# Patient Record
Sex: Female | Born: 1995
Health system: Southern US, Community
[De-identification: ages and names within clinical notes are randomized; demographics above are authoritative.]

## PROBLEM LIST (undated history)

## (undated) ENCOUNTER — Inpatient Hospital Stay (HOSPITAL_COMMUNITY): Payer: Self-pay

## (undated) DIAGNOSIS — O139 Gestational [pregnancy-induced] hypertension without significant proteinuria, unspecified trimester: Secondary | ICD-10-CM

## (undated) DIAGNOSIS — Z8711 Personal history of peptic ulcer disease: Secondary | ICD-10-CM

## (undated) DIAGNOSIS — T4145XA Adverse effect of unspecified anesthetic, initial encounter: Secondary | ICD-10-CM

## (undated) DIAGNOSIS — F410 Panic disorder [episodic paroxysmal anxiety] without agoraphobia: Secondary | ICD-10-CM

## (undated) DIAGNOSIS — Z8619 Personal history of other infectious and parasitic diseases: Secondary | ICD-10-CM

## (undated) DIAGNOSIS — Z8489 Family history of other specified conditions: Secondary | ICD-10-CM

## (undated) DIAGNOSIS — G589 Mononeuropathy, unspecified: Secondary | ICD-10-CM

## (undated) DIAGNOSIS — F329 Major depressive disorder, single episode, unspecified: Secondary | ICD-10-CM

## (undated) DIAGNOSIS — N809 Endometriosis, unspecified: Secondary | ICD-10-CM

## (undated) DIAGNOSIS — K589 Irritable bowel syndrome without diarrhea: Secondary | ICD-10-CM

## (undated) DIAGNOSIS — T8859XA Other complications of anesthesia, initial encounter: Secondary | ICD-10-CM

## (undated) DIAGNOSIS — M6750 Plica syndrome, unspecified knee: Secondary | ICD-10-CM

## (undated) DIAGNOSIS — Z8742 Personal history of other diseases of the female genital tract: Secondary | ICD-10-CM

## (undated) DIAGNOSIS — Z8719 Personal history of other diseases of the digestive system: Secondary | ICD-10-CM

## (undated) DIAGNOSIS — K56609 Unspecified intestinal obstruction, unspecified as to partial versus complete obstruction: Secondary | ICD-10-CM

## (undated) DIAGNOSIS — F419 Anxiety disorder, unspecified: Secondary | ICD-10-CM

## (undated) DIAGNOSIS — F32A Depression, unspecified: Secondary | ICD-10-CM

## (undated) HISTORY — DX: Irritable bowel syndrome, unspecified: K58.9

## (undated) HISTORY — DX: Personal history of other infectious and parasitic diseases: Z86.19

## (undated) HISTORY — DX: Major depressive disorder, single episode, unspecified: F32.9

## (undated) HISTORY — DX: Anxiety disorder, unspecified: F41.9

## (undated) HISTORY — DX: Depression, unspecified: F32.A

## (undated) HISTORY — DX: Endometriosis, unspecified: N80.9

## (undated) HISTORY — DX: Personal history of peptic ulcer disease: Z87.11

## (undated) HISTORY — DX: Personal history of other diseases of the digestive system: Z87.19

---
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
## 2013-02-20 ENCOUNTER — Encounter: Payer: Self-pay | Admitting: Internal Medicine

## 2013-02-20 ENCOUNTER — Ambulatory Visit (INDEPENDENT_AMBULATORY_CARE_PROVIDER_SITE_OTHER): Payer: 59 | Admitting: Internal Medicine

## 2013-02-20 VITALS — BP 122/76 | HR 85 | Temp 98.2°F | Ht 65.0 in | Wt 146.0 lb

## 2013-02-20 DIAGNOSIS — H6690 Otitis media, unspecified, unspecified ear: Secondary | ICD-10-CM

## 2013-02-20 DIAGNOSIS — H6693 Otitis media, unspecified, bilateral: Secondary | ICD-10-CM

## 2013-02-20 MED ORDER — AMOXICILLIN 500 MG PO CAPS: 500.0000 mg | ORAL_CAPSULE | Freq: Three times a day (TID) | ORAL | Status: DC

## 2013-02-20 NOTE — Progress Notes (Signed)
HPI  Pt presents to the clinic today with c/o ear pain, fever, sore throat and body aches x 3 days. She has taken OTC tylenol without much relief. The symptoms seem to be getting worse each day. 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 She has no history of allergies or asthma.  Review of Systems      Past Medical History  Diagnosis Date  . Hx of ovarian cyst     x 2-3  . Gestational hypertension 01/2012    also had postpartum preelampsia  . Plica of knee 08/2012    medial plica left knee  . Migraine     Family History  Problem Relation Age of Onset  . Hypertension Mother   . Heart disease Mother     MI  . Anesthesia problems Mother     post-op N/V  . Asthma Sister   . Asthma Brother   . Diabetes Maternal Aunt     History   Social History  . Marital Status: Single    Spouse Name: N/A    Number of Children: N/A  . Years of Education: N/A   Occupational History  . Not on file.   Social History Main Topics  . Smoking status: Never Smoker   . Smokeless tobacco: Never Used  . Alcohol Use: No  . Drug Use: No  . Sexually Active: Yes    Birth Control/ Protection: None     Comment: last night   Other Topics Concern  . Not on file   Social History Narrative  . No narrative on file    Allergies  Allergen Reactions  . Cefuroxime Axetil Rash     Constitutional: Positive headache, fatigue and fever. Denies abrupt weight changes.  HEENT:  Positive ear pain and sore throat. Denies eye redness, eye pain, pressure behind the eyes, facial pain, nasal congestion, ringing in the ears, wax buildup, runny nose or bloody nose. Respiratory: Positive cough. Denies difficulty breathing or shortness of breath.  Cardiovascular: Denies chest pain, chest tightness, palpitations or swelling in the hands or feet.   No other specific complaints in a complete review of systems (except as listed in HPI above).  Objective:   BP 122/76   Pulse 85  Temp(Src) 98.2 F (36.8 C) (Oral)  Ht 5\' 5"  (1.651 m)  Wt 146 lb (66.225 kg)  BMI 24.3 kg/m2  SpO2 98% Wt Readings from Last 3 Encounters:  02/20/13 146 lb (66.225 kg) (80%*, Z = 0.86)  12/25/12 146 lb 1.9 oz (66.28 kg) (81%*, Z = 0.88)  08/17/12 147 lb (66.679 kg) (83%*, Z = 0.94)   * Growth percentiles are based on CDC 2-20 Years data.     General: Appears her stated age, well developed, well nourished in NAD. HEENT: Head: normal shape and size; Eyes: sclera white, no icterus, conjunctiva pink, PERRLA and EOMs intact; Ears: Tm's red and bulging, distorted light reflex + effusion, cotton impaction in bilateral ears; Nose: mucosa pink and moist, septum midline; Throat/Mouth: + PND. Teeth present, mucosa erythematous and moist, no exudate noted, no lesions or ulcerations noted.  Neck: Mild tonsillar lymphadenopathy. Neck supple, trachea midline. No massses, lumps or thyromegaly present.  Cardiovascular: Normal rate and rhythm. S1,S2 noted.  No murmur, rubs or gallops noted. No JVD or BLE edema. No carotid bruits noted. Pulmonary/Chest: Normal effort and positive vesicular breath sounds. No respiratory distress. No wheezes, rales or ronchi noted.  Assessment & Plan:   Bilateral Otitis media with effustion, new onset:  Get some rest and drink plenty of water Do salt water gargles for the sore throat eRx for Amoxicillin BID x 10 day Continue tylenol or ibuprofen for fever/body aches  Call me if you need a referral to ENT to have the cotton removed from your ears  RTC as needed or if symptoms persist.

## 2013-02-20 NOTE — Patient Instructions (Signed)

## 2013-04-05 ENCOUNTER — Encounter: Payer: 59 | Admitting: Internal Medicine

## 2013-04-22 ENCOUNTER — Encounter: Payer: Self-pay | Admitting: Internal Medicine

## 2013-04-22 ENCOUNTER — Ambulatory Visit (INDEPENDENT_AMBULATORY_CARE_PROVIDER_SITE_OTHER): Payer: 59 | Admitting: Internal Medicine

## 2013-04-22 ENCOUNTER — Telehealth: Payer: Self-pay | Admitting: Internal Medicine

## 2013-04-22 VITALS — BP 126/62 | HR 87 | Temp 98.4°F

## 2013-04-22 DIAGNOSIS — R350 Frequency of micturition: Secondary | ICD-10-CM

## 2013-04-22 DIAGNOSIS — R3915 Urgency of urination: Secondary | ICD-10-CM

## 2013-04-22 DIAGNOSIS — R300 Dysuria: Secondary | ICD-10-CM

## 2013-04-22 LAB — POCT URINALYSIS DIPSTICK
Glucose, UA: NEGATIVE
Leukocytes, UA: NEGATIVE
Nitrite, UA: NEGATIVE
Urobilinogen, UA: 0.2

## 2013-04-22 MED ORDER — NITROFURANTOIN MONOHYD MACRO 100 MG PO CAPS: 100.0000 mg | ORAL_CAPSULE | Freq: Two times a day (BID) | ORAL | Status: DC

## 2013-04-22 NOTE — Addendum Note (Signed)
Addended by: Brenton Grills C on: 04/22/2013 11:42 AM   Modules accepted: Orders

## 2013-04-22 NOTE — Telephone Encounter (Signed)
Ok with me 

## 2013-04-22 NOTE — Telephone Encounter (Signed)
The patient is hoping to switch her PCP from Dr.John to [facility].  Is this okay?   Pt callback - [phone]

## 2013-04-22 NOTE — Progress Notes (Signed)
HPI  Pt presents to the clinic today with c/o urinary urgency, frequency and dysuria that started 3 days ago. She also has some associated nausea and flank pain. She has taken Azo OTC which has helped with the burning. She denies fever, chills or body aches. She has increased her fluids. She denies vaginal discharge, pain, odor or abnormal uterine bleeding.   Review of Systems  Past Medical History  Diagnosis Date  . Hx of ovarian cyst     x 2-3  . Gestational hypertension 01/2012    also had postpartum preelampsia  . Plica of knee 08/2012    medial plica left knee  . Migraine     Family History  Problem Relation Age of Onset  . Hypertension Mother   . Heart disease Mother     MI  . Anesthesia problems Mother     post-op N/V  . Asthma Sister   . Asthma Brother   . Diabetes Maternal Aunt     History   Social History  . Marital Status: Single    Spouse Name: N/A    Number of Children: N/A  . Years of Education: N/A   Occupational History  . Not on file.   Social History Main Topics  . Smoking status: Never Smoker   . Smokeless tobacco: Never Used  . Alcohol Use: No  . Drug Use: No  . Sexually Active: Yes    Birth Control/ Protection: None     Comment: last night   Other Topics Concern  . Not on file   Social History Narrative  . No narrative on file    Allergies  Allergen Reactions  . Cefuroxime Axetil Rash    Constitutional: Denies fever, malaise, fatigue, headache or abrupt weight changes.   GU: Pt reports urgency, frequency and pain with urination. Denies burning sensation, blood in urine, odor or discharge. Skin: Denies redness, rashes, lesions or ulcercations.   No other specific complaints in a complete review of systems (except as listed in HPI above).    Objective:   Physical Exam  BP 126/62  Pulse 87  Temp(Src) 98.4 F (36.9 C) (Oral)  SpO2 97%  LMP 04/06/2013 Wt Readings from Last 3 Encounters:  02/20/13 146 lb (66.225 kg) (80%*, Z  = 0.86)  12/25/12 146 lb 1.9 oz (66.28 kg) (81%*, Z = 0.88)  08/17/12 147 lb (66.679 kg) (83%*, Z = 0.94)   * Growth percentiles are based on CDC 2-20 Years data.    General: Appears her stated age, well developed, well nourished in NAD. Cardiovascular: Normal rate and rhythm. S1,S2 noted.  No murmur, rubs or gallops noted. No JVD or BLE edema. No carotid bruits noted. Pulmonary/Chest: Normal effort and positive vesicular breath sounds. No respiratory distress. No wheezes, rales or ronchi noted.  Abdomen: Soft and nontender. Normal bowel sounds, no bruits noted. No distention or masses noted. Liver, spleen and kidneys non palpable. Tender to palpation over the bladder area. No CVA tenderness.      Assessment & Plan:   Urinary urgency, frequency and dysuria, secondary to cystitis  Urinalysis and urine culture eRx sent if for Macrobid 100 mg BID x 5 days eRx sent in for Pyridium 200 mg TID prn Drink plenty of fluids  RTC as needed or if symptoms persist.

## 2013-04-22 NOTE — Patient Instructions (Signed)
Urinary Tract Infection Urinary tract infections (UTIs) can develop anywhere along your urinary tract. Your urinary tract is your body's drainage system for removing wastes and extra water. Your urinary tract includes two kidneys, two ureters, a bladder, and a urethra. Your kidneys are a pair of bean-shaped organs. Each kidney is about the size of your fist. They are located below your ribs, one on each side of your spine. CAUSES Infections are caused by microbes, which are microscopic organisms, including fungi, viruses, and bacteria. These organisms are so small that they can only be seen through a microscope. Bacteria are the microbes that most commonly cause UTIs. SYMPTOMS  Symptoms of UTIs may vary by age and gender of the patient and by the location of the infection. Symptoms in young women typically include a frequent and intense urge to urinate and a painful, burning feeling in the bladder or urethra during urination. Older women and men are more likely to be tired, shaky, and weak and have muscle aches and abdominal pain. A fever may mean the infection is in your kidneys. Other symptoms of a kidney infection include pain in your back or sides below the ribs, nausea, and vomiting. DIAGNOSIS To diagnose a UTI, your caregiver will ask you about your symptoms. Your caregiver also will ask to provide a urine sample. The urine sample will be tested for bacteria and white blood cells. White blood cells are made by your body to help fight infection. TREATMENT  Typically, UTIs can be treated with medication. Because most UTIs are caused by a bacterial infection, they usually can be treated with the use of antibiotics. The choice of antibiotic and length of treatment depend on your symptoms and the type of bacteria causing your infection. HOME CARE INSTRUCTIONS  If you were prescribed antibiotics, take them exactly as your caregiver instructs you. Finish the medication even if you feel better after you  have only taken some of the medication.  Drink enough water and fluids to keep your urine clear or pale yellow.  Avoid caffeine, tea, and carbonated beverages. They tend to irritate your bladder.  Empty your bladder often. Avoid holding urine for long periods of time.  Empty your bladder before and after sexual intercourse.  After a bowel movement, women should cleanse from front to back. Use each tissue only once. SEEK MEDICAL CARE IF:   You have back pain.  You develop a fever.  Your symptoms do not begin to resolve within 3 days. SEEK IMMEDIATE MEDICAL CARE IF:   You have severe back pain or lower abdominal pain.  You develop chills.  You have nausea or vomiting.  You have continued burning or discomfort with urination. MAKE SURE YOU:   Understand these instructions.  Will watch your condition.  Will get help right away if you are not doing well or get worse. Document Released: 08/31/2005 Document Revised: 05/22/2012 Document Reviewed: 12/30/2011 [employer] Patient Information 2013 [employer].  

## 2013-04-25 ENCOUNTER — Encounter (HOSPITAL_COMMUNITY): Payer: Self-pay | Admitting: *Deleted

## 2013-04-25 ENCOUNTER — Emergency Department (HOSPITAL_COMMUNITY)
Admission: EM | Admit: 2013-04-25 | Discharge: 2013-04-25 | Disposition: A | Discharge status: Home / Self Care | Payer: 59 | Source: Home / Self Care | Attending: Emergency Medicine | Admitting: Emergency Medicine

## 2013-04-25 DIAGNOSIS — H61899 Other specified disorders of external ear, unspecified ear: Secondary | ICD-10-CM

## 2013-04-25 DIAGNOSIS — H61892 Other specified disorders of left external ear: Secondary | ICD-10-CM

## 2013-04-25 MED ORDER — HYDROCODONE-ACETAMINOPHEN 5-325 MG PO TABS
ORAL_TABLET | ORAL | Status: AC
  Filled 2013-04-25: qty 1

## 2013-04-25 MED ORDER — AMOXICILLIN-POT CLAVULANATE 875-125 MG PO TABS: 1.0000 | ORAL_TABLET | Freq: Two times a day (BID) | ORAL | Status: DC

## 2013-04-25 MED ORDER — HYDROCODONE-ACETAMINOPHEN 5-325 MG PO TABS
1.0000 | ORAL_TABLET | Freq: Once | ORAL | Status: AC
  Administered 2013-04-25: 1 via ORAL

## 2013-04-25 MED ORDER — HYDROCODONE-ACETAMINOPHEN 5-325 MG PO TABS: ORAL_TABLET | ORAL | Status: DC

## 2013-04-25 MED ORDER — IBUPROFEN 800 MG PO TABS
800.0000 mg | ORAL_TABLET | Freq: Once | ORAL | Status: AC
  Administered 2013-04-25: 800 mg via ORAL

## 2013-04-25 MED ORDER — ANTIPYRINE-BENZOCAINE 5.4-1.4 % OT SOLN: 3.0000 [drp] | OTIC | Status: DC | PRN

## 2013-04-25 MED ORDER — IBUPROFEN 800 MG PO TABS
ORAL_TABLET | ORAL | Status: AC
  Filled 2013-04-25: qty 1

## 2013-04-25 NOTE — ED Provider Notes (Signed)
Chief Complaint:   Chief Complaint  Patient presents with  . Ear Laceration    History of Present Illness:   [name] Sanchez is an 17 year old female who's infant son stuck a plastic hair headband into her left ear around 4:30 this afternoon. The ear canal blood little bit, but the bleeding has now stopped. Her hearing is normal and she has pain in the ear canal which is worse with any touching or movement of the external ear. She denies fever, chills, nasal congestion, rhinorrhea, or sore throat.  Review of Systems:  Other than noted above, the patient denies any of the following symptoms: Systemic:  No fevers, chills, sweats, weight loss or gain, fatigue, or tiredness. Eye:  No redness, pain, discharge, itching, blurred vision, or diplopia. ENT:  No headache, nasal congestion, sneezing, itching, epistaxis, ear pain, congestion, decreased hearing, ringing in ears, vertigo, or tinnitus.  No oral lesions, sore throat, pain on swallowing, or hoarseness. Neck:  No mass, tenderness or adenopathy. Lungs:  No coughing, wheezing, or shortness of breath. Skin:  No rash or itching.  PMFSH:  Past medical history, family history, social history, meds, and allergies were reviewed. She is allergic to Ceftin. She is on birth control pills and nitrofurantoin for urinary tract infection.  Physical Exam:   Vital signs:  BP 142/102  Pulse 91  Temp(Src) 98.2 F (36.8 C) (Oral)  Resp 20  SpO2 100%  LMP 04/06/2013 General:  Alert and oriented.  In no distress.  Skin warm and dry. Eye:  PERRL, full EOMs, lids and conjunctiva normal.   ENT:  The external ear was normal. She has some crusted blood in the external ear canal inferiorly. The ear was very tender to touch I did not attempt to irrigate the ear since this would cause her severe pain. The tympanic membrane was intact. The right ear canal and TM are normal.  Nasal mucosa not congested and without drainage.  Mucous membranes moist, no oral lesions, normal  dentition, pharynx clear.  No cranial or facial pain to palplation. Neck:  Supple, full ROM.  No adenopathy, tenderness or mass.  Thyroid normal. Lungs:  Breath sounds clear and equal bilaterally.  No wheezes, rales or rhonchi. Heart:  Rhythm regular, without extrasystoles.  No gallops or murmers. Skin:  Clear, warm and dry.   Course in Urgent Care Center:   3 drops of Auralgan was instilled into the ear canal and a cotton ball was put in place.  Assessment:  The encounter diagnosis was Irritation of external ear canal, left.  No evidence of injury to the tympanic membrane. This appears to be a small scratch to the external ear. Should heal up well, but infection is always a possibility.  Plan:   1.  The following meds were prescribed:   New Prescriptions   AMOXICILLIN-CLAVULANATE (AUGMENTIN) 875-125 MG PER TABLET    Take 1 tablet by mouth 2 (two) times daily.   ANTIPYRINE-BENZOCAINE (AURALGAN) OTIC SOLUTION    Place 3 drops into the left ear every 2 (two) hours as needed for pain.   HYDROCODONE-ACETAMINOPHEN (NORCO/VICODIN) 5-325 MG PER TABLET    1 to 2 tabs every 4 to 6 hours as needed for pain.   2.  The patient was instructed in symptomatic care and handouts were given. She was instructed to instill 3 drops of half strength peroxide and warm water into the ear, followed by warm water flush to the ear canal. She should not swim for about the next week  and return in 3 or 4 days if no improvement in the pain. 3.  The patient was told to return if becoming worse in any way, if no better in 3 or 4 days, and given some red flag symptoms such as worsening pain, fever, or difficulty hearing that would indicate earlier return. 4.  Follow up here if no better in 3 or 4 days.    Reuben Likes, MD 04/25/13 [phone]

## 2013-04-25 NOTE — ED Notes (Signed)
Pt   Reports  Her    Child    Accidentally    Pushed the  Sharp      Portion        Of a  Headband  Into    Her l  Ear  Causing a  Lac with  Blood  Present in the  Canal   -  Pt  denys  Any loss  Of  Hearing in the  Affected  Ear

## 2013-05-22 ENCOUNTER — Encounter: Payer: Self-pay | Admitting: *Deleted

## 2013-05-22 ENCOUNTER — Encounter: Payer: Self-pay | Admitting: Internal Medicine

## 2013-05-22 ENCOUNTER — Ambulatory Visit (INDEPENDENT_AMBULATORY_CARE_PROVIDER_SITE_OTHER): Payer: 59 | Admitting: Internal Medicine

## 2013-05-22 VITALS — BP 122/72 | HR 78 | Temp 98.0°F

## 2013-05-22 DIAGNOSIS — J309 Allergic rhinitis, unspecified: Secondary | ICD-10-CM

## 2013-05-22 DIAGNOSIS — L301 Dyshidrosis [pompholyx]: Secondary | ICD-10-CM

## 2013-05-22 MED ORDER — TRIAMCINOLONE ACETONIDE 0.1 % EX CREA: TOPICAL_CREAM | Freq: Two times a day (BID) | CUTANEOUS | Status: DC

## 2013-05-22 MED ORDER — METHYLPREDNISOLONE ACETATE 80 MG/ML IJ SUSP
80.0000 mg | Freq: Once | INTRAMUSCULAR | Status: AC
  Administered 2013-05-22: 80 mg via INTRAMUSCULAR

## 2013-05-22 NOTE — Progress Notes (Signed)
HPI  Pt presents to the clinic today with c/o coughing, sore throat, fatigue, headache, nasal congestion, sinus pain and pressure. This is a recurrent issue for her. She has had sinus issues in the past. This started 2 days ago. She has taken Zyrtec and Advil sinus. She is having green nasal drainage and green sputum production. She does have a history of allergies but not asthma. She has not had sick contacts.  Review of Systems    Past Medical History  Diagnosis Date  . Hx of ovarian cyst     x 2-3  . Gestational hypertension 01/2012    also had postpartum preelampsia  . Plica of knee 08/2012    medial plica left knee  . Migraine     Family History  Problem Relation Age of Onset  . Hypertension Mother   . Heart disease Mother     MI  . Anesthesia problems Mother     post-op N/V  . Asthma Sister   . Asthma Brother   . Diabetes Maternal Aunt     History   Social History  . Marital Status: Single    Spouse Name: N/A    Number of Children: N/A  . Years of Education: N/A   Occupational History  . Not on file.   Social History Main Topics  . Smoking status: Never Smoker   . Smokeless tobacco: Never Used  . Alcohol Use: No  . Drug Use: No  . Sexually Active: Yes    Birth Control/ Protection: None     Comment: last night   Other Topics Concern  . Not on file   Social History Narrative  . No narrative on file    Allergies  Allergen Reactions  . Cefuroxime Axetil Rash     Constitutional: Positive headache, fatigue. Denies fever or abrupt weight changes.  Skin: pt reports dry skin and cracking of hands. HEENT:  Positive eye pain, pressure behind the eyes, facial pain, nasal congestion and sore throat. Denies eye redness, ear pain, ringing in the ears, wax buildup, runny nose or bloody nose. Respiratory: Positive cough and thick green sputum production. Denies difficulty breathing or shortness of breath.  Cardiovascular: Denies chest pain, chest tightness,  palpitations or swelling in the hands or feet.   No other specific complaints in a complete review of systems (except as listed in HPI above).  Objective:    BP 122/72  Pulse 78  Temp(Src) 98 F (36.7 C) (Oral)  SpO2 97%  LMP 04/06/2013 Wt Readings from Last 3 Encounters:  02/20/13 146 lb (66.225 kg) (80%*, Z = 0.86)  12/25/12 146 lb 1.9 oz (66.28 kg) (81%*, Z = 0.88)  08/17/12 147 lb (66.679 kg) (83%*, Z = 0.94)   * Growth percentiles are based on CDC 2-20 Years data.    General: Appears her stated age, well developed, well nourished in NAD. Skin: eczema noted in between digits on bilateral hands. HEENT: Head: normal shape and size; Eyes: sclera white, no icterus, conjunctiva pink, PERRLA and EOMs intact; Ears: Tm's gray and intact, normal light reflex; Nose: mucosa pink and moist, septum midline; Throat/Mouth: + PND. Teeth present, mucosa pink and moist, no exudate noted, no lesions or ulcerations noted.  Neck: Mild cervical lymphadenopathy. Neck supple, trachea midline. No massses, lumps or thyromegaly present.  Cardiovascular: Normal rate and rhythm. S1,S2 noted.  No murmur, rubs or gallops noted. No JVD or BLE edema. No carotid bruits noted. Pulmonary/Chest: Normal effort and positive vesicular breath sounds.  No respiratory distress. No wheezes, rales or ronchi noted.      Assessment & Plan:   Likely allergic rhinitis  Continue zyrtec Will give 80 mg Depo IM Reassurance given that this will likely resolve  Dishydrotic eczema of hands, new onset:  eRx for triamcinolone cream F/u with dermatology  RTC as needed or if symptoms persist.

## 2013-05-22 NOTE — Patient Instructions (Signed)
Allergic Rhinitis Allergic rhinitis is when the mucous membranes in the nose respond to allergens. Allergens are particles in the air that cause your body to have an allergic reaction. This causes you to release allergic antibodies. Through a chain of events, these eventually cause you to release histamine into the blood stream (hence the use of antihistamines). Although meant to be protective to the body, it is this release that causes your discomfort, such as frequent sneezing, congestion and an itchy runny nose.  CAUSES  The pollen allergens may come from grasses, trees, and weeds. This is seasonal allergic rhinitis, or "hay fever." Other allergens cause year-round allergic rhinitis (perennial allergic rhinitis) such as house dust mite allergen, pet dander and mold spores.  SYMPTOMS   Nasal stuffiness (congestion).  Runny, itchy nose with sneezing and tearing of the eyes.  There is often an itching of the mouth, eyes and ears. It cannot be cured, but it can be controlled with medications. DIAGNOSIS  If you are unable to determine the offending allergen, skin or blood testing may find it. TREATMENT   Avoid the allergen.  Medications and allergy shots (immunotherapy) can help.  Hay fever may often be treated with antihistamines in pill or nasal spray forms. Antihistamines block the effects of histamine. There are over-the-counter medicines that may help with nasal congestion and swelling around the eyes. Check with your caregiver before taking or giving this medicine. If the treatment above does not work, there are many new medications your caregiver can prescribe. Stronger medications may be used if initial measures are ineffective. Desensitizing injections can be used if medications and avoidance fails. Desensitization is when a patient is given ongoing shots until the body becomes less sensitive to the allergen. Make sure you follow up with your caregiver if problems continue. SEEK MEDICAL  CARE IF:   You develop fever (more than 100.5 F (38.1 C).  You develop a cough that does not stop easily (persistent).  You have shortness of breath.  You start wheezing.  Symptoms interfere with normal daily activities. Document Released: 08/16/2001 Document Revised: 02/13/2012 Document Reviewed: 02/25/2009 [employer] Patient Information 2014 [employer].  

## 2013-05-22 NOTE — Addendum Note (Signed)
Addended by: Carin Primrose on: 05/22/2013 04:29 PM   Modules accepted: Orders

## 2013-06-27 ENCOUNTER — Telehealth: Payer: Self-pay | Admitting: *Deleted

## 2013-06-27 ENCOUNTER — Ambulatory Visit (INDEPENDENT_AMBULATORY_CARE_PROVIDER_SITE_OTHER): Payer: 59 | Admitting: Internal Medicine

## 2013-06-27 ENCOUNTER — Encounter: Payer: Self-pay | Admitting: Internal Medicine

## 2013-06-27 VITALS — BP 136/86 | HR 98 | Temp 98.4°F | Wt 138.0 lb

## 2013-06-27 DIAGNOSIS — L301 Dyshidrosis [pompholyx]: Secondary | ICD-10-CM

## 2013-06-27 MED ORDER — FLUOCINOLONE ACETONIDE 0.01 % EX CREA: TOPICAL_CREAM | Freq: Two times a day (BID) | CUTANEOUS | Status: DC

## 2013-06-27 NOTE — Patient Instructions (Signed)

## 2013-06-27 NOTE — Telephone Encounter (Signed)
Monica, pharmacist called requesting clarification on medication.  Rx written for Fluocinolone; Rx intended Fluocinonide (Vanos).

## 2013-06-27 NOTE — Progress Notes (Signed)
Subjective:    Patient ID: [name] Sanchez, female    DOB: 1996-05-01, 17 y.o.   MRN: [id number]  HPI  Pt presents to the clinic today with c/o right hand rash that itches and burns. This started about 2 months ago. She was seen by a dermatologist for this. They told her she was allergic to water. They did not offer her any treatment for this. She does work in a nursing home. She does wear vinyl non powdered non latex gloves. She does wash her hands frequently. Sometimes, they get so dry, they open up. She is concerned that she is going to get an infection. She has previously tried triamcinolone cream on it but that makes it burn even worse.  Review of Systems      Past Medical History  Diagnosis Date  . Hx of ovarian cyst     x 2-3  . Gestational hypertension 01/2012    also had postpartum preelampsia  . Plica of knee 08/2012    medial plica left knee  . Migraine     Current Outpatient Prescriptions  Medication Sig Dispense Refill  . norgestrel-ethinyl estradiol (LO/OVRAL,CRYSELLE) 0.3-30 MG-MCG tablet Take 1 tablet by mouth daily.      . pantoprazole (PROTONIX) 40 MG tablet Take 1 tablet (40 mg total) by mouth daily.  30 tablet  3  . triamcinolone cream (KENALOG) 0.1 % Apply topically 2 (two) times daily.  30 g  0   No current facility-administered medications for this visit.    Allergies  Allergen Reactions  . Cefuroxime Axetil Rash    Family History  Problem Relation Age of Onset  . Hypertension Mother   . Heart disease Mother     MI  . Anesthesia problems Mother     post-op N/V  . Asthma Sister   . Asthma Brother   . Diabetes Maternal Aunt     History   Social History  . Marital Status: Single    Spouse Name: N/A    Number of Children: N/A  . Years of Education: N/A   Occupational History  . Not on file.   Social History Main Topics  . Smoking status: Never Smoker   . Smokeless tobacco: Never Used  . Alcohol Use: No  . Drug Use: No  . Sexually Active:  Yes    Birth Control/ Protection: None     Comment: last night   Other Topics Concern  . Not on file   Social History Narrative  . No narrative on file     Constitutional: Denies fever, malaise, fatigue, headache or abrupt weight changes.  Musculoskeletal:  Denies decrease in range of motion, difficulty with gait, muscle pain or joint pain and swelling.  Skin: Pt reports dry cracked skin on her right hand. Denies lesions or ulcercations.    No other specific complaints in a complete review of systems (except as listed in HPI above).  Objective:   Physical Exam   BP 136/86  Pulse 98  Temp(Src) 98.4 F (36.9 C) (Oral)  Wt 138 lb (62.596 kg)  BMI 22.96 kg/m2  SpO2 98% Wt Readings from Last 3 Encounters:  06/27/13 138 lb (62.596 kg) (71%*, Z = 0.55)  02/20/13 146 lb (66.225 kg) (80%*, Z = 0.86)  12/25/12 146 lb 1.9 oz (66.28 kg) (81%*, Z = 0.88)   * Growth percentiles are based on CDC 2-20 Years data.    General: Appears her stated age, well developed, well nourished in NAD. Skin: Warm,  dry and intact. Dyshidrotic eczema noted on right hand. Cardiovascular: Normal rate and rhythm. S1,S2 noted.  No murmur, rubs or gallops noted. No JVD or BLE edema. No carotid bruits noted. Pulmonary/Chest: Normal effort and positive vesicular breath sounds. No respiratory distress. No wheezes, rales or ronchi noted.     BMET    Component Value Date/Time   NA 136 12/25/2012 1420   K 3.9 12/25/2012 1420   CL 105 12/25/2012 1420   CO2 26 12/25/2012 1420   GLUCOSE 88 12/25/2012 1420   BUN 8 12/25/2012 1420   CREATININE 0.7 12/25/2012 1420   CALCIUM 9.3 12/25/2012 1420   GFRNONAA NOT CALCULATED 01/25/2012 0540   GFRAA NOT CALCULATED 01/25/2012 0540    Lipid Panel     Component Value Date/Time   CHOL 174 04/04/2012 1410   TRIG 62.0 04/04/2012 1410   HDL 58.30 04/04/2012 1410   CHOLHDL 3 04/04/2012 1410   VLDL 12.4 04/04/2012 1410   LDLCALC 103* 04/04/2012 1410    CBC    Component Value  Date/Time   WBC 6.2 12/25/2012 1420   RBC 4.34 12/25/2012 1420   HGB 13.6 12/25/2012 1420   HCT 39.1 12/25/2012 1420   PLT 214.0 12/25/2012 1420   MCV 90.2 12/25/2012 1420   MCH 32.4 01/24/2012 0510   MCHC 34.8 12/25/2012 1420   RDW 12.2 12/25/2012 1420   LYMPHSABS 2.0 04/04/2012 1410   MONOABS 0.4 04/04/2012 1410   EOSABS 0.1 04/04/2012 1410   BASOSABS 0.0 04/04/2012 1410    Hgb A1C No results found for this basename: HGBA1C        Assessment & Plan:   Dyshidrotic eczema right hand:  Avoid washing with hot water Use your own soap for sensitive skin while at work- no fragrances eRx for fluocinolone cream BID x 4 weeks Keep your hand in a glove  RTC as needed or if symptoms persist or worsen or if you notice any s/s of infection

## 2013-09-26 ENCOUNTER — Ambulatory Visit (INDEPENDENT_AMBULATORY_CARE_PROVIDER_SITE_OTHER): Payer: 59 | Admitting: Internal Medicine

## 2013-09-26 ENCOUNTER — Encounter: Payer: Self-pay | Admitting: Internal Medicine

## 2013-09-26 VITALS — BP 110/78 | HR 86 | Temp 97.8°F | Wt 143.4 lb

## 2013-09-26 DIAGNOSIS — H6692 Otitis media, unspecified, left ear: Secondary | ICD-10-CM

## 2013-09-26 DIAGNOSIS — H6690 Otitis media, unspecified, unspecified ear: Secondary | ICD-10-CM

## 2013-09-26 MED ORDER — AMOXICILLIN 500 MG PO CAPS: 500.0000 mg | ORAL_CAPSULE | Freq: Three times a day (TID) | ORAL | Status: DC

## 2013-09-26 NOTE — Progress Notes (Signed)
Subjective:    Patient ID: [name] Sanchez, female    DOB: [birth year], 17 y.o.   MRN: [id number]  HPI  Pt presents to the clinic today with c/o left ear pain. This started about 3 days ago. She describes the pain as sharp and stabbing. She denies fever, chills or body aches. She has taken BC powder and Claritin.  Review of Systems      Past Medical History  Diagnosis Date  . Hx of ovarian cyst     x 2-3  . Gestational hypertension 01/2012    also had postpartum preelampsia  . Plica of knee 08/2012    medial plica left knee  . Migraine     No current outpatient prescriptions on file.   No current facility-administered medications for this visit.    Allergies  Allergen Reactions  . Cefuroxime Axetil Rash    Family History  Problem Relation Age of Onset  . Hypertension Mother   . Heart disease Mother     MI  . Anesthesia problems Mother     post-op N/V  . Asthma Sister   . Asthma Brother   . Diabetes Maternal Aunt     History   Social History  . Marital Status: Single    Spouse Name: N/A    Number of Children: N/A  . Years of Education: N/A   Occupational History  . Not on file.   Social History Main Topics  . Smoking status: Never Smoker   . Smokeless tobacco: Never Used  . Alcohol Use: No  . Drug Use: No  . Sexual Activity: Yes    Birth Control/ Protection: None     Comment: last night   Other Topics Concern  . Not on file   Social History Narrative  . No narrative on file     Constitutional: Denies fever, malaise, fatigue, headache or abrupt weight changes.  HEENT: Pt reports left ear pain. Denies eye pain, eye redness, ringing in the ears, wax buildup, runny nose, nasal congestion, bloody nose, or sore throat.   No other specific complaints in a complete review of systems (except as listed in HPI above).  Objective:   Physical Exam   BP 110/78  Pulse 86  Temp(Src) 97.8 F (36.6 C) (Oral)  Wt 143 lb 6.4 oz (65.046 kg)  BMI 23.86  kg/m2  SpO2 99% Wt Readings from Last 3 Encounters:  09/26/13 143 lb 6.4 oz (65.046 kg) (76%*, Z = 0.71)  06/27/13 138 lb (62.596 kg) (71%*, Z = 0.55)  02/20/13 146 lb (66.225 kg) (80%*, Z = 0.86)   * Growth percentiles are based on CDC 2-20 Years data.    General: Appears her stated age, well developed, well nourished in NAD. HEENT: Head: normal shape and size; Eyes: sclera white, no icterus, conjunctiva pink, PERRLA and EOMs intact; Left Ears: Tm's red but intact, distorted light reflex, + effusion; Nose: mucosa pink and moist, septum midline; Throat/Mouth: Teeth present, mucosa pink and moist, no exudate, lesions or ulcerations noted Cardiovascular: Normal rate and rhythm. S1,S2 noted.  No murmur, rubs or gallops noted. No JVD or BLE edema. No carotid bruits noted. Pulmonary/Chest: Normal effort and positive vesicular breath sounds. No respiratory distress. No wheezes, rales or ronchi noted.    BMET    Component Value Date/Time   NA 136 12/25/2012 1420   K 3.9 12/25/2012 1420   CL 105 12/25/2012 1420   CO2 26 12/25/2012 1420   GLUCOSE 88 12/25/2012 1420  BUN 8 12/25/2012 1420   CREATININE 0.7 12/25/2012 1420   CALCIUM 9.3 12/25/2012 1420   GFRNONAA NOT CALCULATED 01/25/2012 0540   GFRAA NOT CALCULATED 01/25/2012 0540    Lipid Panel     Component Value Date/Time   CHOL 174 04/04/2012 1410   TRIG 62.0 04/04/2012 1410   HDL 58.30 04/04/2012 1410   CHOLHDL 3 04/04/2012 1410   VLDL 12.4 04/04/2012 1410   LDLCALC 103* 04/04/2012 1410    CBC    Component Value Date/Time   WBC 6.2 12/25/2012 1420   RBC 4.34 12/25/2012 1420   HGB 13.6 12/25/2012 1420   HCT 39.1 12/25/2012 1420   PLT 214.0 12/25/2012 1420   MCV 90.2 12/25/2012 1420   MCH 32.4 01/24/2012 0510   MCHC 34.8 12/25/2012 1420   RDW 12.2 12/25/2012 1420   LYMPHSABS 2.0 04/04/2012 1410   MONOABS 0.4 04/04/2012 1410   EOSABS 0.1 04/04/2012 1410   BASOSABS 0.0 04/04/2012 1410    Hgb A1C No results found for this basename: HGBA1C         Assessment & Plan:  Otalgia left ear, secondary to otitis media:  eRx for Amoxicillin TID x 10 days Ibuprofen for pain/fever  RTC as needed or if symptoms persist or worsen

## 2013-09-26 NOTE — Patient Instructions (Signed)

## 2013-10-28 ENCOUNTER — Emergency Department (HOSPITAL_COMMUNITY)
Admission: EM | Admit: 2013-10-28 | Discharge: 2013-10-28 | Disposition: A | Discharge status: Home / Self Care | Payer: 59 | Attending: Emergency Medicine | Admitting: Emergency Medicine

## 2013-10-28 ENCOUNTER — Emergency Department (HOSPITAL_COMMUNITY): Payer: 59

## 2013-10-28 ENCOUNTER — Encounter (HOSPITAL_COMMUNITY): Payer: Self-pay | Admitting: Emergency Medicine

## 2013-10-28 DIAGNOSIS — F419 Anxiety disorder, unspecified: Secondary | ICD-10-CM

## 2013-10-28 DIAGNOSIS — Z8739 Personal history of other diseases of the musculoskeletal system and connective tissue: Secondary | ICD-10-CM | POA: Insufficient documentation

## 2013-10-28 DIAGNOSIS — Z888 Allergy status to other drugs, medicaments and biological substances status: Secondary | ICD-10-CM | POA: Insufficient documentation

## 2013-10-28 DIAGNOSIS — Z8742 Personal history of other diseases of the female genital tract: Secondary | ICD-10-CM | POA: Insufficient documentation

## 2013-10-28 DIAGNOSIS — Z79899 Other long term (current) drug therapy: Secondary | ICD-10-CM | POA: Insufficient documentation

## 2013-10-28 DIAGNOSIS — R55 Syncope and collapse: Secondary | ICD-10-CM | POA: Insufficient documentation

## 2013-10-28 DIAGNOSIS — R000 Tachycardia, unspecified: Secondary | ICD-10-CM | POA: Insufficient documentation

## 2013-10-28 DIAGNOSIS — G43909 Migraine, unspecified, not intractable, without status migrainosus: Secondary | ICD-10-CM | POA: Insufficient documentation

## 2013-10-28 DIAGNOSIS — R9431 Abnormal electrocardiogram [ECG] [EKG]: Secondary | ICD-10-CM | POA: Insufficient documentation

## 2013-10-28 DIAGNOSIS — R42 Dizziness and giddiness: Secondary | ICD-10-CM | POA: Insufficient documentation

## 2013-10-28 DIAGNOSIS — R002 Palpitations: Secondary | ICD-10-CM | POA: Insufficient documentation

## 2013-10-28 DIAGNOSIS — H9319 Tinnitus, unspecified ear: Secondary | ICD-10-CM | POA: Insufficient documentation

## 2013-10-28 DIAGNOSIS — Z3202 Encounter for pregnancy test, result negative: Secondary | ICD-10-CM | POA: Insufficient documentation

## 2013-10-28 DIAGNOSIS — F411 Generalized anxiety disorder: Secondary | ICD-10-CM | POA: Insufficient documentation

## 2013-10-28 DIAGNOSIS — Z8669 Personal history of other diseases of the nervous system and sense organs: Secondary | ICD-10-CM | POA: Insufficient documentation

## 2013-10-28 LAB — BASIC METABOLIC PANEL
BUN: 14 mg/dL (ref 6–23)
Creatinine, Ser: 0.63 mg/dL (ref 0.50–1.10)
GFR calc non Af Amer: >90 mL/min (ref >90)
Glucose, Bld: 101 mg/dL — ABNORMAL HIGH (ref 70–99)
Potassium: 3.5 mEq/L (ref 3.5–5.1)

## 2013-10-28 LAB — URINALYSIS, ROUTINE W REFLEX MICROSCOPIC
Bilirubin Urine: NEGATIVE
Glucose, UA: NEGATIVE mg/dL
Leukocytes, UA: NEGATIVE
Nitrite: NEGATIVE
Specific Gravity, Urine: 1.012 (ref 1.005–1.030)
pH: 7 (ref 5.0–8.0)

## 2013-10-28 LAB — D-DIMER, QUANTITATIVE: D-Dimer, Quant: <0.27 ug/mL-FEU (ref 0.00–0.48)

## 2013-10-28 LAB — CBC
HCT: 38.6 % (ref 36.0–46.0)
Hemoglobin: 13.8 g/dL (ref 12.0–15.0)
MCHC: 35.8 g/dL (ref 30.0–36.0)
MCV: 90.4 fL (ref 78.0–100.0)

## 2013-10-28 MED ORDER — LORAZEPAM 1 MG PO TABS
1.0000 mg | ORAL_TABLET | Freq: Once | ORAL | Status: AC
  Administered 2013-10-28: 1 mg via ORAL
  Filled 2013-10-28: qty 1

## 2013-10-28 NOTE — ED Provider Notes (Signed)
CSN: [id number]     Arrival date & time 10/28/13  1942 History   First MD Initiated Contact with Patient 10/28/13 2053     Chief Complaint  Patient presents with  . Chest Pain  . Migraine  . Tinnitus   (Consider location/radiation/quality/duration/timing/severity/associated sxs/prior Treatment) HPI  This is an 17 year old female who presents with presyncope, chest pain. Patient reports that she was at work earlier today. She's standing up and experienced acute onset of ringing in her ears followed by everything closing in and becoming dark. She did not pass out. She did sit down and was evaluated by her coworkers. She states after that she developed palpitations and anterior chest pain that is nonradiating. She denies any shortness of breath.  Patient denies any recent stressors or anxiety. Patient also reports a history of daily migraines. She currently denies any headache but is frustrated that she has not been able to get her migraines are controlled. She's not on any prophylactic medication and takes Excedrin when needed.  The patient denies any history of blood clots, injury, recent travel, recent hospitalization. She did just come off birth control one month ago.  Past Medical History  Diagnosis Date  . Hx of ovarian cyst     x 2-3  . Gestational hypertension 01/2012    also had postpartum preelampsia  . Plica of knee 08/2012    medial plica left knee  . Migraine    Past Surgical History  Procedure Laterality Date  . Tympanostomy tube placement    . Knee arthroscopy  08/17/2012    Procedure: ARTHROSCOPY KNEE;  Surgeon: Harvie Junior, MD;  Location: [facility];  Service: Orthopedics;  Laterality: Left;  left knee scope with tight lateral retinacular release and plica removal   Family History  Problem Relation Age of Onset  . Hypertension Mother   . Heart disease Mother     MI  . Anesthesia problems Mother     post-op N/V  . Asthma Sister   . Asthma Brother    . Diabetes Maternal Aunt    History  Substance Use Topics  . Smoking status: Never Smoker   . Smokeless tobacco: Never Used  . Alcohol Use: No   OB History   Grav Para Term Preterm Abortions TAB SAB Ect Mult Living   2 1 1  1 1    1      Review of Systems  Constitutional: Negative for fever.  Respiratory: Negative for cough, chest tightness and shortness of breath.   Cardiovascular: Positive for chest pain.  Gastrointestinal: Negative for nausea, vomiting and abdominal pain.  Genitourinary: Negative for dysuria.  Musculoskeletal: Negative for back pain.  Neurological: Positive for dizziness and headaches.       Presyncope  Psychiatric/Behavioral: The patient is not nervous/anxious.   All other systems reviewed and are negative.    Allergies  Cefuroxime axetil  Home Medications   Current Outpatient Rx  Name  Route  Sig  Dispense  Refill  . aspirin-acetaminophen-caffeine (EXCEDRIN MIGRAINE) 250-250-65 MG per tablet   Oral   Take 2 tablets by mouth every 8 (eight) hours as needed for headache or migraine.         . Aspirin-Salicylamide-Caffeine (BC HEADACHE POWDER PO)   Oral   Take 1 packet by mouth daily as needed (for headache).          BP 122/80  Pulse 106  Temp(Src) 98.1 F (36.7 C) (Oral)  Resp 16  Wt 148  lb 8 oz (67.359 kg)  SpO2 100%  LMP 10/14/2013  Breastfeeding? No Physical Exam  Nursing note and vitals reviewed. Constitutional: She is oriented to person, place, and time. She appears well-developed and well-nourished.  Anxious appearing  HENT:  Head: Normocephalic and atraumatic.  Eyes: Pupils are equal, round, and reactive to light.  Neck: Neck supple.  Cardiovascular: Regular rhythm and normal heart sounds.   Tachycardia  Pulmonary/Chest: Effort normal and breath sounds normal. No respiratory distress. She has no wheezes. She exhibits tenderness.  Abdominal: Soft. Bowel sounds are normal. There is no tenderness.  Musculoskeletal: She  exhibits no edema.  Neurological: She is alert and oriented to person, place, and time.  Skin: Skin is warm and dry.  Psychiatric:  Anxious and tearful    ED Course  Procedures (including critical care time) Labs Review Labs Reviewed  BASIC METABOLIC PANEL - Abnormal; Notable for the following:    Glucose, Bld 101 (*)    All other components within normal limits  CBC  D-DIMER, QUANTITATIVE  URINALYSIS, ROUTINE W REFLEX MICROSCOPIC  POCT I-STAT TROPONIN I  POCT PREGNANCY, URINE   Imaging Review Dg Chest 2 View  10/28/2013   CLINICAL DATA:  Chest pain.  History of blood pressure issues.  EXAM: CHEST  2 VIEW  COMPARISON:  03/24/2011  FINDINGS: Normal inspiration. Normal heart size and pulmonary vascularity. No focal airspace disease in the lungs. No blunting of costophrenic angles. No pneumothorax.  IMPRESSION: No active cardiopulmonary disease.   Electronically Signed   By: Burman Nieves M.D.   On: 10/28/2013 23:05    EKG Interpretation    Date/Time:  Monday October 28 2013 19:49:39 EST Ventricular Rate:  103 PR Interval:  124 QRS Duration: 84 QT Interval:  322 QTC Calculation: 421 R Axis:   94 Text Interpretation:  Sinus tachycardia Right atrial enlargement Rightward axis Abnormal ECG Confirmed by HORTON  MD, COURTNEY ([id number]) on 10/28/2013 11:44:30 PM            MDM   1. Near syncope   2. Anxiety    This is an 17 year old female who presents with presyncope and chest pain. She is nontoxic-appearing and her vital signs are notable for tachycardia. She is low risk for blood clot. D-dimer was sent. Orthostatics are negative. Patient appears anxious although she denies stressors or anxiety. Lab work is largely unremarkable including urinalysis, CBC, BMP, and urine pregnancy. D-dimer is negative. EKG doesn't show any evidence of QT prolongation or arrhythmia. I discussed the results with her patient and her mother. At this time I feel the patient can be safely  discharged home with primary care followup. Regarding prophylaxis for her headaches, I have recommended she pursue referral to neurology. Patient has seen a neurologist in the past but "I was not happy with him."  She will see her primary care physician for further recommendations.  After history, exam, and medical workup I feel the patient has been appropriately medically screened and is safe for discharge home. Pertinent diagnoses were discussed with the patient. Patient was given return precautions.     Shon Baton, MD 10/28/13 2350

## 2013-10-28 NOTE — ED Notes (Signed)
Horton, MD at bedside.  

## 2013-10-28 NOTE — ED Notes (Signed)
Patient stats unable to void at this time

## 2013-10-28 NOTE — ED Notes (Signed)
Per Horton MD, this RN is to hold the scheduled Ativan until the pt's pregnancy test has resulted.

## 2013-10-28 NOTE — ED Notes (Signed)
Patient presents stating that she was at work and felt like she was going to pass out.  Her chest started hurting, left arm became heavy, ear ringing and migraine for the last week

## 2013-10-28 NOTE — ED Notes (Signed)
Pt reports sudden onset of chest pain and heaviness between 4 and 5 PM this evening. Pt also reports a migraine headache x 1 week. Pt's mother mentioned that the pt occasionally has a rapid heart rate that the pt's doctor has never addressed.

## 2013-11-01 ENCOUNTER — Encounter: Payer: Self-pay | Admitting: Internal Medicine

## 2013-11-01 ENCOUNTER — Ambulatory Visit (INDEPENDENT_AMBULATORY_CARE_PROVIDER_SITE_OTHER): Payer: 59 | Admitting: Internal Medicine

## 2013-11-01 VITALS — BP 108/68 | HR 72 | Temp 97.0°F | Ht 64.0 in | Wt 148.2 lb

## 2013-11-01 DIAGNOSIS — F411 Generalized anxiety disorder: Secondary | ICD-10-CM

## 2013-11-01 DIAGNOSIS — G43909 Migraine, unspecified, not intractable, without status migrainosus: Secondary | ICD-10-CM

## 2013-11-01 MED ORDER — FLUOXETINE HCL 10 MG PO TABS: 10.0000 mg | ORAL_TABLET | Freq: Every day | ORAL | Status: DC

## 2013-11-01 NOTE — Assessment & Plan Note (Signed)
Situational Support offered today Will start prozac 10 mg daily with supplemental xanax (she already has a RX from Dr. Jonny Ruiz) Let me know how you are doing in 2-4 weeks

## 2013-11-01 NOTE — Progress Notes (Signed)
Subjective:    Patient ID: [name] Sanchez, female    DOB: July 28, 1996, 17 y.o.   MRN: [id number]  HPI  Pt presents to the clinic today for ED followup. She was evaluated on 10/28/13 for a near syncopal episode that occurred while she was at work. She did have some chest tightness that was non radiating. She denied dizziness, shortness of breath or loss of consciousness. She was very tearful and anxious while undergoing evaluation. ECG was normal. Lab values were all normal including d-dimer. They thought this episode was related to anxiety/stress. She was discharged and asked to follow up with her PCP. Since that time, she has not had recurrent symptoms. She does report that she is very stressed out secondary to her social situation. She has a small child and is living with her in-laws. She is not very happy about this. She has gotten to the point where she doesn't even want to go home. She does take xanax prn but she thinks it is time to start a daily medication. Additionally, she has also been having migraines daily. She takes Excedrin as needed. She has never been on prophylactic medication for her migraines. She did mention this at the ER and they advised her to followup with her PCP for a neurology referral.She has been having migraines since the age of 89. She has been seen by a neurologist in the past but didn't really have a good relationship with him. She would like referral to another neurologist at this time.  Review of Systems      Past Medical History  Diagnosis Date  . Hx of ovarian cyst     x 2-3  . Gestational hypertension 01/2012    also had postpartum preelampsia  . Plica of knee 08/2012    medial plica left knee  . Migraine     Current Outpatient Prescriptions  Medication Sig Dispense Refill  . aspirin-acetaminophen-caffeine (EXCEDRIN MIGRAINE) 250-250-65 MG per tablet Take 2 tablets by mouth every 8 (eight) hours as needed for headache or migraine.      .  Aspirin-Salicylamide-Caffeine (BC HEADACHE POWDER PO) Take 1 packet by mouth daily as needed (for headache).       No current facility-administered medications for this visit.    Allergies  Allergen Reactions  . Cefuroxime Axetil Rash    Family History  Problem Relation Age of Onset  . Hypertension Mother   . Heart disease Mother     MI  . Anesthesia problems Mother     post-op N/V  . Asthma Sister   . Asthma Brother   . Diabetes Maternal Aunt     History   Social History  . Marital Status: Single    Spouse Name: N/A    Number of Children: N/A  . Years of Education: N/A   Occupational History  . Not on file.   Social History Main Topics  . Smoking status: Never Smoker   . Smokeless tobacco: Never Used  . Alcohol Use: No  . Drug Use: No  . Sexual Activity: Yes    Birth Control/ Protection: None     Comment: last night   Other Topics Concern  . Not on file   Social History Narrative  . No narrative on file     Constitutional: Denies fever, malaise, fatigue, or abrupt weight changes.  Respiratory: Denies difficulty breathing, shortness of breath, cough or sputum production.   Cardiovascular: Denies chest pain, chest tightness, palpitations or swelling in the hands  or feet.  Neurological: Denies dizziness, difficulty with memory, difficulty with speech or problems with balance and coordination.  Psych: Pt reports anxiety. Denies depression, SI/HI.  No other specific complaints in a complete review of systems (except as listed in HPI above).  Objective:   Physical Exam  BP 108/68  Pulse 72  Temp(Src) 97 F (36.1 C) (Oral)  Ht 5\' 4"  (1.626 m)  Wt 148 lb 4 oz (67.246 kg)  BMI 25.43 kg/m2  SpO2 97%  LMP 10/14/2013 Wt Readings from Last 3 Encounters:  11/01/13 148 lb 4 oz (67.246 kg) (81%*, Z = 0.86)  10/28/13 148 lb 8 oz (67.359 kg) (81%*, Z = 0.87)  09/26/13 143 lb 6.4 oz (65.046 kg) (76%*, Z = 0.71)   * Growth percentiles are based on CDC 2-20  Years data.    General: Appears her stated age, well developed, well nourished in NAD. Cardiovascular: Normal rate and rhythm. S1,S2 noted.  No murmur, rubs or gallops noted. No JVD or BLE edema. No carotid bruits noted. Pulmonary/Chest: Normal effort and positive vesicular breath sounds. No respiratory distress. No wheezes, rales or ronchi noted.  Neurological: Alert and oriented. Cranial nerves II-XII intact. Coordination normal. +DTRs bilaterally. Psychiatric: Mood anxious/tearful and affect normal. Behavior is normal. Judgment and thought content normal.     BMET    Component Value Date/Time   NA 138 10/28/2013 1955   K 3.5 10/28/2013 1955   CL 101 10/28/2013 1955   CO2 23 10/28/2013 1955   GLUCOSE 101* 10/28/2013 1955   BUN 14 10/28/2013 1955   CREATININE 0.63 10/28/2013 1955   CALCIUM 9.8 10/28/2013 1955   GFRNONAA >90 10/28/2013 1955   GFRAA >90 10/28/2013 1955    Lipid Panel     Component Value Date/Time   CHOL 174 04/04/2012 1410   TRIG 62.0 04/04/2012 1410   HDL 58.30 04/04/2012 1410   CHOLHDL 3 04/04/2012 1410   VLDL 12.4 04/04/2012 1410   LDLCALC 103* 04/04/2012 1410    CBC    Component Value Date/Time   WBC 10.2 10/28/2013 1955   RBC 4.27 10/28/2013 1955   HGB 13.8 10/28/2013 1955   HCT 38.6 10/28/2013 1955   PLT 234 10/28/2013 1955   MCV 90.4 10/28/2013 1955   MCH 32.3 10/28/2013 1955   MCHC 35.8 10/28/2013 1955   RDW 12.2 10/28/2013 1955   LYMPHSABS 2.0 04/04/2012 1410   MONOABS 0.4 04/04/2012 1410   EOSABS 0.1 04/04/2012 1410   BASOSABS 0.0 04/04/2012 1410    Hgb A1C No results found for this basename: HGBA1C         Assessment & Plan:

## 2013-11-01 NOTE — Progress Notes (Signed)
Pre-visit discussion using our clinic review tool. No additional management support is needed unless otherwise documented below in the visit note.  

## 2013-11-01 NOTE — Patient Instructions (Signed)
Recurrent Migraine Headache  A migraine headache is an intense, throbbing pain on one or both sides of your head. Recurrent migraines keep coming back. A migraine can last for 30 minutes to several hours.  CAUSES   The exact cause of a migraine headache is not always known. However, a migraine may be caused when nerves in the brain become irritated and release chemicals that cause inflammation. This causes pain.   SYMPTOMS    Pain on one or both sides of your head.   Pulsating or throbbing pain.   Severe pain that prevents daily activities.   Pain that is aggravated by any physical activity.   Nausea, vomiting, or both.   Dizziness.   Pain with exposure to bright lights, loud noises, or activity.   General sensitivity to bright lights, loud noises, or smells.  Before you get a migraine, you may get warning signs that a migraine is coming (aura). An aura may include:   Seeing flashing lights.   Seeing bright spots, halos, or zig-zag lines.   Having tunnel vision or blurred vision.   Having feelings of numbness or tingling.   Having trouble talking.   Having muscle weakness.  MIGRAINE TRIGGERS  Examples of triggers of migraine headaches include:    Alcohol.   Smoking.   Stress.   Menstruation.   Aged cheeses.   Foods or drinks that contain nitrates, glutamate, aspartame, or tyramine.   Lack of sleep.   Chocolate.   Caffeine.   Hunger.   Physical exertion.   Fatigue.   Medicines used to treat chest pain (nitroglycerine), birth control pills, estrogen, and some blood pressure medicines.  DIAGNOSIS   A recurrent migraine headache is often diagnosed based on:   Symptoms.   Physical examination.   A CT scan or MRI of your head.  TREATMENT   Medicines may be given for pain and nausea. Medicines can also be given to help prevent recurrent migraines.  HOME CARE INSTRUCTIONS   Only take over-the-counter or prescription medicines for pain or discomfort as directed by your caregiver. The use of  long-term narcotics is not recommended.   Lie down in a dark, quiet room when you have a migraine.   Keep a journal to find out what may trigger your migraine headaches. For example, write down:   What you eat and drink.   How much sleep you get.   Any change to your diet or medicines.   Limit alcohol consumption.   Quit smoking if you smoke.   Get 7 to 9 hours of sleep, or as recommended by your caregiver.   Limit stress.   Keep lights dim if bright lights bother you and make your migraines worse.  SEEK MEDICAL CARE IF:    You do not get relief from the medicines given to you.   You have a recurrence of pain.  SEEK IMMEDIATE MEDICAL CARE IF:   Your migraine becomes severe.   You have a fever.   You have a stiff neck.   You have loss of vision.   You have muscular weakness or loss of muscle control.   You start losing your balance or have trouble walking.   You feel faint or pass out.   You have severe symptoms that are different from your first symptoms.  MAKE SURE YOU:    Understand these instructions.   Will watch your condition.   Will get help right away if you are not doing well or get worse.    Document Released: 08/16/2001 Document Revised: 02/13/2012 Document Reviewed: 11/11/2011  [employer] Patient Information 2014 [employer]

## 2013-11-01 NOTE — Assessment & Plan Note (Signed)
Seems to be worse ? Related to stress/anxiety Lets treat the anxiety first to see if this helps We will refer you to the headache clinic Ok to take excedrin as needed

## 2013-12-03 ENCOUNTER — Emergency Department (HOSPITAL_COMMUNITY)
Admission: EM | Admit: 2013-12-03 | Discharge: 2013-12-03 | Disposition: A | Discharge status: Home / Self Care | Payer: 59 | Source: Home / Self Care | Attending: Emergency Medicine | Admitting: Emergency Medicine

## 2013-12-03 ENCOUNTER — Emergency Department (INDEPENDENT_AMBULATORY_CARE_PROVIDER_SITE_OTHER): Payer: 59

## 2013-12-03 ENCOUNTER — Encounter (HOSPITAL_COMMUNITY): Payer: Self-pay | Admitting: Emergency Medicine

## 2013-12-03 DIAGNOSIS — J069 Acute upper respiratory infection, unspecified: Secondary | ICD-10-CM

## 2013-12-03 LAB — CBC WITH DIFFERENTIAL/PLATELET
Basophils Absolute: 0.1 10*3/uL (ref 0.0–0.1)
Basophils Relative: 1 % (ref 0–1)
HCT: 40.9 % (ref 36.0–46.0)
Lymphocytes Relative: 39 % (ref 12–46)
Lymphs Abs: 2.1 10*3/uL (ref 0.7–4.0)
Monocytes Absolute: 0.3 10*3/uL (ref 0.1–1.0)
Neutro Abs: 2.8 10*3/uL (ref 1.7–7.7)
Platelets: 227 10*3/uL (ref 150–400)
RDW: 12.6 % (ref 11.5–15.5)
WBC: 5.5 10*3/uL (ref 4.0–10.5)

## 2013-12-03 LAB — POCT URINALYSIS DIP (DEVICE)
Bilirubin Urine: NEGATIVE
Ketones, ur: NEGATIVE mg/dL
Protein, ur: 30 mg/dL — AB
Urobilinogen, UA: 0.2 mg/dL (ref 0.0–1.0)

## 2013-12-03 LAB — POCT I-STAT, CHEM 8
BUN: 13 mg/dL (ref 6–23)
Calcium, Ion: 1.24 mmol/L — ABNORMAL HIGH (ref 1.12–1.23)
Creatinine, Ser: 0.7 mg/dL (ref 0.50–1.10)
Glucose, Bld: 94 mg/dL (ref 70–99)
Hemoglobin: 14.6 g/dL (ref 12.0–15.0)
Sodium: 142 mEq/L (ref 137–147)
TCO2: 26 mmol/L (ref 0–100)

## 2013-12-03 LAB — URINALYSIS, ROUTINE W REFLEX MICROSCOPIC
Bilirubin Urine: NEGATIVE
Glucose, UA: NEGATIVE mg/dL
Hgb urine dipstick: NEGATIVE
Ketones, ur: NEGATIVE mg/dL
Protein, ur: NEGATIVE mg/dL
Urobilinogen, UA: 1 mg/dL (ref 0.0–1.0)

## 2013-12-03 MED ORDER — ONDANSETRON 8 MG PO TBDP: 8.0000 mg | ORAL_TABLET | Freq: Three times a day (TID) | ORAL | Status: DC | PRN

## 2013-12-03 MED ORDER — AEROCHAMBER PLUS FLO-VU LARGE MISC: 1.0000 | Freq: Once | Status: DC

## 2013-12-03 MED ORDER — IPRATROPIUM BROMIDE 0.02 % IN SOLN
0.5000 mg | Freq: Once | RESPIRATORY_TRACT | Status: AC
  Administered 2013-12-03: 0.5 mg via RESPIRATORY_TRACT

## 2013-12-03 MED ORDER — IPRATROPIUM BROMIDE 0.02 % IN SOLN
RESPIRATORY_TRACT | Status: AC
  Filled 2013-12-03: qty 2.5

## 2013-12-03 MED ORDER — ALBUTEROL SULFATE (5 MG/ML) 0.5% IN NEBU
5.0000 mg | INHALATION_SOLUTION | Freq: Once | RESPIRATORY_TRACT | Status: AC
  Administered 2013-12-03: 5 mg via RESPIRATORY_TRACT

## 2013-12-03 MED ORDER — ALBUTEROL SULFATE HFA 108 (90 BASE) MCG/ACT IN AERS: 1.0000 | INHALATION_SPRAY | Freq: Four times a day (QID) | RESPIRATORY_TRACT | Status: DC | PRN

## 2013-12-03 MED ORDER — AZITHROMYCIN 250 MG PO TABS: ORAL_TABLET | ORAL | Status: DC

## 2013-12-03 MED ORDER — METHYLPREDNISOLONE 4 MG PO KIT: PACK | ORAL | Status: DC

## 2013-12-03 MED ORDER — GUAIFENESIN-CODEINE 100-10 MG/5ML PO SOLN: 10.0000 mL | ORAL | Status: DC | PRN

## 2013-12-03 NOTE — ED Notes (Signed)
C/o  Cough and fever that started on 12/25.  Now having body aches, fatigue, and nausea.  Symptoms gradually getting worse.  No relief with mucinex.

## 2013-12-03 NOTE — ED Provider Notes (Signed)
CSN: [id number]     Arrival date & time 12/03/13  4098 History   First MD Initiated Contact with Patient 12/03/13 1038     Chief Complaint  Patient presents with  . Influenza   (Consider location/radiation/quality/duration/timing/severity/associated sxs/prior Treatment) HPI Comments: 17 year old female presents complaining of severe body aches, back ache, and extreme fatigue. This started with cough and subjective fever on 12/25, 5 days ago. She started to get better until 2 days ago, when she started feeling much worse. She has been getting consistently worse. She still having the cough, but she is most bothered by the body aches, back ache, and fatigue. She also feels very congested and has a heaviness in her chest with some shortness of breath as well. She has close contact with multiple people were eventually diagnosed with influenza around the time when her illness began.   Patient is a 17 y.o. female presenting with flu symptoms.  Influenza Presenting symptoms: cough, fatigue, fever, rhinorrhea, shortness of breath and sore throat   Presenting symptoms: no myalgias, no nausea and no vomiting   Associated symptoms: chills and nasal congestion   Associated symptoms: no ear pain     Past Medical History  Diagnosis Date  . Hx of ovarian cyst     x 2-3  . Gestational hypertension 01/2012    also had postpartum preelampsia  . Plica of knee 08/2012    medial plica left knee  . Migraine    Past Surgical History  Procedure Laterality Date  . Tympanostomy tube placement    . Knee arthroscopy  08/17/2012    Procedure: ARTHROSCOPY KNEE;  Surgeon: Harvie Junior, MD;  Location:  [facility];  Service: Orthopedics;  Laterality: Left;  left knee scope with tight lateral retinacular release and plica removal   Family History  Problem Relation Age of Onset  . Hypertension Mother   . Heart disease Mother     MI  . Anesthesia problems Mother     post-op N/V  . Asthma Sister    . Asthma Brother   . Diabetes Maternal Aunt    History  Substance Use Topics  . Smoking status: Never Smoker   . Smokeless tobacco: Never Used  . Alcohol Use: No   OB History   Grav Para Term Preterm Abortions TAB SAB Ect Mult Living   2 1 1  1 1    1      Review of Systems  Constitutional: Positive for fever, chills, diaphoresis and fatigue.  HENT: Positive for congestion, rhinorrhea and sore throat. Negative for ear pain, postnasal drip and sinus pressure.   Eyes: Negative for visual disturbance.  Respiratory: Positive for cough, chest tightness and shortness of breath.   Cardiovascular: Positive for chest pain (mid upper chest, with coughing only). Negative for palpitations and leg swelling.  Gastrointestinal: Negative for nausea, vomiting and abdominal pain.  Endocrine: Negative for polydipsia and polyuria.  Genitourinary: Negative for dysuria, urgency and frequency.  Musculoskeletal: Negative for arthralgias and myalgias.  Skin: Negative for rash.  Neurological: Positive for weakness. Negative for dizziness and light-headedness.    Allergies  Cefuroxime axetil  Home Medications   Current Outpatient Rx  Name  Route  Sig  Dispense  Refill  . FLUoxetine (PROZAC) 10 MG tablet   Oral   Take 1 tablet (10 mg total) by mouth daily.   30 tablet   3   . albuterol (PROVENTIL HFA;VENTOLIN HFA) 108 (90 BASE) MCG/ACT inhaler   Inhalation  Inhale 1-2 puffs into the lungs every 6 (six) hours as needed for wheezing or shortness of breath.   1 Inhaler   0   . aspirin-acetaminophen-caffeine (EXCEDRIN MIGRAINE) 250-250-65 MG per tablet   Oral   Take 2 tablets by mouth every 8 (eight) hours as needed for headache or migraine.         . Aspirin-Salicylamide-Caffeine (BC HEADACHE POWDER PO)   Oral   Take 1 packet by mouth daily as needed (for headache).         Marland Kitchen azithromycin (ZITHROMAX Z-PAK) 250 MG tablet      Use as directed   6 each   0   . guaiFENesin-codeine  100-10 MG/5ML syrup   Oral   Take 10 mLs by mouth every 4 (four) hours as needed for cough.   120 mL   0   . methylPREDNISolone (MEDROL DOSEPAK) 4 MG tablet      follow package directions   21 tablet   0     Dispense as written.   . ondansetron (ZOFRAN ODT) 8 MG disintegrating tablet   Oral   Take 1 tablet (8 mg total) by mouth every 8 (eight) hours as needed for nausea or vomiting.   12 tablet   0   . Spacer/Aero-Holding Chambers (AEROCHAMBER PLUS FLO-VU LARGE) MISC   Other   1 each by Other route once.   1 each   0    BP 111/70  Pulse 71  Temp(Src) 98.4 F (36.9 C)  Resp 20  SpO2 100%  LMP 11/15/2013  Breastfeeding? No Physical Exam  Nursing note and vitals reviewed. Constitutional: She is oriented to person, place, and time. Vital signs are normal. She appears well-developed and well-nourished. No distress.  HENT:  Head: Normocephalic and atraumatic.  Right Ear: External ear normal.  Left Ear: External ear normal.  Nose: Nose normal.  Mouth/Throat: Oropharynx is clear and moist. No oropharyngeal exudate.  Eyes: Conjunctivae are normal. Right eye exhibits no discharge. Left eye exhibits no discharge.  Neck: Normal range of motion. Neck supple.  Cardiovascular: Normal rate, regular rhythm and normal heart sounds.  Exam reveals no gallop and no friction rub.   No murmur heard. Pulmonary/Chest: Effort normal. No respiratory distress. She has no decreased breath sounds. She has no wheezes. She has no rhonchi. She has rales in the right middle field.  Lymphadenopathy:    She has no cervical adenopathy.  Neurological: She is alert and oriented to person, place, and time. She has normal strength. Coordination normal.  Skin: Skin is warm and dry. No rash noted. She is not diaphoretic.  Psychiatric: She has a normal mood and affect. Judgment normal.    ED Course  Procedures (including critical care time) Labs Review Labs Reviewed  URINALYSIS, ROUTINE W REFLEX  MICROSCOPIC - Abnormal; Notable for the following:    APPearance CLOUDY (*)    All other components within normal limits  POCT URINALYSIS DIP (DEVICE) - Abnormal; Notable for the following:    Protein, ur 30 (*)    Leukocytes, UA TRACE (*)    All other components within normal limits  POCT I-STAT, CHEM 8 - Abnormal; Notable for the following:    Calcium, Ion 1.24 (*)    All other components within normal limits  URINE CULTURE  CBC WITH DIFFERENTIAL   Imaging Review Dg Chest 2 View  12/03/2013   CLINICAL DATA:  17 year old female with cough, congestion and chest pain.  EXAM: CHEST  2  VIEW  COMPARISON:  10/28/2013 and 03/24/2011.  FINDINGS: The cardiomediastinal silhouette is unremarkable.  There is no evidence of focal airspace disease, pulmonary edema, suspicious pulmonary nodule/mass, pleural effusion, or pneumothorax. No acute bony abnormalities are identified.  IMPRESSION: No active cardiopulmonary disease.   Electronically Signed   By: Laveda Abbe M.D.   On: 12/03/2013 11:42      MDM   1. URI (upper respiratory infection)    Chest x-ray and labs normal. We'll treat with azithromycin due to the worsening, possible early bacterial infection. Also treating symptomatically. Followup when necessary   Meds ordered this encounter  Medications  . ipratropium (ATROVENT) nebulizer solution 0.5 mg    Sig:    And  . albuterol (PROVENTIL) (5 MG/ML) 0.5% nebulizer solution 5 mg    Sig:   . azithromycin (ZITHROMAX Z-PAK) 250 MG tablet    Sig: Use as directed    Dispense:  6 each    Refill:  0    Order Specific Question:  Supervising Provider    Answer:  Lorenz Coaster, DAVID C [id number]  . albuterol (PROVENTIL HFA;VENTOLIN HFA) 108 (90 BASE) MCG/ACT inhaler    Sig: Inhale 1-2 puffs into the lungs every 6 (six) hours as needed for wheezing or shortness of breath.    Dispense:  1 Inhaler    Refill:  0    Order Specific Question:  Supervising Provider    Answer:  Lorenz Coaster, DAVID C [id number]  .  methylPREDNISolone (MEDROL DOSEPAK) 4 MG tablet    Sig: follow package directions    Dispense:  21 tablet    Refill:  0    Order Specific Question:  Supervising Provider    Answer:  Lorenz Coaster, DAVID C [id number]  . Spacer/Aero-Holding Chambers (AEROCHAMBER PLUS FLO-VU LARGE) MISC    Sig: 1 each by Other route once.    Dispense:  1 each    Refill:  0    Order Specific Question:  Supervising Provider    Answer:  Lorenz Coaster, DAVID C [id number]  . guaiFENesin-codeine 100-10 MG/5ML syrup    Sig: Take 10 mLs by mouth every 4 (four) hours as needed for cough.    Dispense:  120 mL    Refill:  0    Order Specific Question:  Supervising Provider    Answer:  Lorenz Coaster, DAVID C [id number]  . ondansetron (ZOFRAN ODT) 8 MG disintegrating tablet    Sig: Take 1 tablet (8 mg total) by mouth every 8 (eight) hours as needed for nausea or vomiting.    Dispense:  12 tablet    Refill:  0    Order Specific Question:  Supervising Provider    Answer:  Lorenz Coaster, DAVID C [[id number]]       Graylon Good, PA-C 12/03/13 1247

## 2013-12-03 NOTE — ED Provider Notes (Signed)
Medical screening examination/treatment/procedure(s) were performed by non-physician practitioner and as supervising physician I was immediately available for consultation/collaboration.  Leslee Home, M.D.  Reuben Likes, MD 12/03/13 [phone]

## 2013-12-04 LAB — URINE CULTURE
Colony Count: NO GROWTH
Culture: NO GROWTH

## 2013-12-05 NOTE — L&D Delivery Note (Signed)
SVD of VMI at 0515 on 09/18/14.  EBL 250cc.  Placenta to L&D.  APGARs 8,9. Head delivered LOA with loose nuchal x 1 reduced.  Body delivered atraumatically.  Mouth and nose bulb suctioned.  Cord was clamped, cut and baby to abdomen.  Cord blood was obtained.  Placenta delivered S/I/3VC.  Fundus was firmed with pitocin and massage.  Perineum intact.  Mom and baby stable.   Mitchel HonourMegan Ladamien Rammel, DO

## 2014-01-08 ENCOUNTER — Encounter: Payer: Self-pay | Admitting: Internal Medicine

## 2014-02-20 LAB — OB RESULTS CONSOLE RPR: RPR: NONREACTIVE

## 2014-02-20 LAB — OB RESULTS CONSOLE GC/CHLAMYDIA
CHLAMYDIA, DNA PROBE: NEGATIVE
GC PROBE AMP, GENITAL: NEGATIVE

## 2014-02-20 LAB — OB RESULTS CONSOLE HEPATITIS B SURFACE ANTIGEN: Hepatitis B Surface Ag: NEGATIVE

## 2014-02-20 LAB — OB RESULTS CONSOLE ABO/RH: RH Type: POSITIVE

## 2014-02-20 LAB — OB RESULTS CONSOLE RUBELLA ANTIBODY, IGM: Rubella: IMMUNE

## 2014-02-20 LAB — OB RESULTS CONSOLE ANTIBODY SCREEN: Antibody Screen: NEGATIVE

## 2014-02-20 LAB — OB RESULTS CONSOLE HIV ANTIBODY (ROUTINE TESTING): HIV: NONREACTIVE

## 2014-04-17 ENCOUNTER — Encounter (HOSPITAL_COMMUNITY): Payer: Self-pay | Admitting: *Deleted

## 2014-04-17 ENCOUNTER — Inpatient Hospital Stay (HOSPITAL_COMMUNITY)
Admission: AD | Admit: 2014-04-17 | Discharge: 2014-04-18 | Disposition: A | Discharge status: Home / Self Care | Payer: 59 | Source: Ambulatory Visit | Attending: Obstetrics and Gynecology | Admitting: Obstetrics and Gynecology

## 2014-04-17 DIAGNOSIS — G43109 Migraine with aura, not intractable, without status migrainosus: Secondary | ICD-10-CM | POA: Insufficient documentation

## 2014-04-17 DIAGNOSIS — R51 Headache: Secondary | ICD-10-CM

## 2014-04-17 DIAGNOSIS — O99891 Other specified diseases and conditions complicating pregnancy: Secondary | ICD-10-CM | POA: Insufficient documentation

## 2014-04-17 DIAGNOSIS — O26892 Other specified pregnancy related conditions, second trimester: Secondary | ICD-10-CM

## 2014-04-17 DIAGNOSIS — R519 Headache, unspecified: Secondary | ICD-10-CM

## 2014-04-17 DIAGNOSIS — O210 Mild hyperemesis gravidarum: Secondary | ICD-10-CM | POA: Insufficient documentation

## 2014-04-17 DIAGNOSIS — O9989 Other specified diseases and conditions complicating pregnancy, childbirth and the puerperium: Principal | ICD-10-CM

## 2014-04-17 LAB — URINALYSIS, ROUTINE W REFLEX MICROSCOPIC
BILIRUBIN URINE: NEGATIVE
Glucose, UA: NEGATIVE mg/dL
Hgb urine dipstick: NEGATIVE
KETONES UR: 15 mg/dL — AB
Leukocytes, UA: NEGATIVE
NITRITE: NEGATIVE
Protein, ur: NEGATIVE mg/dL
UROBILINOGEN UA: 0.2 mg/dL (ref 0.0–1.0)
pH: 6 (ref 5.0–8.0)

## 2014-04-17 MED ORDER — ONDANSETRON 8 MG PO TBDP
8.0000 mg | ORAL_TABLET | Freq: Once | ORAL | Status: AC
  Administered 2014-04-17: 8 mg via ORAL
  Filled 2014-04-17: qty 1

## 2014-04-17 MED ORDER — IBUPROFEN 600 MG PO TABS: 600.0000 mg | ORAL_TABLET | Freq: Once | ORAL | Status: DC

## 2014-04-17 MED ORDER — BUTALBITAL-APAP-CAFFEINE 50-325-40 MG PO TABS
2.0000 | ORAL_TABLET | Freq: Once | ORAL | Status: AC
  Administered 2014-04-17: 2 via ORAL
  Filled 2014-04-17: qty 2

## 2014-04-17 NOTE — MAU Note (Addendum)
PT SAYS SHE HAS MORNING  SICKNESS-  HAS BEEN   NAUSEA X2 WEEKS   AND  VOMITING X2 DAYS  FROM H/A-  HAS HX  - MIGRAINES -  TOOK DICLEGIS- TYLENOL.    CALLED OFFICE AT   [facility] TODAY-  NO ONE CALLED BACK.     FEELS SOME LOWER  ABD  CRAMPS.     ATE LAST AT 1130-    VOMITED -  6- GINGER ALE - VOMITED.

## 2014-04-17 NOTE — MAU Provider Note (Signed)
Chief Complaint: No chief complaint on file.   First Provider Initiated Contact with Patient 04/17/2014 at 2340.  SUBJECTIVE HPI: [name] Sanchez is a 18 y.o. G2P1011 at 17 weeks who presents with migraine and N/V. No improvement headache with Tylenol. Does not have any antiemetics. States this headache is typical of her migraine headaches that she had before pregnancy. States she called the office today to ask for medication, but did not get a call back.  Past Medical History  Diagnosis Date  . Hx of ovarian cyst     x 2-3  . Gestational hypertension 01/2012    also had postpartum preelampsia  . Plica of knee 08/2012    medial plica left knee  . Migraine    OB History  Gravida Para Term Preterm AB SAB TAB Ectopic Multiple Living  3 1 1  1  1   1     # Outcome Date GA Lbr Len/2nd Weight Sex Delivery Anes PTL Lv  3 CUR           2 TRM 01/16/12 1411w3d 10:43 / 01:30 3.515 kg (7 lb 12 oz) M SVD EPI  Y  1 TAB              Past Surgical History  Procedure Laterality Date  . Tympanostomy tube placement    . Knee arthroscopy  08/17/2012    Procedure: ARTHROSCOPY KNEE;  Surgeon: Harvie JuniorJohn L Graves, MD;  Location: [facility];  Service: Orthopedics;  Laterality: Left;  left knee scope with tight lateral retinacular release and plica removal   History   Social History  . Marital Status: Married    Spouse Name: N/A    Number of Children: N/A  . Years of Education: N/A   Occupational History  . Not on file.   Social History Main Topics  . Smoking status: Never Smoker   . Smokeless tobacco: Never Used  . Alcohol Use: No  . Drug Use: No  . Sexual Activity: Yes    Birth Control/ Protection: None     Comment: last night   Other Topics Concern  . Not on file   Social History Narrative  . No narrative on file   No current facility-administered medications on file prior to encounter.   No current outpatient prescriptions on file prior to encounter.   Allergies  Allergen  Reactions  . Cefuroxime Axetil Rash    ROS: Pertinent positive items in HPI. Also positive for photophobia and difficulty focusing (typical for her migraines). Negative for fever, chills, weakness, difficulties with speech or gait, neck stiffness, seasonal allergies, upper respiratory infection, altered mental status, diarrhea, constipation, urinary complaints, flank pain, abdominal pain or vaginal bleeding.  OBJECTIVE Blood pressure 115/59, pulse 85, temperature 98.3 F (36.8 C), temperature source Oral, resp. rate 18, height 5\' 4"  (1.626 m), weight 68.947 kg (152 lb), last menstrual period 11/15/2013. GENERAL: Well-developed, well-nourished female in mild distress. Improves with light turned off. HEENT: Normocephalic. mucous membranes moist. HEART: normal rate RESP: normal effort ABDOMEN: Soft, non-tender. Gravid, size equals date. Negative CVA tenderness. EXTREMITIES: Nontender, no edema NEURO: Alert and oriented SPECULUM EXAM: Deferred FHR 140 by doppler  LAB RESULTS Results for orders placed during the hospital encounter of 04/17/14 (from the past 24 hour(s))  URINALYSIS, ROUTINE W REFLEX MICROSCOPIC     Status: Abnormal   Collection Time    04/17/14  9:57 PM      Result Value Ref Range   Color, Urine YELLOW  YELLOW   APPearance CLEAR  CLEAR   Specific Gravity, Urine <1.005 (*) 1.005 - 1.030   pH 6.0  5.0 - 8.0   Glucose, UA NEGATIVE  NEGATIVE mg/dL   Hgb urine dipstick NEGATIVE  NEGATIVE   Bilirubin Urine NEGATIVE  NEGATIVE   Ketones, ur 15 (*) NEGATIVE mg/dL   Protein, ur NEGATIVE  NEGATIVE mg/dL   Urobilinogen, UA 0.2  0.0 - 1.0 mg/dL   Nitrite NEGATIVE  NEGATIVE   Leukocytes, UA NEGATIVE  NEGATIVE    IMAGING No results found.  MAU COURSE Fioricet and Zofran given.  Symptoms resolved. Tolerating crackers.  ASSESSMENT 1. Pregnancy headache in second trimester   2. Migraine with aura     PLAN Discharge home in stable condition. Advance diet slowly. May  need referral to headache wellness Center or [facility]. [facility] or call office as needed.     Follow-up Information   [facility] (As scheduled or, As needed, If symptoms worsen)    Contact information:   [address] [place] [id number] [phone]      Follow [facility] with [facility]. (As needed in emergencies)    Contact information:   [address] [id number] [place] [id number] [phone]       Medication List         acetaminophen 500 MG tablet  Commonly known as:  TYLENOL  Take 1,000 mg by mouth every 6 (six) hours as needed for headache.     butalbital-acetaminophen-caffeine 50-325-40 MG per tablet  Commonly known as:  FIORICET  Take 1-2 tablets by mouth every 6 (six) hours as needed for headache.     DICLEGIS 10-10 MG Tbec  Generic drug:  Doxylamine-Pyridoxine  Take 2 tablets by mouth 2 (two) times daily as needed (For nausea.).     ondansetron 8 MG disintegrating tablet  Commonly known as:  ZOFRAN ODT  Take 1 tablet (8 mg total) by mouth every 8 (eight) hours as needed for nausea or vomiting.     prenatal multivitamin Tabs tablet  Take 1 tablet by mouth daily at 12 noon.         [place] Marlise Fahr, CNM 04/18/2014  12:04 AM

## 2014-04-18 DIAGNOSIS — R51 Headache: Secondary | ICD-10-CM

## 2014-04-18 DIAGNOSIS — O9989 Other specified diseases and conditions complicating pregnancy, childbirth and the puerperium: Secondary | ICD-10-CM

## 2014-04-18 MED ORDER — BUTALBITAL-APAP-CAFFEINE 50-325-40 MG PO TABS: 1.0000 | ORAL_TABLET | Freq: Four times a day (QID) | ORAL | Status: DC | PRN

## 2014-04-18 MED ORDER — ONDANSETRON 8 MG PO TBDP
8.0000 mg | ORAL_TABLET | Freq: Three times a day (TID) | ORAL | Status: DC | PRN
Start: 2014-04-18 — End: 2014-07-25

## 2014-04-18 NOTE — Discharge Instructions (Signed)
Migraine Headache A migraine headache is an intense, throbbing pain on one or both sides of your head. A migraine can last for 30 minutes to several hours. CAUSES  The exact cause of a migraine headache is not always known. However, a migraine may be caused when nerves in the brain become irritated and release chemicals that cause inflammation. This causes pain. Certain things may also trigger migraines, such as:  Alcohol.  Smoking.  Stress.  Menstruation.  Aged cheeses.  Foods or drinks that contain nitrates, glutamate, aspartame, or tyramine.  Lack of sleep.  Chocolate.  Caffeine.  Hunger.  Physical exertion.  Fatigue.  Medicines used to treat chest pain (nitroglycerine), birth control pills, estrogen, and some blood pressure medicines. SIGNS AND SYMPTOMS  Pain on one or both sides of your head.  Pulsating or throbbing pain.  Severe pain that prevents daily activities.  Pain that is aggravated by any physical activity.  Nausea, vomiting, or both.  Dizziness.  Pain with exposure to bright lights, loud noises, or activity.  General sensitivity to bright lights, loud noises, or smells. Before you get a migraine, you may get warning signs that a migraine is coming (aura). An aura may include:  Seeing flashing lights.  Seeing bright spots, halos, or zig-zag lines.  Having tunnel vision or blurred vision.  Having feelings of numbness or tingling.  Having trouble talking.  Having muscle weakness. DIAGNOSIS  A migraine headache is often diagnosed based on:  Symptoms.  Physical exam.  A CT scan or MRI of your head. These imaging tests cannot diagnose migraines, but they can help rule out other causes of headaches. TREATMENT Medicines may be given for pain and nausea. Medicines can also be given to help prevent recurrent migraines.  HOME CARE INSTRUCTIONS  Only take over-the-counter or prescription medicines for pain or discomfort as directed by your  health care provider. The use of long-term narcotics is not recommended.  Lie down in a dark, quiet room when you have a migraine.  Keep a journal to find out what may trigger your migraine headaches. For example, write down:  What you eat and drink.  How much sleep you get.  Any change to your diet or medicines.  Limit alcohol consumption.  Quit smoking if you smoke.  Get 7 9 hours of sleep, or as recommended by your health care provider.  Limit stress.  Keep lights dim if bright lights bother you and make your migraines worse. SEEK IMMEDIATE MEDICAL CARE IF:   Your migraine becomes severe.  You have a fever.  You have a stiff neck.  You have vision loss.  You have muscular weakness or loss of muscle control.  You start losing your balance or have trouble walking.  You feel faint or pass out.  You have severe symptoms that are different from your first symptoms. MAKE SURE YOU:   Understand these instructions.  Will watch your condition.  Will get help right away if you are not doing well or get worse. Document Released: 11/21/2005 Document Revised: 09/11/2013 Document Reviewed: 07/29/2013 [employer] Patient Information 2014 [employer].  

## 2014-07-25 ENCOUNTER — Inpatient Hospital Stay (HOSPITAL_COMMUNITY)
Admission: AD | Admit: 2014-07-25 | Discharge: 2014-07-25 | Disposition: A | Discharge status: Home / Self Care | Payer: 59 | Source: Ambulatory Visit | Attending: Obstetrics and Gynecology | Admitting: Obstetrics and Gynecology

## 2014-07-25 ENCOUNTER — Encounter (HOSPITAL_COMMUNITY): Payer: Self-pay | Admitting: *Deleted

## 2014-07-25 DIAGNOSIS — M549 Dorsalgia, unspecified: Secondary | ICD-10-CM | POA: Diagnosis not present

## 2014-07-25 DIAGNOSIS — O368390 Maternal care for abnormalities of the fetal heart rate or rhythm, unspecified trimester, not applicable or unspecified: Secondary | ICD-10-CM | POA: Diagnosis not present

## 2014-07-25 DIAGNOSIS — R109 Unspecified abdominal pain: Secondary | ICD-10-CM | POA: Insufficient documentation

## 2014-07-25 HISTORY — DX: Panic disorder (episodic paroxysmal anxiety): F41.0

## 2014-07-25 LAB — CBC
HCT: 34.4 % — ABNORMAL LOW (ref 36.0–46.0)
Hemoglobin: 11.5 g/dL — ABNORMAL LOW (ref 12.0–15.0)
MCH: 32 pg (ref 26.0–34.0)
MCHC: 33.4 g/dL (ref 30.0–36.0)
MCV: 95.8 fL (ref 78.0–100.0)
PLATELETS: 155 10*3/uL (ref 150–400)
RBC: 3.59 MIL/uL — AB (ref 3.87–5.11)
RDW: 13.3 % (ref 11.5–15.5)
WBC: 11.4 10*3/uL — ABNORMAL HIGH (ref 4.0–10.5)

## 2014-07-25 MED ORDER — ACETAMINOPHEN 325 MG PO TABS
650.0000 mg | ORAL_TABLET | Freq: Once | ORAL | Status: AC
  Administered 2014-07-25: 650 mg via ORAL
  Filled 2014-07-25: qty 2

## 2014-07-25 NOTE — MAU Note (Signed)
Sent from MD office. Had decel in FHR. Patient was being seen today for abdominal cramping and back pain. Orders by MD initiated.

## 2014-07-25 NOTE — MAU Note (Signed)
Urine in lab 

## 2014-07-25 NOTE — Discharge Instructions (Signed)
Call the office and make an appointment to be seen Monday for monitoring. Drink 8-10 glasses of water per day. Make sure the baby is moving well everyday. Call the office or provider on call with further concerns or return to MAU as needed.

## 2014-09-11 ENCOUNTER — Inpatient Hospital Stay (HOSPITAL_COMMUNITY)
Admission: AD | Admit: 2014-09-11 | Discharge: 2014-09-11 | Disposition: A | Discharge status: Home / Self Care | Payer: 59 | Source: Ambulatory Visit | Attending: Obstetrics and Gynecology | Admitting: Obstetrics and Gynecology

## 2014-09-11 ENCOUNTER — Encounter (HOSPITAL_COMMUNITY): Payer: Self-pay

## 2014-09-11 DIAGNOSIS — R109 Unspecified abdominal pain: Secondary | ICD-10-CM | POA: Diagnosis not present

## 2014-09-11 DIAGNOSIS — O9989 Other specified diseases and conditions complicating pregnancy, childbirth and the puerperium: Secondary | ICD-10-CM | POA: Diagnosis present

## 2014-09-11 DIAGNOSIS — Z3A38 38 weeks gestation of pregnancy: Secondary | ICD-10-CM | POA: Diagnosis not present

## 2014-09-11 LAB — CBC
HCT: 33.4 % — ABNORMAL LOW (ref 36.0–46.0)
Hemoglobin: 11.7 g/dL — ABNORMAL LOW (ref 12.0–15.0)
MCH: 33 pg (ref 26.0–34.0)
MCHC: 35 g/dL (ref 30.0–36.0)
MCV: 94.1 fL (ref 78.0–100.0)
Platelets: 148 10*3/uL — ABNORMAL LOW (ref 150–400)
RBC: 3.55 MIL/uL — ABNORMAL LOW (ref 3.87–5.11)
RDW: 13.3 % (ref 11.5–15.5)
WBC: 11.1 10*3/uL — ABNORMAL HIGH (ref 4.0–10.5)

## 2014-09-11 LAB — COMPREHENSIVE METABOLIC PANEL
ALBUMIN: 2.7 g/dL — AB (ref 3.5–5.2)
ALK PHOS: 159 U/L — AB (ref 39–117)
ALT: 19 U/L (ref 0–35)
AST: 23 U/L (ref 0–37)
Anion gap: 13 (ref 5–15)
BUN: 5 mg/dL — ABNORMAL LOW (ref 6–23)
CHLORIDE: 103 meq/L (ref 96–112)
CO2: 21 mEq/L (ref 19–32)
Calcium: 8.9 mg/dL (ref 8.4–10.5)
Creatinine, Ser: 0.45 mg/dL — ABNORMAL LOW (ref 0.50–1.10)
GFR calc Af Amer: >90 mL/min (ref >90)
GFR calc non Af Amer: >90 mL/min (ref >90)
Glucose, Bld: 93 mg/dL (ref 70–99)
POTASSIUM: 3.4 meq/L — AB (ref 3.7–5.3)
Sodium: 137 mEq/L (ref 137–147)
Total Bilirubin: <0.2 mg/dL — ABNORMAL LOW (ref 0.3–1.2)
Total Protein: 6 g/dL (ref 6.0–8.3)

## 2014-09-11 LAB — LACTATE DEHYDROGENASE: LDH: 231 U/L (ref 94–250)

## 2014-09-11 LAB — URIC ACID: Uric Acid, Serum: 4.3 mg/dL (ref 2.4–7.0)

## 2014-09-11 NOTE — Discharge Instructions (Signed)
Braxton Hicks Contractions Contractions of the uterus can occur throughout pregnancy. Contractions are not always a sign that you are in labor.  WHAT ARE BRAXTON HICKS CONTRACTIONS?  Contractions that occur before labor are called Braxton Hicks contractions, or false labor. Toward the end of pregnancy (32-34 weeks), these contractions can develop more often and may become more forceful. This is not true labor because these contractions do not result in opening (dilatation) and thinning of the cervix. They are sometimes difficult to tell apart from true labor because these contractions can be forceful and people have different pain tolerances. You should not feel embarrassed if you go to the hospital with false labor. Sometimes, the only way to tell if you are in true labor is for your health care provider to look for changes in the cervix. If there are no prenatal problems or other health problems associated with the pregnancy, it is completely safe to be sent home with false labor and await the onset of true labor. HOW CAN YOU TELL THE DIFFERENCE BETWEEN TRUE AND FALSE LABOR? False Labor  The contractions of false labor are usually shorter and not as hard as those of true labor.   The contractions are usually irregular.   The contractions are often felt in the front of the lower abdomen and in the groin.   The contractions may go away when you walk around or change positions while lying down.   The contractions get weaker and are shorter lasting as time goes on.   The contractions do not usually become progressively stronger, regular, and closer together as with true labor.  True Labor  Contractions in true labor last 30-70 seconds, become very regular, usually become more intense, and increase in frequency.   The contractions do not go away with walking.   The discomfort is usually felt in the top of the uterus and spreads to the lower abdomen and low back.   True labor can be  determined by your health care provider with an exam. This will show that the cervix is dilating and getting thinner.  WHAT TO REMEMBER  Keep up with your usual exercises and follow other instructions given by your health care provider.   Take medicines as directed by your health care provider.   Keep your regular prenatal appointments.   Eat and drink lightly if you think you are going into labor.   If Braxton Hicks contractions are making you uncomfortable:   Change your position from lying down or resting to walking, or from walking to resting.   Sit and rest in a tub of warm water.   Drink 2-3 glasses of water. Dehydration may cause these contractions.   Do slow and deep breathing several times an hour.  WHEN SHOULD I SEEK IMMEDIATE MEDICAL CARE? Seek immediate medical care if:  Your contractions become stronger, more regular, and closer together.   You have fluid leaking or gushing from your vagina.   You have a fever.   You pass blood-tinged mucus.   You have vaginal bleeding.   You have continuous abdominal pain.   You have low back pain that you never had before.   You feel your baby's head pushing down and causing pelvic pressure.   Your baby is not moving as much as it used to.  Document Released: 11/21/2005 Document Revised: 11/26/2013 Document Reviewed: 09/02/2013 Exit[employer] Patient Information 2015 [employer]. This information is not intended to replace advice given to you by your health care   provider. Make sure you discuss any questions you have with your health care provider.  Fetal Movement Counts Patient Name: __________________________________________________ Patient Due Date: ____________________ Performing a fetal movement count is highly recommended in high-risk pregnancies, but it is good for every pregnant woman to do. Your health care provider may ask you to start counting fetal movements at 28 weeks of the pregnancy. Fetal  movements often increase:  After eating a full meal.  After physical activity.  After eating or drinking something sweet or cold.  At rest. Pay attention to when you feel the baby is most active. This will help you notice a pattern of your baby's sleep and wake cycles and what factors contribute to an increase in fetal movement. It is important to perform a fetal movement count at the same time each day when your baby is normally most active.  HOW TO COUNT FETAL MOVEMENTS 1. Find a quiet and comfortable area to sit or lie down on your left side. Lying on your left side provides the best blood and oxygen circulation to your baby. 2. Write down the day and time on a sheet of paper or in a journal. 3. Start counting kicks, flutters, swishes, rolls, or jabs in a 2-hour period. You should feel at least 10 movements within 2 hours. 4. If you do not feel 10 movements in 2 hours, wait 2-3 hours and count again. Look for a change in the pattern or not enough counts in 2 hours. SEEK MEDICAL CARE IF:  You feel less than 10 counts in 2 hours, tried twice.  There is no movement in over an hour.  The pattern is changing or taking longer each day to reach 10 counts in 2 hours.  You feel the baby is not moving as he or she usually does. Date: ____________ Movements: ____________ Start time: ____________ Finish time: ____________  Date: ____________ Movements: ____________ Start time: ____________ Finish time: ____________ Date: ____________ Movements: ____________ Start time: ____________ Finish time: ____________ Date: ____________ Movements: ____________ Start time: ____________ Finish time: ____________ Date: ____________ Movements: ____________ Start time: ____________ Finish time: ____________ Date: ____________ Movements: ____________ Start time: ____________ Finish time: ____________ Date: ____________ Movements: ____________ Start time: ____________ Finish time: ____________ Date: ____________  Movements: ____________ Start time: ____________ Finish time: ____________  Date: ____________ Movements: ____________ Start time: ____________ Finish time: ____________ Date: ____________ Movements: ____________ Start time: ____________ Finish time: ____________ Date: ____________ Movements: ____________ Start time: ____________ Finish time: ____________ Date: ____________ Movements: ____________ Start time: ____________ Finish time: ____________ Date: ____________ Movements: ____________ Start time: ____________ Finish time: ____________ Date: ____________ Movements: ____________ Start time: ____________ Finish time: ____________ Date: ____________ Movements: ____________ Start time: ____________ Finish time: ____________  Date: ____________ Movements: ____________ Start time: ____________ Finish time: ____________ Date: ____________ Movements: ____________ Start time: ____________ Finish time: ____________ Date: ____________ Movements: ____________ Start time: ____________ Finish time: ____________ Date: ____________ Movements: ____________ Start time: ____________ Finish time: ____________ Date: ____________ Movements: ____________ Start time: ____________ Finish time: ____________ Date: ____________ Movements: ____________ Start time: ____________ Finish time: ____________ Date: ____________ Movements: ____________ Start time: ____________ Finish time: ____________  Date: ____________ Movements: ____________ Start time: ____________ Finish time: ____________ Date: ____________ Movements: ____________ Start time: ____________ Finish time: ____________ Date: ____________ Movements: ____________ Start time: ____________ Finish time: ____________ Date: ____________ Movements: ____________ Start time: ____________ Finish time: ____________ Date: ____________ Movements: ____________ Start time: ____________ Finish time: ____________ Date: ____________ Movements: ____________ Start time:  ____________ Finish time: ____________ Date: ____________ Movements:   ____________ Start time: ____________ Finish time: ____________  Date: ____________ Movements: ____________ Start time: ____________ Finish time: ____________ Date: ____________ Movements: ____________ Start time: ____________ Finish time: ____________ Date: ____________ Movements: ____________ Start time: ____________ Finish time: ____________ Date: ____________ Movements: ____________ Start time: ____________ Finish time: ____________ Date: ____________ Movements: ____________ Start time: ____________ Finish time: ____________ Date: ____________ Movements: ____________ Start time: ____________ Finish time: ____________ Date: ____________ Movements: ____________ Start time: ____________ Finish time: ____________  Date: ____________ Movements: ____________ Start time: ____________ Finish time: ____________ Date: ____________ Movements: ____________ Start time: ____________ Finish time: ____________ Date: ____________ Movements: ____________ Start time: ____________ Finish time: ____________ Date: ____________ Movements: ____________ Start time: ____________ Finish time: ____________ Date: ____________ Movements: ____________ Start time: ____________ Finish time: ____________ Date: ____________ Movements: ____________ Start time: ____________ Finish time: ____________ Date: ____________ Movements: ____________ Start time: ____________ Finish time: ____________  Date: ____________ Movements: ____________ Start time: ____________ Finish time: ____________ Date: ____________ Movements: ____________ Start time: ____________ Finish time: ____________ Date: ____________ Movements: ____________ Start time: ____________ Finish time: ____________ Date: ____________ Movements: ____________ Start time: ____________ Finish time: ____________ Date: ____________ Movements: ____________ Start time: ____________ Finish time: ____________ Date:  ____________ Movements: ____________ Start time: ____________ Finish time: ____________ Date: ____________ Movements: ____________ Start time: ____________ Finish time: ____________  Date: ____________ Movements: ____________ Start time: ____________ Finish time: ____________ Date: ____________ Movements: ____________ Start time: ____________ Finish time: ____________ Date: ____________ Movements: ____________ Start time: ____________ Finish time: ____________ Date: ____________ Movements: ____________ Start time: ____________ Finish time: ____________ Date: ____________ Movements: ____________ Start time: ____________ Finish time: ____________ Date: ____________ Movements: ____________ Start time: ____________ Finish time: ____________ Document Released: 12/21/2006 Document Revised: 04/07/2014 Document Reviewed: 09/17/2012 Exit[employer] Patient Information 2015 [employer]. This information is not intended to replace advice given to you by your health care provider. Make sure you discuss any questions you have with your health care provider.  

## 2014-09-11 NOTE — MAU Note (Signed)
Pt reports really sharp pains and cramping all afternoon, worsening. Denies bleeding or ROM

## 2014-09-17 ENCOUNTER — Inpatient Hospital Stay (HOSPITAL_COMMUNITY)
Admission: AD | Admit: 2014-09-17 | Discharge: 2014-09-19 | DRG: 775 | Disposition: A | Discharge status: Home / Self Care | Payer: 59 | Source: Ambulatory Visit | Attending: Obstetrics & Gynecology | Admitting: Obstetrics & Gynecology

## 2014-09-17 ENCOUNTER — Encounter (HOSPITAL_COMMUNITY): Payer: Self-pay | Admitting: *Deleted

## 2014-09-17 ENCOUNTER — Telehealth (HOSPITAL_COMMUNITY): Payer: Self-pay | Admitting: *Deleted

## 2014-09-17 ENCOUNTER — Encounter (HOSPITAL_COMMUNITY): Payer: Self-pay

## 2014-09-17 DIAGNOSIS — F419 Anxiety disorder, unspecified: Secondary | ICD-10-CM | POA: Diagnosis present

## 2014-09-17 DIAGNOSIS — O99344 Other mental disorders complicating childbirth: Secondary | ICD-10-CM | POA: Diagnosis present

## 2014-09-17 DIAGNOSIS — Z3490 Encounter for supervision of normal pregnancy, unspecified, unspecified trimester: Secondary | ICD-10-CM

## 2014-09-17 DIAGNOSIS — L301 Dyshidrosis [pompholyx]: Secondary | ICD-10-CM

## 2014-09-17 DIAGNOSIS — Z833 Family history of diabetes mellitus: Secondary | ICD-10-CM

## 2014-09-17 DIAGNOSIS — Z8249 Family history of ischemic heart disease and other diseases of the circulatory system: Secondary | ICD-10-CM

## 2014-09-17 DIAGNOSIS — Z3A39 39 weeks gestation of pregnancy: Secondary | ICD-10-CM | POA: Diagnosis present

## 2014-09-17 LAB — OB RESULTS CONSOLE GBS: GBS: NEGATIVE

## 2014-09-17 NOTE — MAU Note (Signed)
Contractions, some spotting

## 2014-09-17 NOTE — Telephone Encounter (Signed)
Preadmission screen  

## 2014-09-18 ENCOUNTER — Inpatient Hospital Stay (HOSPITAL_COMMUNITY): Admission: RE | Admit: 2014-09-18 | Payer: 59 | Source: Ambulatory Visit

## 2014-09-18 ENCOUNTER — Inpatient Hospital Stay (HOSPITAL_COMMUNITY): Payer: 59 | Admitting: Anesthesiology

## 2014-09-18 ENCOUNTER — Encounter (HOSPITAL_COMMUNITY): Payer: Self-pay

## 2014-09-18 ENCOUNTER — Encounter (HOSPITAL_COMMUNITY): Payer: 59 | Admitting: Anesthesiology

## 2014-09-18 DIAGNOSIS — O471 False labor at or after 37 completed weeks of gestation: Secondary | ICD-10-CM | POA: Diagnosis present

## 2014-09-18 DIAGNOSIS — F419 Anxiety disorder, unspecified: Secondary | ICD-10-CM | POA: Diagnosis present

## 2014-09-18 DIAGNOSIS — Z833 Family history of diabetes mellitus: Secondary | ICD-10-CM | POA: Diagnosis not present

## 2014-09-18 DIAGNOSIS — Z3490 Encounter for supervision of normal pregnancy, unspecified, unspecified trimester: Secondary | ICD-10-CM

## 2014-09-18 DIAGNOSIS — Z3A39 39 weeks gestation of pregnancy: Secondary | ICD-10-CM | POA: Diagnosis present

## 2014-09-18 DIAGNOSIS — Z8249 Family history of ischemic heart disease and other diseases of the circulatory system: Secondary | ICD-10-CM | POA: Diagnosis not present

## 2014-09-18 DIAGNOSIS — O99344 Other mental disorders complicating childbirth: Secondary | ICD-10-CM | POA: Diagnosis present

## 2014-09-18 LAB — COMPREHENSIVE METABOLIC PANEL
ALT: 23 U/L (ref 0–35)
AST: 37 U/L (ref 0–37)
Albumin: 2.9 g/dL — ABNORMAL LOW (ref 3.5–5.2)
Alkaline Phosphatase: 175 U/L — ABNORMAL HIGH (ref 39–117)
Anion gap: 15 (ref 5–15)
BUN: 7 mg/dL (ref 6–23)
CO2: 21 meq/L (ref 19–32)
CREATININE: 0.54 mg/dL (ref 0.50–1.10)
Calcium: 9.1 mg/dL (ref 8.4–10.5)
Chloride: 103 mEq/L (ref 96–112)
GLUCOSE: 88 mg/dL (ref 70–99)
Potassium: 3.7 mEq/L (ref 3.7–5.3)
Sodium: 139 mEq/L (ref 137–147)
TOTAL PROTEIN: 6.2 g/dL (ref 6.0–8.3)
Total Bilirubin: 0.2 mg/dL — ABNORMAL LOW (ref 0.3–1.2)

## 2014-09-18 LAB — TYPE AND SCREEN
ABO/RH(D): O POS
Antibody Screen: NEGATIVE

## 2014-09-18 LAB — CBC
HEMATOCRIT: 33.5 % — AB (ref 36.0–46.0)
HEMATOCRIT: 33.3 % — AB (ref 36.0–46.0)
Hemoglobin: 11.6 g/dL — ABNORMAL LOW (ref 12.0–15.0)
Hemoglobin: 11.3 g/dL — ABNORMAL LOW (ref 12.0–15.0)
MCH: 32.5 pg (ref 26.0–34.0)
MCH: 32 pg (ref 26.0–34.0)
MCHC: 34.6 g/dL (ref 30.0–36.0)
MCHC: 33.9 g/dL (ref 30.0–36.0)
MCV: 93.8 fL (ref 78.0–100.0)
MCV: 94.3 fL (ref 78.0–100.0)
Platelets: 136 10*3/uL — ABNORMAL LOW (ref 150–400)
Platelets: 130 10*3/uL — ABNORMAL LOW (ref 150–400)
RBC: 3.57 MIL/uL — ABNORMAL LOW (ref 3.87–5.11)
RBC: 3.53 MIL/uL — AB (ref 3.87–5.11)
RDW: 13.5 % (ref 11.5–15.5)
RDW: 13.5 % (ref 11.5–15.5)
WBC: 12.4 10*3/uL — AB (ref 4.0–10.5)
WBC: 14.1 10*3/uL — ABNORMAL HIGH (ref 4.0–10.5)

## 2014-09-18 LAB — RPR

## 2014-09-18 MED ORDER — SIMETHICONE 80 MG PO CHEW: 80.0000 mg | CHEWABLE_TABLET | ORAL | Status: DC | PRN

## 2014-09-18 MED ORDER — PHENYLEPHRINE 40 MCG/ML (10ML) SYRINGE FOR IV PUSH (FOR BLOOD PRESSURE SUPPORT)
80.0000 ug | PREFILLED_SYRINGE | INTRAVENOUS | Status: DC | PRN
  Filled 2014-09-18: qty 2

## 2014-09-18 MED ORDER — FENTANYL 2.5 MCG/ML BUPIVACAINE 1/10 % EPIDURAL INFUSION (WH - ANES)
14.0000 mL/h | INTRAMUSCULAR | Status: DC | PRN
  Administered 2014-09-18: 14 mL/h via EPIDURAL
  Filled 2014-09-18: qty 125

## 2014-09-18 MED ORDER — ACETAMINOPHEN 325 MG PO TABS: 650.0000 mg | ORAL_TABLET | ORAL | Status: DC | PRN

## 2014-09-18 MED ORDER — LACTATED RINGERS IV SOLN
INTRAVENOUS | Status: DC
  Administered 2014-09-18: 125 mL/h via INTRAVENOUS

## 2014-09-18 MED ORDER — OXYCODONE-ACETAMINOPHEN 5-325 MG PO TABS: 2.0000 | ORAL_TABLET | ORAL | Status: DC | PRN

## 2014-09-18 MED ORDER — LIDOCAINE HCL (PF) 1 % IJ SOLN
30.0000 mL | INTRAMUSCULAR | Status: DC | PRN
  Filled 2014-09-18: qty 30

## 2014-09-18 MED ORDER — LANOLIN HYDROUS EX OINT
TOPICAL_OINTMENT | CUTANEOUS | Status: DC | PRN
Start: 2014-09-18 — End: 2014-09-19

## 2014-09-18 MED ORDER — DIPHENHYDRAMINE HCL 25 MG PO CAPS: 25.0000 mg | ORAL_CAPSULE | Freq: Four times a day (QID) | ORAL | Status: DC | PRN

## 2014-09-18 MED ORDER — FLEET ENEMA 7-19 GM/118ML RE ENEM: 1.0000 | ENEMA | RECTAL | Status: DC | PRN

## 2014-09-18 MED ORDER — DIPHENHYDRAMINE HCL 50 MG/ML IJ SOLN: 12.5000 mg | INTRAMUSCULAR | Status: DC | PRN

## 2014-09-18 MED ORDER — FENTANYL 2.5 MCG/ML BUPIVACAINE 1/10 % EPIDURAL INFUSION (WH - ANES)
INTRAMUSCULAR | Status: DC | PRN
  Administered 2014-09-18: 14 mL/h via EPIDURAL

## 2014-09-18 MED ORDER — CITRIC ACID-SODIUM CITRATE 334-500 MG/5ML PO SOLN: 30.0000 mL | ORAL | Status: DC | PRN

## 2014-09-18 MED ORDER — ONDANSETRON HCL 4 MG/2ML IJ SOLN: 4.0000 mg | INTRAMUSCULAR | Status: DC | PRN

## 2014-09-18 MED ORDER — OXYTOCIN BOLUS FROM INFUSION
500.0000 mL | INTRAVENOUS | Status: DC
  Administered 2014-09-18: 500 mL via INTRAVENOUS

## 2014-09-18 MED ORDER — ZOLPIDEM TARTRATE 5 MG PO TABS: 5.0000 mg | ORAL_TABLET | Freq: Every evening | ORAL | Status: DC | PRN

## 2014-09-18 MED ORDER — LIDOCAINE HCL (PF) 1 % IJ SOLN
INTRAMUSCULAR | Status: DC | PRN
  Administered 2014-09-18 (×2): 4 mL

## 2014-09-18 MED ORDER — LACTATED RINGERS IV SOLN
500.0000 mL | INTRAVENOUS | Status: DC | PRN
  Administered 2014-09-18: 500 mL via INTRAVENOUS

## 2014-09-18 MED ORDER — OXYCODONE-ACETAMINOPHEN 5-325 MG PO TABS: 1.0000 | ORAL_TABLET | ORAL | Status: DC | PRN

## 2014-09-18 MED ORDER — OXYCODONE-ACETAMINOPHEN 5-325 MG PO TABS
1.0000 | ORAL_TABLET | ORAL | Status: DC | PRN
  Administered 2014-09-18 – 2014-09-19 (×2): 1 via ORAL
  Filled 2014-09-18 (×2): qty 1

## 2014-09-18 MED ORDER — WITCH HAZEL-GLYCERIN EX PADS: 1.0000 "application " | MEDICATED_PAD | CUTANEOUS | Status: DC | PRN

## 2014-09-18 MED ORDER — ONDANSETRON HCL 4 MG PO TABS
4.0000 mg | ORAL_TABLET | ORAL | Status: DC | PRN
  Administered 2014-09-19: 4 mg via ORAL
  Filled 2014-09-18 (×2): qty 1

## 2014-09-18 MED ORDER — ONDANSETRON HCL 4 MG/2ML IJ SOLN
4.0000 mg | Freq: Four times a day (QID) | INTRAMUSCULAR | Status: DC | PRN
  Administered 2014-09-18: 4 mg via INTRAVENOUS
  Filled 2014-09-18: qty 2

## 2014-09-18 MED ORDER — DIBUCAINE 1 % RE OINT: 1.0000 "application " | TOPICAL_OINTMENT | RECTAL | Status: DC | PRN

## 2014-09-18 MED ORDER — SENNOSIDES-DOCUSATE SODIUM 8.6-50 MG PO TABS
2.0000 | ORAL_TABLET | ORAL | Status: DC
  Administered 2014-09-19: 2 via ORAL
  Filled 2014-09-18: qty 2

## 2014-09-18 MED ORDER — IBUPROFEN 600 MG PO TABS
600.0000 mg | ORAL_TABLET | Freq: Four times a day (QID) | ORAL | Status: DC
  Administered 2014-09-18 – 2014-09-19 (×6): 600 mg via ORAL
  Filled 2014-09-18 (×6): qty 1

## 2014-09-18 MED ORDER — LACTATED RINGERS IV SOLN
500.0000 mL | Freq: Once | INTRAVENOUS | Status: AC
  Administered 2014-09-18: 500 mL via INTRAVENOUS

## 2014-09-18 MED ORDER — SERTRALINE HCL 50 MG PO TABS
50.0000 mg | ORAL_TABLET | Freq: Every day | ORAL | Status: DC
  Administered 2014-09-18: 50 mg via ORAL
  Filled 2014-09-18 (×3): qty 1

## 2014-09-18 MED ORDER — BENZOCAINE-MENTHOL 20-0.5 % EX AERO
1.0000 "application " | INHALATION_SPRAY | CUTANEOUS | Status: DC | PRN
  Administered 2014-09-18: 1 via TOPICAL
  Filled 2014-09-18: qty 56

## 2014-09-18 MED ORDER — EPHEDRINE 5 MG/ML INJ
10.0000 mg | INTRAVENOUS | Status: DC | PRN
  Filled 2014-09-18: qty 2

## 2014-09-18 MED ORDER — PHENYLEPHRINE 40 MCG/ML (10ML) SYRINGE FOR IV PUSH (FOR BLOOD PRESSURE SUPPORT)
80.0000 ug | PREFILLED_SYRINGE | INTRAVENOUS | Status: DC | PRN
  Filled 2014-09-18: qty 10
  Filled 2014-09-18: qty 2

## 2014-09-18 MED ORDER — OXYTOCIN 40 UNITS IN LACTATED RINGERS INFUSION - SIMPLE MED
62.5000 mL/h | INTRAVENOUS | Status: DC
  Administered 2014-09-18: 62.5 mL/h via INTRAVENOUS
  Filled 2014-09-18: qty 1000

## 2014-09-18 MED ORDER — PRENATAL MULTIVITAMIN CH
1.0000 | ORAL_TABLET | Freq: Every day | ORAL | Status: DC
  Administered 2014-09-18 – 2014-09-19 (×2): 1 via ORAL
  Filled 2014-09-18 (×2): qty 1

## 2014-09-18 MED ORDER — TETANUS-DIPHTH-ACELL PERTUSSIS 5-2.5-18.5 LF-MCG/0.5 IM SUSP: 0.5000 mL | Freq: Once | INTRAMUSCULAR | Status: DC

## 2014-09-18 NOTE — MAU Note (Signed)
Reviewed tracing with Aleta charge RN

## 2014-09-18 NOTE — H&P (Signed)
[name] Sanchez is a 18 y.o. female presenting for labor.  Patient changed from 4 to 5cm in MAU.  No LOF or VB.  Active FM.  Antepartum course complicated by migraines and anxiety.  Last pregnancy was complicated by pp Pre-eclampsia.  GBS negative.  Currently comfortable with epidural.  Maternal Medical History:  Reason for admission: Contractions.   Contractions: Onset was 6-12 hours ago.   Frequency: regular.   Perceived severity is moderate.    Fetal activity: Perceived fetal activity is normal.   Last perceived fetal movement was within the past hour.    Prenatal complications: no prenatal complications Prenatal Complications - Diabetes: none.    OB History   Grav Para Term Preterm Abortions TAB SAB Ect Mult Living   3 1 1  1 1    1      Past Medical History  Diagnosis Date  . Hx of ovarian cyst     x 2-3  . Gestational hypertension 01/2012    also had postpartum preelampsia  . Plica of knee 08/2012    medial plica left knee  . Migraine   . Anxiety   . Panic attack   . Hx of varicella    Past Surgical History  Procedure Laterality Date  . Tympanostomy tube placement    . Knee arthroscopy  08/17/2012    Procedure: ARTHROSCOPY KNEE;  Surgeon: Harvie JuniorJohn L Graves, MD;  Location: [facility];  Service: Orthopedics;  Laterality: Left;  left knee scope with tight lateral retinacular release and plica removal   Family History: family history includes Anesthesia problems in her mother; Asthma in her brother and sister; Bipolar disorder in her father and sister; Diabetes in her maternal aunt; Heart disease in her father and mother; Hypertension in her mother. Social History:  reports that she has never smoked. She has never used smokeless tobacco. She reports that she does not drink alcohol or use illicit drugs.   Prenatal Transfer Tool  Maternal Diabetes: No Genetic Screening: Normal Maternal Ultrasounds/Referrals: Normal Fetal Ultrasounds or other Referrals:   None Maternal Substance Abuse:  No Significant Maternal Medications:  None Significant Maternal Lab Results:  None Other Comments:  None  ROS  Dilation: 5.5 Effacement (%): 70 Station: -3 Exam by:: D Simpson RN Blood pressure 114/52, pulse 70, temperature 98.1 F (36.7 C), temperature source Oral, resp. rate 16, height 5\' 4"  (1.626 m), weight 203 lb (92.08 kg), last menstrual period 11/15/2013, SpO2 100.00%. Maternal Exam:  Uterine Assessment: Contraction strength is moderate.  Contraction frequency is regular.   Abdomen: Patient reports no abdominal tenderness. Fundal height is c/w dates.   Estimated fetal weight is 7#6.   Fetal presentation: vertex  Introitus: Normal vulva. Normal vagina.  Pelvis: adequate for delivery.   Cervix: Cervix evaluated by sterile speculum exam.     Physical Exam  Constitutional: She is oriented to person, place, and time. She appears well-developed and well-nourished.  GI: Soft. There is no rebound and no guarding.  Neurological: She is alert and oriented to person, place, and time.  Skin: Skin is warm and dry.  Psychiatric: She has a normal mood and affect. Her behavior is normal.    Prenatal labs: ABO, Rh: --/--/O POS (10/15 0110) Antibody: NEG (10/15 0110) Rubella: Immune (03/19 0000) RPR: Nonreactive (03/19 0000)  HBsAg: Negative (03/19 0000)  HIV: Non-reactive (03/19 0000)  GBS: Negative (10/14 0000)   Assessment/Plan: 18yo G3P1011 at 20104w0d with labor -GBS negative -Anticipate NSVD    [name] Sanchez,  [name] Sanchez 09/18/2014, 3:15 AM

## 2014-09-18 NOTE — Anesthesia Procedure Notes (Signed)
Epidural Patient location during procedure: OB  Staffing Anesthesiologist: Sharonica Kraszewski R Performed by: anesthesiologist   Preanesthetic Checklist Completed: patient identified, pre-op evaluation, timeout performed, IV checked, risks and benefits discussed and monitors and equipment checked  Epidural Patient position: sitting Prep: site prepped and draped and DuraPrep Patient monitoring: heart rate Approach: midline Location: L3-L4 Injection technique: LOR air and LOR saline  Needle:  Needle type: Tuohy  Needle gauge: 17 G Needle length: 9 cm Needle insertion depth: 5 cm Catheter type: closed end flexible Catheter size: 19 Gauge Catheter at skin depth: 11 cm Test dose: negative  Assessment Sensory level: T8 Events: blood not aspirated, injection not painful, no injection resistance, negative IV test and no paresthesia  Additional Notes Reason for block:procedure for pain   

## 2014-09-18 NOTE — Addendum Note (Signed)
Addendum created 09/18/14 0755 by Suella Groveoderick C Beryl Balz, CRNA   Modules edited: Charges VN, Notes Section   Notes Section:  File: [id number]

## 2014-09-18 NOTE — Anesthesia Preprocedure Evaluation (Signed)
Anesthesia Evaluation  Patient identified by MRN, date of birth, ID band Patient awake    Reviewed: Allergy & Precautions, H&P , NPO status , Patient's Chart, lab work & pertinent test results, reviewed documented beta blocker date and time   Airway Mallampati: II TM Distance: >3 FB Neck ROM: full    Dental   Pulmonary neg pulmonary ROS,  breath sounds clear to auscultation        Cardiovascular hypertension, Rhythm:regular     Neuro/Psych  Headaches, negative psych ROS   GI/Hepatic negative GI ROS, Neg liver ROS,   Endo/Other  negative endocrine ROS  Renal/GU negative Renal ROS  negative genitourinary   Musculoskeletal   Abdominal   Peds  Hematology negative hematology ROS (+)   Anesthesia Other Findings See surgeon's H&P   Reproductive/Obstetrics negative OB ROS                           Anesthesia Physical  Anesthesia Plan  ASA: II  Anesthesia Plan: Epidural   Post-op Pain Management:    Induction:   Airway Management Planned:   Additional Equipment:   Intra-op Plan:   Post-operative Plan:   Informed Consent: I have reviewed the patients History and Physical, chart, labs and discussed the procedure including the risks, benefits and alternatives for the proposed anesthesia with the patient or authorized representative who has indicated his/her understanding and acceptance.     Plan Discussed with:   Anesthesia Plan Comments:         Anesthesia Quick Evaluation

## 2014-09-18 NOTE — Progress Notes (Signed)
Post Partum Day 0 Subjective: no complaints  Objective: Blood pressure 125/71, pulse 68, temperature 98.2 F (36.8 C), temperature source Oral, resp. rate 18, height 5\' 4"  (1.626 m), weight 203 lb (92.08 kg), last menstrual period 11/15/2013, SpO2 87.00%, unknown if currently breastfeeding.  Physical Exam:  General: alert Lochia: appropriate Uterine Fundus: firm Incision: healing well DVT Evaluation: No evidence of DVT seen on physical exam.   Recent Labs  09/18/14 0110 09/18/14 0535  HGB 11.6* 11.3*  HCT 33.5* 33.3*    Assessment/Plan: Plan for discharge tomorrow   LOS: 1 day   Nabilah Davoli M 09/18/2014, 9:07 AM

## 2014-09-18 NOTE — Lactation Note (Signed)
This note was copied from the chart of [name] Darletta Mollshley Pacetti. Lactation Consultation Note  Patient Name: [name] Sanchez ZOXWR'UToday's Date: 09/18/2014 Reason for consult: Initial assessment;Difficult latch with initial LATCH score=6 due to both mom and baby needing assistance. Mom is 18 yo second-time mom who did not breastfeed but a few days with first child, now 352 yo.  This new baby was having some latching difficulty and mom was able to hand express and use hand pump to obtain 10 ml's for baby.  She has personal Medela double electric pump at home but needed DEBP at bedside for use if latching difficulty persists.  Mom will call for assistance from nurse or LC, if needed. LC provided DEBP with instructions for use and cleaning, as well as milk storage guidelines.  LC encouraged frequent STS and cue feedings.  Mom encouraged to feed baby 8-12 times/24 hours and with feeding cues. LC encouraged review of Baby and Me pp 9, 14 and 20-25 for STS and BF information. LC provided [employer] [facility] Resource brochure and reviewed [facility] and list of community and web site resources.   Maternal Data Formula Feeding for Exclusion: No Has patient been taught Hand Expression?: Yes (mom states she knows how to hand express colostrum/milk and used symphony pump with first baby) Does the patient have breastfeeding experience prior to this delivery?: Yes  Feeding Feeding Type: Bottle Fed - Breast Milk  LATCH Score/Interventions            LATCH score=6          Lactation Tools Discussed/Used WIC Program: No Pump Review: Setup, frequency, and cleaning;Milk Storage Initiated by:: LC Date initiated:: 09/19/14 STS, cue feedings, hand expression  Consult Status Consult Status: Follow-up Date: 09/19/14 Follow-up type: In-patient    Warrick ParisianBryant, Delesa Kawa [facility] 09/18/2014, 6:27 PM

## 2014-09-18 NOTE — Anesthesia Postprocedure Evaluation (Signed)
  Anesthesia Post-op Note  Patient: [name] Sanchez  Procedure(s) Performed: * No procedures listed *  Patient Location: Mother/Baby  Anesthesia Type:Epidural  Level of Consciousness: awake, alert  and patient cooperative  Airway and Oxygen Therapy: Patient Spontanous Breathing  Post-op Pain: none  Post-op Assessment: Post-op Vital signs reviewed, Patient's Cardiovascular Status Stable, Respiratory Function Stable, Patent Airway, No signs of Nausea or vomiting, Adequate PO intake, Pain level controlled, No headache, No backache, No residual numbness and No residual motor weakness  Post-op Vital Signs: Reviewed and stable  Last Vitals:  Filed Vitals:   09/18/14 0743  BP: 125/71  Pulse: 68  Temp: 36.8 C  Resp: 18    Complications: No apparent anesthesia complications

## 2014-09-19 LAB — CBC
HEMATOCRIT: 30.9 % — AB (ref 36.0–46.0)
Hemoglobin: 10.6 g/dL — ABNORMAL LOW (ref 12.0–15.0)
MCH: 32.8 pg (ref 26.0–34.0)
MCHC: 34.3 g/dL (ref 30.0–36.0)
MCV: 95.7 fL (ref 78.0–100.0)
PLATELETS: 122 10*3/uL — AB (ref 150–400)
RBC: 3.23 MIL/uL — ABNORMAL LOW (ref 3.87–5.11)
RDW: 13.6 % (ref 11.5–15.5)
WBC: 11.9 10*3/uL — ABNORMAL HIGH (ref 4.0–10.5)

## 2014-09-19 NOTE — Lactation Note (Signed)
This note was copied from the chart of [name] Darletta Mollshley Morace. Lactation Consultation Note  Mother latched baby in football position.  Mother need some assistance with depth.  Demonstrated chin tug. Sucks and swallows observed.  Suggested breast massage to keep him active. Reviewed engorgement care and monitoring voids/stools. Mom encouraged to feed baby 8-12 times/24 hours and with feeding cues.    Patient Name: [name] Darletta Mollshley Gaxiola WUJWJ'XToday's Date: 09/19/2014 Reason for consult: Follow-up assessment   Maternal Data    Feeding Feeding Type: Breast Fed Length of feed: 10 min  LATCH Score/Interventions Latch: Grasps breast easily, tongue down, lips flanged, rhythmical sucking. Intervention(s): Adjust position;Assist with latch;Breast massage  Audible Swallowing: A few with stimulation  Type of Nipple: Everted at rest and after stimulation  Comfort (Breast/Nipple): Filling, red/small blisters or bruises, mild/mod discomfort  Problem noted: Mild/Moderate discomfort Interventions (Mild/moderate discomfort): Comfort gels;Hand expression  Hold (Positioning): No assistance needed to correctly position infant at breast.  LATCH Score: 8  Lactation Tools Discussed/Used     Consult Status Consult Status: PRN    Hardie PulleyBerkelhammer, Ruth Boschen 09/19/2014, 10:50 AM

## 2014-09-19 NOTE — Discharge Summary (Signed)
Obstetric Discharge Summary Reason for Admission: onset of labor Prenatal Procedures: none Intrapartum Procedures: spontaneous vaginal delivery Postpartum Procedures: none Complications-Operative and Postpartum: none Hemoglobin  Date Value Ref Range Status  09/19/2014 10.6* 12.0 - 15.0 g/dL Final     HCT  Date Value Ref Range Status  09/19/2014 30.9* 36.0 - 46.0 % Final    Physical Exam:  General: alert, cooperative and appears stated age 4Lochia: appropriate Uterine Fundus: firm Incision: healing well, no significant drainage, no dehiscence DVT Evaluation: No evidence of DVT seen on physical exam.  Discharge Diagnoses: Term Pregnancy-delivered  Discharge Information: Date: 09/19/2014 Activity: pelvic rest Diet: routine Medications: None Condition: stable Instructions: refer to practice specific booklet Discharge to: home   Newborn Data: Live born female  Birth Weight: 8 lb 9.2 oz (3890 g) APGAR: 8, 9  Home with mother.  Nahmir Zeidman L 09/19/2014, 12:29 PM

## 2014-09-19 NOTE — Progress Notes (Signed)
Clinical Social Work Department PSYCHOSOCIAL ASSESSMENT - MATERNAL/CHILD 09/19/2014  Patient:  [name] Sanchez,[name] Sanchez  Account Number:  [id number]  Admit Date:  09/17/2014  Childs Name:   Jaxson Zwiefelhofer   Clinical Social Worker:  Abuk Selleck, CLINICAL SOCIAL WORKER   Date/Time:  09/19/2014 10:30 AM  Date Referred:  09/18/2014   Referral source  RN     Referred reason  Psychosocial assessment   Other referral source:    I:  FAMILY / HOME ENVIRONMENT Child's legal guardian:  PARENT  Guardian - Name Guardian - Age Guardian - Address  [name] Sanchez 17 [address] [place], [place] [address]  [name] Sanchez  same as above   Other household support members/support persons Name Relationship DOB  Hunter SON 20 1/2 years old   Other support:   MOB stated that she is well supported by her family and friends.  She shared that all of their families live within a short driving distance of their home.    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   Financial resources:  Private Insurance If Medicaid - County:    School / Grade:  N/A Maternity Care Coordinator / Child Services Coordination / Early Interventions:   N/A  Cultural issues impacting care:   None reported.    III  STRENGTHS Strengths  Adequate Resources  Home prepared for Child (including basic supplies)  Supportive family/friends   Strength comment:    IV  RISK FACTORS AND CURRENT PROBLEMS Current Problem:  YES   Risk Factor & Current Problem Patient Issue Family Issue Risk Factor / Current Problem Comment  Mental Illness Y N MOB presents with a mental health history significnat for depression and anxiety.  She is currently prescribed Zoloft, but stated that she continues to feel anxious and overwhelmed.    V  SOCIAL WORK ASSESSMENT CSW met with the MOB in her room in order to complete the assessment.  Consult was ordered as RN reported concern that her 2 year old son may be staying with her in-laws, indicating potentially not having custody of her child.  CSW was able to clarify that their son is staying with the FOB's parents only while they are in the hospital, and they do have full custody of their son.  MOB and FOB were easily engaged and were receptive to the visit.  The MOB provided consent for the FOB and the MGM to be present.  MOB openly discussed her anxiety, and acknowledges that her anxiety can be problematic at times.  She presents as motivated to continue to treat her symptoms of anxiety as evidenced by her reports to continue medication and her desire to receive referrals for therapy.  MOB presented in a pleasant mood and displayed an appropriate range in affect.   MOB stated that she is excited about the birth of her son, but did express feeling overwhelmed as well as she adjusts to having two sons.  She reported that she feels overwhelmed at home since her two year old son is "busy", and that he has recently been off his "schedule" which increases her stress.  She shared that she is also overwhelmed at times since she is tired after working 12 hour shifts, and then experienced physical discomfort during her pregnancy.  MOB stated that she is concerned about her anxiety when she returns home as she adjusts to having   two children in the home.  She shared that she feels well supported, but upon further exploration, MOB presents with core beliefs that negatively impact her ability to utilize her supports.  She shared that she fears that she [name] be a bad mother if she asks for help since she is independent and likes "to do everything on my own".  MOB is able to identify evidence that supports that she is a good mother. MOB presents with insight related to negative outcomes if she does not ask for help and potential gains if she were to ask for help.  MOB acknowledged need to reach out for help so  that she can rest, which [name] then allow her to be a better mother.  She also reported that she would want to help someone if they were in a similar situation as herself; however, she continued to report that it is difficult for her to ask for help.   MOB confirmed that she is currently prescribed Zoloft.  She stated that was on a different medication prior to her pregnancy, but that it was switched to Zoloft when she learned that she was pregnant.  She endorsed intention to continue on medication, and was receptive to referrals for therapy since she does not like feeling anxious or overwhelmed.    VI SOCIAL WORK PLAN Social Work Plan  Patient/Family Education  No Further Intervention Required / No Barriers to Discharge  Information/Referral to Community Resources   Type of pt/family education:   Postpartum depression   If child protective services report - county:   If child protective services report - date:   Information/referral to community resources comment:   CSW to provide outpatient therapy resources.   Other social work plan:   CSW to provide ongoing emotional support PRN.     

## 2014-09-19 NOTE — Progress Notes (Signed)
Post Partum Day 1 Subjective: no complaints, up ad lib, voiding, tolerating PO and + flatus wants to go home  Objective: Blood pressure 122/68, pulse 60, temperature 97.7 F (36.5 C), temperature source Oral, resp. rate 16, height 5\' 4"  (1.626 m), weight 92.08 kg (203 lb), last menstrual period 11/15/2013, SpO2 100.00%, unknown if currently breastfeeding.  Physical Exam:  General: alert, cooperative and appears stated age Lochia: appropriate Uterine Fundus: firm Incision: healing well, no significant drainage, no dehiscence, no significant erythema DVT Evaluation: No evidence of DVT seen on physical exam.   Recent Labs  09/18/14 0535 09/19/14 0605  HGB 11.3* 10.6*  HCT 33.3* 30.9*    Assessment/Plan: Discharge home   LOS: 2 days   [name] Sanchez 09/19/2014, 12:28 PM

## 2014-10-06 ENCOUNTER — Encounter (HOSPITAL_COMMUNITY): Payer: Self-pay

## 2014-11-17 ENCOUNTER — Ambulatory Visit: Payer: Self-pay | Admitting: Physician Assistant

## 2014-11-17 LAB — CBC WITH DIFFERENTIAL/PLATELET
Basophil #: 0 10*3/uL (ref 0.0–0.1)
Basophil %: 0.4 %
Eosinophil #: 0.1 10*3/uL (ref 0.0–0.7)
Eosinophil %: 1 %
HCT: 38.5 % (ref 35.0–47.0)
HGB: 12.7 g/dL (ref 12.0–16.0)
Lymphocyte #: 1.5 10*3/uL (ref 1.0–3.6)
Lymphocyte %: 18.1 %
MCH: 31.4 pg (ref 26.0–34.0)
MCHC: 33 g/dL (ref 32.0–36.0)
MCV: 95 fL (ref 80–100)
MONOS PCT: 10.7 %
Monocyte #: 0.9 x10 3/mm (ref 0.2–0.9)
NEUTROS ABS: 5.9 10*3/uL (ref 1.4–6.5)
Neutrophil %: 69.8 %
Platelet: 206 10*3/uL (ref 150–440)
RBC: 4.05 10*6/uL (ref 3.80–5.20)
RDW: 12.7 % (ref 11.5–14.5)
WBC: 8.4 10*3/uL (ref 3.6–11.0)

## 2014-11-17 LAB — TSH: Thyroid Stimulating Horm: 1.43 u[IU]/mL

## 2014-11-17 LAB — T4, FREE: FREE THYROXINE: 0.77 ng/dL (ref 0.76–1.46)

## 2014-12-17 ENCOUNTER — Encounter: Payer: Self-pay | Admitting: Internal Medicine

## 2015-01-19 ENCOUNTER — Encounter: Payer: Self-pay | Admitting: Internal Medicine

## 2015-01-22 ENCOUNTER — Ambulatory Visit (INDEPENDENT_AMBULATORY_CARE_PROVIDER_SITE_OTHER): Payer: 59 | Admitting: Internal Medicine

## 2015-01-22 ENCOUNTER — Encounter: Payer: Self-pay | Admitting: Internal Medicine

## 2015-01-22 VITALS — BP 118/70 | HR 74 | Temp 98.1°F | Wt 180.0 lb

## 2015-01-22 DIAGNOSIS — G43809 Other migraine, not intractable, without status migrainosus: Secondary | ICD-10-CM

## 2015-01-22 DIAGNOSIS — F32A Depression, unspecified: Secondary | ICD-10-CM

## 2015-01-22 DIAGNOSIS — F329 Major depressive disorder, single episode, unspecified: Secondary | ICD-10-CM

## 2015-01-22 DIAGNOSIS — F419 Anxiety disorder, unspecified: Secondary | ICD-10-CM

## 2015-01-22 DIAGNOSIS — F418 Other specified anxiety disorders: Secondary | ICD-10-CM

## 2015-01-22 MED ORDER — FLUOXETINE HCL 20 MG PO TABS: 20.0000 mg | ORAL_TABLET | Freq: Every day | ORAL | Status: DC

## 2015-01-22 NOTE — Progress Notes (Signed)
Subjective:    Patient ID: [name] Sanchez, female    DOB: 1996/05/15, 19 y.o.   MRN: [id number]  HPI  Pt presents to the clinic today to follow up migraine. She reports her migraines are occuring once per week, last 2-3 days then resolve. They start on the left side of her head, temple region and radiate to the back of her head. She does have sensitivity to light and sound. She reports nausea but no vomiting. BC and laying in a dark room help. She reports she started Prozac 10/2013 and headaches got better, so she never went to the headache clinic as we had previously discussed. She got pregnant and stopped her Prozac. Her OB/GYN put her on Zoloft at 50 mg.  She developed severe post partum depression and her OB/GYN increased her Zoloft to 100 mg. It seemed to help a little but reports she felt like the Prozac was more effective for anxiety and depression as well as her headaches. She does report feeling very stressed. She has 2 small children 3 years and 4 months. Her husband does not help her with the children or the household chores. She works full time at [employer]. She denies SI/HI.   Review of Systems      Past Medical History  Diagnosis Date  . Hx of ovarian cyst     x 2-3  . Gestational hypertension 01/2012    also had postpartum preelampsia  . Plica of knee 08/2012    medial plica left knee  . Migraine   . Anxiety   . Panic attack   . Hx of varicella     Current Outpatient Prescriptions  Medication Sig Dispense Refill  . acetaminophen (TYLENOL) 500 MG tablet Take 1,000 mg by mouth every 6 (six) hours as needed for headache.    . sertraline (ZOLOFT) 100 MG tablet Take 100 mg by mouth daily.     No current facility-administered medications for this visit.    Allergies  Allergen Reactions  . Cefuroxime Axetil Rash    Family History  Problem Relation Age of Onset  . Hypertension Mother   . Heart disease Mother     MI  . Anesthesia problems Mother     post-op N/V  .  Asthma Sister   . Bipolar disorder Sister   . Asthma Brother   . Diabetes Maternal Aunt   . Heart disease Father   . Bipolar disorder Father     History   Social History  . Marital Status: Married    Spouse Name: N/A  . Number of Children: N/A  . Years of Education: N/A   Occupational History  . Not on file.   Social History Main Topics  . Smoking status: Never Smoker   . Smokeless tobacco: Never Used  . Alcohol Use: No  . Drug Use: No  . Sexual Activity: Yes    Birth Control/ Protection: None     Comment: last night   Other Topics Concern  . Not on file   Social History Narrative     Constitutional: Pt reports headaches. Denies fever, malaise, fatigue, or abrupt weight changes.  HEENT: Denies eye pain, eye redness, ear pain, ringing in the ears, wax buildup, runny nose, nasal congestion, bloody nose, or sore throat. Respiratory: Denies difficulty breathing, shortness of breath, cough or sputum production.   Cardiovascular: Denies chest pain, chest tightness, palpitations or swelling in the hands or feet.  Gastrointestinal: Pt reports nausea. Denies abdominal pain, bloating,  constipation, diarrhea or blood in the stool.  Neurological: Denies dizziness, difficulty with memory, difficulty with speech or problems with balance and coordination.  Psych: Pt reports anxiety, depression and stress. Denies SI/HI.  No other specific complaints in a complete review of systems (except as listed in HPI above).  Objective:   Physical Exam   BP 118/70 mmHg  Pulse 74  Temp(Src) 98.1 F (36.7 C) (Oral)  Wt 180 lb (81.647 kg)  SpO2 98%  LMP 01/13/2015 Wt Readings from Last 3 Encounters:  01/22/15 180 lb (81.647 kg)  09/18/14 203 lb (92.08 kg) (98 %*, Z = 1.99)  09/11/14 202 lb (91.627 kg) (98 %*, Z = 1.97)   * Growth percentiles are based on CDC 2-20 Years data.    General: Appears herstated age, well developed, well nourished in NAD. Skin: Warm, dry and intact. No  rashes, lesions or ulcerations noted. HEENT: Head: normal shape and size; Eyes: sclera white, no icterus, conjunctiva pink, PERRLA and EOMs intact;  Cardiovascular: Normal rate and rhythm. S1,S2 noted.  No murmur, rubs or gallops noted.  Pulmonary/Chest: Normal effort and positive vesicular breath sounds. No respiratory distress. No wheezes, rales or ronchi noted.  Abdomen: Soft and nontender. Normal bowel sounds, no bruits noted. No distention or masses noted. Liver, spleen and kidneys non palpable. Neurological: Alert and oriented.  Psychiatric: Mood tearful and affect normal. Behavior is normal. Judgment and thought content normal.    BMET    Component Value Date/Time   NA 139 09/18/2014 0110   K 3.7 09/18/2014 0110   CL 103 09/18/2014 0110   CO2 21 09/18/2014 0110   GLUCOSE 88 09/18/2014 0110   BUN 7 09/18/2014 0110   CREATININE 0.54 09/18/2014 0110   CALCIUM 9.1 09/18/2014 0110   GFRNONAA >90 09/18/2014 0110   GFRAA >90 09/18/2014 0110    Lipid Panel     Component Value Date/Time   CHOL 174 04/04/2012 1410   TRIG 62.0 04/04/2012 1410   HDL 58.30 04/04/2012 1410   CHOLHDL 3 04/04/2012 1410   VLDL 12.4 04/04/2012 1410   LDLCALC 103* 04/04/2012 1410    CBC    Component Value Date/Time   WBC 11.9* 09/19/2014 0605   RBC 3.23* 09/19/2014 0605   HGB 10.6* 09/19/2014 0605   HCT 30.9* 09/19/2014 0605   PLT 122* 09/19/2014 0605   MCV 95.7 09/19/2014 0605   MCH 32.8 09/19/2014 0605   MCHC 34.3 09/19/2014 0605   RDW 13.6 09/19/2014 0605   LYMPHSABS 2.1 12/03/2013 1135   MONOABS 0.3 12/03/2013 1135   EOSABS 0.1 12/03/2013 1135   BASOSABS 0.1 12/03/2013 1135    Hgb A1C No results found for: HGBA1C      Assessment & Plan:

## 2015-01-22 NOTE — Assessment & Plan Note (Addendum)
Seems to be stress related Will stop Zoloft and start Prozac She declines RX for Fioricet at this time Advised her to take advil prn instead of BC powders  If no improvement, will refer her back to the headache clinic

## 2015-01-22 NOTE — Assessment & Plan Note (Signed)
Support offered today Zoloft switched to Prozac Offered referral to therapy but she declines at this time

## 2015-01-22 NOTE — Patient Instructions (Signed)

## 2015-01-22 NOTE — Progress Notes (Signed)
Pre visit review using our clinic review tool, if applicable. No additional management support is needed unless otherwise documented below in the visit note. 

## 2015-02-03 ENCOUNTER — Ambulatory Visit (INDEPENDENT_AMBULATORY_CARE_PROVIDER_SITE_OTHER): Payer: 59

## 2015-02-03 ENCOUNTER — Telehealth: Payer: Self-pay | Admitting: Internal Medicine

## 2015-02-03 VITALS — Ht 65.0 in | Wt 181.0 lb

## 2015-02-03 DIAGNOSIS — G5750 Tarsal tunnel syndrome, unspecified lower limb: Secondary | ICD-10-CM

## 2015-02-03 DIAGNOSIS — M722 Plantar fascial fibromatosis: Secondary | ICD-10-CM

## 2015-02-03 DIAGNOSIS — R52 Pain, unspecified: Secondary | ICD-10-CM

## 2015-02-03 DIAGNOSIS — Z7689 Persons encountering health services in other specified circumstances: Secondary | ICD-10-CM

## 2015-02-03 DIAGNOSIS — M792 Neuralgia and neuritis, unspecified: Secondary | ICD-10-CM | POA: Diagnosis not present

## 2015-02-03 MED ORDER — MELOXICAM 15 MG PO TABS: 15.0000 mg | ORAL_TABLET | Freq: Every day | ORAL | Status: DC

## 2015-02-03 NOTE — Progress Notes (Signed)
   Subjective:    Patient ID: [name] Sanchez, female    DOB: 21-Oct-1996, 21 y.o.   MRN: [id number]  HPI Comments: Pt complains of painful shooting pain in B/L heel and inferior arch for 2 months.  Pt states she also has burning and shooting pain is worse after rest.  Pt has switched to stiffer nurses shoes, but no relief.     Review of Systems  All other systems reviewed and are negative.      Objective:   Physical Exam 19 year old white female well-developed well-nourished oriented 3 presents at this time with a complaint of painful and arch is bilateral pain on first up in the morning or getting a fall. Of rest with some burning and paresthesia also occurring also indicates her left hands having some burning stinging and possibly even some numbness and weakness. Objective findings as follows vascular status is pedal pulses are palpable DP and PT +2 over 4 Refill time 3 seconds all digits epicritic and proprioceptive sensations intact and symmetric is normal plantar response and DTRs there is a paresthesia on percussion of the tarsal canal medial ankle area bilateral left and right distal proximal radiation of pain symptoms. Pain on direct palpation medial calcaneal tubercle mid arch from forefoot all the mid arch and calcaneal tubercle insertion of the fascia. Reveal well-developed inferior well-developed thickening of plantar fascial no inferior spurs no retrocalcaneal spurs no cysts tumors or fractures are noted mild promontory changes noted of both feet. Patient does have a 2325-month-old child 21-year-old been active on her feet had been wearing some softer shoes and memory from shoes currently wearing a more firm soled clog which is been feeling better for her feet.       Assessment & Plan:  Assessment findings consistent with plantar fasciitis/heel spur syndrome with subsequent complication of likely tarsal tunnel symptomology. May also be getting carpal tunnel in her left hand as well  maybe dressed this with her other doctor in the future. This time patient is placed in fascial strapping of the foot discuss a steroid injection outpatient wish to avoid injections due to her anxiety regarding injections. Patient will be recheck in 2 weeks for follow-up fascial strapping applied maintain for 5 days as instructed also prescription for meloxicam 15 g once daily is issued recommended ice pack to the ankle and heel area daily as instructed table shoe suggested crocs for around the house. May be candidate for orthoses in the future if no improvement steroid injections need to be considered.  Alvan Dameichard Amiee Wiley DPM

## 2015-02-03 NOTE — Telephone Encounter (Signed)
Forms completed, given to [facility] to copy, then [facility]

## 2015-02-03 NOTE — Patient Instructions (Signed)
Plantar Fasciitis Plantar fasciitis is a common condition that causes foot pain. It is soreness (inflammation) of the band of tough fibrous tissue on the bottom of the foot that runs from the heel bone (calcaneus) to the ball of the foot. The cause of this soreness may be from excessive standing, poor fitting shoes, running on hard surfaces, being overweight, having an abnormal walk, or overuse (this is common in runners) of the painful foot or feet. It is also common in aerobic exercise dancers and ballet dancers. SYMPTOMS  Most people with plantar fasciitis complain of:  Severe pain in the morning on the bottom of their foot especially when taking the first steps out of bed. This pain recedes after a few minutes of walking.  Severe pain is experienced also during walking following a long period of inactivity.  Pain is worse when walking barefoot or up stairs DIAGNOSIS   Your caregiver will diagnose this condition by examining and feeling your foot.  Special tests such as X-rays of your foot, are usually not needed. PREVENTION   Consult a sports medicine professional before beginning a new exercise program.  Walking programs offer a good workout. With walking there is a lower chance of overuse injuries common to runners. There is less impact and less jarring of the joints.  Begin all new exercise programs slowly. If problems or pain develop, decrease the amount of time or distance until you are at a comfortable level.  Wear good shoes and replace them regularly.  Stretch your foot and the heel cords at the back of the ankle (Achilles tendon) both before and after exercise.  Run or exercise on even surfaces that are not hard. For example, asphalt is better than pavement.  Do not run barefoot on hard surfaces.  If using a treadmill, vary the incline.  Do not continue to workout if you have foot or joint problems. Seek professional help if they do not improve. HOME CARE INSTRUCTIONS     Avoid activities that cause you pain until you recover.  Use ice or cold packs on the problem or painful areas after working out.  Only take over-the-counter or prescription medicines for pain, discomfort, or fever as directed by your caregiver.  Soft shoe inserts or athletic shoes with air or gel sole cushions may be helpful.  If problems continue or become more severe, consult a sports medicine caregiver or your own health care provider. Cortisone is a potent anti-inflammatory medication that may be injected into the painful area. You can discuss this treatment with your caregiver. MAKE SURE YOU:   Understand these instructions.  Will watch your condition.  Will get help right away if you are not doing well or get worse. Document Released: 08/16/2001 Document Revised: 02/13/2012 Document Reviewed: 10/15/2008 [facility] Patient Information 2015 [place], [place]. This information is not intended to replace advice given to you by your health care provider. Make sure you discuss any questions you have with your health care provider.      Tarsal Tunnel Syndrome with Rehab Tarsal tunnel syndrome is a condition that involves pressure (compression) on the nerve in the ankle (posterior tibial nerve) and results in pain and loss of feeling on the bottom of the foot. The nerve is usually compressed by other structures within the ankle. SYMPTOMS   Signs of nerve damage: pain, numbness, tingling, and loss of feeling along the bottom of the foot.  Pain that worsens with activity.  Feeling a lack of stability in the ankle.  CAUSES  Tarsal tunnel syndrome is caused by structures within the ankle placing pressure on the nerve inside the ankle, which causes sensations in the bottom of the foot. Common sources of pressure include:  Ligament-like tissue (retinaculum) that covers the nerve area in the ankle (tarsal tunnel).  Bony spurs or bumps.  Inflamed tendons (tendonitis). RISK INCREASES  WITH:  Stretched ankle ligaments, which create a loose joint.  Flat feet.  Arthritis of the ankle.  Inflammation of tendons in the foot and ankle.  Previous foot or ankle injury. PREVENTION  Warm up and stretch properly before activity.  Maintain physical fitness:  Strength, flexibility, and endurance.  Cardiovascular fitness (increases heart rate).  Wear properly fitted shoes.  Wear arch supports (orthotics), if you have flat feet.  Protect the ankle with taping, braces, or compression bandages. PROGNOSIS  If treated properly, the symptoms of tarsal tunnel syndrome usually go away with non-surgical treatment. Occasionally, surgery is necessary to free the compressed nerve.  RELATED COMPLICATIONS  Permanent nerve damage, including pain, numbness, tingling, or weakness in the ankle. TREATMENT Treatment first involves resting from any activities that aggravate the symptoms, as well as the use of ice and medicine to reduce pain and inflammation. The use of strengthening and stretching exercises may help reduce pain from activity. Other treatments include wearing arch supports, if you have flat feet, and cross training (training in various physical activities) to reduce stress on the foot and ankle. If symptoms persist, despite non-surgical treatment, then surgery may be recommended. Surgery usually provides full relief from symptoms.  MEDICATION   If pain medicine is necessary, nonsteroidal anti-inflammatory medicines (aspirin and ibuprofen), or other minor pain relievers (acetaminophen), are often recommended.  Do not take pain medication for 7 days before surgery.  Prescription pain relievers may be given if your caregiver thinks they are necessary. Use only as directed and only as much as you need. HEAT AND COLD  Cold treatment (icing) relieves pain and reduces inflammation. Cold treatment should be applied for 10 to 15 minutes every 2 to 3 hours, and immediately after any  activity that aggravates your symptoms. Use ice packs or an ice massage.  Heat treatment may be used prior to performing stretching and strengthening activities prescribed by your caregiver, physical therapist, or athletic trainer. Use a heat pack or a warm water soak. SEEK MEDICAL CARE IF:  Treatment does not seem to help, or the condition worsens.  Any medicines produce negative side effects.  Any complications from surgery occur:  Pain, numbness, or coldness in the affected foot.  Discoloration beneath the toenails (blue or gray) of the affected foot.  Signs of infections (fever, pain, inflammation, redness, or persistent bleeding). EXERCISES RANGE OF MOTION (ROM) AND STRETCHING EXERCISES - Tarsal Tunnel Syndrome (Posterior Tibial Nerve Compression) These exercises may help you when beginning to restore activity to your injured foot. Complete these exercises with caution. Nerves are very sensitive tissue. They must be exercised gently. Never force a motion and do not push through discomfort. Notify your caregiver of any exercises which increase your pain or worsen your symptoms. Your symptoms may go away with or without further involvement from your physician, physical therapist or athletic trainer. While completing these exercises, remember:   Restoring tissue flexibility helps normal motion to return to the joints. This allows healthier, less painful movement and activity.  An effective stretch should be held for at least 30 seconds.  A stretch should never be painful. You should only feel a  gentle lengthening or release in the stretched tissue. STRETCH - Gastroc, Standing  Place hands on wall.  Extend right / left leg behind you and place a folded washcloth under the arch of your foot for support. Keep the front knee somewhat bent.  Slightly point your toes inward on your back foot.  Keeping your right / left heel on the floor and your knee straight, shift your weight toward  the wall, not allowing your back to arch.  You should feel a gentle stretch in the right / left calf. Hold this position for __________ seconds. Repeat __________ times. Complete this stretch __________ times per day. STRETCH - Soleus, Standing  Place hands on wall.  Extend right / left leg behind you and place a folded washcloth under the arch of your foot for support. Keep the front knee somewhat bent.  Slightly point your toes inward on your back foot.  Keep your right / left heel on the floor, bend your back knee, and slightly shift your weight over the back leg so that you feel a gentle stretch deep in your back calf.  Hold this position for __________ seconds. Repeat __________ times. Complete this stretch __________ times per day. RANGE OF MOTION - Toe Extension, Flexion  Sit with your right / left leg crossed over your opposite knee.  Grasp your toes and gently pull them back toward the top of your foot. You should feel a stretch on the bottom of your toes and foot.  Hold this stretch for __________ seconds.  Now, gently pull your toes toward the bottom of your foot. You should feel a stretch on the top of your toes and foot.  Hold this stretch for __________ seconds. Repeat __________ times. Complete this stretch __________ times per day.  RANGE OF MOTION - Ankle Eversion  Sit with your right / left ankle crossed over your opposite knee.  Grip your foot with your opposite hand, placing your thumb on the top of your foot and your fingers across the bottom of your foot.  Gently push your foot downward with a slight rotation so your littlest toes rise slightly toward the ceiling.  You should feel a gentle stretch on the inside of your ankle. Hold the stretch for __________ seconds. Repeat __________ times. Complete this exercise __________ times per day.  RANGE OF MOTION - Ankle Dorsiflexion, Active Assisted  Remove shoes and sit on a chair, preferably not on a carpeted  surface.  Place right / left foot on the floor, directly under knee. Extend your opposite leg for support.  Keeping your heel down, slide your right / left foot back toward the chair until you feel a stretch at your ankle or calf. If you do not feel a stretch, slide your bottom forward to the edge of the chair while still keeping your heel down.  Hold this stretch for __________ seconds. Repeat __________ times. Complete this stretch __________ times per day.  STRETCH - Hamstrings, Supine  Lie on your back. Loop a belt or towel over the ball of your right / left foot.  Straighten your right / left knee and slowly pull on the belt to raise your leg. Do not allow the right / left knee to bend. Keep your opposite leg flat on the floor.  Raise the leg until you feel a gentle stretch behind your right / left knee or thigh. Hold this position for __________ seconds. Repeat __________ times. Complete this stretch __________ times per day.  STRETCH - Hamstrings, Doorway  Lie on your back with your right / left leg extended and resting on the wall and the opposite leg flat on the ground through the door. Initially, position your bottom farther away from the wall than the illustration shows.  Keep your right / left knee straight. If you feel a stretch behind your knee or thigh, hold this position for __________ seconds.  If you do not feel a stretch, scoot your bottom closer to the door and hold __________ seconds. Repeat __________ times. Complete this stretch __________ times per day.  STRETCH - Hamstrings, Standing  Stand or sit, and extend your right / left leg, placing your foot on a chair or foot stool  Keep a slight arch in your low back, and keep your hips straight forward.  Lead with your chest and lean forward at the waist, until you feel a gentle stretch in the back of your right / left knee or thigh. (When done correctly, this exercise requires leaning only a small distance.)  Hold  this position for __________ seconds. Repeat __________ times. Complete this stretch __________ times per day. STRENGTHENING EXERCISES - Tarsal Tunnel Syndrome (Posterior Tibial Nerve Compression) These exercises may help you when beginning to restore activity to your injured foot. Your symptoms may go away with or without further involvement from your physician, physical therapist or athletic trainer. While completing these exercises, remember:   Muscles can gain both the endurance and the strength needed for everyday activities through controlled exercises.  Complete these exercises as instructed by your physician, physical therapist or athletic trainer. Increase the resistance and repetitions only as guided.  You may experience muscle soreness or fatigue, but the pain or discomfort you are trying to eliminate should never worsen during these exercises. If this pain does worsen, stop and make certain you are following the directions exactly. If the pain is still present after adjustments, discontinue the exercise until you can discuss the trouble with your caregiver. STRENGTH - Dorsiflexors  Secure a rubber exercise band or tubing to a fixed object (table, pole) and loop the other end around your right / left foot.  Sit on the floor facing the fixed object. The band should be slightly tense when your foot is relaxed.  Slowly draw your foot back toward you, using your ankle and toes.  Hold this position for __________ seconds. Slowly release the tension in the band and return your foot to the starting position. Repeat __________ times. Complete this exercise __________ times per day.  STRENGTH - Plantar-flexors  Sit with your right / left leg extended. Holding onto both ends of a rubber exercise band or tubing, loop it around the ball of your foot. Keep a slight tension in the band.  Slowly push your toes away from you, pointing them downward.  Hold this position for __________ seconds.  Return to the starting position slowly, controlling the tension in the band. Repeat __________ times. Complete this exercise __________ times per day.  STRENGTH - Plantar-flexors, Standing  Stand with your feet shoulder width apart. Place your hands on a wall or table to steady yourself, using as little support as needed.  Keeping your weight evenly spread over the width of your feet, rise up on your toes.*  Hold this position for __________ seconds. Repeat __________ times. Complete this exercise __________ times per day.  *If this is too easy, shift your weight toward your right / left leg until you feel challenged. Ultimately, you may  be asked to do this exercise while standing on your right / left foot only. STRENGTH - Towel Curls  Sit in a chair, on a non-carpeted surface.  Place your foot on a towel, keeping your heel on the floor.  Pull the towel toward your heel only by curling your toes. Keep your heel on the floor.  If instructed by your physician, physical therapist or athletic trainer, add ____________________ at the end of the towel. Repeat __________ times. Complete this exercise __________ times per day. STRENGTH - Ankle Eversion  Secure one end of a rubber exercise band or tubing to a fixed object (table, pole). Loop the other end around your foot, just before your toes.  Place your fists between your knees. This will focus your strengthening at your ankle.  Drawing the band across your opposite foot, away from the pole, slowly pull your little toe out and up. Make sure the band is positioned to resist the entire motion.  Hold this position for __________ seconds.  Return to the starting position slowly, controlling the tension in the band. Repeat __________ times. Complete this exercise __________ times per day.  STRENGTH - Ankle Inversion  Secure one end of a rubber exercise band or tubing to a fixed object (table, pole). Loop the other end around your foot, just  before your toes.  Place your fists between your knees. This will focus your strengthening at your ankle.  Slowly, pull your big toe up and in, making sure the band is positioned to resist the entire motion.  Hold this position for __________ seconds.  Return to the starting position slowly, controlling the tension in the band. Repeat __________ times. Complete this exercises __________ times per day.  Document Released: 11/21/2005 Document Revised: 02/13/2012 Document Reviewed: 03/05/2009 [facility] Patient Information 2015 [place], [place]. This information is not intended to replace advice given to you by your health care provider. Make sure you discuss any questions you have with your health care provider.      ICE INSTRUCTIONS  Apply ice or cold pack to the affected area at least 3 times a day for 10-15 minutes each time.  You should also use ice after prolonged activity or vigorous exercise.  Do not apply ice longer than 20 minutes at one time.  Always keep a cloth between your skin and the ice pack to prevent burns.  Being consistent and following these instructions will help control your symptoms.  We suggest you purchase a gel ice pack because they are reusable and do bit leak.  Some of them are designed to wrap around the area.  Use the method that works best for you.  Here are some other suggestions for icing.   Use a frozen bag of peas or corn-inexpensive and molds well to your body, usually stays frozen for 10 to 20 minutes.  Wet a towel with cold water and squeeze out the excess until it's damp.  Place in a bag in the freezer for 20 minutes. Then remove and use.

## 2015-02-03 NOTE — Telephone Encounter (Signed)
FMLA forms in your IN box for completion. 

## 2015-02-04 NOTE — Telephone Encounter (Signed)
Forms copied for myself and Revonda Standardllison as well as copy to be scanned and for charge--forms have been faxed-- Left message on voicemail for pt to return call--FMLA forms placed in front office for pt pick up

## 2015-02-13 ENCOUNTER — Telehealth: Payer: Self-pay | Admitting: Internal Medicine

## 2015-02-13 NOTE — Telephone Encounter (Signed)
Left message on voicemail.

## 2015-02-13 NOTE — Telephone Encounter (Signed)
Tylenol, UC if worse

## 2015-02-13 NOTE — Telephone Encounter (Signed)
Patient Name: [name] Sanchez  DOB: 09-06-1996    Initial Comment Caller states, has a migraine, the pain is on the left side, top of her neck, near lymph nodes, these are swollen. She is Taking Mobic, wants to know what else she can take    Nurse Assessment  Nurse: Sherilyn CooterHenry, RN, Thurmond ButtsWade Date/Time Lamount Cohen(Eastern Time): 02/13/2015 2:08:50 PM  Confirm and document reason for call. If symptomatic, describe symptoms. ---Caller states that she has had a migraine for about 2 days now. She rates her pain as 10 on 0-10 scale. This is not the worst headache of her life. The pain was a 10 when it started.  Has the patient traveled out of the country within the last 30 days? ---No  Does the patient require triage? ---Yes  Related visit to physician within the last 2 weeks? ---No  Does the PT have any chronic conditions? (i.e. diabetes, asthma, etc.) ---Yes  List chronic conditions. ---Migraines  Did the patient indicate they were pregnant? ---No     Guidelines    Guideline Title Affirmed Question Affirmed Notes  Headache [1] SEVERE headache AND [2] sudden-onset (i.e., reaching maximum intensity within seconds)    Final Disposition User   Go to ED Now (or PCP triage) Sherilyn CooterHenry, RN, Thurmond ButtsWade    Comments  Caller states that she can't go to ER now as she is at work. She could run by the doctor's office if needed. No appointments available at the office. She states she may decide to go to ER, but not sure at present.

## 2015-02-17 ENCOUNTER — Ambulatory Visit: Payer: 59

## 2015-04-20 ENCOUNTER — Encounter: Payer: Self-pay | Admitting: Internal Medicine

## 2015-04-20 ENCOUNTER — Ambulatory Visit (INDEPENDENT_AMBULATORY_CARE_PROVIDER_SITE_OTHER): Payer: 59 | Admitting: Internal Medicine

## 2015-04-20 VITALS — BP 122/76 | HR 88 | Temp 98.4°F | Wt 174.0 lb

## 2015-04-20 DIAGNOSIS — F329 Major depressive disorder, single episode, unspecified: Secondary | ICD-10-CM

## 2015-04-20 DIAGNOSIS — F419 Anxiety disorder, unspecified: Principal | ICD-10-CM

## 2015-04-20 DIAGNOSIS — F418 Other specified anxiety disorders: Secondary | ICD-10-CM | POA: Diagnosis not present

## 2015-04-20 DIAGNOSIS — F32A Depression, unspecified: Secondary | ICD-10-CM

## 2015-04-20 MED ORDER — FLUOXETINE HCL 40 MG PO CAPS: 40.0000 mg | ORAL_CAPSULE | Freq: Every day | ORAL | Status: DC

## 2015-04-20 MED ORDER — ALPRAZOLAM 0.25 MG PO TABS: 0.2500 mg | ORAL_TABLET | Freq: Two times a day (BID) | ORAL | Status: DC | PRN

## 2015-04-20 NOTE — Patient Instructions (Signed)

## 2015-04-20 NOTE — Assessment & Plan Note (Signed)
Worse She does not have a stable support system, no friends to talk to Support offered today Will increase her Prozac to 40 mg daily RX for Xanax 0.25 mg prn for severe anxiety, discussed not taking this on a daily basis Referral placed for therapy  She will contact me in 4 weeks via mychart to let me know how she is doing  RTC as needed or if symptoms persist or worsen

## 2015-04-20 NOTE — Progress Notes (Signed)
Pre visit review using our clinic review tool, if applicable. No additional management support is needed unless otherwise documented below in the visit note. 

## 2015-04-20 NOTE — Progress Notes (Signed)
Subjective:    Patient ID: [name] Sanchez, female    DOB: September 22, 1996, 19 y.o.   MRN: [id number]  HPI  Pt presents to the clinic today to follow up anxiety and depression. She reports that her anxiety and depression have gotten worse. She is feeling more anxious on a daily basis. She has periods where she feels like her heart is racing, she feels short of breath at times and is having trouble sleeping. She feels more stress at home and at work. She cares for her 2 small children without much help from her husband. She also feels like he is not very supportive or understanding about what is going with her anxiety and depression. She also reports that her mother was recently diagnosed with cardiomyopathy. She wants to know what she has to do to get tested. She is taking Prozac daily but really feels like it is not helping as much as it used to. Her and her husband are planning to go to marital counseling. She has seen a therapist alone on one occasion but did not feel like she got much out of it. She would like a referral today to another therapist. She denies SI/HI.   Review of Systems      Past Medical History  Diagnosis Date  . Hx of ovarian cyst     x 2-3  . Gestational hypertension 01/2012    also had postpartum preelampsia  . Plica of knee 08/2012    medial plica left knee  . Migraine   . Anxiety   . Panic attack   . Hx of varicella     Current Outpatient Prescriptions  Medication Sig Dispense Refill  . acetaminophen (TYLENOL) 500 MG tablet Take 1,000 mg by mouth every 6 (six) hours as needed for headache.    Marland Kitchen. FLUoxetine (PROZAC) 20 MG tablet Take 1 tablet (20 mg total) by mouth daily. 30 tablet 3  . meloxicam (MOBIC) 15 MG tablet Take 1 tablet (15 mg total) by mouth daily. 30 tablet 1   No current facility-administered medications for this visit.    Allergies  Allergen Reactions  . Cefuroxime Axetil Rash    Family History  Problem Relation Age of Onset  .  Hypertension Mother   . Heart disease Mother     MI  . Anesthesia problems Mother     post-op N/V  . Asthma Sister   . Bipolar disorder Sister   . Asthma Brother   . Diabetes Maternal Aunt   . Heart disease Father   . Bipolar disorder Father     History   Social History  . Marital Status: Married    Spouse Name: N/A  . Number of Children: N/A  . Years of Education: N/A   Occupational History  . Not on file.   Social History Main Topics  . Smoking status: Never Smoker   . Smokeless tobacco: Never Used  . Alcohol Use: No  . Drug Use: No  . Sexual Activity: Yes    Birth Control/ Protection: None     Comment: last night   Other Topics Concern  . Not on file   Social History Narrative     Constitutional: Denies fever, malaise, fatigue, headache or abrupt weight changes.  Respiratory: Pt reports shortness of breath. Denies difficulty breathing, cough or sputum production.   Cardiovascular: Pt reports palpitations. Denies chest pain, chest tightness, or swelling in the hands or feet.  Neurological: Denies dizziness, difficulty with memory, difficulty with  speech or problems with balance and coordination.  Psych: Pt reports anxiety and depression. Denies SI/I.  No other specific complaints in a complete review of systems (except as listed in HPI above).  Objective:   Physical Exam   BP 122/76 mmHg  Pulse 88  Temp(Src) 98.4 F (36.9 C) (Oral)  Wt 174 lb (78.926 kg)  SpO2 99%  LMP 04/16/2015 Wt Readings from Last 3 Encounters:  04/20/15 174 lb (78.926 kg)  02/03/15 181 lb (82.101 kg)  01/22/15 180 lb (81.647 kg)    General: Appears her stated age, well developed, well nourished in NAD. Skin: Warm, dry and intact. No rashes, lesions or ulcerations noted. Cardiovascular: Normal rate and rhythm. S1,S2 noted.  No murmur, rubs or gallops noted.  Pulmonary/Chest: Normal effort and positive vesicular breath sounds. No respiratory distress. No wheezes, rales or  ronchi noted.  Neurological: Alert and oriented.  Psychiatric: Mood anxious and tearful today.  BMET    Component Value Date/Time   NA 139 09/18/2014 0110   K 3.7 09/18/2014 0110   CL 103 09/18/2014 0110   CO2 21 09/18/2014 0110   GLUCOSE 88 09/18/2014 0110   BUN 7 09/18/2014 0110   CREATININE 0.54 09/18/2014 0110   CALCIUM 9.1 09/18/2014 0110   GFRNONAA >90 09/18/2014 0110   GFRAA >90 09/18/2014 0110    Lipid Panel     Component Value Date/Time   CHOL 174 04/04/2012 1410   TRIG 62.0 04/04/2012 1410   HDL 58.30 04/04/2012 1410   CHOLHDL 3 04/04/2012 1410   VLDL 12.4 04/04/2012 1410   LDLCALC 103* 04/04/2012 1410    CBC    Component Value Date/Time   WBC 8.4 11/17/2014 1441   WBC 11.9* 09/19/2014 0605   RBC 4.05 11/17/2014 1441   RBC 3.23* 09/19/2014 0605   HGB 12.7 11/17/2014 1441   HGB 10.6* 09/19/2014 0605   HCT 38.5 11/17/2014 1441   HCT 30.9* 09/19/2014 0605   PLT 206 11/17/2014 1441   PLT 122* 09/19/2014 0605   MCV 95 11/17/2014 1441   MCV 95.7 09/19/2014 0605   MCH 31.4 11/17/2014 1441   MCH 32.8 09/19/2014 0605   MCHC 33.0 11/17/2014 1441   MCHC 34.3 09/19/2014 0605   RDW 12.7 11/17/2014 1441   RDW 13.6 09/19/2014 0605   LYMPHSABS 1.5 11/17/2014 1441   LYMPHSABS 2.1 12/03/2013 1135   MONOABS 0.9 11/17/2014 1441   MONOABS 0.3 12/03/2013 1135   EOSABS 0.1 11/17/2014 1441   EOSABS 0.1 12/03/2013 1135   BASOSABS 0.0 11/17/2014 1441   BASOSABS 0.1 12/03/2013 1135    Hgb A1C No results found for: HGBA1C      Assessment & Plan:

## 2015-06-22 ENCOUNTER — Ambulatory Visit (INDEPENDENT_AMBULATORY_CARE_PROVIDER_SITE_OTHER): Payer: Self-pay | Admitting: Internal Medicine

## 2015-06-22 ENCOUNTER — Encounter: Payer: Self-pay | Admitting: Internal Medicine

## 2015-06-22 VITALS — BP 128/82 | HR 83 | Temp 98.8°F | Wt 168.0 lb

## 2015-06-22 DIAGNOSIS — T148XXA Other injury of unspecified body region, initial encounter: Secondary | ICD-10-CM

## 2015-06-22 DIAGNOSIS — R1032 Left lower quadrant pain: Secondary | ICD-10-CM

## 2015-06-22 DIAGNOSIS — R1012 Left upper quadrant pain: Secondary | ICD-10-CM

## 2015-06-22 DIAGNOSIS — T148 Other injury of unspecified body region: Secondary | ICD-10-CM

## 2015-06-22 MED ORDER — OMEPRAZOLE 40 MG PO CPDR: 40.0000 mg | DELAYED_RELEASE_CAPSULE | Freq: Every day | ORAL | Status: DC

## 2015-06-22 NOTE — Progress Notes (Signed)
Pre visit review using our clinic review tool, if applicable. No additional management support is needed unless otherwise documented below in the visit note. 

## 2015-06-22 NOTE — Progress Notes (Signed)
Subjective:    Patient ID: [name] Sanchez, female    DOB: 1995/12/22, 19 y.o.   MRN: [id number]  HPI  Pt presents to the clinic today with c/o abdominal pain. This started. The pain is in her LUQ and radiated to her LLQ. It gets worse when she eats. The pain is worse when she lays on her left side. She id go to OB/GYN for the same lase week. Pregnancy test is negative. They did a pelvic/transvaginal ultrasound which showed a small cyst on the right ovary but otherwise normal. She has been a little constipated and has noticed some mucous in her stool. She denies blood in her stool. She has had some intermittent nausea. She has tried Tramadol without any relief. She was told she may need to see a gastroenterologist and wants to know if she will need a referral.  She also reports abnormal bruising. She has bruises all over her arms and legs. She is unable to explain how the bruises got there. She has not noticed any s/s of bleeding. She denies any domestic abuse. She has never bruised this easy before.  Review of Systems      Past Medical History  Diagnosis Date  . Hx of ovarian cyst     x 2-3  . Gestational hypertension 01/2012    also had postpartum preelampsia  . Plica of knee 08/2012    medial plica left knee  . Migraine   . Anxiety   . Panic attack   . Hx of varicella     Current Outpatient Prescriptions  Medication Sig Dispense Refill  . ALPRAZolam (XANAX) 0.25 MG tablet Take 1 tablet (0.25 mg total) by mouth 2 (two) times daily as needed for anxiety. 20 tablet 0  . FLUoxetine (PROZAC) 40 MG capsule Take 1 capsule (40 mg total) by mouth daily. 30 capsule 5  . topiramate (TOPAMAX) 25 MG tablet Take 100 mg by mouth daily.      No current facility-administered medications for this visit.    Allergies  Allergen Reactions  . Cefuroxime Axetil Rash    Family History  Problem Relation Age of Onset  . Hypertension Mother   . Heart disease Mother     MI  . Anesthesia  problems Mother     post-op N/V  . Asthma Sister   . Bipolar disorder Sister   . Asthma Brother   . Diabetes Maternal Aunt   . Heart disease Father   . Bipolar disorder Father     History   Social History  . Marital Status: Married    Spouse Name: N/A  . Number of Children: N/A  . Years of Education: N/A   Occupational History  . Not on file.   Social History Main Topics  . Smoking status: Never Smoker   . Smokeless tobacco: Never Used  . Alcohol Use: No  . Drug Use: No  . Sexual Activity: Yes    Birth Control/ Protection: None     Comment: last night   Other Topics Concern  . Not on file   Social History Narrative     Constitutional: Denies fever, malaise, fatigue, headache or abrupt weight changes.  Respiratory: Denies difficulty breathing, shortness of breath, cough or sputum production.   Cardiovascular: Denies chest pain, chest tightness, palpitations or swelling in the hands or feet.  Gastrointestinal: Pt reports abdominal pain, diarrhea and constipation. Denies blood in the stool.  GU: Denies urgency, frequency, pain with urination, burning sensation, blood  in urine, odor or discharge. Skin: Pt reports easy bruising. Denies redness, rashes, lesions or ulcercations.    No other specific complaints in a complete review of systems (except as listed in HPI above).  Objective:   Physical Exam BP 128/82 mmHg  Pulse 83  Temp(Src) 98.8 F (37.1 C) (Oral)  Wt 168 lb (76.204 kg)  SpO2 99%  LMP 05/15/2015 Wt Readings from Last 3 Encounters:  06/22/15 168 lb (76.204 kg)  04/20/15 174 lb (78.926 kg)  02/03/15 181 lb (82.101 kg)    General: Appears her stated age, well developed, well nourished in NAD. Skin: Warm, dry and intact. Scattered bruising noted on arms and legs. Cardiovascular: Normal rate and rhythm. S1,S2 noted.  No murmur, rubs or gallops noted.  Pulmonary/Chest: Normal effort and positive vesicular breath sounds. No respiratory distress. No  wheezes, rales or ronchi noted.  Abdomen: Soft and tender in the epigastric, LUQ and LLQ. Normal bowel sounds, no bruits noted. No distention or masses noted. Liver, spleen and kidneys non palpable. Neurological: Alert and oriented.    BMET    Component Value Date/Time   NA 139 09/18/2014 0110   K 3.7 09/18/2014 0110   CL 103 09/18/2014 0110   CO2 21 09/18/2014 0110   GLUCOSE 88 09/18/2014 0110   BUN 7 09/18/2014 0110   CREATININE 0.54 09/18/2014 0110   CALCIUM 9.1 09/18/2014 0110   GFRNONAA >90 09/18/2014 0110   GFRAA >90 09/18/2014 0110    Lipid Panel     Component Value Date/Time   CHOL 174 04/04/2012 1410   TRIG 62.0 04/04/2012 1410   HDL 58.30 04/04/2012 1410   CHOLHDL 3 04/04/2012 1410   VLDL 12.4 04/04/2012 1410   LDLCALC 103* 04/04/2012 1410    CBC    Component Value Date/Time   WBC 8.4 11/17/2014 1441   WBC 11.9* 09/19/2014 0605   RBC 4.05 11/17/2014 1441   RBC 3.23* 09/19/2014 0605   HGB 12.7 11/17/2014 1441   HGB 10.6* 09/19/2014 0605   HCT 38.5 11/17/2014 1441   HCT 30.9* 09/19/2014 0605   PLT 206 11/17/2014 1441   PLT 122* 09/19/2014 0605   MCV 95 11/17/2014 1441   MCV 95.7 09/19/2014 0605   MCH 31.4 11/17/2014 1441   MCH 32.8 09/19/2014 0605   MCHC 33.0 11/17/2014 1441   MCHC 34.3 09/19/2014 0605   RDW 12.7 11/17/2014 1441   RDW 13.6 09/19/2014 0605   LYMPHSABS 1.5 11/17/2014 1441   LYMPHSABS 2.1 12/03/2013 1135   MONOABS 0.9 11/17/2014 1441   MONOABS 0.3 12/03/2013 1135   EOSABS 0.1 11/17/2014 1441   EOSABS 0.1 12/03/2013 1135   BASOSABS 0.0 11/17/2014 1441   BASOSABS 0.1 12/03/2013 1135    Hgb A1C No results found for: HGBA1C        Assessment & Plan:   Bruising:  Will check CBC with diff and CMET today  LLQ, LUQ abdominal pain:  ? Ulcer versus gastritis CBC, CMET, H PYlori, Lipase and Amylase today eRx for Prilosec 40 mg daily  If labs normal/no improvement with Prilosec, will refer to GI for further evaluation

## 2015-06-22 NOTE — Patient Instructions (Signed)

## 2015-06-23 LAB — COMPREHENSIVE METABOLIC PANEL
ALT: 15 U/L (ref 0–35)
AST: 16 U/L (ref 0–37)
Albumin: 4.1 g/dL (ref 3.5–5.2)
Alkaline Phosphatase: 50 U/L (ref 39–117)
BILIRUBIN TOTAL: 0.3 mg/dL (ref 0.2–1.2)
BUN: 15 mg/dL (ref 6–23)
CALCIUM: 9.1 mg/dL (ref 8.4–10.5)
CO2: 25 mEq/L (ref 19–32)
CREATININE: 0.7 mg/dL (ref 0.40–1.20)
Chloride: 108 mEq/L (ref 96–112)
GFR: 112.7 mL/min (ref >60.00)
GLUCOSE: 91 mg/dL (ref 70–99)
Potassium: 3.9 mEq/L (ref 3.5–5.1)
Sodium: 139 mEq/L (ref 135–145)
Total Protein: 6.6 g/dL (ref 6.0–8.3)

## 2015-06-23 LAB — CBC WITH DIFFERENTIAL/PLATELET
BASOS ABS: 0.1 10*3/uL (ref 0.0–0.1)
Basophils Relative: 1.5 % (ref 0.0–3.0)
EOS ABS: 0.1 10*3/uL (ref 0.0–0.7)
Eosinophils Relative: 1.4 % (ref 0.0–5.0)
HCT: 37.1 % (ref 36.0–46.0)
HEMOGLOBIN: 12.6 g/dL (ref 12.0–15.0)
Lymphocytes Relative: 29.1 % (ref 12.0–46.0)
Lymphs Abs: 2 10*3/uL (ref 0.7–4.0)
MCHC: 34 g/dL (ref 30.0–36.0)
MCV: 92.6 fl (ref 78.0–100.0)
MONOS PCT: 7.2 % (ref 3.0–12.0)
Monocytes Absolute: 0.5 10*3/uL (ref 0.1–1.0)
Neutro Abs: 4.2 10*3/uL (ref 1.4–7.7)
Neutrophils Relative %: 60.8 % (ref 43.0–77.0)
Platelets: 215 10*3/uL (ref 150.0–400.0)
RBC: 4.01 Mil/uL (ref 3.87–5.11)
RDW: 13.4 % (ref 11.5–14.6)
WBC: 6.9 10*3/uL (ref 4.5–10.5)

## 2015-06-23 LAB — LIPASE: Lipase: 16 U/L (ref 11.0–59.0)

## 2015-06-23 LAB — H. PYLORI ANTIBODY, IGG: H Pylori IgG: NEGATIVE

## 2015-06-23 LAB — AMYLASE: Amylase: 42 U/L (ref 27–131)

## 2015-12-01 ENCOUNTER — Emergency Department (HOSPITAL_COMMUNITY)
Admission: EM | Admit: 2015-12-01 | Discharge: 2015-12-01 | Disposition: A | Discharge status: Home / Self Care | Payer: Self-pay

## 2015-12-08 DIAGNOSIS — L049 Acute lymphadenitis, unspecified: Secondary | ICD-10-CM | POA: Diagnosis not present

## 2015-12-09 ENCOUNTER — Ambulatory Visit: Payer: Self-pay | Admitting: Internal Medicine

## 2015-12-18 DIAGNOSIS — G43111 Migraine with aura, intractable, with status migrainosus: Secondary | ICD-10-CM | POA: Diagnosis not present

## 2015-12-18 DIAGNOSIS — Z6830 Body mass index (BMI) 30.0-30.9, adult: Secondary | ICD-10-CM | POA: Diagnosis not present

## 2015-12-18 DIAGNOSIS — R5383 Other fatigue: Secondary | ICD-10-CM | POA: Diagnosis not present

## 2015-12-18 DIAGNOSIS — E782 Mixed hyperlipidemia: Secondary | ICD-10-CM | POA: Diagnosis not present

## 2015-12-21 ENCOUNTER — Ambulatory Visit (INDEPENDENT_AMBULATORY_CARE_PROVIDER_SITE_OTHER): Payer: 59 | Admitting: Internal Medicine

## 2015-12-21 ENCOUNTER — Encounter: Payer: Self-pay | Admitting: Internal Medicine

## 2015-12-21 VITALS — BP 124/82 | HR 106 | Temp 98.0°F

## 2015-12-21 DIAGNOSIS — Z8659 Personal history of other mental and behavioral disorders: Secondary | ICD-10-CM | POA: Diagnosis not present

## 2015-12-21 DIAGNOSIS — F419 Anxiety disorder, unspecified: Principal | ICD-10-CM

## 2015-12-21 DIAGNOSIS — F418 Other specified anxiety disorders: Secondary | ICD-10-CM

## 2015-12-21 DIAGNOSIS — F329 Major depressive disorder, single episode, unspecified: Secondary | ICD-10-CM

## 2015-12-21 MED ORDER — BACLOFEN 10 MG PO TABS: 10.0000 mg | ORAL_TABLET | Freq: Three times a day (TID) | ORAL | Status: DC

## 2015-12-21 MED ORDER — PAROXETINE HCL 20 MG PO TABS: 20.0000 mg | ORAL_TABLET | Freq: Every day | ORAL | Status: DC

## 2015-12-21 MED ORDER — QUETIAPINE FUMARATE 25 MG PO TABS: 12.5000 mg | ORAL_TABLET | Freq: Every day | ORAL | Status: DC

## 2015-12-21 MED ORDER — HYDROCODONE-ACETAMINOPHEN 5-325 MG PO TABS: 1.0000 | ORAL_TABLET | Freq: Three times a day (TID) | ORAL | Status: DC | PRN

## 2015-12-21 MED ORDER — PHENTERMINE HCL 37.5 MG PO CAPS: 37.5000 mg | ORAL_CAPSULE | ORAL | Status: DC

## 2015-12-21 NOTE — Progress Notes (Signed)
Subjective:    Patient ID: [name] Sanchez, female    DOB: 07/24/1996, 20 y.o.   MRN: [id number]  HPI  Pt presents to the clinic today to follow up anxiety and depression. This has been an ongoing issue but worse lately. She is still taking care of her kids and doing all the housework with no support from her husband. She feels not wanted and useless. It is so bad, that over the last week, she has had thoughts about killing herself. She did come up with a plan (overdose) but did not act on it. She was scared, so she called her grandmother-in-law- who is a therapist for advise. Then she let her in laws know what is going on. For the past week, she has been staying with her in-laws. The kids are staying with her husband. Her father-in-law, who is with her today, says that no one has left her alone for her safety. They are in control of all of her medications. She has seen a therapist x 1, and has an appt to see him tomorrow a 3 pm. She is down, depressed, anxious. She is having trouble sleeping at night, only getting about 3 hours of sleep. She is paranoid. She denies current SI/HI. She denies episodes of mania. She is fine when she has people around her, but symptoms listed above occur when she is alone. She was on Prozac but ran out about 3 days ago. She did not feel like it was helping anyway. Her dose was increased 7 months ago, but she did not feel like that helped. She has failed Zoloft in the past.  Review of Systems      Past Medical History  Diagnosis Date  . Hx of ovarian cyst     x 2-3  . Gestational hypertension 01/2012    also had postpartum preelampsia  . Plica of knee 08/2012    medial plica left knee  . Migraine   . Anxiety   . Panic attack   . Hx of varicella     Current Outpatient Prescriptions  Medication Sig Dispense Refill  . topiramate (TOPAMAX) 25 MG tablet Take 100 mg by mouth daily.     . baclofen (LIORESAL) 10 MG tablet Take 1 tablet (10 mg total) by mouth 3  (three) times daily. 30 each 0  . HYDROcodone-acetaminophen (NORCO/VICODIN) 5-325 MG tablet Take 1 tablet by mouth every 8 (eight) hours as needed for moderate pain. 20 tablet 0  . ibuprofen (ADVIL,MOTRIN) 800 MG tablet take 1 tablet by mouth three times a day if needed  0  . PARoxetine (PAXIL) 20 MG tablet Take 1 tablet (20 mg total) by mouth daily. 30 tablet 2  . phentermine 37.5 MG capsule Take 1 capsule (37.5 mg total) by mouth every morning. 30 capsule 0  . QUEtiapine (SEROQUEL) 25 MG tablet Take 0.5 tablets (12.5 mg total) by mouth at bedtime. 15 tablet 0   No current facility-administered medications for this visit.    Allergies  Allergen Reactions  . Cefuroxime Axetil Rash    Family History  Problem Relation Age of Onset  . Hypertension Mother   . Heart disease Mother     MI  . Anesthesia problems Mother     post-op N/V  . Asthma Sister   . Bipolar disorder Sister   . Asthma Brother   . Diabetes Maternal Aunt   . Heart disease Father   . Bipolar disorder Father     Social History  Social History  . Marital Status: Married    Spouse Name: N/A  . Number of Children: N/A  . Years of Education: N/A   Occupational History  . Not on file.   Social History Main Topics  . Smoking status: Never Smoker   . Smokeless tobacco: Never Used  . Alcohol Use: No  . Drug Use: No  . Sexual Activity: Yes    Birth Control/ Protection: None     Comment: last night   Other Topics Concern  . Not on file   Social History Narrative     Constitutional: Denies fever, malaise, fatigue, headache or abrupt weight changes.   Neurological: t reports insomnia. Denies dizziness, difficulty with memory, difficulty with speech or problems with balance and coordination.  Psych: Pt reports anxiety and depression. Denies SI/HI.  No other specific complaints in a complete review of systems (except as listed in HPI above).  Objective:   Physical Exam   BP 124/82 mmHg  Pulse 106   Temp(Src) 98 F (36.7 C) (Oral)  Wt   SpO2 99%  LMP 12/17/2015 Wt Readings from Last 3 Encounters:  06/22/15 168 lb (76.204 kg)  04/20/15 174 lb (78.926 kg)  02/03/15 181 lb (82.101 kg)    General: Appears her stated age, obese in NAD. Neurological: Awake and oriented. She does seem very tired.  Psychiatric: Mood and affect flat. She is contracting for safety at this time.    BMET    Component Value Date/Time   NA 139 06/22/2015 1541   K 3.9 06/22/2015 1541   CL 108 06/22/2015 1541   CO2 25 06/22/2015 1541   GLUCOSE 91 06/22/2015 1541   BUN 15 06/22/2015 1541   CREATININE 0.70 06/22/2015 1541   CALCIUM 9.1 06/22/2015 1541   GFRNONAA >90 09/18/2014 0110   GFRAA >90 09/18/2014 0110    Lipid Panel     Component Value Date/Time   CHOL 174 04/04/2012 1410   TRIG 62.0 04/04/2012 1410   HDL 58.30 04/04/2012 1410   CHOLHDL 3 04/04/2012 1410   VLDL 12.4 04/04/2012 1410   LDLCALC 103* 04/04/2012 1410    CBC    Component Value Date/Time   WBC 6.9 06/22/2015 1541   WBC 8.4 11/17/2014 1441   RBC 4.01 06/22/2015 1541   RBC 4.05 11/17/2014 1441   HGB 12.6 06/22/2015 1541   HGB 12.7 11/17/2014 1441   HCT 37.1 06/22/2015 1541   HCT 38.5 11/17/2014 1441   PLT 215.0 06/22/2015 1541   PLT 206 11/17/2014 1441   MCV 92.6 06/22/2015 1541   MCV 95 11/17/2014 1441   MCH 31.4 11/17/2014 1441   MCH 32.8 09/19/2014 0605   MCHC 34.0 06/22/2015 1541   MCHC 33.0 11/17/2014 1441   RDW 13.4 06/22/2015 1541   RDW 12.7 11/17/2014 1441   LYMPHSABS 2.0 06/22/2015 1541   LYMPHSABS 1.5 11/17/2014 1441   MONOABS 0.5 06/22/2015 1541   MONOABS 0.9 11/17/2014 1441   EOSABS 0.1 06/22/2015 1541   EOSABS 0.1 11/17/2014 1441   BASOSABS 0.1 06/22/2015 1541   BASOSABS 0.0 11/17/2014 1441    Hgb A1C No results found for: HGBA1C      Assessment & Plan:

## 2015-12-21 NOTE — Progress Notes (Signed)
Pre visit review using our clinic review tool, if applicable. No additional management support is needed unless otherwise documented below in the visit note. 

## 2015-12-21 NOTE — Patient Instructions (Signed)
Major Depressive Disorder Major depressive disorder is a mental illness. It also may be called clinical depression or unipolar depression. Major depressive disorder usually causes feelings of sadness, hopelessness, or helplessness. Some people with this disorder do not feel particularly sad but lose interest in doing things they used to enjoy (anhedonia). Major depressive disorder also can cause physical symptoms. It can interfere with work, school, relationships, and other normal everyday activities. The disorder varies in severity but is longer lasting and more serious than the sadness we all feel from time to time in our lives. Major depressive disorder often is triggered by stressful life events or major life changes. Examples of these triggers include divorce, loss of your job or home, a move, and the death of a family member or close friend. Sometimes this disorder occurs for no obvious reason at all. People who have family members with major depressive disorder or bipolar disorder are at higher risk for developing this disorder, with or without life stressors. Major depressive disorder can occur at any age. It may occur just once in your life (single episode major depressive disorder). It may occur multiple times (recurrent major depressive disorder). SYMPTOMS People with major depressive disorder have either anhedonia or depressed mood on nearly a daily basis for at least 2 weeks or longer. Symptoms of depressed mood include:  Feelings of sadness (blue or down in the dumps) or emptiness.  Feelings of hopelessness or helplessness.  Tearfulness or episodes of crying (may be observed by others).  Irritability (children and adolescents). In addition to depressed mood or anhedonia or both, people with this disorder have at least four of the following symptoms:  Difficulty sleeping or sleeping too much.   Significant change (increase or decrease) in appetite or weight.   Lack of energy or  motivation.  Feelings of guilt and worthlessness.   Difficulty concentrating, remembering, or making decisions.  Unusually slow movement (psychomotor retardation) or restlessness (as observed by others).   Recurrent wishes for death, recurrent thoughts of self-harm (suicide), or a suicide attempt. People with major depressive disorder commonly have persistent negative thoughts about themselves, other people, and the world. People with severe major depressive disorder may experiencedistorted beliefs or perceptions about the world (psychotic delusions). They also may see or hear things that are not real (psychotic hallucinations). DIAGNOSIS Major depressive disorder is diagnosed through an assessment by your health care provider. Your health care provider will ask aboutaspects of your daily life, such as mood,sleep, and appetite, to see if you have the diagnostic symptoms of major depressive disorder. Your health care provider may ask about your medical history and use of alcohol or drugs, including prescription medicines. Your health care provider also may do a physical exam and blood work. This is because certain medical conditions and the use of certain substances can cause major depressive disorder-like symptoms (secondary depression). Your health care provider also may refer you to a mental health specialist for further evaluation and treatment. TREATMENT It is important to recognize the symptoms of major depressive disorder and seek treatment. The following treatments can be prescribed for this disorder:   Medicine. Antidepressant medicines usually are prescribed. Antidepressant medicines are thought to correct chemical imbalances in the brain that are commonly associated with major depressive disorder. Other types of medicine may be added if the symptoms do not respond to antidepressant medicines alone or if psychotic delusions or hallucinations occur.  Talk therapy. Talk therapy can be  helpful in treating major depressive disorder by providing   support, education, and guidance. Certain types of talk therapy also can help with negative thinking (cognitive behavioral therapy) and with relationship issues that trigger this disorder (interpersonal therapy). A mental health specialist can help determine which treatment is best for you. Most people with major depressive disorder do well with a combination of medicine and talk therapy. Treatments involving electrical stimulation of the brain can be used in situations with extremely severe symptoms or when medicine and talk therapy do not work over time. These treatments include electroconvulsive therapy, transcranial magnetic stimulation, and vagal nerve stimulation.   This information is not intended to replace advice given to you by your health care provider. Make sure you discuss any questions you have with your health care provider.   Document Released: 03/18/2013 Document Revised: 12/12/2014 Document Reviewed: 03/18/2013 Elsevier Interactive Patient Education 2016 [employer]  

## 2015-12-21 NOTE — Assessment & Plan Note (Signed)
Deteriorated She is contracting for safety now She will continue to stay with her in laws, they will manage her medications (the bottles were given to father-in-law) She will go to her counseling apt tomorrow, her therapist will call me after the appt Stop Prozac, start Paxil, eRx sent to pharmacy eRx for Seroquel 12.5 mg tabs, QHS for insomnia and night mares Number given for Suicide Hotline Father-in-law understands to call police or [name] concerns  RTC in 3 weeks to reevaluate, sooner if needed

## 2015-12-24 ENCOUNTER — Telehealth: Payer: Self-pay | Admitting: Internal Medicine

## 2015-12-24 NOTE — Telephone Encounter (Signed)
Chrissie Noa returned your call about pt Best number [phone] Chrissie Noa stated it was urgent

## 2015-12-24 NOTE — Telephone Encounter (Signed)
Patient returned Melanie's call.  You can reach her at [phone], if you can't reach her on her phone.

## 2015-12-24 NOTE — Telephone Encounter (Signed)
Left message on voicemail.

## 2015-12-25 ENCOUNTER — Encounter: Payer: Self-pay | Admitting: Internal Medicine

## 2016-01-11 ENCOUNTER — Encounter: Payer: Self-pay | Admitting: Internal Medicine

## 2016-01-11 ENCOUNTER — Ambulatory Visit (INDEPENDENT_AMBULATORY_CARE_PROVIDER_SITE_OTHER): Payer: 59 | Admitting: Internal Medicine

## 2016-01-11 VITALS — BP 116/78 | HR 83 | Temp 98.5°F | Wt 179.0 lb

## 2016-01-11 DIAGNOSIS — F418 Other specified anxiety disorders: Secondary | ICD-10-CM | POA: Diagnosis not present

## 2016-01-11 DIAGNOSIS — F329 Major depressive disorder, single episode, unspecified: Secondary | ICD-10-CM

## 2016-01-11 DIAGNOSIS — F419 Anxiety disorder, unspecified: Principal | ICD-10-CM

## 2016-01-11 MED ORDER — ALPRAZOLAM 0.25 MG PO TABS: 0.2500 mg | ORAL_TABLET | Freq: Two times a day (BID) | ORAL | Status: DC | PRN

## 2016-01-11 NOTE — Patient Instructions (Signed)
Generalized Anxiety Disorder Generalized anxiety disorder (GAD) is a mental disorder. It interferes with life functions, including relationships, work, and school. GAD is different from normal anxiety, which everyone experiences at some point in their lives in response to specific life events and activities. Normal anxiety actually helps us prepare for and get through these life events and activities. Normal anxiety goes away after the event or activity is over.  GAD causes anxiety that is not necessarily related to specific events or activities. It also causes excess anxiety in proportion to specific events or activities. The anxiety associated with GAD is also difficult to control. GAD can vary from mild to severe. People with severe GAD can have intense waves of anxiety with physical symptoms (panic attacks).  SYMPTOMS The anxiety and worry associated with GAD are difficult to control. This anxiety and worry are related to many life events and activities and also occur more days than not for 6 months or longer. People with GAD also have three or more of the following symptoms (one or more in children):  Restlessness.   Fatigue.  Difficulty concentrating.   Irritability.  Muscle tension.  Difficulty sleeping or unsatisfying sleep. DIAGNOSIS GAD is diagnosed through an assessment by your health care provider. Your health care provider will ask you questions aboutyour mood,physical symptoms, and events in your life. Your health care provider may ask you about your medical history and use of alcohol or drugs, including prescription medicines. Your health care provider may also do a physical exam and blood tests. Certain medical conditions and the use of certain substances can cause symptoms similar to those associated with GAD. Your health care provider may refer you to a mental health specialist for further evaluation. TREATMENT The following therapies are usually used to treat GAD:    Medication. Antidepressant medication usually is prescribed for long-term daily control. Antianxiety medicines may be added in severe cases, especially when panic attacks occur.   Talk therapy (psychotherapy). Certain types of talk therapy can be helpful in treating GAD by providing support, education, and guidance. A form of talk therapy called cognitive behavioral therapy can teach you healthy ways to think about and react to daily life events and activities.  Stress managementtechniques. These include yoga, meditation, and exercise and can be very helpful when they are practiced regularly. A mental health specialist can help determine which treatment is best for you. Some people see improvement with one therapy. However, other people require a combination of therapies.   This information is not intended to replace advice given to you by your health care provider. Make sure you discuss any questions you have with your health care provider.   Document Released: 03/18/2013 Document Revised: 12/12/2014 Document Reviewed: 03/18/2013 Elsevier Interactive Patient Education 2016 Elsevier Inc.  

## 2016-01-11 NOTE — Progress Notes (Signed)
Pre visit review using our clinic review tool, if applicable. No additional management support is needed unless otherwise documented below in the visit note. 

## 2016-01-11 NOTE — Progress Notes (Signed)
Subjective:    Patient ID: [name] Sanchez, female    DOB: 27-Apr-1996, 20 y.o.   MRN: [id number]  HPI  Pt presents to the clinic today for 3 week follow up of anxiety and depression. This was being triggered by marital issues. She was having suicidal thoughts at one point, but that has resolved. She is currently staying with her in-laws, and they are distributing her medications. She has noticed improvement in her anxiety and depression since starting the Paxil. She is seeing a therapist weekly. She has started going to the gym, and taking more time to focus on herself. She still occasionally gets anxious in the afternoons, when normally she would be spending time with her kids. Since, she started going to the gym, she has not had the muscle cramps in her legs. She still has trouble falling asleep and often has nightmares at night. She reports her relationship with her husband are improving. He saw his PCP and got started back on some of his medications. He is also seeing a therapist weekly. She is concerned about an upcoming birthday party for her oldest son this Sunday. She reports her mother and some other family members will be there that cause her a lot of stress. She is not sure how she is planning on handling this. She denies SI/HI.   Review of Systems      Past Medical History  Diagnosis Date  . Hx of ovarian cyst     x 2-3  . Gestational hypertension 01/2012    also had postpartum preelampsia  . Plica of knee 08/2012    medial plica left knee  . Migraine   . Anxiety   . Panic attack   . Hx of varicella     Current Outpatient Prescriptions  Medication Sig Dispense Refill  . ibuprofen (ADVIL,MOTRIN) 800 MG tablet take 1 tablet by mouth three times a day as needed  0  . PARoxetine (PAXIL) 20 MG tablet Take 1 tablet (20 mg total) by mouth daily. 30 tablet 2  . QUEtiapine (SEROQUEL) 25 MG tablet Take 0.5 tablets (12.5 mg total) by mouth at bedtime. 15 tablet 0  . topiramate  (TOPAMAX) 25 MG tablet Take 100 mg by mouth daily.      No current facility-administered medications for this visit.    Allergies  Allergen Reactions  . Cefuroxime Axetil Rash    Family History  Problem Relation Age of Onset  . Hypertension Mother   . Heart disease Mother     MI  . Anesthesia problems Mother     post-op N/V  . Asthma Sister   . Bipolar disorder Sister   . Asthma Brother   . Diabetes Maternal Aunt   . Heart disease Father   . Bipolar disorder Father     Social History   Social History  . Marital Status: Married    Spouse Name: N/A  . Number of Children: N/A  . Years of Education: N/A   Occupational History  . Not on file.   Social History Main Topics  . Smoking status: Never Smoker   . Smokeless tobacco: Never Used  . Alcohol Use: No  . Drug Use: No  . Sexual Activity: Yes    Birth Control/ Protection: None     Comment: last night   Other Topics Concern  . Not on file   Social History Narrative     Constitutional: Denies fever, malaise, fatigue, headache or abrupt weight changes.  Neurological:  Denies dizziness, difficulty with memory, difficulty with speech or problems with balance and coordination.  Psych: Pt reports anxiety and depression. Denies SI/HI.  No other specific complaints in a complete review of systems (except as listed in HPI above).  Objective:   Physical Exam   BP 116/78 mmHg  Pulse 83  Temp(Src) 98.5 F (36.9 C) (Oral)  Wt 179 lb (81.194 kg)  SpO2 99%  LMP 12/17/2015 Wt Readings from Last 3 Encounters:  01/11/16 179 lb (81.194 kg)  06/22/15 168 lb (76.204 kg)  04/20/15 174 lb (78.926 kg)    General: Appears her stated age, well developed, well nourished in NAD. Cardiovascular: Normal rate and rhythm. S1,S2 noted.  No murmur, rubs or gallops noted.  Pulmonary/Chest: Normal effort and positive vesicular breath sounds. No respiratory distress. No wheezes, rales or ronchi noted.  Neurological: Alert and  oriented.  Psychiatric: She is engaging and smiling. She makes good eye contact. She does appear to get choked up every now and again during the interview but is not tearful.  BMET    Component Value Date/Time   NA 139 06/22/2015 1541   K 3.9 06/22/2015 1541   CL 108 06/22/2015 1541   CO2 25 06/22/2015 1541   GLUCOSE 91 06/22/2015 1541   BUN 15 06/22/2015 1541   CREATININE 0.70 06/22/2015 1541   CALCIUM 9.1 06/22/2015 1541   GFRNONAA >90 09/18/2014 0110   GFRAA >90 09/18/2014 0110    Lipid Panel     Component Value Date/Time   CHOL 174 04/04/2012 1410   TRIG 62.0 04/04/2012 1410   HDL 58.30 04/04/2012 1410   CHOLHDL 3 04/04/2012 1410   VLDL 12.4 04/04/2012 1410   LDLCALC 103* 04/04/2012 1410    CBC    Component Value Date/Time   WBC 6.9 06/22/2015 1541   WBC 8.4 11/17/2014 1441   RBC 4.01 06/22/2015 1541   RBC 4.05 11/17/2014 1441   HGB 12.6 06/22/2015 1541   HGB 12.7 11/17/2014 1441   HCT 37.1 06/22/2015 1541   HCT 38.5 11/17/2014 1441   PLT 215.0 06/22/2015 1541   PLT 206 11/17/2014 1441   MCV 92.6 06/22/2015 1541   MCV 95 11/17/2014 1441   MCH 31.4 11/17/2014 1441   MCH 32.8 09/19/2014 0605   MCHC 34.0 06/22/2015 1541   MCHC 33.0 11/17/2014 1441   RDW 13.4 06/22/2015 1541   RDW 12.7 11/17/2014 1441   LYMPHSABS 2.0 06/22/2015 1541   LYMPHSABS 1.5 11/17/2014 1441   MONOABS 0.5 06/22/2015 1541   MONOABS 0.9 11/17/2014 1441   EOSABS 0.1 06/22/2015 1541   EOSABS 0.1 11/17/2014 1441   BASOSABS 0.1 06/22/2015 1541   BASOSABS 0.0 11/17/2014 1441    Hgb A1C No results found for: HGBA1C      Assessment & Plan:   Anxiety and Depression:  Improving with Paxil and therapy Continue going to the gyn- this helps to increase endorphins We will continue Seroquel for a short while longer, while she is actively having nightmares, but did discuss the importance of weaning her off of this in the near future. Will give low dose Xanax (which she was on before) for  situations like the birthday party this Sunday. She will give the RX to her in-laws to distribute to her along with her other meds  RTC in 6 months to follow up

## 2016-01-22 ENCOUNTER — Other Ambulatory Visit: Payer: Self-pay | Admitting: Obstetrics and Gynecology

## 2016-01-22 DIAGNOSIS — R5383 Other fatigue: Secondary | ICD-10-CM | POA: Diagnosis not present

## 2016-01-22 DIAGNOSIS — R61 Generalized hyperhidrosis: Secondary | ICD-10-CM | POA: Diagnosis not present

## 2016-01-22 DIAGNOSIS — Z130 Encounter for screening for diseases of the blood and blood-forming organs and certain disorders involving the immune mechanism: Secondary | ICD-10-CM | POA: Diagnosis not present

## 2016-01-22 DIAGNOSIS — N644 Mastodynia: Secondary | ICD-10-CM | POA: Diagnosis not present

## 2016-01-25 ENCOUNTER — Ambulatory Visit (INDEPENDENT_AMBULATORY_CARE_PROVIDER_SITE_OTHER): Payer: 59 | Admitting: Internal Medicine

## 2016-01-25 ENCOUNTER — Encounter: Payer: Self-pay | Admitting: Internal Medicine

## 2016-01-25 VITALS — BP 118/74 | HR 82 | Temp 98.3°F | Wt 176.0 lb

## 2016-01-25 DIAGNOSIS — L732 Hidradenitis suppurativa: Secondary | ICD-10-CM | POA: Diagnosis not present

## 2016-01-25 MED ORDER — CLINDAMYCIN PHOSPHATE 1 % EX GEL: Freq: Two times a day (BID) | CUTANEOUS | Status: DC

## 2016-01-25 NOTE — Patient Instructions (Signed)

## 2016-01-25 NOTE — Progress Notes (Signed)
Pre visit review using our clinic review tool, if applicable. No additional management support is needed unless otherwise documented below in the visit note. 

## 2016-01-25 NOTE — Progress Notes (Signed)
Subjective:    Patient ID: [name] Sanchez, female    DOB: Nov 01, 1996, 20 y.o.   MRN: [id number]  HPI  Pt presents to the clinic today with c/o a knot under her left arm. She noticed this 1 week ago. The lump is tender to touch. She did go see her GYN for the same. Her GYN ordered a ultrasound of her left breast, but she was advised to follow up with her PCP for a CT of her head and neck, for reoccurring lymphadenopathy. She reports that a few months ago, she had swollen glands in her neck and had to go to UC x 2, then ENT. She was on antibiotics x 2 weeks. She did have some associated fatigue and low WBC count during that time. She is very anxious about all of this and concerned that she may have some type of cancer. She has no family history of lymphoma.  Review of Systems      Past Medical History  Diagnosis Date  . Hx of ovarian cyst     x 2-3  . Gestational hypertension 01/2012    also had postpartum preelampsia  . Plica of knee 08/2012    medial plica left knee  . Migraine   . Anxiety   . Panic attack   . Hx of varicella     Current Outpatient Prescriptions  Medication Sig Dispense Refill  . ALPRAZolam (XANAX) 0.25 MG tablet Take 1 tablet (0.25 mg total) by mouth 2 (two) times daily as needed for anxiety. 20 tablet 0  . ibuprofen (ADVIL,MOTRIN) 800 MG tablet take 1 tablet by mouth three times a day as needed  0  . PARoxetine (PAXIL) 20 MG tablet Take 1 tablet (20 mg total) by mouth daily. 30 tablet 2  . topiramate (TOPAMAX) 25 MG tablet Take 100 mg by mouth daily.     . QUEtiapine (SEROQUEL) 25 MG tablet Take 0.5 tablets (12.5 mg total) by mouth at bedtime. (Patient not taking: Reported on 01/25/2016) 15 tablet 0   No current facility-administered medications for this visit.    Allergies  Allergen Reactions  . Cefuroxime Axetil Rash    Family History  Problem Relation Age of Onset  . Hypertension Mother   . Heart disease Mother     MI  . Anesthesia problems Mother      post-op N/V  . Asthma Sister   . Bipolar disorder Sister   . Asthma Brother   . Diabetes Maternal Aunt   . Heart disease Father   . Bipolar disorder Father     Social History   Social History  . Marital Status: Married    Spouse Name: N/A  . Number of Children: N/A  . Years of Education: N/A   Occupational History  . Not on file.   Social History Main Topics  . Smoking status: Never Smoker   . Smokeless tobacco: Never Used  . Alcohol Use: No  . Drug Use: No  . Sexual Activity: Yes    Birth Control/ Protection: None     Comment: last night   Other Topics Concern  . Not on file   Social History Narrative     Constitutional: Pt reports fatigue. Denies fever, malaise, headache or abrupt weight changes.  HEENT: Denies eye pain, eye redness, ear pain, ringing in the ears, wax buildup, runny nose, nasal congestion, bloody nose, or sore throat. Respiratory: Denies difficulty breathing, shortness of breath, cough or sputum production.   Cardiovascular: Denies  chest pain, chest tightness, palpitations or swelling in the hands or feet.  Skin: Pt reports lump in left armpit. Denies redness, rashes, or ulcercations.  Neurological: Denies dizziness, difficulty with memory, difficulty with speech or problems with balance and coordination.    No other specific complaints in a complete review of systems (except as listed in HPI above).  Objective:   Physical Exam  BP 118/74 mmHg  Pulse 82  Temp(Src) 98.3 F (36.8 C) (Oral)  Wt 176 lb (79.833 kg)  SpO2 99%  LMP 01/16/2016  Wt Readings from Last 3 Encounters:  01/25/16 176 lb (79.833 kg)  01/11/16 179 lb (81.194 kg)  06/22/15 168 lb (76.204 kg)    General: Appears her stated age, in NAD. Skin: 1 cm oval nodule noted in left armpit. She has some scattered pustules noted in the left armpit as well. Neck:  Neck supple, trachea midline. No masses, lumps or thyromegaly present.  Cardiovascular: Normal rate and rhythm.  S1,S2 noted.  No murmur, rubs or gallops noted. Pulmonary/Chest: Normal effort and positive vesicular breath sounds. No respiratory distress. No wheezes, rales or ronchi noted.  Neurological: Alert and oriented.  normal.     BMET    Component Value Date/Time   NA 139 06/22/2015 1541   K 3.9 06/22/2015 1541   CL 108 06/22/2015 1541   CO2 25 06/22/2015 1541   GLUCOSE 91 06/22/2015 1541   BUN 15 06/22/2015 1541   CREATININE 0.70 06/22/2015 1541   CALCIUM 9.1 06/22/2015 1541   GFRNONAA >90 09/18/2014 0110   GFRAA >90 09/18/2014 0110    Lipid Panel     Component Value Date/Time   CHOL 174 04/04/2012 1410   TRIG 62.0 04/04/2012 1410   HDL 58.30 04/04/2012 1410   CHOLHDL 3 04/04/2012 1410   VLDL 12.4 04/04/2012 1410   LDLCALC 103* 04/04/2012 1410    CBC    Component Value Date/Time   WBC 6.9 06/22/2015 1541   WBC 8.4 11/17/2014 1441   RBC 4.01 06/22/2015 1541   RBC 4.05 11/17/2014 1441   HGB 12.6 06/22/2015 1541   HGB 12.7 11/17/2014 1441   HCT 37.1 06/22/2015 1541   HCT 38.5 11/17/2014 1441   PLT 215.0 06/22/2015 1541   PLT 206 11/17/2014 1441   MCV 92.6 06/22/2015 1541   MCV 95 11/17/2014 1441   MCH 31.4 11/17/2014 1441   MCH 32.8 09/19/2014 0605   MCHC 34.0 06/22/2015 1541   MCHC 33.0 11/17/2014 1441   RDW 13.4 06/22/2015 1541   RDW 12.7 11/17/2014 1441   LYMPHSABS 2.0 06/22/2015 1541   LYMPHSABS 1.5 11/17/2014 1441   MONOABS 0.5 06/22/2015 1541   MONOABS 0.9 11/17/2014 1441   EOSABS 0.1 06/22/2015 1541   EOSABS 0.1 11/17/2014 1441   BASOSABS 0.1 06/22/2015 1541   BASOSABS 0.0 11/17/2014 1441    Hgb A1C No results found for: HGBA1C       Assessment & Plan:   Hidradenitis Axillaris:  No need to get CT of the head/neck She will get [place] of left breast/axilla tomorrow, will have results faxed over to me CBC with diff was just repeated by GYN Discussed treatment options eRx for Clindamycin gel 1% BID x 12 weeks  RTC as needed or if symptoms persist  or worsen

## 2016-01-26 ENCOUNTER — Ambulatory Visit
Admission: RE | Admit: 2016-01-26 | Discharge: 2016-01-26 | Disposition: A | Discharge status: Home / Self Care | Payer: 59 | Source: Ambulatory Visit | Attending: Obstetrics and Gynecology | Admitting: Obstetrics and Gynecology

## 2016-01-26 DIAGNOSIS — N644 Mastodynia: Secondary | ICD-10-CM | POA: Diagnosis not present

## 2016-02-22 ENCOUNTER — Encounter: Payer: Self-pay | Admitting: Family Medicine

## 2016-02-22 ENCOUNTER — Ambulatory Visit (INDEPENDENT_AMBULATORY_CARE_PROVIDER_SITE_OTHER): Payer: 59 | Admitting: Family Medicine

## 2016-02-22 VITALS — BP 120/82 | HR 74 | Temp 97.9°F | Wt 181.8 lb

## 2016-02-22 DIAGNOSIS — J029 Acute pharyngitis, unspecified: Secondary | ICD-10-CM

## 2016-02-22 DIAGNOSIS — H9202 Otalgia, left ear: Secondary | ICD-10-CM

## 2016-02-22 LAB — POCT RAPID STREP A (OFFICE): Rapid Strep A Screen: NEGATIVE

## 2016-02-22 NOTE — Progress Notes (Signed)
Pre visit review using our clinic review tool, if applicable. No additional management support is needed unless otherwise documented below in the visit note. 

## 2016-02-22 NOTE — Progress Notes (Signed)
Patient ID: [name] Salisburyshley N Baba, female   DOB: 02-Oct-1996, 20 y.o.   MRN: [id number]  Marikay AlarEric Thuy Atilano, MD Phone: [phone]  [name] Sanchez is a 20 y.o. female who presents today for a same day visit.  Patient reports 1-1/2 weeks of left ear pain. She notes left-sided throat discomfort as well with left anterior lymph node tenderness. Notes she has had recurrent ear infections throughout the years. Had tympanostomy tubes when she was a child. Has 2 ear infections per year. She notes there has been viral illnesses and strep throat going around her house. She was blowing some green mucus out of her nose and coughing some green mucus up. No cough at this time. No fever. No nasal or sinus congestion. No postnasal drip. She notes she has been evaluated several times in the last several months for this exact same issue. It comes and goes. She saw ear nose and throat physician and they put her on antibiotics. They told her to follow-up if she had recurrent discomfort. She has not followed up with them. No trouble swallowing or talking or breathing.  PMH: nonsmoker.   ROS see history of present illness  Objective  Physical Exam Filed Vitals:   02/22/16 1323  BP: 120/82  Pulse: 74  Temp: 97.9 F (36.6 C)    BP Readings from Last 3 Encounters:  02/22/16 120/82  01/25/16 118/74  01/11/16 116/78   Wt Readings from Last 3 Encounters:  02/22/16 181 lb 12.8 oz (82.464 kg)  01/25/16 176 lb (79.833 kg)  01/11/16 179 lb (81.194 kg)    Physical Exam  Constitutional: She is well-developed, well-nourished, and in no distress.  HENT:  Head: Normocephalic and atraumatic.  Right Ear: External ear normal.  Left Ear: External ear normal.  Mouth/Throat: No oropharyngeal exudate.  Minimal posterior oropharyngeal erythema, no tonsillar swelling, uvula is midline, bilateral TMs with Myringosclerosis, no liquid by the TM, no purulent equipment behind the TM  Eyes: Conjunctivae are normal. Pupils are  equal, round, and reactive to light.  Neck: Neck supple.  Mildly tender left-sided cervical adenopathy  Cardiovascular: Normal rate, regular rhythm and normal heart sounds.   Pulmonary/Chest: Effort normal and breath sounds normal.  Neurological: She is alert. Gait normal.  Skin: Skin is warm and dry. She is not diaphoretic.     Assessment/Plan: Please see individual problem list.  Left ear pain Appears to be a recurrent intermittent issue. No signs of infection on exam. Rapid strep was negative as well. We'll send strep for culture given her sore throat. She's been evaluated by ENT for this earlier this year. Given the recurrent nature of this it would be beneficial for follow-up with him. We'll place a referral to facilitate this appointment. She is given return precautions.    Orders Placed This Encounter  Procedures  . Culture, Group A Strep    Order Specific Question:  Source    Answer:  pharynx  . Ambulatory referral to ENT    Referral Priority:  Routine    Referral Type:  Consultation    Referral Reason:  Specialty Services Required    Requested Specialty:  Otolaryngology    Number of Visits Requested:  1  . POCT rapid strep A    Marikay AlarEric Krissie Merrick, MD [facility] [place]

## 2016-02-22 NOTE — Assessment & Plan Note (Signed)
Appears to be a recurrent intermittent issue. No signs of infection on exam. Rapid strep was negative as well. We'll send strep for culture given her sore throat. She's been evaluated by ENT for this earlier this year. Given the recurrent nature of this it would be beneficial for follow-up with him. We'll place a referral to facilitate this appointment. She is given return precautions.

## 2016-02-22 NOTE — Patient Instructions (Signed)
Nice to meet you. Her symptoms are likely related to a virus. We'll treat you with over-the-counter Claritin and Flonase to try to decrease inflammation of your eustachian tubes. We will send your throat swab for culture to ensure that there is no strep throat infection. If you develop fevers, change in speech, inability to swallow, cough productive of blood, trouble breathing, or any new or changing symptoms please seek medical attention.

## 2016-02-24 LAB — CULTURE, GROUP A STREP: ORGANISM ID, BACTERIA: NORMAL

## 2016-02-29 DIAGNOSIS — G43119 Migraine with aura, intractable, without status migrainosus: Secondary | ICD-10-CM | POA: Diagnosis not present

## 2016-03-24 ENCOUNTER — Other Ambulatory Visit: Payer: Self-pay | Admitting: Internal Medicine

## 2016-03-24 DIAGNOSIS — F609 Personality disorder, unspecified: Secondary | ICD-10-CM | POA: Diagnosis not present

## 2016-03-29 DIAGNOSIS — N390 Urinary tract infection, site not specified: Secondary | ICD-10-CM | POA: Diagnosis not present

## 2016-03-29 DIAGNOSIS — N920 Excessive and frequent menstruation with regular cycle: Secondary | ICD-10-CM | POA: Diagnosis not present

## 2016-03-29 DIAGNOSIS — N760 Acute vaginitis: Secondary | ICD-10-CM | POA: Diagnosis not present

## 2016-03-29 DIAGNOSIS — N72 Inflammatory disease of cervix uteri: Secondary | ICD-10-CM | POA: Diagnosis not present

## 2016-03-29 DIAGNOSIS — R102 Pelvic and perineal pain: Secondary | ICD-10-CM | POA: Diagnosis not present

## 2016-03-31 DIAGNOSIS — F609 Personality disorder, unspecified: Secondary | ICD-10-CM | POA: Diagnosis not present

## 2016-04-08 ENCOUNTER — Ambulatory Visit (HOSPITAL_COMMUNITY)
Admission: RE | Admit: 2016-04-08 | Discharge: 2016-04-08 | Disposition: A | Discharge status: Home / Self Care | Payer: 59 | Source: Ambulatory Visit | Attending: Obstetrics and Gynecology | Admitting: Obstetrics and Gynecology

## 2016-04-08 ENCOUNTER — Other Ambulatory Visit (HOSPITAL_COMMUNITY): Payer: Self-pay | Admitting: Obstetrics and Gynecology

## 2016-04-08 DIAGNOSIS — R102 Pelvic and perineal pain: Secondary | ICD-10-CM | POA: Diagnosis not present

## 2016-04-08 DIAGNOSIS — R1031 Right lower quadrant pain: Secondary | ICD-10-CM | POA: Insufficient documentation

## 2016-04-08 MED ORDER — IOHEXOL 350 MG/ML SOLN
50.0000 mL | Freq: Once | INTRAVENOUS | Status: AC | PRN
Start: 2016-04-08 — End: 2016-04-08
  Administered 2016-04-08: 50 mL via ORAL

## 2016-04-08 MED ORDER — IOPAMIDOL (ISOVUE-300) INJECTION 61%
100.0000 mL | Freq: Once | INTRAVENOUS | Status: AC | PRN
  Administered 2016-04-08: 100 mL via INTRAVENOUS

## 2016-04-12 ENCOUNTER — Ambulatory Visit (INDEPENDENT_AMBULATORY_CARE_PROVIDER_SITE_OTHER): Payer: 59 | Admitting: Internal Medicine

## 2016-04-12 ENCOUNTER — Encounter: Payer: Self-pay | Admitting: Internal Medicine

## 2016-04-12 ENCOUNTER — Telehealth: Payer: Self-pay

## 2016-04-12 VITALS — BP 118/82 | HR 80 | Temp 98.8°F | Wt 183.0 lb

## 2016-04-12 DIAGNOSIS — R1084 Generalized abdominal pain: Secondary | ICD-10-CM

## 2016-04-12 DIAGNOSIS — K219 Gastro-esophageal reflux disease without esophagitis: Secondary | ICD-10-CM | POA: Diagnosis not present

## 2016-04-12 NOTE — Telephone Encounter (Signed)
Unable to reach pt by phone.

## 2016-04-12 NOTE — Telephone Encounter (Signed)
Pt has appt schedule for today

## 2016-04-12 NOTE — Telephone Encounter (Signed)
PLEASE NOTE: All timestamps contained within this report are represented as [place] Time. CONFIDENTIALTY NOTICE: This fax transmission is intended only for the addressee. It contains information that is legally privileged, confidential or otherwise protected from use or disclosure. If you are not the intended recipient, you are strictly prohibited from reviewing, disclosing, copying using or disseminating any of this information or taking any action in reliance on or regarding this information. If you have received this fax in error, please notify us immediately by telephone so that we can arrange for its return to us. Phone: [phone], Toll-Free: [phone], Fax: [phone] Page: 1 of 2 Call Id: [id number] [facility] - Client TELEPHONE ADVICE RECORD [facility] Patient Name: [name] Sanchez Gender: Female DOB: 06-19-1996 Age: [age] Y 4 M 25 D Return Phone Number: [phone] (Primary) Address: City/State/Zip: [place] Client [facility] Client [facility] Site [facility] Physician Nicki ReaperBaity, Regina - NP Contact Type Call Who Is Calling Patient / Member / Family / Caregiver Call Type Triage / Clinical Relationship To Patient Self Return Phone Number [phone] (Primary) Chief Complaint Abdominal Pain Reason for Call Symptomatic / Request for Health Information Initial Comment Caller states Abd pain and burning PreDisposition Home Care Translation No Nurse Assessment Nurse: Logan BoresEvans, RN, Melissa Date/Time (Eastern Time): 04/11/2016 5:31:00 PM Confirm and document reason for call. If symptomatic, describe symptoms. You must click the next button to save text entered. ---Caller states Abd pain and burning Has the patient traveled out of the country within the last 30 days? ---Not Applicable Does the patient have any new or worsening symptoms? ---Yes Will a triage be completed?  ---Yes Related visit to physician within the last 2 weeks? ---Yes Does the PT have any chronic conditions? (i.e. diabetes, asthma, etc.) ---No Is the patient pregnant or possibly pregnant? (Ask all females between the ages of 2212-55) ---No Is this a behavioral health or substance abuse call? ---No Guidelines Guideline Title Affirmed Question Affirmed Notes Nurse Date/Time (Eastern Time) Abdominal Pain - Female [1] MILD-MODERATE pain AND [2] constant AND [3] present > 2 hours Logan BoresEvans, RN, Melissa 04/11/2016 5:34:58 PM Disp. Time Lamount Cohen(Eastern Time) Disposition Final User 04/11/2016 5:38:05 PM See Physician within 4 Hours (or PCP triage) Yes Logan BoresEvans, RN, Melissa PLEASE NOTE: All timestamps contained within this report are represented as [place] Standard Time. CONFIDENTIALTY NOTICE: This fax transmission is intended only for the addressee. It contains information that is legally privileged, confidential or otherwise protected from use or disclosure. If you are not the intended recipient, you are strictly prohibited from reviewing, disclosing, copying using or disseminating any of this information or taking any action in reliance on or regarding this information. If you have received this fax in error, please notify us immediately by telephone so that we can arrange for its return to us. Phone: [phone], Toll-Free: [phone], Fax: [phone] Page: 2 of 2 Call Id: [id number] Caller Understands: Yes Disagree/Comply: Comply Care Advice Given Per Guideline REST: Lie down and rest until seen. NOTHING BY MOUTH: Do not eat or drink anything for now. CALL BACK IF: * You become worse. SEE PHYSICIAN WITHIN 4 HOURS (or PCP triage): Referrals GO TO FACILITY UNDECIDED

## 2016-04-12 NOTE — Progress Notes (Signed)
Subjective:    Patient ID: [name] Sanchez, female    DOB: 03-05-1996, 20 y.o.   MRN: [id number]  HPI  Pt presents to the clinic today with c/o abdominal pain. This started 2 weeks ago. The pain is located around her belly button. It does radiate to her upper and lower abdomen. She describes the pain as a burning sensation. It seems worse after eating or drinking, and with have a BM. She does have nausea but denies vomiting, dark stool or blood in her stool. She went to her OB 4 days ago. CT scan of the abdomen was normal. She has not tried anything OTC because she did not know what to take. She is not sure if she has heartburn or reflux. She has had stomach ulcers in the past but is not sure if this is the same or not.  Review of Systems      Past Medical History  Diagnosis Date  . Hx of ovarian cyst     x 2-3  . Gestational hypertension 01/2012    also had postpartum preelampsia  . Plica of knee 08/2012    medial plica left knee  . Migraine   . Anxiety   . Panic attack   . Hx of varicella     Current Outpatient Prescriptions  Medication Sig Dispense Refill  . ALPRAZolam (XANAX) 0.25 MG tablet Take 1 tablet (0.25 mg total) by mouth 2 (two) times daily as needed for anxiety. 20 tablet 0  . clindamycin (CLINDAGEL) 1 % gel Apply topically 2 (two) times daily. 60 g 2  . ibuprofen (ADVIL,MOTRIN) 800 MG tablet take 1 tablet by mouth three times a day as needed  0  . PARoxetine (PAXIL) 20 MG tablet Take 1 tablet (20 mg total) by mouth daily. Please call for follow up in August. 30 tablet 3  . topiramate (TOPAMAX) 25 MG tablet Take 100 mg by mouth daily.      No current facility-administered medications for this visit.    Allergies  Allergen Reactions  . Cefuroxime Axetil Rash    Family History  Problem Relation Age of Onset  . Hypertension Mother   . Heart disease Mother     MI  . Anesthesia problems Mother     post-op N/V  . Asthma Sister   . Bipolar disorder Sister   .  Asthma Brother   . Diabetes Maternal Aunt   . Heart disease Father   . Bipolar disorder Father     Social History   Social History  . Marital Status: Married    Spouse Name: N/A  . Number of Children: N/A  . Years of Education: N/A   Occupational History  . Not on file.   Social History Main Topics  . Smoking status: Never Smoker   . Smokeless tobacco: Never Used  . Alcohol Use: No  . Drug Use: No  . Sexual Activity: Yes    Birth Control/ Protection: None     Comment: last night   Other Topics Concern  . Not on file   Social History Narrative     Constitutional: Denies fever, malaise, fatigue, headache or abrupt weight changes.  Respiratory: Denies difficulty breathing, shortness of breath, cough or sputum production.   Cardiovascular: Denies chest pain, chest tightness, palpitations or swelling in the hands or feet.  Gastrointestinal: Pt reports abdominal pain and nausea. Denies bloating, constipation, diarrhea or blood in the stool.  GU: Denies urgency, frequency, pain with urination, burning  sensation, blood in urine, odor or discharge.  No other specific complaints in a complete review of systems (except as listed in HPI above).  Objective:   Physical Exam  BP 118/82 mmHg  Pulse 80  Temp(Src) 98.8 F (37.1 C) (Oral)  Wt 183 lb (83.008 kg)  SpO2 99%  LMP 03/16/2016 Wt Readings from Last 3 Encounters:  04/12/16 183 lb (83.008 kg)  02/22/16 181 lb 12.8 oz (82.464 kg)  01/25/16 176 lb (79.833 kg)    General: Appears her stated age, obese in NAD.  Cardiovascular: Normal rate and rhythm. S1,S2 noted.  No murmur, rubs or gallops noted.  Pulmonary/Chest: Normal effort and positive vesicular breath sounds. No respiratory distress. No wheezes, rales or ronchi noted.  Abdomen: Soft and generally tender. Normal bowel sounds. No distention or masses noted.   BMET    Component Value Date/Time   NA 139 06/22/2015 1541   K 3.9 06/22/2015 1541   CL 108 06/22/2015  1541   CO2 25 06/22/2015 1541   GLUCOSE 91 06/22/2015 1541   BUN 15 06/22/2015 1541   CREATININE 0.70 06/22/2015 1541   CALCIUM 9.1 06/22/2015 1541   GFRNONAA >90 09/18/2014 0110   GFRAA >90 09/18/2014 0110    Lipid Panel     Component Value Date/Time   CHOL 174 04/04/2012 1410   TRIG 62.0 04/04/2012 1410   HDL 58.30 04/04/2012 1410   CHOLHDL 3 04/04/2012 1410   VLDL 12.4 04/04/2012 1410   LDLCALC 103* 04/04/2012 1410    CBC    Component Value Date/Time   WBC 6.9 06/22/2015 1541   WBC 8.4 11/17/2014 1441   RBC 4.01 06/22/2015 1541   RBC 4.05 11/17/2014 1441   HGB 12.6 06/22/2015 1541   HGB 12.7 11/17/2014 1441   HCT 37.1 06/22/2015 1541   HCT 38.5 11/17/2014 1441   PLT 215.0 06/22/2015 1541   PLT 206 11/17/2014 1441   MCV 92.6 06/22/2015 1541   MCV 95 11/17/2014 1441   MCH 31.4 11/17/2014 1441   MCH 32.8 09/19/2014 0605   MCHC 34.0 06/22/2015 1541   MCHC 33.0 11/17/2014 1441   RDW 13.4 06/22/2015 1541   RDW 12.7 11/17/2014 1441   LYMPHSABS 2.0 06/22/2015 1541   LYMPHSABS 1.5 11/17/2014 1441   MONOABS 0.5 06/22/2015 1541   MONOABS 0.9 11/17/2014 1441   EOSABS 0.1 06/22/2015 1541   EOSABS 0.1 11/17/2014 1441   BASOSABS 0.1 06/22/2015 1541   BASOSABS 0.0 11/17/2014 1441    Hgb A1C No results found for: HGBA1C       Assessment & Plan:   Generalized abdominal pain:  C/w GERD Reviewed CT scan results with her GI cocktail given in office Start Prilosec OTC 30 minutes before breakfast Tums as needed  RTC in 2 weeks if symptoms persist or worsen

## 2016-04-12 NOTE — Patient Instructions (Signed)
Food Choices for Gastroesophageal Reflux Disease, Adult When you have gastroesophageal reflux disease (GERD), the foods you eat and your eating habits are very important. Choosing the right foods can help ease the discomfort of GERD. WHAT GENERAL GUIDELINES DO I NEED TO FOLLOW?  Choose fruits, vegetables, whole grains, low-fat dairy products, and low-fat meat, fish, and poultry.  Limit fats such as oils, salad dressings, butter, nuts, and avocado.  Keep a food diary to identify foods that cause symptoms.  Avoid foods that cause reflux. These may be different for different people.  Eat frequent small meals instead of three large meals each day.  Eat your meals slowly, in a relaxed setting.  Limit fried foods.  Cook foods using methods other than frying.  Avoid drinking alcohol.  Avoid drinking large amounts of liquids with your meals.  Avoid bending over or lying down until 2-3 hours after eating. WHAT FOODS ARE NOT RECOMMENDED? The following are some foods and drinks that may worsen your symptoms: Vegetables Tomatoes. Tomato juice. Tomato and spaghetti sauce. Chili peppers. Onion and garlic. Horseradish. Fruits Oranges, grapefruit, and lemon (fruit and juice). Meats High-fat meats, fish, and poultry. This includes hot dogs, ribs, ham, sausage, salami, and bacon. Dairy Whole milk and chocolate milk. Sour cream. Cream. Butter. Ice cream. Cream cheese.  Beverages Coffee and tea, with or without caffeine. Carbonated beverages or energy drinks. Condiments Hot sauce. Barbecue sauce.  Sweets/Desserts Chocolate and cocoa. Donuts. Peppermint and spearmint. Fats and Oils High-fat foods, including French fries and potato chips. Other Vinegar. Strong spices, such as black pepper, white pepper, red pepper, cayenne, curry powder, cloves, ginger, and chili powder. The items listed above may not be a complete list of foods and beverages to avoid. Contact your dietitian for more  information.   This information is not intended to replace advice given to you by your health care provider. Make sure you discuss any questions you have with your health care provider.   Document Released: 11/21/2005 Document Revised: 12/12/2014 Document Reviewed: 09/25/2013 Elsevier Interactive Patient Education 2016 Else[employer] Inc.  

## 2016-04-12 NOTE — Progress Notes (Signed)
Pre visit review using our clinic review tool, if applicable. No additional management support is needed unless otherwise documented below in the visit note. 

## 2016-04-12 NOTE — Telephone Encounter (Signed)
Mel- Can you call and see what is going on?

## 2016-04-13 MED ORDER — GI COCKTAIL ~~LOC~~
30.0000 mL | Freq: Once | ORAL | Status: AC
  Administered 2016-04-13: 30 mL via ORAL

## 2016-04-13 NOTE — Addendum Note (Signed)
Addended by: Roena MaladyEVONTENNO, Teyah Rossy Y on: 04/13/2016 07:05 PM   Modules accepted: Orders

## 2016-04-20 DIAGNOSIS — H5213 Myopia, bilateral: Secondary | ICD-10-CM | POA: Diagnosis not present

## 2016-05-06 DIAGNOSIS — G43709 Chronic migraine without aura, not intractable, without status migrainosus: Secondary | ICD-10-CM | POA: Insufficient documentation

## 2016-05-06 DIAGNOSIS — G43009 Migraine without aura, not intractable, without status migrainosus: Secondary | ICD-10-CM | POA: Insufficient documentation

## 2016-05-12 DIAGNOSIS — F609 Personality disorder, unspecified: Secondary | ICD-10-CM | POA: Diagnosis not present

## 2016-07-13 ENCOUNTER — Encounter: Payer: Self-pay | Admitting: Internal Medicine

## 2016-07-14 ENCOUNTER — Ambulatory Visit: Payer: Self-pay | Admitting: Internal Medicine

## 2016-07-15 ENCOUNTER — Ambulatory Visit (INDEPENDENT_AMBULATORY_CARE_PROVIDER_SITE_OTHER): Payer: Managed Care, Other (non HMO) | Admitting: Internal Medicine

## 2016-07-15 ENCOUNTER — Encounter: Payer: Self-pay | Admitting: Internal Medicine

## 2016-07-15 VITALS — BP 122/80 | HR 90 | Temp 98.6°F | Wt 189.0 lb

## 2016-07-15 DIAGNOSIS — F419 Anxiety disorder, unspecified: Principal | ICD-10-CM

## 2016-07-15 DIAGNOSIS — F418 Other specified anxiety disorders: Secondary | ICD-10-CM

## 2016-07-15 DIAGNOSIS — F329 Major depressive disorder, single episode, unspecified: Secondary | ICD-10-CM

## 2016-07-15 MED ORDER — ALPRAZOLAM 0.5 MG PO TABS: 0.5000 mg | ORAL_TABLET | Freq: Every day | ORAL | 0 refills | Status: DC | PRN

## 2016-07-15 MED ORDER — PAROXETINE HCL 40 MG PO TABS: 40.0000 mg | ORAL_TABLET | ORAL | 2 refills | Status: DC

## 2016-07-15 NOTE — Progress Notes (Signed)
Subjective:    Patient ID: KARISTA AISPURO, female    DOB: 1996-02-04, 20 y.o.   MRN: [id number]  HPI  Pt presents to the clinic today to follow up anxiety and depression. This has been an ongoing issue for her. She continues to have marital issues with her husband and caring for her 2 toddlers all by herself. She feels like the Paxil is not effective as it once was. She would like a refill of the Xanax to take as needed. She felt like that helped some but not significantly and wants to know if the dose should be increased. She denies SI/HI. She is no longer seeing her therapist because she did not feel like they "meshed" well. Her husband is no longer seeing his therapist because he does not feel like he has an issue.   Review of Systems      Past Medical History:  Diagnosis Date  . Anxiety   . Gestational hypertension 01/2012   also had postpartum preelampsia  . Hx of ovarian cyst    x 2-3  . Hx of varicella   . Migraine   . Panic attack   . Plica of knee 08/2012   medial plica left knee    Current Outpatient Prescriptions  Medication Sig Dispense Refill  . ALPRAZolam (XANAX) 0.25 MG tablet Take 1 tablet (0.25 mg total) by mouth 2 (two) times daily as needed for anxiety. 20 tablet 0  . clindamycin (CLINDAGEL) 1 % gel Apply topically 2 (two) times daily. 60 g 2  . ibuprofen (ADVIL,MOTRIN) 800 MG tablet take 1 tablet by mouth three times a day as needed  0  . PARoxetine (PAXIL) 20 MG tablet Take 1 tablet (20 mg total) by mouth daily. Please call for follow up in August. 30 tablet 3  . topiramate (TOPAMAX) 25 MG tablet Take 100 mg by mouth daily.      No current facility-administered medications for this visit.     Allergies  Allergen Reactions  . Cefuroxime Axetil Rash    Family History  Problem Relation Age of Onset  . Hypertension Mother   . Heart disease Mother     MI  . Anesthesia problems Mother     post-op N/V  . Asthma Sister   . Bipolar disorder Sister   .  Asthma Brother   . Diabetes Maternal Aunt   . Heart disease Father   . Bipolar disorder Father     Social History   Social History  . Marital status: Married    Spouse name: N/A  . Number of children: N/A  . Years of education: N/A   Occupational History  . Not on file.   Social History Main Topics  . Smoking status: Never Smoker  . Smokeless tobacco: Never Used  . Alcohol use No  . Drug use: No  . Sexual activity: Yes    Birth control/ protection: None     Comment: last night   Other Topics Concern  . Not on file   Social History Narrative  . No narrative on file     Constitutional: Denies fever, malaise, fatigue, headache or abrupt weight changes.  Neurological: Denies dizziness, difficulty with memory, difficulty with speech or problems with balance and coordination.  Psych: Pt reports anxiety and depression. Denies SI/HI.  No other specific complaints in a complete review of systems (except as listed in HPI above).  Objective:   Physical Exam  BP 122/80   Pulse 90  Temp 98.6 F (37 C) (Oral)   Wt 189 lb (85.7 kg)   SpO2 98%   BMI 31.45 kg/m  Wt Readings from Last 3 Encounters:  07/15/16 189 lb (85.7 kg)  04/12/16 183 lb (83 kg)  02/22/16 181 lb 12.8 oz (82.5 kg)    General: Appears her stated age, well developed, well nourished in NAD. Cardiovascular: Normal rate and rhythm. S1,S2 noted.  No murmur, rubs or gallops noted. Pulmonary/Chest: Normal effort and positive vesicular breath sounds. No respiratory distress. No wheezes, rales or ronchi noted.  Neurological: Alert and oriented.  Psychiatric: She appears withdrawn. She is tearful at times.  BMET    Component Value Date/Time   NA 139 06/22/2015 1541   K 3.9 06/22/2015 1541   CL 108 06/22/2015 1541   CO2 25 06/22/2015 1541   GLUCOSE 91 06/22/2015 1541   BUN 15 06/22/2015 1541   CREATININE 0.70 06/22/2015 1541   CALCIUM 9.1 06/22/2015 1541   GFRNONAA >90 09/18/2014 0110   GFRAA >90  09/18/2014 0110    Lipid Panel     Component Value Date/Time   CHOL 174 04/04/2012 1410   TRIG 62.0 04/04/2012 1410   HDL 58.30 04/04/2012 1410   CHOLHDL 3 04/04/2012 1410   VLDL 12.4 04/04/2012 1410   LDLCALC 103 (H) 04/04/2012 1410    CBC    Component Value Date/Time   WBC 6.9 06/22/2015 1541   RBC 4.01 06/22/2015 1541   HGB 12.6 06/22/2015 1541   HGB 12.7 11/17/2014 1441   HCT 37.1 06/22/2015 1541   HCT 38.5 11/17/2014 1441   PLT 215.0 06/22/2015 1541   PLT 206 11/17/2014 1441   MCV 92.6 06/22/2015 1541   MCV 95 11/17/2014 1441   MCH 31.4 11/17/2014 1441   MCH 32.8 09/19/2014 0605   MCHC 34.0 06/22/2015 1541   RDW 13.4 06/22/2015 1541   RDW 12.7 11/17/2014 1441   LYMPHSABS 2.0 06/22/2015 1541   LYMPHSABS 1.5 11/17/2014 1441   MONOABS 0.5 06/22/2015 1541   MONOABS 0.9 11/17/2014 1441   EOSABS 0.1 06/22/2015 1541   EOSABS 0.1 11/17/2014 1441   BASOSABS 0.1 06/22/2015 1541   BASOSABS 0.0 11/17/2014 1441    Hgb A1C No results found for: HGBA1C          Assessment & Plan:   Anxiety and Depression:  Deteriorated Support offered today Increase Paxil to 40 mg daily Increase Xanax to 0.5 mg daiy Referral placed to [facility] for [facility]  Work note provided  Update me in 4 weeks and let me know how you are doing, sooner if worse Nicki ReaperBAITY, REGINA, NP

## 2016-07-15 NOTE — Patient Instructions (Signed)
Major Depressive Disorder Major depressive disorder is a mental illness. It also may be called clinical depression or unipolar depression. Major depressive disorder usually causes feelings of sadness, hopelessness, or helplessness. Some people with this disorder do not feel particularly sad but lose interest in doing things they used to enjoy (anhedonia). Major depressive disorder also can cause physical symptoms. It can interfere with work, school, relationships, and other normal everyday activities. The disorder varies in severity but is longer lasting and more serious than the sadness we all feel from time to time in our lives. Major depressive disorder often is triggered by stressful life events or major life changes. Examples of these triggers include divorce, loss of your job or home, a move, and the death of a family member or close friend. Sometimes this disorder occurs for no obvious reason at all. People who have family members with major depressive disorder or bipolar disorder are at higher risk for developing this disorder, with or without life stressors. Major depressive disorder can occur at any age. It may occur just once in your life (single episode major depressive disorder). It may occur multiple times (recurrent major depressive disorder). SYMPTOMS People with major depressive disorder have either anhedonia or depressed mood on nearly a daily basis for at least 2 weeks or longer. Symptoms of depressed mood include:  Feelings of sadness (blue or down in the dumps) or emptiness.  Feelings of hopelessness or helplessness.  Tearfulness or episodes of crying (may be observed by others).  Irritability (children and adolescents). In addition to depressed mood or anhedonia or both, people with this disorder have at least four of the following symptoms:  Difficulty sleeping or sleeping too much.   Significant change (increase or decrease) in appetite or weight.   Lack of energy or  motivation.  Feelings of guilt and worthlessness.   Difficulty concentrating, remembering, or making decisions.  Unusually slow movement (psychomotor retardation) or restlessness (as observed by others).   Recurrent wishes for death, recurrent thoughts of self-harm (suicide), or a suicide attempt. People with major depressive disorder commonly have persistent negative thoughts about themselves, other people, and the world. People with severe major depressive disorder may experiencedistorted beliefs or perceptions about the world (psychotic delusions). They also may see or hear things that are not real (psychotic hallucinations). DIAGNOSIS Major depressive disorder is diagnosed through an assessment by your health care provider. Your health care provider will ask aboutaspects of your daily life, such as mood,sleep, and appetite, to see if you have the diagnostic symptoms of major depressive disorder. Your health care provider may ask about your medical history and use of alcohol or drugs, including prescription medicines. Your health care provider also may do a physical exam and blood work. This is because certain medical conditions and the use of certain substances can cause major depressive disorder-like symptoms (secondary depression). Your health care provider also may refer you to a mental health specialist for further evaluation and treatment. TREATMENT It is important to recognize the symptoms of major depressive disorder and seek treatment. The following treatments can be prescribed for this disorder:   Medicine. Antidepressant medicines usually are prescribed. Antidepressant medicines are thought to correct chemical imbalances in the brain that are commonly associated with major depressive disorder. Other types of medicine may be added if the symptoms do not respond to antidepressant medicines alone or if psychotic delusions or hallucinations occur.  Talk therapy. Talk therapy can be  helpful in treating major depressive disorder by providing   support, education, and guidance. Certain types of talk therapy also can help with negative thinking (cognitive behavioral therapy) and with relationship issues that trigger this disorder (interpersonal therapy). A mental health specialist can help determine which treatment is best for you. Most people with major depressive disorder do well with a combination of medicine and talk therapy. Treatments involving electrical stimulation of the brain can be used in situations with extremely severe symptoms or when medicine and talk therapy do not work over time. These treatments include electroconvulsive therapy, transcranial magnetic stimulation, and vagal nerve stimulation.   This information is not intended to replace advice given to you by your health care provider. Make sure you discuss any questions you have with your health care provider.   Document Released: 03/18/2013 Document Revised: 12/12/2014 Document Reviewed: 03/18/2013 Elsevier Interactive Patient Education 2016 [employer]  

## 2016-09-01 ENCOUNTER — Ambulatory Visit: Payer: Self-pay | Admitting: Psychology

## 2016-10-21 ENCOUNTER — Emergency Department
Admission: EM | Admit: 2016-10-21 | Discharge: 2016-10-21 | Discharge status: Against Medical Advice | Payer: Managed Care, Other (non HMO)

## 2016-10-25 ENCOUNTER — Other Ambulatory Visit: Payer: Self-pay | Admitting: Internal Medicine

## 2016-10-25 ENCOUNTER — Encounter: Payer: Self-pay | Admitting: Internal Medicine

## 2016-10-25 DIAGNOSIS — F419 Anxiety disorder, unspecified: Principal | ICD-10-CM

## 2016-10-25 DIAGNOSIS — F329 Major depressive disorder, single episode, unspecified: Secondary | ICD-10-CM

## 2016-10-25 DIAGNOSIS — F32A Depression, unspecified: Secondary | ICD-10-CM

## 2016-10-25 MED ORDER — ALPRAZOLAM 0.5 MG PO TABS: 0.5000 mg | ORAL_TABLET | Freq: Every day | ORAL | 0 refills | Status: DC | PRN

## 2016-10-25 MED ORDER — PAROXETINE HCL 40 MG PO TABS: 40.0000 mg | ORAL_TABLET | ORAL | 2 refills | Status: DC

## 2016-10-25 NOTE — Telephone Encounter (Signed)
Ok to phone in Xanax 

## 2016-10-25 NOTE — Telephone Encounter (Signed)
Last filled 07/15/16--please advise

## 2016-10-25 NOTE — Telephone Encounter (Signed)
Rx called in to pharmacy. 

## 2016-10-26 ENCOUNTER — Other Ambulatory Visit: Payer: Self-pay | Admitting: Neurology

## 2016-10-26 DIAGNOSIS — G43119 Migraine with aura, intractable, without status migrainosus: Secondary | ICD-10-CM

## 2016-11-10 ENCOUNTER — Ambulatory Visit: Payer: Managed Care, Other (non HMO)

## 2016-12-02 ENCOUNTER — Encounter: Payer: Self-pay | Admitting: Internal Medicine

## 2016-12-02 ENCOUNTER — Encounter: Payer: Managed Care, Other (non HMO) | Admitting: Internal Medicine

## 2016-12-02 DIAGNOSIS — Z0289 Encounter for other administrative examinations: Secondary | ICD-10-CM

## 2016-12-26 ENCOUNTER — Other Ambulatory Visit: Payer: Self-pay | Admitting: Internal Medicine

## 2016-12-26 ENCOUNTER — Encounter: Payer: Self-pay | Admitting: Internal Medicine

## 2016-12-26 MED ORDER — PAROXETINE HCL 20 MG PO TABS: 20.0000 mg | ORAL_TABLET | Freq: Every day | ORAL | 2 refills | Status: DC

## 2016-12-26 MED ORDER — BUPROPION HCL ER (XL) 150 MG PO TB24: 150.0000 mg | ORAL_TABLET | Freq: Every day | ORAL | 2 refills | Status: DC

## 2017-02-06 ENCOUNTER — Encounter: Payer: Self-pay | Admitting: Internal Medicine

## 2017-02-14 ENCOUNTER — Other Ambulatory Visit: Payer: Self-pay | Admitting: Otolaryngology

## 2017-02-14 DIAGNOSIS — R0609 Other forms of dyspnea: Secondary | ICD-10-CM

## 2017-02-14 DIAGNOSIS — M542 Cervicalgia: Secondary | ICD-10-CM

## 2017-03-07 ENCOUNTER — Ambulatory Visit
Admission: RE | Admit: 2017-03-07 | Discharge: 2017-03-07 | Disposition: A | Discharge status: Home / Self Care | Payer: 59 | Source: Ambulatory Visit | Attending: Otolaryngology | Admitting: Otolaryngology

## 2017-03-07 DIAGNOSIS — M542 Cervicalgia: Secondary | ICD-10-CM

## 2017-03-07 DIAGNOSIS — R0609 Other forms of dyspnea: Secondary | ICD-10-CM

## 2017-03-07 MED ORDER — IOPAMIDOL (ISOVUE-300) INJECTION 61%
75.0000 mL | Freq: Once | INTRAVENOUS | Status: AC | PRN
  Administered 2017-03-07: 75 mL via INTRAVENOUS

## 2017-03-16 ENCOUNTER — Other Ambulatory Visit: Payer: Self-pay | Admitting: Otolaryngology

## 2017-03-16 DIAGNOSIS — R1314 Dysphagia, pharyngoesophageal phase: Secondary | ICD-10-CM

## 2017-03-21 ENCOUNTER — Ambulatory Visit (INDEPENDENT_AMBULATORY_CARE_PROVIDER_SITE_OTHER): Payer: 59 | Admitting: Internal Medicine

## 2017-03-21 ENCOUNTER — Encounter: Payer: Self-pay | Admitting: Internal Medicine

## 2017-03-21 VITALS — BP 118/80 | HR 82 | Temp 98.1°F | Wt 205.0 lb

## 2017-03-21 DIAGNOSIS — F419 Anxiety disorder, unspecified: Secondary | ICD-10-CM | POA: Diagnosis not present

## 2017-03-21 DIAGNOSIS — R635 Abnormal weight gain: Secondary | ICD-10-CM | POA: Diagnosis not present

## 2017-03-21 DIAGNOSIS — F329 Major depressive disorder, single episode, unspecified: Secondary | ICD-10-CM

## 2017-03-21 MED ORDER — ALPRAZOLAM 0.5 MG PO TABS: 0.5000 mg | ORAL_TABLET | Freq: Every day | ORAL | 0 refills | Status: DC | PRN

## 2017-03-21 MED ORDER — PAROXETINE HCL 10 MG PO TABS: 10.0000 mg | ORAL_TABLET | Freq: Every day | ORAL | 2 refills | Status: DC

## 2017-03-21 NOTE — Progress Notes (Signed)
Subjective:    Patient ID: [name] Sanchez, female    DOB: 22-Jan-1996, 21 y.o.   MRN: [id number]  HPI  Pt presents to the clinic to clinic today to follow up anxiety and depression. She feels like the Paxil works well for her mood, but she does not like the weight gain. She reports she has gained 30 lbs in the last 4 months on the Paxil, despite diet and exercise. She does not feel like the Wellbutrin is working at all, and would like to stop it if at all possible. She is still under a lot of stress in her marriage, but she has a good support group. She has a new job at [employer] that she absolutely loves. She has a good relationship with her kids.  She is also interested in some form of diet pill. She reports the weight gain is adding to her anxiety and depression. She does not know what she can take, but would like something to help kick start her weight loss.  Review of Systems  Past Medical History:  Diagnosis Date  . Anxiety   . Gestational hypertension 01/2012   also had postpartum preelampsia  . Hx of ovarian cyst    x 2-3  . Hx of varicella   . Migraine   . Panic attack   . Plica of knee 08/2012   medial plica left knee    Current Outpatient Prescriptions  Medication Sig Dispense Refill  . ALPRAZolam (XANAX) 0.5 MG tablet Take 1 tablet (0.5 mg total) by mouth daily as needed for anxiety. 20 tablet 0  . buPROPion (WELLBUTRIN XL) 150 MG 24 hr tablet Take 1 tablet (150 mg total) by mouth daily. 30 tablet 2  . ibuprofen (ADVIL,MOTRIN) 800 MG tablet take 1 tablet by mouth three times a day as needed  0  . topiramate (TOPAMAX) 25 MG tablet Take 100 mg by mouth daily.      No current facility-administered medications for this visit.     Allergies  Allergen Reactions  . Cefuroxime Axetil Rash    Family History  Problem Relation Age of Onset  . Hypertension Mother   . Heart disease Mother     MI  . Anesthesia problems Mother     post-op N/V  . Asthma Sister   .  Bipolar disorder Sister   . Asthma Brother   . Diabetes Maternal Aunt   . Heart disease Father   . Bipolar disorder Father     Social History   Social History  . Marital status: Married    Spouse name: N/A  . Number of children: N/A  . Years of education: N/A   Occupational History  . Not on file.   Social History Main Topics  . Smoking status: Never Smoker  . Smokeless tobacco: Never Used  . Alcohol use No  . Drug use: No  . Sexual activity: Yes    Birth control/ protection: None     Comment: last night   Other Topics Concern  . Not on file   Social History Narrative  . No narrative on file     Constitutional: Pt reports weight gain. Denies fever, malaise, fatigue, headache.  Psych: Pt reports anxiety and depression. Denies SI/HI.  No other specific complaints in a complete review of systems (except as listed in HPI above).     Objective:   Physical Exam   BP 118/80   Pulse 82   Temp 98.1 F (36.7 C) (  Oral)   Wt 205 lb (93 kg)   LMP 03/17/2017   SpO2 98%   BMI 34.11 kg/m  Wt Readings from Last 3 Encounters:  03/21/17 205 lb (93 kg)  07/15/16 189 lb (85.7 kg)  04/12/16 183 lb (83 kg)    General: Appears her stated age, obese in NAD. Psychiatric: Mood and affect normal. Behavior is normal. Judgment and thought content normal.     BMET    Component Value Date/Time   NA 139 06/22/2015 1541   K 3.9 06/22/2015 1541   CL 108 06/22/2015 1541   CO2 25 06/22/2015 1541   GLUCOSE 91 06/22/2015 1541   BUN 15 06/22/2015 1541   CREATININE 0.70 06/22/2015 1541   CALCIUM 9.1 06/22/2015 1541   GFRNONAA >90 09/18/2014 0110   GFRAA >90 09/18/2014 0110    Lipid Panel     Component Value Date/Time   CHOL 174 04/04/2012 1410   TRIG 62.0 04/04/2012 1410   HDL 58.30 04/04/2012 1410   CHOLHDL 3 04/04/2012 1410   VLDL 12.4 04/04/2012 1410   LDLCALC 103 (H) 04/04/2012 1410    CBC    Component Value Date/Time   WBC 6.9 06/22/2015 1541   RBC 4.01  06/22/2015 1541   HGB 12.6 06/22/2015 1541   HGB 12.7 11/17/2014 1441   HCT 37.1 06/22/2015 1541   HCT 38.5 11/17/2014 1441   PLT 215.0 06/22/2015 1541   PLT 206 11/17/2014 1441   MCV 92.6 06/22/2015 1541   MCV 95 11/17/2014 1441   MCH 31.4 11/17/2014 1441   MCH 32.8 09/19/2014 0605   MCHC 34.0 06/22/2015 1541   RDW 13.4 06/22/2015 1541   RDW 12.7 11/17/2014 1441   LYMPHSABS 2.0 06/22/2015 1541   LYMPHSABS 1.5 11/17/2014 1441   MONOABS 0.5 06/22/2015 1541   MONOABS 0.9 11/17/2014 1441   EOSABS 0.1 06/22/2015 1541   EOSABS 0.1 11/17/2014 1441   BASOSABS 0.1 06/22/2015 1541   BASOSABS 0.0 11/17/2014 1441    Hgb A1C No results found for: HGBA1C         Assessment & Plan:   Abnormal Weight Gain:  Referral placed to [facility]  Anxiety and Depression:  Decrease Paxil to 10 mg tabs, refilled today Stop Wellbutrin Xanax refilled today  Make an appt for your annual exam Nicki Reaper, NP

## 2017-03-22 NOTE — Patient Instructions (Signed)
Generalized Anxiety Disorder, Adult Generalized anxiety disorder (GAD) is a mental health disorder. People with this condition constantly worry about everyday events. Unlike normal anxiety, worry related to GAD is not triggered by a specific event. These worries also do not fade or get better with time. GAD interferes with life functions, including relationships, work, and school. GAD can vary from mild to severe. People with severe GAD can have intense waves of anxiety with physical symptoms (panic attacks). What are the causes? The exact cause of GAD is not known. What increases the risk? This condition is more likely to develop in:  Women.  People who have a family history of anxiety disorders.  People who are very shy.  People who experience very stressful life events, such as the death of a loved one.  People who have a very stressful family environment. What are the signs or symptoms? People with GAD often worry excessively about many things in their lives, such as their health and family. They may also be overly concerned about:  Doing well at work.  Being on time.  Natural disasters.  Friendships. Physical symptoms of GAD include:  Fatigue.  Muscle tension or having muscle twitches.  Trembling or feeling shaky.  Being easily startled.  Feeling like your heart is pounding or racing.  Feeling out of breath or like you cannot take a deep breath.  Having trouble falling asleep or staying asleep.  Sweating.  Nausea, diarrhea, or irritable bowel syndrome (IBS).  Headaches.  Trouble concentrating or remembering facts.  Restlessness.  Irritability. How is this diagnosed? Your health care provider can diagnose GAD based on your symptoms and medical history. You will also have a physical exam. The health care provider will ask specific questions about your symptoms, including how severe they are, when they started, and if they come and go. Your health care  provider may ask you about your use of alcohol or drugs, including prescription medicines. Your health care provider may refer you to a mental health specialist for further evaluation. Your health care provider will do a thorough examination and may perform additional tests to rule out other possible causes of your symptoms. To be diagnosed with GAD, a person must have anxiety that:  Is out of his or her control.  Affects several different aspects of his or her life, such as work and relationships.  Causes distress that makes him or her unable to take part in normal activities.  Includes at least three physical symptoms of GAD, such as restlessness, fatigue, trouble concentrating, irritability, muscle tension, or sleep problems. Before your health care provider can confirm a diagnosis of GAD, these symptoms must be present more days than they are not, and they must last for six months or longer. How is this treated? The following therapies are usually used to treat GAD:  Medicine. Antidepressant medicine is usually prescribed for long-term daily control. Antianxiety medicines may be added in severe cases, especially when panic attacks occur.  Talk therapy (psychotherapy). Certain types of talk therapy can be helpful in treating GAD by providing support, education, and guidance. Options include:  Cognitive behavioral therapy (CBT). People learn coping skills and techniques to ease their anxiety. They learn to identify unrealistic or negative thoughts and behaviors and to replace them with positive ones.  Acceptance and commitment therapy (ACT). This treatment teaches people how to be mindful as a way to cope with unwanted thoughts and feelings.  Biofeedback. This process trains you to manage your body's response (  physiological response) through breathing techniques and relaxation methods. You will work with a therapist while machines are used to monitor your physical symptoms.  Stress  management techniques. These include yoga, meditation, and exercise. A mental health specialist can help determine which treatment is best for you. Some people see improvement with one type of therapy. However, other people require a combination of therapies. Follow these instructions at home:  Take over-the-counter and prescription medicines only as told by your health care provider.  Try to maintain a normal routine.  Try to anticipate stressful situations and allow extra time to manage them.  Practice any stress management or self-calming techniques as taught by your health care provider.  Do not punish yourself for setbacks or for not making progress.  Try to recognize your accomplishments, even if they are small.  Keep all follow-up visits as told by your health care provider. This is important. Contact a health care provider if:  Your symptoms do not get better.  Your symptoms get worse.  You have signs of depression, such as:  A persistently sad, cranky, or irritable mood.  Loss of enjoyment in activities that used to bring you joy.  Change in weight or eating.  Changes in sleeping habits.  Avoiding friends or family members.  Loss of energy for normal tasks.  Feelings of guilt or worthlessness. Get help right away if:  You have serious thoughts about hurting yourself or others. If you ever feel like you may hurt yourself or others, or have thoughts about taking your own life, get help right away. You can go to your nearest emergency department or call:  Your local emergency services (911 in the [place]  A suicide crisis helpline, such as the National Suicide Prevention Lifeline at 1-800-273-8255. This is open 24 hours a day. Summary  Generalized anxiety disorder (GAD) is a mental health disorder that involves worry that is not triggered by a specific event.  People with GAD often worry excessively about many things in their lives, such as their health and  family.  GAD may cause physical symptoms such as restlessness, trouble concentrating, sleep problems, frequent sweating, nausea, diarrhea, headaches, and trembling or muscle twitching.  A mental health specialist can help determine which treatment is best for you. Some people see improvement with one type of therapy. However, other people require a combination of therapies. This information is not intended to replace advice given to you by your health care provider. Make sure you discuss any questions you have with your health care provider. Document Released: 03/18/2013 Document Revised: 10/11/2016 Document Reviewed: 10/11/2016 Elsevier Interactive Patient Education  2017 [employer]vier Inc.  

## 2017-04-17 ENCOUNTER — Ambulatory Visit: Payer: 59 | Admitting: Internal Medicine

## 2017-04-18 ENCOUNTER — Encounter: Payer: Self-pay | Admitting: Internal Medicine

## 2017-04-18 ENCOUNTER — Ambulatory Visit (INDEPENDENT_AMBULATORY_CARE_PROVIDER_SITE_OTHER): Payer: 59 | Admitting: Internal Medicine

## 2017-04-18 VITALS — BP 116/78 | HR 76 | Temp 98.0°F | Wt 204.8 lb

## 2017-04-18 DIAGNOSIS — F329 Major depressive disorder, single episode, unspecified: Secondary | ICD-10-CM

## 2017-04-18 DIAGNOSIS — F32A Depression, unspecified: Secondary | ICD-10-CM

## 2017-04-18 DIAGNOSIS — R2232 Localized swelling, mass and lump, left upper limb: Secondary | ICD-10-CM | POA: Diagnosis not present

## 2017-04-18 DIAGNOSIS — F419 Anxiety disorder, unspecified: Secondary | ICD-10-CM | POA: Diagnosis not present

## 2017-04-18 MED ORDER — PAROXETINE HCL 20 MG PO TABS: 20.0000 mg | ORAL_TABLET | Freq: Every day | ORAL | 3 refills | Status: DC

## 2017-04-18 NOTE — Progress Notes (Signed)
Subjective:    Patient ID: [name] Sanchez, female    DOB: 04-17-1996, 21 y.o.   MRN: [id number]  HPI  Pt presents to the clinic today with c/o a mass on her left arm. She noticed it 2 months ago. Since that time, she has noticed that it has increased in size. It is tender to touch. She has not tried anything OTC for this.  She also wants to increase her Paxil to 20 mg daily. She reports decreasing the dose to 10 mg daily is making her anxiety much worse.  Review of Systems      Past Medical History:  Diagnosis Date  . Anxiety   . Gestational hypertension 01/2012   also had postpartum preelampsia  . Hx of ovarian cyst    x 2-3  . Hx of varicella   . Migraine   . Panic attack   . Plica of knee 08/2012   medial plica left knee    Current Outpatient Prescriptions  Medication Sig Dispense Refill  . ALPRAZolam (XANAX) 0.5 MG tablet Take 1 tablet (0.5 mg total) by mouth daily as needed for anxiety. 20 tablet 0  . ibuprofen (ADVIL,MOTRIN) 800 MG tablet take 1 tablet by mouth three times a day as needed  0  . PARoxetine (PAXIL) 10 MG tablet Take 1 tablet (10 mg total) by mouth daily. 30 tablet 2  . topiramate (TOPAMAX) 25 MG tablet Take 100 mg by mouth daily.      No current facility-administered medications for this visit.     Allergies  Allergen Reactions  . Cefuroxime Axetil Rash    Family History  Problem Relation Age of Onset  . Hypertension Mother   . Heart disease Mother        MI  . Anesthesia problems Mother        post-op N/V  . Asthma Sister   . Bipolar disorder Sister   . Asthma Brother   . Diabetes Maternal Aunt   . Heart disease Father   . Bipolar disorder Father     Social History   Social History  . Marital status: Married    Spouse name: N/A  . Number of children: N/A  . Years of education: N/A   Occupational History  . Not on file.   Social History Main Topics  . Smoking status: Never Smoker  . Smokeless tobacco: Never Used  . Alcohol  use No  . Drug use: No  . Sexual activity: Yes    Birth control/ protection: None     Comment: last night   Other Topics Concern  . Not on file   Social History Narrative  . No narrative on file     Constitutional: Denies fever, malaise, fatigue, headache or abrupt weight changes.  Skin: Pt reports mass of left arm. Denies redness, rashes, or ulcercations.  Psych: Pt reports anxiety and depression. Denies SI/HI.   No other specific complaints in a complete review of systems (except as listed in HPI above).  Objective:   Physical Exam  BP 116/78   Pulse 76   Temp 98 F (36.7 C) (Oral)   Wt 204 lb 12.8 oz (92.9 kg)   LMP 04/17/2017   SpO2 99%   BMI 34.08 kg/m  Wt Readings from Last 3 Encounters:  04/18/17 204 lb 12.8 oz (92.9 kg)  03/21/17 205 lb (93 kg)  07/15/16 189 lb (85.7 kg)    General: Appears her stated age, in NAD. Skin: Mass noted  on left forearm, just below antecubital space. Neurological: Alert and oriented.  Psychiatric: Mood and affect normal. Behavior is normal. Judgment and thought content normal.    BMET    Component Value Date/Time   NA 139 06/22/2015 1541   K 3.9 06/22/2015 1541   CL 108 06/22/2015 1541   CO2 25 06/22/2015 1541   GLUCOSE 91 06/22/2015 1541   BUN 15 06/22/2015 1541   CREATININE 0.70 06/22/2015 1541   CALCIUM 9.1 06/22/2015 1541   GFRNONAA >90 09/18/2014 0110   GFRAA >90 09/18/2014 0110    Lipid Panel     Component Value Date/Time   CHOL 174 04/04/2012 1410   TRIG 62.0 04/04/2012 1410   HDL 58.30 04/04/2012 1410   CHOLHDL 3 04/04/2012 1410   VLDL 12.4 04/04/2012 1410   LDLCALC 103 (H) 04/04/2012 1410    CBC    Component Value Date/Time   WBC 6.9 06/22/2015 1541   RBC 4.01 06/22/2015 1541   HGB 12.6 06/22/2015 1541   HGB 12.7 11/17/2014 1441   HCT 37.1 06/22/2015 1541   HCT 38.5 11/17/2014 1441   PLT 215.0 06/22/2015 1541   PLT 206 11/17/2014 1441   MCV 92.6 06/22/2015 1541   MCV 95 11/17/2014 1441    MCH 31.4 11/17/2014 1441   MCH 32.8 09/19/2014 0605   MCHC 34.0 06/22/2015 1541   RDW 13.4 06/22/2015 1541   RDW 12.7 11/17/2014 1441   LYMPHSABS 2.0 06/22/2015 1541   LYMPHSABS 1.5 11/17/2014 1441   MONOABS 0.5 06/22/2015 1541   MONOABS 0.9 11/17/2014 1441   EOSABS 0.1 06/22/2015 1541   EOSABS 0.1 11/17/2014 1441   BASOSABS 0.1 06/22/2015 1541   BASOSABS 0.0 11/17/2014 1441    Hgb A1C No results found for: HGBA1C          Assessment & Plan:   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

## 2017-04-19 ENCOUNTER — Encounter: Payer: Self-pay | Admitting: Internal Medicine

## 2017-04-19 ENCOUNTER — Ambulatory Visit (HOSPITAL_COMMUNITY)
Admission: RE | Admit: 2017-04-19 | Discharge: 2017-04-19 | Disposition: A | Discharge status: Home / Self Care | Payer: 59 | Source: Ambulatory Visit | Attending: Internal Medicine | Admitting: Internal Medicine

## 2017-04-19 DIAGNOSIS — R2232 Localized swelling, mass and lump, left upper limb: Secondary | ICD-10-CM | POA: Insufficient documentation

## 2017-04-19 NOTE — Patient Instructions (Signed)
Lipoma A lipoma is a noncancerous (benign) tumor that is made up of fat cells. This is a very common type of soft-tissue growth. Lipomas are usually found under the skin (subcutaneous). They may occur in any tissue of the body that contains fat. Common areas for lipomas to appear include the back, shoulders, buttocks, and thighs. Lipomas grow slowly, and they are usually painless. Most lipomas do not cause problems and do not require treatment. What are the causes? The cause of this condition is not known. What increases the risk? This condition is more likely to develop in:  People who are 40-60 years old.  People who have a family history of lipomas. What are the signs or symptoms? A lipoma usually appears as a small, round bump under the skin. It may feel soft or rubbery, but the firmness can vary. Most lipomas are not painful. However, a lipoma may become painful if it is located in an area where it pushes on nerves. How is this diagnosed? A lipoma can usually be diagnosed with a physical exam. You may also have tests to confirm the diagnosis and to rule out other conditions. Tests may include:  Imaging tests, such as a CT scan or MRI.  Removal of a tissue sample to be looked at under a microscope (biopsy). How is this treated? Treatment is not needed for small lipomas that are not causing problems. If a lipoma continues to get bigger or it causes problems, removal is often the best option. Lipomas can also be removed to improve appearance. Removal of a lipoma is usually done with a surgery in which the fatty cells and the surrounding capsule are removed. Most often, a medicine that numbs the area (local anesthetic) is used for this procedure. Follow these instructions at home:  Keep all follow-up visits as directed by your health care provider. This is important. Contact a health care provider if:  Your lipoma becomes larger or hard.  Your lipoma becomes painful, red, or increasingly  swollen. These could be signs of infection or a more serious condition. This information is not intended to replace advice given to you by your health care provider. Make sure you discuss any questions you have with your health care provider. Document Released: 11/11/2002 Document Revised: 04/28/2016 Document Reviewed: 11/17/2014 Elsevier Interactive Patient Education  2017 Elsevier Inc.  

## 2017-04-20 ENCOUNTER — Telehealth: Payer: Self-pay | Admitting: Internal Medicine

## 2017-04-20 ENCOUNTER — Encounter (INDEPENDENT_AMBULATORY_CARE_PROVIDER_SITE_OTHER): Payer: Self-pay | Admitting: Family Medicine

## 2017-04-20 ENCOUNTER — Other Ambulatory Visit: Payer: Self-pay | Admitting: Internal Medicine

## 2017-04-20 MED ORDER — PREDNISONE 10 MG PO TABS: ORAL_TABLET | ORAL | 0 refills | Status: DC

## 2017-04-20 NOTE — Telephone Encounter (Signed)
Pt called stating it's okay for her to be prescribed prednisone.

## 2017-04-22 ENCOUNTER — Emergency Department (HOSPITAL_COMMUNITY)
Admission: EM | Admit: 2017-04-22 | Discharge: 2017-04-23 | Disposition: A | Discharge status: Home / Self Care | Payer: 59 | Attending: Emergency Medicine | Admitting: Emergency Medicine

## 2017-04-22 ENCOUNTER — Encounter (HOSPITAL_COMMUNITY): Payer: Self-pay

## 2017-04-22 DIAGNOSIS — G43909 Migraine, unspecified, not intractable, without status migrainosus: Secondary | ICD-10-CM | POA: Insufficient documentation

## 2017-04-22 DIAGNOSIS — Z79899 Other long term (current) drug therapy: Secondary | ICD-10-CM | POA: Insufficient documentation

## 2017-04-22 NOTE — ED Triage Notes (Signed)
Reports migraine headache since last pm [facility] with no relief. sts left side prominent.

## 2017-04-23 MED ORDER — METOCLOPRAMIDE HCL 10 MG PO TABS
10.0000 mg | ORAL_TABLET | Freq: Once | ORAL | Status: AC
  Administered 2017-04-23: 10 mg via ORAL
  Filled 2017-04-23: qty 1

## 2017-04-23 MED ORDER — KETOROLAC TROMETHAMINE 30 MG/ML IJ SOLN
30.0000 mg | Freq: Once | INTRAMUSCULAR | Status: AC
  Administered 2017-04-23: 30 mg via INTRAVENOUS
  Filled 2017-04-23: qty 1

## 2017-04-23 MED ORDER — DIPHENHYDRAMINE HCL 25 MG PO CAPS
25.0000 mg | ORAL_CAPSULE | Freq: Once | ORAL | Status: AC
  Administered 2017-04-23: 25 mg via ORAL
  Filled 2017-04-23: qty 1

## 2017-04-23 MED ORDER — SODIUM CHLORIDE 0.9 % IV BOLUS (SEPSIS)
1000.0000 mL | Freq: Once | INTRAVENOUS | Status: AC
  Administered 2017-04-23: 1000 mL via INTRAVENOUS

## 2017-04-23 NOTE — ED Provider Notes (Signed)
[facility]-EMERGENCY DEPT Provider Note   CSN: [id number] Arrival date & time: 04/22/17  2055   History   Chief Complaint Chief Complaint  Patient presents with  . Migraine    HPI [name] Sanchez is a 21 y.o. female.  HPI   21 year old female presents today with complaints of migraine. Patient reports she was diagnosed with migraines when she was a young child. She notes symptoms have worsened over the years. She reports that this migraine started around 7 PM last night with slow onset left-sided. She notes this is the typical pattern of her migraines. She notes light sensitivity, sound sensitivity and some nausea. She reports some weakness and bilateral arms denies any numbness tingling or any other neurological deficits. Patient reports that she does have some lightheadedness associated with this migraine. She reports she's been nauseous and not wanted any food or drink recently. She denies any fever, neck stiffness, or any other red flags.   Past Medical History:  Diagnosis Date  . Anxiety   . Gestational hypertension 01/2012   also had postpartum preelampsia  . Hx of ovarian cyst    x 2-3  . Hx of varicella   . Migraine   . Panic attack   . Plica of knee 08/2012   medial plica left knee    Patient Active Problem List   Diagnosis Date Noted  . Anxiety and depression 04/04/2012  . Migraine     Past Surgical History:  Procedure Laterality Date  . KNEE ARTHROSCOPY  08/17/2012   Procedure: ARTHROSCOPY KNEE;  Surgeon: Harvie Junior, MD;  Location:  [facility];  Service: Orthopedics;  Laterality: Left;  left knee scope with tight lateral retinacular release and plica removal  . TYMPANOSTOMY TUBE PLACEMENT      OB History    Gravida Para Term Preterm AB Living   3 2 2   1 2    SAB TAB Ectopic Multiple Live Births     1     2       Home Medications    Prior to Admission medications   Medication Sig Start Date End Date Taking? Authorizing Provider    PARoxetine (PAXIL) 20 MG tablet Take 1 tablet (20 mg total) by mouth daily. 04/18/17  Yes Baity, Salvadore Oxford, NP  ALPRAZolam Prudy Feeler) 0.5 MG tablet Take 1 tablet (0.5 mg total) by mouth daily as needed for anxiety. Patient not taking: Reported on 04/23/2017 03/21/17   Lorre Munroe, NP  predniSONE (DELTASONE) 10 MG tablet Take 3 tabs on days 1-3, take 2 tabs on days 4-6, take 1 tab on days 7-9 Patient not taking: Reported on 04/23/2017 04/20/17   Lorre Munroe, NP    Family History Family History  Problem Relation Age of Onset  . Hypertension Mother   . Heart disease Mother        MI  . Anesthesia problems Mother        post-op N/V  . Asthma Sister   . Bipolar disorder Sister   . Asthma Brother   . Diabetes Maternal Aunt   . Heart disease Father   . Bipolar disorder Father     Social History Social History  Substance Use Topics  . Smoking status: Never Smoker  . Smokeless tobacco: Never Used  . Alcohol use No     Allergies   Cefuroxime axetil   Review of Systems Review of Systems  All other systems reviewed and are negative.   Physical Exam Updated  Vital Signs BP 110/63 (BP Location: Right Arm)   Pulse 68   Temp 98 F (36.7 C) (Oral)   Resp 16   Ht 5\' 4"  (1.626 m)   Wt 200 lb (90.7 kg)   LMP 04/17/2017   SpO2 97%   BMI 34.33 kg/m   Physical Exam  Constitutional: She is oriented to person, place, and time. She appears well-developed and well-nourished. No distress.  HENT:  Head: Normocephalic.  Eyes: Conjunctivae and EOM are normal. Pupils are equal, round, and reactive to light. Right eye exhibits no discharge. Left eye exhibits no discharge. No scleral icterus.  Neck: Normal range of motion. Neck supple. No JVD present.  Pulmonary/Chest: No stridor.  Musculoskeletal: Normal range of motion. She exhibits no edema or tenderness.  Lymphadenopathy:    She has no cervical adenopathy.  Neurological: She is alert and oriented to person, place, and time. She has  normal strength. She displays no atrophy and no tremor. No cranial nerve deficit or sensory deficit. She exhibits normal muscle tone. She displays a negative Romberg sign. She displays no seizure activity. Coordination and gait normal. GCS eye subscore is 4. GCS verbal subscore is 5. GCS motor subscore is 6.  Reflex Scores:      Patellar reflexes are 2+ on the right side and 2+ on the left side. Skin: She is not diaphoretic.  Nursing note and vitals reviewed.    ED Treatments / Results  Labs (all labs ordered are listed, but only abnormal results are displayed) Labs Reviewed - No data to display  EKG  EKG Interpretation None       Radiology No results found.  Procedures Procedures (including critical care time)  Medications Ordered in ED Medications  sodium chloride 0.9 % bolus 1,000 mL (0 mLs Intravenous Stopped 04/23/17 0226)  ketorolac (TORADOL) 30 MG/ML injection 30 mg (30 mg Intravenous Given 04/23/17 0115)  metoCLOPramide (REGLAN) tablet 10 mg (10 mg Oral Given 04/23/17 0115)  diphenhydrAMINE (BENADRYL) capsule 25 mg (25 mg Oral Given 04/23/17 0115)     Initial Impression / Assessment and Plan / ED Course  I have reviewed the triage vital signs and the nursing notes.  Pertinent labs & imaging results that were available during my care of the patient were reviewed by me and considered in my medical decision making (see chart for details).      Final Clinical Impressions(s) / ED Diagnoses   Final diagnoses:  Migraine without status migrainosus, not intractable, unspecified migraine type    Labs:   Imaging:  Consults:  Therapeutics:  Discharge Meds:   Assessment/Plan: 21 year old female presents today with migraine. This seems typical of her previous. Unable to resolve symptoms with home medication. Patient is having slight dizziness, likely secondary to migraines. She'll be treated here in the ED with Reglan, Toradol, Benadryl and reassessed.  Upon  reassessment patient is sleeping in exam bed in no acute distress. Symptoms dramatically improved. Patient feels stable for discharge home. Presentation is likely migraine low suspicion for intracranial abnormality. She was discharged home with family. She is encouraged to continue using home medicines return immediately if any new or worsening signs or symptoms present. She verbalized or standing agreement to today's plan had no further questions or concerns    New Prescriptions Discharge Medication List as of 04/23/2017  3:07 AM       Eyvonne MechanicHedges, Mckenzye Cutright, PA-C 04/23/17 0350    Pricilla LovelessGoldston, Scott, MD 04/24/17 1454

## 2017-04-23 NOTE — ED Notes (Signed)
Pt reports she has a history of migraines and takes magnesium for same. Pt reports this has been going on now for the past week on and off but got really bad yesterday morning. Pt reports taking 2 BC powders and 10 mg of Ketorolac with no relief.

## 2017-04-23 NOTE — Discharge Instructions (Signed)
Please read attached information. If you experience any new or worsening signs or symptoms please return to the emergency room for evaluation. Please follow-up with your primary care provider or specialist as discussed.  °

## 2017-04-24 ENCOUNTER — Ambulatory Visit (HOSPITAL_COMMUNITY): Payer: 59

## 2017-04-24 ENCOUNTER — Encounter: Payer: Self-pay | Admitting: Internal Medicine

## 2017-04-27 ENCOUNTER — Ambulatory Visit (INDEPENDENT_AMBULATORY_CARE_PROVIDER_SITE_OTHER): Payer: 59 | Admitting: Internal Medicine

## 2017-04-27 ENCOUNTER — Encounter: Payer: Self-pay | Admitting: Internal Medicine

## 2017-04-27 VITALS — BP 110/68 | HR 84 | Temp 98.3°F | Wt 203.0 lb

## 2017-04-27 DIAGNOSIS — R42 Dizziness and giddiness: Secondary | ICD-10-CM | POA: Diagnosis not present

## 2017-04-27 DIAGNOSIS — G43819 Other migraine, intractable, without status migrainosus: Secondary | ICD-10-CM | POA: Diagnosis not present

## 2017-04-27 NOTE — Progress Notes (Signed)
Subjective:    Patient ID: [name] Sanchez, female    DOB: 25-Feb-1996, 21 y.o.   MRN: [id number]  HPI  Pt presents to the clinic today for ER followup. She went to the ER 5/19 with c/o a migraine that had started 1 day prior. The headache was located on the left side of her head. She reported associated lightheadedness, sensitivity to light and sound and nausea. She was given 1L of fluids, Toradol, Reglan and Benadryl with good resolution of symptoms. She reports she was referred to neurology and had a follow up appt today. Dr. Sherryll Burger ordered a brain MRI. He put her on a Prednisone Taper. He advised that she start Butter[place] for migraine prophylaxis. He gave her a RX for Fioricet to use as needed. He scheduled her for SPG blocks .  She also reports that she has been feeling lightheaded with position changes. She has taken her BP at home, and it has been as low as 85/55 (after 2 days of vomiting). It was 110/63 in the ER. Her BP today is 114/70. She is concerned that she may be orthostatic and would like further evaluation of this.  Review of Systems      Past Medical History:  Diagnosis Date  . Anxiety   . Gestational hypertension 01/2012   also had postpartum preelampsia  . Hx of ovarian cyst    x 2-3  . Hx of varicella   . Migraine   . Panic attack   . Plica of knee 08/2012   medial plica left knee    Current Outpatient Prescriptions  Medication Sig Dispense Refill  . ALPRAZolam (XANAX) 0.5 MG tablet Take 1 tablet (0.5 mg total) by mouth daily as needed for anxiety. (Patient not taking: Reported on 04/23/2017) 20 tablet 0  . PARoxetine (PAXIL) 20 MG tablet Take 1 tablet (20 mg total) by mouth daily. 30 tablet 3  . predniSONE (DELTASONE) 10 MG tablet Take 3 tabs on days 1-3, take 2 tabs on days 4-6, take 1 tab on days 7-9 (Patient not taking: Reported on 04/23/2017) 18 tablet 0   No current facility-administered medications for this visit.     Allergies  Allergen Reactions  .  Cefuroxime Axetil Rash    Family History  Problem Relation Age of Onset  . Hypertension Mother   . Heart disease Mother        MI  . Anesthesia problems Mother        post-op N/V  . Asthma Sister   . Bipolar disorder Sister   . Asthma Brother   . Diabetes Maternal Aunt   . Heart disease Father   . Bipolar disorder Father     Social History   Social History  . Marital status: Married    Spouse name: N/A  . Number of children: N/A  . Years of education: N/A   Occupational History  . Not on file.   Social History Main Topics  . Smoking status: Never Smoker  . Smokeless tobacco: Never Used  . Alcohol use No  . Drug use: No  . Sexual activity: Yes    Birth control/ protection: None     Comment: last night   Other Topics Concern  . Not on file   Social History Narrative  . No narrative on file     Constitutional: Pt reports headaches. Denies fever, malaise, fatigue, or abrupt weight changes.  Respiratory: Denies difficulty breathing, shortness of breath, cough or sputum production.  Cardiovascular: Denies chest pain, chest tightness, palpitations or swelling in the hands or feet.  Gastrointestinal: Pt reports nausea. Denies abdominal pain, bloating, constipation, diarrhea or blood in the stool.  Neurological: Pt reports lightheadedness. Denies dizziness, difficulty with memory, difficulty with speech or problems with balance and coordination.    No other specific complaints in a complete review of systems (except as listed in HPI above).  Objective:   Physical Exam   BP 110/68   Pulse 84   Temp 98.3 F (36.8 C) (Oral)   Wt 203 lb (92.1 kg)   LMP 04/17/2017   SpO2 98%   BMI 34.84 kg/m  Wt Readings from Last 3 Encounters:  04/27/17 203 lb (92.1 kg)  04/22/17 200 lb (90.7 kg)  04/18/17 204 lb 12.8 oz (92.9 kg)    General: Appears her stated age, obese in NAD. HEENT: Head: normal shape and size; Eyes: PERRLA and EOMs intact;  Cardiovascular: Normal  rate and rhythm. S1,S2 noted.  No murmur, rubs or gallops noted.  Pulmonary/Chest: Normal effort and positive vesicular breath sounds. No respiratory distress. No wheezes, rales or ronchi noted.  Neurological: Alert and oriented.    BMET    Component Value Date/Time   NA 139 06/22/2015 1541   K 3.9 06/22/2015 1541   CL 108 06/22/2015 1541   CO2 25 06/22/2015 1541   GLUCOSE 91 06/22/2015 1541   BUN 15 06/22/2015 1541   CREATININE 0.70 06/22/2015 1541   CALCIUM 9.1 06/22/2015 1541   GFRNONAA >90 09/18/2014 0110   GFRAA >90 09/18/2014 0110    Lipid Panel     Component Value Date/Time   CHOL 174 04/04/2012 1410   TRIG 62.0 04/04/2012 1410   HDL 58.30 04/04/2012 1410   CHOLHDL 3 04/04/2012 1410   VLDL 12.4 04/04/2012 1410   LDLCALC 103 (H) 04/04/2012 1410    CBC    Component Value Date/Time   WBC 6.9 06/22/2015 1541   RBC 4.01 06/22/2015 1541   HGB 12.6 06/22/2015 1541   HGB 12.7 11/17/2014 1441   HCT 37.1 06/22/2015 1541   HCT 38.5 11/17/2014 1441   PLT 215.0 06/22/2015 1541   PLT 206 11/17/2014 1441   MCV 92.6 06/22/2015 1541   MCV 95 11/17/2014 1441   MCH 31.4 11/17/2014 1441   MCH 32.8 09/19/2014 0605   MCHC 34.0 06/22/2015 1541   RDW 13.4 06/22/2015 1541   RDW 12.7 11/17/2014 1441   LYMPHSABS 2.0 06/22/2015 1541   LYMPHSABS 1.5 11/17/2014 1441   MONOABS 0.5 06/22/2015 1541   MONOABS 0.9 11/17/2014 1441   EOSABS 0.1 06/22/2015 1541   EOSABS 0.1 11/17/2014 1441   BASOSABS 0.1 06/22/2015 1541   BASOSABS 0.0 11/17/2014 1441    Hgb A1C No results found for: HGBA1C         Assessment & Plan:   ER Followup Migraines:  ER notes and labs reviewed Neurology notes reviewed She will go for scheduled MRI She will continue Fioricet and Prednisone as prescribed She will return for scheduled SPG injections  Lightheaded:  Orthostatics normal Encouraged fluid intake  RTC as needed or if symptoms persist or worsen Masaki Rothbauer, NP

## 2017-04-27 NOTE — Patient Instructions (Signed)

## 2017-05-03 ENCOUNTER — Other Ambulatory Visit (HOSPITAL_COMMUNITY): Payer: Self-pay | Admitting: Neurology

## 2017-05-03 ENCOUNTER — Other Ambulatory Visit: Payer: Self-pay | Admitting: Neurology

## 2017-05-03 DIAGNOSIS — G43119 Migraine with aura, intractable, without status migrainosus: Secondary | ICD-10-CM

## 2017-05-09 ENCOUNTER — Ambulatory Visit (INDEPENDENT_AMBULATORY_CARE_PROVIDER_SITE_OTHER): Payer: Self-pay | Admitting: Family Medicine

## 2017-05-12 ENCOUNTER — Ambulatory Visit (HOSPITAL_COMMUNITY)
Admission: RE | Admit: 2017-05-12 | Discharge: 2017-05-12 | Disposition: A | Discharge status: Home / Self Care | Payer: 59 | Source: Ambulatory Visit | Attending: Neurology | Admitting: Neurology

## 2017-05-12 DIAGNOSIS — G43119 Migraine with aura, intractable, without status migrainosus: Secondary | ICD-10-CM

## 2017-05-12 DIAGNOSIS — G43919 Migraine, unspecified, intractable, without status migrainosus: Secondary | ICD-10-CM | POA: Diagnosis present

## 2017-05-12 MED ORDER — GADOBENATE DIMEGLUMINE 529 MG/ML IV SOLN
20.0000 mL | Freq: Once | INTRAVENOUS | Status: AC
  Administered 2017-05-12: 20 mL via INTRAVENOUS

## 2017-05-24 ENCOUNTER — Encounter (INDEPENDENT_AMBULATORY_CARE_PROVIDER_SITE_OTHER): Payer: Self-pay | Admitting: Family Medicine

## 2017-05-24 ENCOUNTER — Ambulatory Visit (INDEPENDENT_AMBULATORY_CARE_PROVIDER_SITE_OTHER): Payer: 59 | Admitting: Family Medicine

## 2017-05-24 VITALS — BP 103/70 | HR 79 | Temp 98.5°F | Ht 64.0 in | Wt 202.0 lb

## 2017-05-24 DIAGNOSIS — E669 Obesity, unspecified: Secondary | ICD-10-CM

## 2017-05-24 DIAGNOSIS — R0602 Shortness of breath: Secondary | ICD-10-CM | POA: Diagnosis not present

## 2017-05-24 DIAGNOSIS — Z1389 Encounter for screening for other disorder: Secondary | ICD-10-CM

## 2017-05-24 DIAGNOSIS — Z0289 Encounter for other administrative examinations: Secondary | ICD-10-CM

## 2017-05-24 DIAGNOSIS — Z6834 Body mass index (BMI) 34.0-34.9, adult: Secondary | ICD-10-CM | POA: Diagnosis not present

## 2017-05-24 DIAGNOSIS — F418 Other specified anxiety disorders: Secondary | ICD-10-CM | POA: Diagnosis not present

## 2017-05-24 DIAGNOSIS — R5383 Other fatigue: Secondary | ICD-10-CM | POA: Diagnosis not present

## 2017-05-24 DIAGNOSIS — Z1331 Encounter for screening for depression: Secondary | ICD-10-CM

## 2017-05-24 NOTE — Progress Notes (Signed)
Office: [phone]  /  Fax: [phone]   Dear Lorre Munroe, NP,   Thank you for referring [name] Sanchez to our clinic. The following note includes my evaluation and treatment recommendations.  HPI:   Chief Complaint: OBESITY  [name] Sanchez has been referred by Lorre Munroe, NP for consultation regarding her obesity and obesity related comorbidities.  [name] Sanchez (MR# [id number]) is a 21 y.o. female who presents on 05/24/2017 for obesity evaluation and treatment. Current BMI is Body mass index is 34.67 kg/m.[name] Sanchez has struggled with obesity for years and has been unsuccessful in either losing weight or maintaining long term weight loss. [name] Sanchez attended our information session and states she is currently in the action stage of change and ready to dedicate time achieving and maintaining a healthier weight.  [name] Sanchez states her family eats meals together she thinks her family will eat healthier with  her her desired weight loss is 45 lbs she started gaining weight when her depression got bad her heaviest weight ever was 205 lbs. she is a picky eater and doesn't like to eat healthier foods  she skips meals frequently she is frequently drinking liquids with calories she frequently makes poor food choices she struggles with emotional eating    Fatigue [name] Sanchez feels her energy is lower than it should be. This has worsened with weight gain and has not worsened recently. [name] Sanchez admits to daytime somnolence and  admits to waking up still tired. [name] Sanchez is at risk for obstructive sleep apnea. Patent has a history of symptoms of daytime fatigue, morning fatigue and morning headache. [name] Sanchez generally gets 4 hours of sleep per night, and states they generally have restless sleep. Snoring is not present. Apneic episodes are present. Epworth Sleepiness Score is 14  Dyspnea on exertion [name] Sanchez notes increasing shortness of breath with exercising and seems to be worsening over time with  weight gain. She notes getting out of breath sooner with activity than she used to. This has not gotten worse recently. Njeri denies orthopnea.  Depression with Anxiety [name] Sanchez is on paxil and ativan prn. She has strong emotional eating behaviors. She struggles with emotional eating and using food for comfort to the extent that it is negatively impacting her health. She often snacks when she is not hungry. [name] Sanchez sometimes feels she is out of control and then feels guilty that she made poor food choices.  She shows no current signs of suicidal or homicidal ideations.  Depression Screen [name] Sanchez's Food and Mood (modified PHQ-9) score was  Depression screen PHQ 2/9 05/24/2017  Decreased Interest 3  Down, Depressed, Hopeless 3  PHQ - 2 Score 6  Altered sleeping 3  Tired, decreased energy 3  Change in appetite 3  Feeling bad or failure about yourself  3  Trouble concentrating 1  Moving slowly or fidgety/restless 0  Suicidal thoughts 2  PHQ-9 Score 21  Difficult doing work/chores -    ALLERGIES: Allergies  Allergen Reactions   Cefuroxime Axetil Rash    MEDICATIONS: Current Outpatient Prescriptions on File Prior to Visit  Medication Sig Dispense Refill   ALPRAZolam (XANAX) 0.5 MG tablet Take 1 tablet (0.5 mg total) by mouth daily as needed for anxiety. 20 tablet 0   PARoxetine (PAXIL) 20 MG tablet Take 1 tablet (20 mg total) by mouth daily. 30 tablet 3   rizatriptan (MAXALT) 10 MG tablet Take 10 mg by mouth as needed for migraine. May repeat in 2 hours if needed  No current facility-administered medications on file prior to visit.     PAST MEDICAL HISTORY: Past Medical History:  Diagnosis Date   Anxiety    Depression    Gestational hypertension 01/2012   also had postpartum preelampsia   History of stomach ulcers    Hx of ovarian cyst    x 2-3   Hx of varicella    IBS (irritable bowel syndrome)    Migraine    chronic   Panic attack    Plica of knee 08/2012     medial plica left knee    PAST SURGICAL HISTORY: Past Surgical History:  Procedure Laterality Date   KNEE ARTHROSCOPY  08/17/2012   Procedure: ARTHROSCOPY KNEE;  Surgeon: Harvie JuniorJohn L Graves, MD;  Location: [facility];  Service: Orthopedics;  Laterality: Left;  left knee scope with tight lateral retinacular release and plica removal   TYMPANOSTOMY TUBE PLACEMENT      SOCIAL HISTORY: Social History  Substance Use Topics   Smoking status: Never Smoker   Smokeless tobacco: Never Used   Alcohol use No    FAMILY HISTORY: Family History  Problem Relation Age of Onset   Hypertension Mother    Heart disease Mother        MI   Anesthesia problems Mother        post-op N/V   Depression Mother    Anxiety disorder Mother    Obesity Mother    Asthma Sister    Bipolar disorder Sister    Asthma Brother    Diabetes Maternal Aunt    Heart disease Father    Bipolar disorder Father    Hyperlipidemia Father     ROS: Review of Systems  Constitutional: Positive for malaise/fatigue.  Eyes:       Wear Glasses or Contacts Flashes of Light Floaters  Respiratory: Positive for shortness of breath (on exertion).   Gastrointestinal: Positive for nausea.  Musculoskeletal:       Leg Cramping  Neurological: Positive for dizziness and headaches.  Endo/Heme/Allergies: Bruises/bleeds easily (easy bruising).  Psychiatric/Behavioral: Positive for depression. Negative for suicidal ideas. The [name] Sanchez is nervous/anxious (anxiety).        Stress    PHYSICAL EXAM: Blood pressure 103/70, pulse 79, temperature 98.5 F (36.9 C), temperature source Oral, height 5\' 4"  (1.626 m), weight 202 lb (91.6 kg), last menstrual period 05/17/2017, SpO2 97 %, unknown if currently breastfeeding. Body mass index is 34.67 kg/m. Physical Exam  Constitutional: She is oriented to person, place, and time. She appears well-developed and well-nourished.  Cardiovascular: Normal rate.    Pulmonary/Chest: Effort normal.  Musculoskeletal: Normal range of motion.  Neurological: She is oriented to person, place, and time.  Skin: Skin is warm and dry.  Vitals reviewed.   RECENT LABS AND TESTS: BMET    Component Value Date/Time   NA 139 06/22/2015 1541   K 3.9 06/22/2015 1541   CL 108 06/22/2015 1541   CO2 25 06/22/2015 1541   GLUCOSE 91 06/22/2015 1541   BUN 15 06/22/2015 1541   CREATININE 0.70 06/22/2015 1541   CALCIUM 9.1 06/22/2015 1541   GFRNONAA >90 09/18/2014 0110   GFRAA >90 09/18/2014 0110   No results found for: HGBA1C No results found for: INSULIN CBC    Component Value Date/Time   WBC 6.9 06/22/2015 1541   RBC 4.01 06/22/2015 1541   HGB 12.6 06/22/2015 1541   HGB 12.7 11/17/2014 1441   HCT 37.1 06/22/2015 1541   HCT 38.5 11/17/2014  1441   PLT 215.0 06/22/2015 1541   PLT 206 11/17/2014 1441   MCV 92.6 06/22/2015 1541   MCV 95 11/17/2014 1441   MCH 31.4 11/17/2014 1441   MCH 32.8 09/19/2014 0605   MCHC 34.0 06/22/2015 1541   RDW 13.4 06/22/2015 1541   RDW 12.7 11/17/2014 1441   LYMPHSABS 2.0 06/22/2015 1541   LYMPHSABS 1.5 11/17/2014 1441   MONOABS 0.5 06/22/2015 1541   MONOABS 0.9 11/17/2014 1441   EOSABS 0.1 06/22/2015 1541   EOSABS 0.1 11/17/2014 1441   BASOSABS 0.1 06/22/2015 1541   BASOSABS 0.0 11/17/2014 1441   Iron/TIBC/Ferritin/ %Sat No results found for: IRON, TIBC, FERRITIN, IRONPCTSAT Lipid Panel     Component Value Date/Time   CHOL 174 04/04/2012 1410   TRIG 62.0 04/04/2012 1410   HDL 58.30 04/04/2012 1410   CHOLHDL 3 04/04/2012 1410   VLDL 12.4 04/04/2012 1410   LDLCALC 103 (H) 04/04/2012 1410   Hepatic Function Panel     Component Value Date/Time   PROT 6.6 06/22/2015 1541   ALBUMIN 4.1 06/22/2015 1541   AST 16 06/22/2015 1541   ALT 15 06/22/2015 1541   ALKPHOS 50 06/22/2015 1541   BILITOT 0.3 06/22/2015 1541   BILIDIR 0.0 12/25/2012 1420      Component Value Date/Time   TSH 1.43 11/17/2014 1441   TSH  1.45 04/04/2012 1410    ECG  shows NSR with a rate of 78 BPM INDIRECT CALORIMETER done today shows a VO2 of 209 and a REE of 1455.    ASSESSMENT AND PLAN: Other fatigue - Plan: EKG 12-Lead, Vitamin B12, CBC With Differential, Comprehensive metabolic panel, Hemoglobin A1c, Insulin, random, Lipid Panel With LDL/HDL Ratio, T3, T4, free, TSH, VITAMIN D 25 Hydroxy (Vit-D Deficiency, Fractures), Folate  Shortness of breath on exertion  Depression with anxiety  Depression screening  Class 1 obesity without serious comorbidity with body mass index (BMI) of 34.0 to 34.9 in adult, unspecified obesity type  PLAN: Fatigue Timeka was informed that her fatigue may be related to obesity, depression or many other causes. Labs will be ordered, and in the meanwhile Marlinda has agreed to work on diet, exercise and weight loss to help with fatigue. Proper sleep hygiene was discussed including the need for 7-8 hours of quality sleep each night. A sleep study was not ordered based on symptoms and Epworth score.  Dyspnea on exertion Nazareth's shortness of breath appears to be obesity related and exercise induced. She has agreed to work on weight loss and gradually increase exercise to treat her exercise induced shortness of breath. If Dorris follows our instructions and loses weight without improvement of her shortness of breath, we will plan to refer to pulmonology. We will monitor this condition regularly. Desmond agrees to this plan.  Depression with Emotional Eating Behaviors We discussed behavior modification techniques today to help Dwanda deal with her emotional eating and depression. She has agreed to continue her medications as prescribed and will work on cognitive behavioral therapy to help decrease emotional eating. Kadeshia agreed to follow up as directed.  Depression Screen Stasha had a strongly positive depression screening. Depression is commonly associated with obesity and often results in  emotional eating behaviors. We will monitor this closely and work on CBT to help improve the non-hunger eating patterns. Referral to Psychology may be required if no improvement is seen as she continues in our clinic.  Obesity Stassi is currently in the action stage of change and her goal is to  continue with weight loss efforts. I recommend Arzu begin the structured treatment plan as follows:  She has agreed to follow the Category 2 plan Neil has been instructed to eventually work up to a goal of 150 minutes of combined cardio and strengthening exercise per week for weight loss and overall health benefits. We discussed the following Behavioral Modification Strategies today: meal planning & cooking strategies, keeping healthy foods in the home, increasing lean protein intake and increasing vegetables  Paola has agreed to join our obesity program and follow up with our clinic in 2 to 3 weeks. She was informed of the importance of frequent follow up visits to maximize her success with intensive lifestyle modifications for her multiple health conditions. She was informed we would discuss her lab results at her next visit unless there is a critical issue that needs to be addressed sooner. Eleri agreed to keep her next visit at the agreed upon time to discuss these results.  I, Nevada Crane, am acting as transcriptionist for Quillian Quince, MD  I have reviewed the above documentation for accuracy and completeness, and I agree with the above. -Quillian Quince, MD   OBESITY BEHAVIORAL INTERVENTION VISIT  Today's visit was # 1 out of 22.  Starting weight: 202 lbs Starting date: 05/24/17 Today's weight : 202 lbs Today's date: 05/24/2017 Total lbs lost to date: 0 (Patients must lose 7 lbs in the first 6 months to continue with counseling)   ASK: We discussed the diagnosis of obesity with [name] Sanchez today and Sophiamarie agreed to give [place] permission to discuss obesity behavioral modification  therapy today.  ASSESS: Florestine has the diagnosis of obesity and her BMI today is 34.7 Clarivel is in the action stage of change   ADVISE: Leilene was educated on the multiple health risks of obesity as well as the benefit of weight loss to improve her health. She was advised of the need for long term treatment and the importance of lifestyle modifications.  AGREE: Multiple dietary modification options and treatment options were discussed and  Haja agreed to follow the Category 2 plan We discussed the following Behavioral Modification Strategies today: meal planning & cooking strategies, keeping healthy foods in the home, increasing lean protein intake and increasing vegetables

## 2017-05-25 LAB — COMPREHENSIVE METABOLIC PANEL
ALT: 16 IU/L (ref 0–32)
AST: 18 IU/L (ref 0–40)
Albumin/Globulin Ratio: 1.4 (ref 1.2–2.2)
Albumin: 4.2 g/dL (ref 3.5–5.5)
Alkaline Phosphatase: 61 IU/L (ref 39–117)
BILIRUBIN TOTAL: 0.4 mg/dL (ref 0.0–1.2)
BUN/Creatinine Ratio: 21 (ref 9–23)
BUN: 14 mg/dL (ref 6–20)
CHLORIDE: 99 mmol/L (ref 96–106)
CO2: 24 mmol/L (ref 20–29)
Calcium: 9.6 mg/dL (ref 8.7–10.2)
Creatinine, Ser: 0.66 mg/dL (ref 0.57–1.00)
GFR calc non Af Amer: 126 mL/min/{1.73_m2} (ref >59)
GFR, EST AFRICAN AMERICAN: 145 mL/min/{1.73_m2} (ref >59)
GLUCOSE: 84 mg/dL (ref 65–99)
Globulin, Total: 2.9 g/dL (ref 1.5–4.5)
Potassium: 4.6 mmol/L (ref 3.5–5.2)
Sodium: 139 mmol/L (ref 134–144)
TOTAL PROTEIN: 7.1 g/dL (ref 6.0–8.5)

## 2017-05-25 LAB — LIPID PANEL WITH LDL/HDL RATIO
CHOLESTEROL TOTAL: 168 mg/dL (ref 100–199)
HDL: 59 mg/dL (ref >39)
LDL Calculated: 94 mg/dL (ref 0–99)
LDl/HDL Ratio: 1.6 ratio (ref 0.0–3.2)
Triglycerides: 76 mg/dL (ref 0–149)
VLDL CHOLESTEROL CAL: 15 mg/dL (ref 5–40)

## 2017-05-25 LAB — CBC WITH DIFFERENTIAL
BASOS ABS: 0 10*3/uL (ref 0.0–0.2)
Basos: 1 %
EOS (ABSOLUTE): 0.1 10*3/uL (ref 0.0–0.4)
Eos: 2 %
Hematocrit: 41.2 % (ref 34.0–46.6)
Hemoglobin: 13.3 g/dL (ref 11.1–15.9)
IMMATURE GRANS (ABS): 0 10*3/uL (ref 0.0–0.1)
IMMATURE GRANULOCYTES: 0 %
LYMPHS: 32 %
Lymphocytes Absolute: 1.6 10*3/uL (ref 0.7–3.1)
MCH: 30.5 pg (ref 26.6–33.0)
MCHC: 32.3 g/dL (ref 31.5–35.7)
MCV: 95 fL (ref 79–97)
Monocytes Absolute: 0.4 10*3/uL (ref 0.1–0.9)
Monocytes: 9 %
NEUTROS ABS: 2.8 10*3/uL (ref 1.4–7.0)
NEUTROS PCT: 56 %
RBC: 4.36 x10E6/uL (ref 3.77–5.28)
RDW: 13.3 % (ref 12.3–15.4)
WBC: 5 10*3/uL (ref 3.4–10.8)

## 2017-05-25 LAB — VITAMIN D 25 HYDROXY (VIT D DEFICIENCY, FRACTURES): Vit D, 25-Hydroxy: 31.1 ng/mL (ref 30.0–100.0)

## 2017-05-25 LAB — HEMOGLOBIN A1C
Est. average glucose Bld gHb Est-mCnc: 100 mg/dL
Hgb A1c MFr Bld: 5.1 % (ref 4.8–5.6)

## 2017-05-25 LAB — TSH: TSH: 1.79 u[IU]/mL (ref 0.450–4.500)

## 2017-05-25 LAB — INSULIN, RANDOM: INSULIN: 11.3 u[IU]/mL (ref 2.6–24.9)

## 2017-05-25 LAB — FOLATE

## 2017-05-25 LAB — T4, FREE: FREE T4: 1.02 ng/dL (ref 0.82–1.77)

## 2017-05-25 LAB — VITAMIN B12: VITAMIN B 12: 389 pg/mL (ref 232–1245)

## 2017-05-25 LAB — T3: T3, Total: 114 ng/dL (ref 71–180)

## 2017-06-01 MED FILL — RIZATRIPTAN 10 MG TABLET: 10 | 17 days supply | Qty: 10 | Fill #0 | Status: TO

## 2017-06-01 MED FILL — PARoxetine HCL 20 MG TABS: 20 | 30 days supply | Qty: 30 | Fill #0

## 2017-06-12 ENCOUNTER — Ambulatory Visit (INDEPENDENT_AMBULATORY_CARE_PROVIDER_SITE_OTHER): Payer: Self-pay | Admitting: Family Medicine

## 2017-06-12 ENCOUNTER — Encounter (INDEPENDENT_AMBULATORY_CARE_PROVIDER_SITE_OTHER): Payer: Self-pay

## 2017-07-11 MED FILL — PARoxetine HCL 20 MG TABS: 20 | 30 days supply | Qty: 30 | Fill #1

## 2017-07-11 MED FILL — RIZATRIPTAN 10 MG TABLET: 10 | 17 days supply | Qty: 10 | Fill #1 | Status: TO

## 2017-07-31 ENCOUNTER — Ambulatory Visit (INDEPENDENT_AMBULATORY_CARE_PROVIDER_SITE_OTHER): Payer: 59 | Admitting: Family Medicine

## 2017-07-31 ENCOUNTER — Encounter: Payer: Self-pay | Admitting: Family Medicine

## 2017-07-31 VITALS — BP 111/77 | HR 92 | Ht 64.0 in | Wt 211.0 lb

## 2017-07-31 DIAGNOSIS — Z01411 Encounter for gynecological examination (general) (routine) with abnormal findings: Secondary | ICD-10-CM | POA: Diagnosis not present

## 2017-07-31 DIAGNOSIS — Z113 Encounter for screening for infections with a predominantly sexual mode of transmission: Secondary | ICD-10-CM

## 2017-07-31 DIAGNOSIS — R102 Pelvic and perineal pain unspecified side: Secondary | ICD-10-CM

## 2017-07-31 DIAGNOSIS — Z1389 Encounter for screening for other disorder: Secondary | ICD-10-CM

## 2017-07-31 DIAGNOSIS — N760 Acute vaginitis: Secondary | ICD-10-CM

## 2017-07-31 DIAGNOSIS — B9689 Other specified bacterial agents as the cause of diseases classified elsewhere: Secondary | ICD-10-CM

## 2017-07-31 MED ORDER — METRONIDAZOLE 500 MG PO TABS
500.0000 mg | ORAL_TABLET | Freq: Two times a day (BID) | ORAL | 0 refills | Status: DC
Start: 2017-07-31 — End: 2017-08-31

## 2017-07-31 MED ORDER — DOXYCYCLINE HYCLATE 100 MG PO CAPS: 100.0000 mg | ORAL_CAPSULE | Freq: Two times a day (BID) | ORAL | 0 refills | Status: DC

## 2017-07-31 NOTE — Patient Instructions (Signed)
Pelvic Pain, Female °Pelvic pain is pain in your lower belly (abdomen), below your belly button and between your hips. The pain may start suddenly (acute), keep coming back (recurring), or last a long time (chronic). Pelvic pain that lasts longer than six months is considered chronic. There are many causes of pelvic pain. Sometimes the cause of your pelvic pain is not known. °Follow these instructions at home: °· Take over-the-counter and prescription medicines only as told by your doctor. °· Rest as told by your doctor. °· Do not have sex it if hurts. °· Keep a journal of your pelvic pain. Write down: °? When the pain started. °? Where the pain is located. °? What seems to make the pain better or worse, such as food or your menstrual cycle. °? Any symptoms you have along with the pain. °· Keep all follow-up visits as told by your doctor. This is important. °Contact a doctor if: °· Medicine does not help your pain. °· Your pain comes back. °· You have new symptoms. °· You have unusual vaginal discharge or bleeding. °· You have a fever or chills. °· You are having a hard time pooping (constipation). °· You have blood in your pee (urine) or poop (stool). °· Your pee smells bad. °· You feel weak or lightheaded. °Get help right away if: °· You have sudden pain that is very bad. °· Your pain continues to get worse. °· You have very bad pain and also have any of the following symptoms: °? A fever. °? Feeling stick to your stomach (nausea). °? Throwing up (vomiting). °? Being very sweaty. °· You pass out (lose consciousness). °This information is not intended to replace advice given to you by your health care provider. Make sure you discuss any questions you have with your health care provider. °Document Released: 05/09/2008 Document Revised: 12/16/2015 Document Reviewed: 09/11/2015 °Elsevier Interactive Patient Education © 2018 [employer] ° °

## 2017-07-31 NOTE — Progress Notes (Signed)
Subjective:     [name] Sanchez is a 21 y.o. female and is here for a comprehensive physical exam. The patient reports problems - pelvic pain. Notes painful intercourse since birth of her child. Notes pain in her lower mid-abdomen. Pain is on and off throughout the month. Not related to the cycles. Notes pain with menses is down through her legs. Notes cycles are heavy since birth of her last child. Cycles are now 3 1/2 days. Desires hysterectomy. Has been on low dose contraception in the past and notes that she has racing heart and blood pressure shoots up. Notes  Social History   Social History  . Marital status: Married    Spouse name: Will  . Number of children: 2  . Years of education: N/A   Occupational History  . OT [employer]   Social History Main Topics  . Smoking status: Never Smoker  . Smokeless tobacco: Never Used  . Alcohol use No  . Drug use: No  . Sexual activity: Yes    Partners: Male    Birth control/ protection: None     Comment: last night   Other Topics Concern  . Not on file   Social History Narrative  . No narrative on file   Health Maintenance  Topic Date Due  . TETANUS/TDAP  11/14/2013  . CHLAMYDIA SCREENING  02/21/2015  . PAP SMEAR  11/15/2015  . INFLUENZA VACCINE  07/05/2017  . HIV Screening  Completed    The following portions of the patient's history were reviewed and updated as appropriate: allergies, current medications, past family history, past medical history, past social history, past surgical history and problem list.  Review of Systems Pertinent items noted in HPI and remainder of comprehensive ROS otherwise negative.   Objective:    BP 111/77   Pulse 92   Ht 5\' 4"  (1.626 m)   Wt 211 lb (95.7 kg)   LMP 07/16/2017   Breastfeeding? No   BMI 36.22 kg/m  General appearance: alert, cooperative and appears stated age Head: Normocephalic, without obvious abnormality, atraumatic Neck: no adenopathy, supple, symmetrical,  trachea midline and thyroid not enlarged, symmetric, no tenderness/mass/nodules Lungs: clear to auscultation bilaterally Breasts: normal appearance, no masses or tenderness Heart: regular rate and rhythm, S1, S2 normal, no murmur, click, rub or gallop Abdomen: soft, non-tender; bowel sounds normal; no masses,  no organomegaly Pelvic: cervix normal in appearance, external genitalia normal, no adnexal masses or tenderness, no cervical motion tenderness, uterus normal size, shape, and consistency, vagina normal without discharge and uterus is exquisitely tender Extremities: extremities normal, atraumatic, no cyanosis or edema Pulses: 2+ and symmetric Skin: Skin color, texture, turgor normal. No rashes or lesions Lymph nodes: Cervical, supraclavicular, and axillary nodes normal. Neurologic: Grossly normal    Assessment:    Healthy female exam.      Plan:      Problem List Items Addressed This Visit      Unprioritized   Pelvic pain    Lengthy conversation around pelvic pain and possible issues. Will check pelvic sono--treat presumptively for chronic endometritis and see if this helps. Discussed hysterectomy at length and the inability to predict if this will fix her pain. Will await results and decide future treatment options based on these.      Relevant Medications   doxycycline (VIBRAMYCIN) 100 MG capsule   Other Relevant Orders   Cervicovaginal ancillary only   US Transvaginal Non-OB    Other Visit Diagnoses  Encounter for gynecological examination with abnormal finding    -  Primary   Bacterial vaginosis       Relevant Medications   metroNIDAZOLE (FLAGYL) 500 MG tablet      See After Visit Summary for Counseling Recommendations

## 2017-07-31 NOTE — Progress Notes (Signed)
Intermittent pelvic pain for 5 years and was not evaluated for this [facility] - After last child, periods are very heavy with large clots. She also thinks she might have BV and would like to have that tested.  Last Pap was in June 2017 and it was normal per pt.

## 2017-08-01 ENCOUNTER — Other Ambulatory Visit: Payer: Self-pay | Admitting: Internal Medicine

## 2017-08-01 DIAGNOSIS — F32A Depression, unspecified: Secondary | ICD-10-CM

## 2017-08-01 DIAGNOSIS — F329 Major depressive disorder, single episode, unspecified: Secondary | ICD-10-CM

## 2017-08-01 DIAGNOSIS — F419 Anxiety disorder, unspecified: Principal | ICD-10-CM

## 2017-08-01 LAB — CERVICOVAGINAL ANCILLARY ONLY
Bacterial vaginitis: NEGATIVE
CHLAMYDIA, DNA PROBE: NEGATIVE
Candida vaginitis: NEGATIVE
NEISSERIA GONORRHEA: NEGATIVE
TRICH (WINDOWPATH): NEGATIVE

## 2017-08-01 NOTE — Assessment & Plan Note (Signed)
Lengthy conversation around pelvic pain and possible issues. Will check pelvic sono--treat presumptively for chronic endometritis and see if this helps. Discussed hysterectomy at length and the inability to predict if this will fix her pain. Will await results and decide future treatment options based on these.

## 2017-08-02 ENCOUNTER — Ambulatory Visit: Payer: 59

## 2017-08-02 MED ORDER — ALPRAZOLAM 0.5 MG PO TABS: 0.5000 mg | ORAL_TABLET | Freq: Every day | ORAL | 0 refills | Status: DC | PRN

## 2017-08-02 NOTE — Telephone Encounter (Signed)
Ok to phone in Xanax 

## 2017-08-02 NOTE — Telephone Encounter (Signed)
Rx called in to pharmacy. 

## 2017-08-02 NOTE — Telephone Encounter (Signed)
Last filled 03/21/17... Please advise

## 2017-08-04 ENCOUNTER — Ambulatory Visit (HOSPITAL_COMMUNITY)
Admission: RE | Admit: 2017-08-04 | Discharge: 2017-08-04 | Disposition: A | Discharge status: Home / Self Care | Payer: 59 | Source: Ambulatory Visit | Attending: Family Medicine | Admitting: Family Medicine

## 2017-08-04 DIAGNOSIS — R102 Pelvic and perineal pain: Secondary | ICD-10-CM | POA: Diagnosis not present

## 2017-08-08 ENCOUNTER — Encounter: Payer: Self-pay | Admitting: Family Medicine

## 2017-08-10 MED FILL — PARoxetine HCL 20 MG TABS: 20 | 30 days supply | Qty: 30 | Fill #2

## 2017-08-15 ENCOUNTER — Ambulatory Visit: Payer: 59 | Admitting: Family Medicine

## 2017-08-31 ENCOUNTER — Ambulatory Visit (INDEPENDENT_AMBULATORY_CARE_PROVIDER_SITE_OTHER): Payer: Medicaid Other | Admitting: Family Medicine

## 2017-08-31 ENCOUNTER — Encounter: Payer: Self-pay | Admitting: Family Medicine

## 2017-08-31 VITALS — BP 116/80 | HR 83 | Ht 64.0 in | Wt 211.0 lb

## 2017-08-31 DIAGNOSIS — G43109 Migraine with aura, not intractable, without status migrainosus: Secondary | ICD-10-CM

## 2017-08-31 DIAGNOSIS — Z309 Encounter for contraceptive management, unspecified: Secondary | ICD-10-CM

## 2017-08-31 DIAGNOSIS — R102 Pelvic and perineal pain: Secondary | ICD-10-CM

## 2017-08-31 MED ORDER — NORGESTIMATE-ETH ESTRADIOL 0.25-35 MG-MCG PO TABS: 1.0000 | ORAL_TABLET | Freq: Every day | ORAL | 3 refills | Status: DC

## 2017-08-31 MED ORDER — DICLOFENAC SODIUM 75 MG PO TBEC: 75.0000 mg | DELAYED_RELEASE_TABLET | Freq: Two times a day (BID) | ORAL | 2 refills | Status: DC

## 2017-08-31 NOTE — Progress Notes (Signed)
   Subjective:    Patient ID: [name] Sanchez is a 21 y.o. female presenting with Pelvic Pain  on 08/31/2017  HPI: Reports new left sided pain x 1 day. Notes that it hurts to wear her pants. Still with midline pain. Is feeling really nauseated today. Took tylenol last pm but noted pain kept her up. She has had a normal u/s and she has had a course of doxy without improvement.  Review of Systems  Constitutional: Negative for chills and fever.  Respiratory: Negative for shortness of breath.   Cardiovascular: Negative for chest pain.  Gastrointestinal: Negative for abdominal pain, nausea and vomiting.  Genitourinary: Negative for dysuria.  Skin: Negative for rash.      Objective:    BP 116/80   Pulse 83   Ht  (1.626 m)   Wt 211 lb (95.7 kg)   LMP 08/16/2017   BMI 36.22 kg/m  Physical Exam  Constitutional: She is oriented to person, place, and time. She appears well-developed and well-nourished. No distress.  HENT:  Head: Normocephalic and atraumatic.  Eyes: No scleral icterus.  Neck: Neck supple.  Cardiovascular: Normal rate.   Pulmonary/Chest: Effort normal.  Abdominal: Soft.  Neurological: She is alert and oriented to person, place, and time.  Skin: Skin is warm and dry.  Psychiatric: She has a normal mood and affect.      Assessment & Plan:   Problem List Items Addressed This Visit      Unprioritized   Migraine with aura   Relevant Medications   diclofenac (VOLTAREN) 75 MG EC tablet   Pelvic pain - Primary    ? Adenomyosis, or endometriosis. Diagnosis is in question. After a very lengthy conversation around options, patient elected for hysterectomy. Hysterectomy discussed at length, including alternative treatment with Aygestin or IUD, or OC's.  Risks include but are not limited to bleeding, infection, injury to surrounding structures, including bowel, bladder and ureters, blood clots, and death.  Likelihood of success is high.  Discussed Laparoscopy with removal  of any ovvious endometriosis with TVH is most likely way to perform and how long pt. Would be out of work. Risks/benefits of ovarian removal discussed. She will preserve her ovaries. Also discussed risk that pain may continue after surgery, further w/u may be needed as well as her age at time of hysterectomy and possible desire for more kids in the future. She is quite sure of this decision. Husband with vasectomy already.      Relevant Medications   diclofenac (VOLTAREN) 75 MG EC tablet   norgestimate-ethinyl estradiol (ORTHO-CYCLEN,SPRINTEC,PREVIFEM) 0.25-35 MG-MCG tablet      Total face-to-face time with patient: 25 minutes. Over 50% of encounter was spent on counseling and coordination of care.  Reva Bores 08/31/2017 9:05 AM

## 2017-08-31 NOTE — Progress Notes (Signed)
Started Tuesday left side radiating around front of abdomen. Nausea started this morning.  Declined flu shot

## 2017-09-01 NOTE — Patient Instructions (Signed)
Hysterectomy Information A hysterectomy is a surgery to remove your uterus. After surgery, you will no longer have periods. Also, you will not be able to get pregnant. Reasons for this surgery  You have bleeding that is not normal and keeps coming back.  You have lasting (chronic) lower belly (pelvic) pain.  You have a lasting infection.  The lining of your uterus grows outside your uterus.  The lining of your uterus grows in the muscle of your uterus.  Your uterus falls down into your vagina.  You have a growth in your uterus that causes problems.  You have cells that could turn into cancer (precancerous cells).  You have cancer of the uterus or cervix. Types There are 3 types of hysterectomies. Depending on the type, the surgery will:  Remove the top part of the uterus only.  Remove the uterus and the cervix.  Remove the uterus, cervix, and tissue that holds the uterus in place in the lower belly.  Ways a hysterectomy can be performed There are 5 ways this surgery can be performed.  A cut (incision) is made in the belly (abdomen). The uterus is taken out through the cut.  A cut is made in the vagina. The uterus is taken out through the cut.  Three or four cuts are made in the belly. A surgical device with a camera is put through one of the cuts. The uterus is cut into small pieces. The uterus is taken out through the cuts or the vagina.  Three or four cuts are made in the belly. A surgical device with a camera is put through one of the cuts. The uterus is taken out through the vagina.  Three or four cuts are made in the belly. A surgical device that is controlled by a computer makes a visual image. The device helps the surgeon control the surgical tools. The uterus is cut into small pieces. The pieces are taken out through the cuts or through the vagina.  What can I expect after the surgery?  You will be given pain medicine.  You will need help at home for 3-5 days  after surgery.  You will need to see your doctor in 2-4 weeks after surgery.  You may get hot flashes, have night sweats, and have trouble sleeping.  You may need to have Pap tests in the future if your surgery was related to cancer. Talk to your doctor. It is still good to have regular exams. This information is not intended to replace advice given to you by your health care provider. Make sure you discuss any questions you have with your health care provider. Document Released: 02/13/2012 Document Revised: 04/28/2016 Document Reviewed: 07/29/2013 Elsevier Interactive Patient Education  2018 Elsevier Inc.  

## 2017-09-01 NOTE — Assessment & Plan Note (Addendum)
?   Adenomyosis, or endometriosis. Diagnosis is in question. After a very lengthy conversation around options, patient elected for hysterectomy. Hysterectomy discussed at length, including alternative treatment with Aygestin or IUD (not a candidate for OC's due to migraine with aura).  Risks include but are not limited to bleeding, infection, injury to surrounding structures, including bowel, bladder and ureters, blood clots, and death.  Likelihood of success is high.  Discussed Laparoscopy with removal of any ovvious endometriosis with TVH is most likely way to perform and how long pt. Would be out of work. Risks/benefits of ovarian removal discussed. She will preserve her ovaries. Also discussed risk that pain may continue after surgery, further w/u may be needed as well as her age at time of hysterectomy and possible desire for more kids in the future. She is quite sure of this decision. Husband with vasectomy already.

## 2017-09-13 ENCOUNTER — Encounter: Payer: Self-pay | Admitting: Internal Medicine

## 2017-09-15 ENCOUNTER — Other Ambulatory Visit: Payer: Self-pay | Admitting: Internal Medicine

## 2017-09-15 MED ORDER — PAROXETINE HCL 20 MG PO TABS: 20.0000 mg | ORAL_TABLET | Freq: Every day | ORAL | 2 refills | Status: DC

## 2017-09-15 MED FILL — PARoxetine HCL 20 MG TABS: 20 | 30 days supply | Qty: 30 | Fill #0

## 2017-10-02 ENCOUNTER — Encounter (HOSPITAL_COMMUNITY): Payer: Self-pay

## 2017-10-12 ENCOUNTER — Encounter: Payer: Self-pay | Admitting: Radiology

## 2017-10-18 MED FILL — PARoxetine HCL 20 MG TABS: 20 | 30 days supply | Qty: 30 | Fill #1

## 2017-11-16 MED FILL — PARoxetine HCL 20 MG TABS: 20 | 30 days supply | Qty: 30 | Fill #2

## 2017-12-11 ENCOUNTER — Encounter: Payer: Self-pay | Admitting: Family Medicine

## 2017-12-11 ENCOUNTER — Other Ambulatory Visit (HOSPITAL_COMMUNITY)
Admission: RE | Admit: 2017-12-11 | Discharge: 2017-12-11 | Disposition: A | Discharge status: Home / Self Care | Payer: Medicaid Other | Source: Ambulatory Visit | Attending: Family Medicine | Admitting: Family Medicine

## 2017-12-11 ENCOUNTER — Ambulatory Visit (INDEPENDENT_AMBULATORY_CARE_PROVIDER_SITE_OTHER): Payer: Medicaid Other | Admitting: Family Medicine

## 2017-12-11 VITALS — BP 129/81 | HR 92 | Wt 213.2 lb

## 2017-12-11 DIAGNOSIS — Z01411 Encounter for gynecological examination (general) (routine) with abnormal findings: Secondary | ICD-10-CM

## 2017-12-11 DIAGNOSIS — Z124 Encounter for screening for malignant neoplasm of cervix: Secondary | ICD-10-CM

## 2017-12-11 DIAGNOSIS — Z309 Encounter for contraceptive management, unspecified: Secondary | ICD-10-CM | POA: Diagnosis not present

## 2017-12-11 DIAGNOSIS — R102 Pelvic and perineal pain: Secondary | ICD-10-CM

## 2017-12-11 MED ORDER — NORGESTIMATE-ETH ESTRADIOL 0.25-35 MG-MCG PO TABS: 1.0000 | ORAL_TABLET | Freq: Every day | ORAL | 3 refills | Status: DC

## 2017-12-11 NOTE — Assessment & Plan Note (Signed)
Still having pain--she still wants to pursue hysterectomy. She will look into Obamacare and charity care.

## 2017-12-11 NOTE — Patient Instructions (Signed)
Pelvic Pain, Female °Pelvic pain is pain in your lower belly (abdomen), below your belly button and between your hips. The pain may start suddenly (acute), keep coming back (recurring), or last a long time (chronic). Pelvic pain that lasts longer than six months is considered chronic. There are many causes of pelvic pain. Sometimes the cause of your pelvic pain is not known. °Follow these instructions at home: °· Take over-the-counter and prescription medicines only as told by your doctor. °· Rest as told by your doctor. °· Do not have sex it if hurts. °· Keep a journal of your pelvic pain. Write down: °? When the pain started. °? Where the pain is located. °? What seems to make the pain better or worse, such as food or your menstrual cycle. °? Any symptoms you have along with the pain. °· Keep all follow-up visits as told by your doctor. This is important. °Contact a doctor if: °· Medicine does not help your pain. °· Your pain comes back. °· You have new symptoms. °· You have unusual vaginal discharge or bleeding. °· You have a fever or chills. °· You are having a hard time pooping (constipation). °· You have blood in your pee (urine) or poop (stool). °· Your pee smells bad. °· You feel weak or lightheaded. °Get help right away if: °· You have sudden pain that is very bad. °· Your pain continues to get worse. °· You have very bad pain and also have any of the following symptoms: °? A fever. °? Feeling stick to your stomach (nausea). °? Throwing up (vomiting). °? Being very sweaty. °· You pass out (lose consciousness). °This information is not intended to replace advice given to you by your health care provider. Make sure you discuss any questions you have with your health care provider. °Document Released: 05/09/2008 Document Revised: 12/16/2015 Document Reviewed: 09/11/2015 °Elsevier Interactive Patient Education © 2018 [employer] ° °

## 2017-12-11 NOTE — Progress Notes (Signed)
Subjective:    Patient ID: [name] Sanchez is a 22 y.o. female presenting with gyn follow up (birth contol )  on 12/11/2017  HPI: Here for annual exam. Has previously been seen for chronic pelvic pain and abnormal bleeding. Last pap in 2017.  Had scheduled hysterectomy but her insurance does not cover. On Sprintec and has noted bleeding is improved. She has not had any help with pain. Past Medical History:  Diagnosis Date  . Anxiety   . Depression   . Gestational hypertension 01/2012   also had postpartum preelampsia  . History of stomach ulcers   . Hx of ovarian cyst    x 2-3  . Hx of varicella   . IBS (irritable bowel syndrome)   . Migraine    chronic  . Panic attack   . Plica of knee 08/2012   medial plica left knee   Past Surgical History:  Procedure Laterality Date  . KNEE ARTHROSCOPY  08/17/2012   Procedure: ARTHROSCOPY KNEE;  Surgeon: Harvie JuniorJohn L Graves, MD;  Location: [facility];  Service: Orthopedics;  Laterality: Left;  left knee scope with tight lateral retinacular release and plica removal  . TYMPANOSTOMY TUBE PLACEMENT     OB History    Gravida Para Term Preterm AB Living   3 2 2   1 2    SAB TAB Ectopic Multiple Live Births     1     2     Social History   Socioeconomic History  . Marital status: Married    Spouse name: Will  . Number of children: 2  . Years of education: Not on file  . Highest education level: Not on file  Social Needs  . Financial resource strain: Not on file  . Food insecurity - worry: Not on file  . Food insecurity - inability: Not on file  . Transportation needs - medical: Not on file  . Transportation needs - non-medical: Not on file  Occupational History  . Occupation: Furniture conservator/restorerT Tech    Employer: [employer]  Tobacco Use  . Smoking status: Never Smoker  . Smokeless tobacco: Never Used  Substance and Sexual Activity  . Alcohol use: No    Alcohol/week: 0.0 oz  . Drug use: No  . Sexual activity: Yes    Partners:  Male    Birth control/protection: None, Surgical    Comment: last night  Other Topics Concern  . Not on file  Social History Narrative  . Not on file   Allergies  Allergen Reactions  . Cefuroxime Axetil Rash   Current Outpatient Medications on File Prior to Visit  Medication Sig Dispense Refill  . ALPRAZolam (XANAX) 0.5 MG tablet Take 1 tablet (0.5 mg total) by mouth daily as needed for anxiety. 20 tablet 0  . butalbital-acetaminophen-caffeine (FIORICET, ESGIC) 50-325-40 MG tablet Take by mouth 2 (two) times daily as needed for headache.    . loratadine (CLARITIN) 10 MG tablet Take 10 mg by mouth daily.    Marland Kitchen. PARoxetine (PAXIL) 20 MG tablet Take 1 tablet (20 mg total) by mouth daily. 30 tablet 2  . rizatriptan (MAXALT) 10 MG tablet Take 10 mg by mouth as needed for migraine. May repeat in 2 hours if needed    . diclofenac (VOLTAREN) 75 MG EC tablet Take 1 tablet (75 mg total) by mouth 2 (two) times daily with a meal. (Patient not taking: Reported on 12/11/2017) 60 tablet 2  . promethazine (PHENERGAN) 25 MG tablet  Take 25 mg by mouth every 6 (six) hours as needed for nausea or vomiting.     No current facility-administered medications on file prior to visit.    Family History  Problem Relation Age of Onset  . Hypertension Mother   . Heart disease Mother        MI  . Anesthesia problems Mother        post-op N/V  . Depression Mother   . Anxiety disorder Mother   . Obesity Mother   . Asthma Sister   . Bipolar disorder Sister   . Asthma Brother   . Diabetes Maternal Aunt   . Heart disease Father   . Bipolar disorder Father   . Hyperlipidemia Father     Review of Systems  Constitutional: Negative for chills and fever.  HENT: Positive for sore throat. Negative for congestion, nosebleeds and rhinorrhea.   Eyes: Negative for discharge, redness and visual disturbance.  Respiratory: Negative for cough, chest tightness, shortness of breath and wheezing.   Cardiovascular: Negative  for chest pain and leg swelling.  Gastrointestinal: Negative for abdominal distention, abdominal pain, blood in stool, constipation, diarrhea, nausea and vomiting.  Genitourinary: Negative for dysuria, frequency, hematuria, menstrual problem and vaginal bleeding.  Musculoskeletal: Negative for arthralgias and back pain.  Skin: Negative for rash.  Neurological: Negative for dizziness and headaches.  Psychiatric/Behavioral: Negative for behavioral problems, dysphoric mood and sleep disturbance.  All other systems reviewed and are negative.     Objective:    BP 129/81   Pulse 92   Wt 213 lb 3.2 oz (96.7 kg)   LMP 11/23/2017   BMI 36.60 kg/m  Physical Exam  Constitutional: She is oriented to person, place, and time. She appears well-developed and well-nourished. No distress.  HENT:  Head: Normocephalic and atraumatic.  Eyes: Pupils are equal, round, and reactive to light. No scleral icterus.  Neck: Normal range of motion. Neck supple. No thyromegaly present.  Cardiovascular: Normal rate, regular rhythm and intact distal pulses.  Pulmonary/Chest: Effort normal and breath sounds normal. Right breast exhibits no inverted nipple, no mass and no nipple discharge. Left breast exhibits no inverted nipple, no mass and no nipple discharge.  Abdominal: Soft. She exhibits no distension. There is no tenderness.  Genitourinary:  Genitourinary Comments: BUS normal, vagina is pink and rugated, cervix is multiparous without lesion, uterus is small and anteverted, no adnexal mass or tenderness.   Neurological: She is alert and oriented to person, place, and time.  Skin: Skin is warm and dry.  Psychiatric: She has a normal mood and affect.        Assessment & Plan:   Problem List Items Addressed This Visit      Unprioritized   Pelvic pain    Still having pain--she still wants to pursue hysterectomy. She will look into Obamacare and charity care.      Relevant Medications    norgestimate-ethinyl estradiol (ORTHO-CYCLEN,SPRINTEC,PREVIFEM) 0.25-35 MG-MCG tablet    Other Visit Diagnoses    Screening for cervical cancer    -  Primary   Relevant Orders   Cytology - PAP   Encounter for gynecological examination with abnormal finding          Return in about 3 months (around 03/11/2018).   Reva Bores 12/11/2017 4:25 PM

## 2017-12-14 LAB — CYTOLOGY - PAP
Chlamydia: NEGATIVE
Diagnosis: NEGATIVE
Neisseria Gonorrhea: NEGATIVE

## 2017-12-22 ENCOUNTER — Other Ambulatory Visit: Payer: Self-pay | Admitting: Internal Medicine

## 2017-12-27 ENCOUNTER — Encounter: Payer: Self-pay | Admitting: Internal Medicine

## 2017-12-27 MED FILL — PARoxetine HCL 20 MG TABS: 20 | 30 days supply | Qty: 30 | Fill #0

## 2017-12-28 ENCOUNTER — Encounter: Payer: Self-pay | Admitting: Internal Medicine

## 2018-01-24 MED FILL — PARoxetine HCL 20 MG TABS: 20 | 30 days supply | Qty: 30 | Fill #1

## 2018-02-01 ENCOUNTER — Telehealth: Payer: Self-pay | Admitting: Internal Medicine

## 2018-02-01 NOTE — Telephone Encounter (Signed)
Copied from CRM [id number]. Topic: Quick Communication - See Telephone Encounter >> Feb 01, 2018 12:20 PM Raquel SarnaHayes, Teresa G wrote: Paxile 20 mg  Pt was told in MyChart she needs a Physical to have her Rx refilled.  Pt doesn't have insurance.  She wanted to make sure if she will need the CPE or a reg office visit. Please call pt to let her know and to schedule an appt w/ Nicki Reaperegina Baity.

## 2018-02-02 NOTE — Telephone Encounter (Signed)
We will just do a follow up. When she gets insurance, we can do a physical

## 2018-02-05 ENCOUNTER — Encounter: Payer: Self-pay | Admitting: Internal Medicine

## 2018-02-06 ENCOUNTER — Encounter: Payer: Self-pay | Admitting: Family Medicine

## 2018-02-06 ENCOUNTER — Ambulatory Visit (INDEPENDENT_AMBULATORY_CARE_PROVIDER_SITE_OTHER): Payer: Medicaid Other | Admitting: Family Medicine

## 2018-02-06 DIAGNOSIS — N6012 Diffuse cystic mastopathy of left breast: Secondary | ICD-10-CM

## 2018-02-06 NOTE — Patient Instructions (Signed)
Fibrocystic Breast Changes  Fibrocystic breast changes are changes that can make your breasts swollen or painful. These changes happen when tiny sacs of fluid (cysts) form in the breast. This is a common condition. It does not mean that you have cancer. It usually happens because of hormone changes during a monthly period.  Follow these instructions at home:   Check your breasts after every monthly period. If you do not have monthly periods, check your breasts on the first day of every month. Check for:  ? Soreness.  ? New swelling or puffiness.  ? A change in breast size.  ? A change in a lump that was already there.   Take over-the-counter and prescription medicines only as told by your doctor.   Wear a support or sports bra that fits well. Wear this support especially when you are exercising.   Avoid or have less caffeine, fat, and sugar in what you eat and drink as told by your doctor.  Contact a doctor if:   You have fluid coming from your nipple, especially if the fluid has blood in it.   You have new lumps or bumps in your breast.   Your breast gets puffy, red, and painful.   You have changes in how your breast looks.   Your nipple looks flat or it sinks into your breast.  Get help right away if:   Your breast turns red, and the redness is spreading.  Summary   Fibrocystic breast changes are changes that can make your breasts swollen or painful.   This condition can happen when you have hormone changes during your monthly period.   With this condition, it is important to check your breasts after every monthly period. If you do not have monthly periods, check your breasts on the first day of every month.  This information is not intended to replace advice given to you by your health care provider. Make sure you discuss any questions you have with your health care provider.  Document Released: 11/03/2008 Document Revised: 08/04/2016 Document Reviewed: 08/04/2016  Elsevier Interactive Patient  Education  2017 Elsevier Inc.

## 2018-02-06 NOTE — Progress Notes (Signed)
   Subjective:    Patient ID: Yuma N Keelan is a 22 y.o. female presentFidela Salisburying with Breast Pain  on 02/06/2018  HPI: Here for right sided breast tenderness, heaviness, swelling and warmth. No erythema.  Review of Systems  Constitutional: Negative for chills and fever.  Respiratory: Negative for shortness of breath.   Cardiovascular: Negative for chest pain.  Gastrointestinal: Negative for abdominal pain, nausea and vomiting.  Genitourinary: Negative for dysuria.  Skin: Negative for rash.      Objective:    BP 119/83   Pulse 98   Ht 5\' 4"  (1.626 m)   Wt 213 lb (96.6 kg)   LMP 01/16/2018   BMI 36.56 kg/m  Physical Exam  Constitutional: She is oriented to person, place, and time. She appears well-developed and well-nourished. No distress.  HENT:  Head: Normocephalic and atraumatic.  Eyes: No scleral icterus.  Neck: Neck supple.  Cardiovascular: Normal rate.  Pulmonary/Chest: Effort normal.    Abdominal: Soft.  Neurological: She is alert and oriented to person, place, and time.  Skin: Skin is warm and dry.  Psychiatric: She has a normal mood and affect.        Assessment & Plan:   Problem List Items Addressed This Visit      Unprioritized   Fibrocystic breast changes    Advised if not improvement return for scheduling breast [facility]s [facility] Advised avoid caffeine, fat, sugar. Advised Vitamin E or Evening Primrose oil.        Return in about 2 weeks (around 02/20/2018), or if symptoms worsen or fail to improve.  Total face-to-face time with patient: 15 minutes. Over 50% of encounter was spent on counseling and coordination of care.  Reva Boresanya S Pratt 02/06/2018 4:49 PM

## 2018-02-07 DIAGNOSIS — N6019 Diffuse cystic mastopathy of unspecified breast: Secondary | ICD-10-CM | POA: Insufficient documentation

## 2018-02-07 NOTE — Assessment & Plan Note (Signed)
Advised if not improvement return for scheduling breast u/s [facility] Advised avoid caffeine, fat, sugar. Advised Vitamin E or Evening Primrose oil.

## 2018-02-08 NOTE — Telephone Encounter (Signed)
Left message on voicemail.

## 2018-02-19 ENCOUNTER — Ambulatory Visit: Payer: Medicaid Other | Admitting: Internal Medicine

## 2018-02-21 ENCOUNTER — Encounter: Payer: Self-pay | Admitting: Internal Medicine

## 2018-02-21 ENCOUNTER — Ambulatory Visit: Payer: Self-pay | Admitting: Internal Medicine

## 2018-02-21 VITALS — BP 122/80 | HR 87 | Temp 98.0°F | Wt 216.0 lb

## 2018-02-21 DIAGNOSIS — F329 Major depressive disorder, single episode, unspecified: Secondary | ICD-10-CM

## 2018-02-21 DIAGNOSIS — G43109 Migraine with aura, not intractable, without status migrainosus: Secondary | ICD-10-CM

## 2018-02-21 DIAGNOSIS — F32A Depression, unspecified: Secondary | ICD-10-CM

## 2018-02-21 DIAGNOSIS — F419 Anxiety disorder, unspecified: Secondary | ICD-10-CM

## 2018-02-21 MED ORDER — ALPRAZOLAM 0.5 MG PO TABS: 0.5000 mg | ORAL_TABLET | Freq: Every day | ORAL | 0 refills | Status: DC | PRN

## 2018-02-21 MED ORDER — ONDANSETRON HCL 4 MG PO TABS: 4.0000 mg | ORAL_TABLET | Freq: Three times a day (TID) | ORAL | 2 refills | Status: DC | PRN

## 2018-02-21 MED ORDER — FLUOXETINE HCL 40 MG PO CAPS: 40.0000 mg | ORAL_CAPSULE | Freq: Every day | ORAL | 3 refills | Status: DC

## 2018-02-21 NOTE — Progress Notes (Signed)
Subjective:    Patient ID: [name] Sanchez, female    DOB: [birth year], 22 y.o.   MRN: [id number]  HPI  Pt presents to the clinic today to follow up chronic conditions.  Migraines: She reports her migraines vary depending on her stress levels. She currently has a migraine today. She has tried the Maxalt but vomited about 5 minutes after taking it. She can not take the Phenergan at work because it makes her sleepy. She is requesting an Toradol injection to get rid of her migraine today.  Anxiety and Depression: She does not feel the Paxil is effective and she is very concerned about the weight gain it is causing. She takes Xanax on a very rare basis but would like a refill of that today. She is currently not in therapy. She denies SI/HI.  Review of Systems      Past Medical History:  Diagnosis Date  . Anxiety   . Depression   . Gestational hypertension 01/2012   also had postpartum preelampsia  . History of stomach ulcers   . Hx of ovarian cyst    x 2-3  . Hx of varicella   . IBS (irritable bowel syndrome)   . Migraine    chronic  . Panic attack   . Plica of knee 08/2012   medial plica left knee    Current Outpatient Medications  Medication Sig Dispense Refill  . ALPRAZolam (XANAX) 0.5 MG tablet Take 1 tablet (0.5 mg total) by mouth daily as needed for anxiety. 20 tablet 0  . butalbital-acetaminophen-caffeine (FIORICET, ESGIC) 50-325-40 MG tablet Take by mouth 2 (two) times daily as needed for headache.    . diclofenac (VOLTAREN) 75 MG EC tablet Take 1 tablet (75 mg total) by mouth 2 (two) times daily with a meal. 60 tablet 2  . Erenumab-aooe (AIMOVIG) 70 MG/ML SOAJ Inject into the skin.    Marland Kitchen ketorolac (TORADOL) 10 MG tablet Take by mouth.    . loratadine (CLARITIN) 10 MG tablet Take 10 mg by mouth daily.    . norgestimate-ethinyl estradiol (ORTHO-CYCLEN,SPRINTEC,PREVIFEM) 0.25-35 MG-MCG tablet Take 1 tablet by mouth daily. 3 Package 3  . PARoxetine (PAXIL) 20 MG tablet Take  1 tablet (20 mg total) by mouth daily. MUST SCHEDULE ANNUAL PHYSICAL 30 tablet 1  . promethazine (PHENERGAN) 25 MG tablet Take 25 mg by mouth every 6 (six) hours as needed for nausea or vomiting.    . rizatriptan (MAXALT) 10 MG tablet Take 10 mg by mouth as needed for migraine. May repeat in 2 hours if needed     No current facility-administered medications for this visit.     Allergies  Allergen Reactions  . Cefuroxime Axetil Rash    Family History  Problem Relation Age of Onset  . Hypertension Mother   . Heart disease Mother        MI  . Anesthesia problems Mother        post-op N/V  . Depression Mother   . Anxiety disorder Mother   . Obesity Mother   . Asthma Sister   . Bipolar disorder Sister   . Asthma Brother   . Diabetes Maternal Aunt   . Heart disease Father   . Bipolar disorder Father   . Hyperlipidemia Father     Social History   Socioeconomic History  . Marital status: Married    Spouse name: Will  . Number of children: 2  . Years of education: Not on file  . Highest education  level: Not on file  Social Needs  . Financial resource strain: Not on file  . Food insecurity - worry: Not on file  . Food insecurity - inability: Not on file  . Transportation needs - medical: Not on file  . Transportation needs - non-medical: Not on file  Occupational History  . Occupation: Furniture conservator/restorerT Tech    Employer: [employer]  Tobacco Use  . Smoking status: Never Smoker  . Smokeless tobacco: Never Used  Substance and Sexual Activity  . Alcohol use: No    Alcohol/week: 0.0 oz  . Drug use: No  . Sexual activity: Yes    Partners: Male    Birth control/protection: None, Surgical    Comment: last night  Other Topics Concern  . Not on file  Social History Narrative  . Not on file     Constitutional: Pt reports headache. Denies fever, malaise, fatigue, or abrupt weight changes.  HEENT: Denies eye pain, eye redness, ear pain, ringing in the ears, wax buildup, runny nose,  nasal congestion, bloody nose, or sore throat. Neurological: Denies dizziness, difficulty with memory, difficulty with speech or problems with balance and coordination.  Psych: Pt reports anxiety and depression. Denies SI/HI.  No other specific complaints in a complete review of systems (except as listed in HPI above). Objective:   Physical Exam    BP 122/80   Pulse 87   Temp 98 F (36.7 C) (Oral)   Wt 216 lb (98 kg)   LMP 02/15/2018   SpO2 98%   BMI 37.08 kg/m  Wt Readings from Last 3 Encounters:  02/21/18 216 lb (98 kg)  02/06/18 213 lb (96.6 kg)  12/11/17 213 lb 3.2 oz (96.7 kg)    General: Appears her stated age, obese in NAD. HEENT: Head: normal shape and size; Eyes: sclera white, no icterus, conjunctiva pink, PERRLA and EOMs intact; Ears: Tm's gray and intact, normal light reflex; Cardiovascular: Normal rate and rhythm.  Pulmonary/Chest: Normal effort and positive vesicular breath sounds. No respiratory distress. No wheezes, rales or ronchi noted.  Neurological: Alert and oriented. Coordination normal.  Psychiatric: Mood and affect normal. Behavior is normal. Judgment and thought content normal.    BMET    Component Value Date/Time   NA 139 05/24/2017 1027   K 4.6 05/24/2017 1027   CL 99 05/24/2017 1027   CO2 24 05/24/2017 1027   GLUCOSE 84 05/24/2017 1027   GLUCOSE 91 06/22/2015 1541   BUN 14 05/24/2017 1027   CREATININE 0.66 05/24/2017 1027   CALCIUM 9.6 05/24/2017 1027   GFRNONAA 126 05/24/2017 1027   GFRAA 145 05/24/2017 1027    Lipid Panel     Component Value Date/Time   CHOL 168 05/24/2017 1027   TRIG 76 05/24/2017 1027   HDL 59 05/24/2017 1027   CHOLHDL 3 04/04/2012 1410   VLDL 12.4 04/04/2012 1410   LDLCALC 94 05/24/2017 1027    CBC    Component Value Date/Time   WBC 5.0 05/24/2017 1027   WBC 6.9 06/22/2015 1541   RBC 4.36 05/24/2017 1027   RBC 4.01 06/22/2015 1541   HGB 13.3 05/24/2017 1027   HCT 41.2 05/24/2017 1027   PLT 215.0  06/22/2015 1541   PLT 206 11/17/2014 1441   MCV 95 05/24/2017 1027   MCV 95 11/17/2014 1441   MCH 30.5 05/24/2017 1027   MCH 31.4 11/17/2014 1441   MCH 32.8 09/19/2014 0605   MCHC 32.3 05/24/2017 1027   MCHC 34.0 06/22/2015 1541  RDW 13.3 05/24/2017 1027   RDW 12.7 11/17/2014 1441   LYMPHSABS 1.6 05/24/2017 1027   LYMPHSABS 1.5 11/17/2014 1441   MONOABS 0.5 06/22/2015 1541   MONOABS 0.9 11/17/2014 1441   EOSABS 0.1 05/24/2017 1027   EOSABS 0.1 11/17/2014 1441   BASOSABS 0.0 05/24/2017 1027   BASOSABS 0.0 11/17/2014 1441    Hgb A1C Lab Results  Component Value Date   HGBA1C 5.1 05/24/2017          Assessment & Plan:

## 2018-02-24 NOTE — Assessment & Plan Note (Signed)
Will stop Paxil eRx for Fluoxetine 40 mg daily Xanax refilled today Support offered today  Advised her to make an appt for her annual exam

## 2018-02-24 NOTE — Patient Instructions (Signed)

## 2018-02-24 NOTE — Assessment & Plan Note (Signed)
She will continue Maxalt prn Stop Phenergan eRx for Zofran 4 mg TID prn 30 mg Toradol for acute migraine today

## 2018-04-03 ENCOUNTER — Ambulatory Visit: Payer: Medicaid Other | Admitting: Family Medicine

## 2018-04-13 ENCOUNTER — Encounter: Payer: Self-pay | Admitting: Family Medicine

## 2018-04-16 ENCOUNTER — Other Ambulatory Visit: Payer: Self-pay

## 2018-04-16 ENCOUNTER — Emergency Department (HOSPITAL_COMMUNITY)
Admission: EM | Admit: 2018-04-16 | Discharge: 2018-04-17 | Disposition: A | Discharge status: Home / Self Care | Payer: Self-pay | Attending: Emergency Medicine | Admitting: Emergency Medicine

## 2018-04-16 ENCOUNTER — Encounter (HOSPITAL_COMMUNITY): Payer: Self-pay | Admitting: Emergency Medicine

## 2018-04-16 DIAGNOSIS — R070 Pain in throat: Secondary | ICD-10-CM | POA: Insufficient documentation

## 2018-04-16 DIAGNOSIS — Z79899 Other long term (current) drug therapy: Secondary | ICD-10-CM | POA: Insufficient documentation

## 2018-04-16 DIAGNOSIS — R079 Chest pain, unspecified: Secondary | ICD-10-CM | POA: Insufficient documentation

## 2018-04-16 DIAGNOSIS — J020 Streptococcal pharyngitis: Secondary | ICD-10-CM | POA: Insufficient documentation

## 2018-04-16 NOTE — ED Triage Notes (Signed)
Pt was seen at [facility] urgent care  Pt has had fever, ear/throat/neck pain and body aches  Temp was 99.6 with motrin  Pt was diagnosed with strep on April 27th and took 10 days of Augmentin and took prednisone  Pt today is c/o pain with swallowing  Pt has had positive strep on 1/23-2/6-and 4/27  Urgent care sent here for concern for why strep is not being treated by Augmentin  Pt WBC today of 18.7  Pt having chills and sweats

## 2018-04-17 ENCOUNTER — Emergency Department (HOSPITAL_COMMUNITY): Payer: Self-pay

## 2018-04-17 LAB — GROUP A STREP BY PCR: GROUP A STREP BY PCR: DETECTED — AB

## 2018-04-17 MED ORDER — DEXAMETHASONE SODIUM PHOSPHATE 10 MG/ML IJ SOLN
10.0000 mg | Freq: Once | INTRAMUSCULAR | Status: DC
  Filled 2018-04-17: qty 1

## 2018-04-17 MED ORDER — CLINDAMYCIN HCL 150 MG PO CAPS
450.0000 mg | ORAL_CAPSULE | Freq: Four times a day (QID) | ORAL | 0 refills | Status: AC
Start: 2018-04-17 — End: 2018-04-24

## 2018-04-17 MED ORDER — ACETAMINOPHEN ER 650 MG PO TBCR: 650.0000 mg | EXTENDED_RELEASE_TABLET | Freq: Three times a day (TID) | ORAL | 0 refills | Status: DC | PRN

## 2018-04-17 MED ORDER — DEXAMETHASONE SODIUM PHOSPHATE 10 MG/ML IJ SOLN
10.0000 mg | Freq: Once | INTRAMUSCULAR | Status: AC
  Administered 2018-04-17: 10 mg via INTRAMUSCULAR

## 2018-04-17 MED ORDER — IBUPROFEN 600 MG PO TABS: 600.0000 mg | ORAL_TABLET | Freq: Four times a day (QID) | ORAL | 0 refills | Status: DC | PRN

## 2018-04-17 MED ORDER — NAPROXEN 500 MG PO TABS
500.0000 mg | ORAL_TABLET | Freq: Once | ORAL | Status: AC
  Administered 2018-04-17: 500 mg via ORAL
  Filled 2018-04-17: qty 1

## 2018-04-17 MED ORDER — CLINDAMYCIN HCL 300 MG PO CAPS
450.0000 mg | ORAL_CAPSULE | Freq: Once | ORAL | Status: AC
  Administered 2018-04-17: 450 mg via ORAL
  Filled 2018-04-17: qty 1

## 2018-04-17 NOTE — ED Provider Notes (Signed)
[facility]-EMERGENCY DEPT Provider Note   CSN: [id number] Arrival date & time: 04/16/18  1949     History   Chief Complaint Chief Complaint  Patient presents with  . Fever  . Sore Throat    HPI [name] Sanchez is a 22 y.o. female.  HPI 22 year old female comes in with chief complaint of fever and sore throat. Patient states that she has had recurrent strep infection this year.  She had strep infection in January again in March and then April.  Her last episode was 4/27 and patient was started on Augmentin.  Despite the antibiotics, patient's sore throat has persisted.  Patient denies any family history of immunosuppression or personal history of recurrent infection that has required additional work-up.  Patient's symptoms are pain in the throat, difficulty swallowing, and pressure in her ears.  Patient has been taking Tylenol and ibuprofen for her pain. Pt was also given prednisone with her antibiotics.  Patient also has been having fevers, chills, sweats.  No unexplained weight loss. She went to the urgent care today because her pain had persisted despite the antibiotic course, and they noted that her white count has jumped to 18 -and so patient was advised to come to the ER.  Review of system is also positive for pain in the back and chest discomfort.  The location of those pains are mid torso.  Patient does endorse some pleuritic component to this pain.  Patient takes oral contraceptives otherwise she does not have any PE risk factors or any history of DVT-PE.  Patient denies any exertional shortness of breath, dizziness or lightheadedness.  Past Medical History:  Diagnosis Date  . Anxiety   . Depression   . Gestational hypertension 01/2012   also had postpartum preelampsia  . History of stomach ulcers   . Hx of ovarian cyst    x 2-3  . Hx of varicella   . IBS (irritable bowel syndrome)   . Migraine    chronic  . Panic attack   . Plica of knee 08/2012     medial plica left knee    Patient Active Problem List   Diagnosis Date Noted  . Anxiety and depression 04/04/2012  . Migraine with aura     Past Surgical History:  Procedure Laterality Date  . KNEE ARTHROSCOPY  08/17/2012   Procedure: ARTHROSCOPY KNEE;  Surgeon: Harvie Junior, MD;  Location: [facility];  Service: Orthopedics;  Laterality: Left;  left knee scope with tight lateral retinacular release and plica removal  . TYMPANOSTOMY TUBE PLACEMENT       OB History    Gravida  3   Para  2   Term  2   Preterm      AB  1   Living  2     SAB      TAB  1   Ectopic      Multiple      Live Births  2            Home Medications    Prior to Admission medications   Medication Sig Start Date End Date Taking? Authorizing Provider  FLUoxetine (PROZAC) 40 MG capsule Take 1 capsule (40 mg total) by mouth daily. 02/21/18  Yes Baity, Salvadore Oxford, NP  ibuprofen (ADVIL,MOTRIN) 200 MG tablet Take 600 mg by mouth every 6 (six) hours as needed for moderate pain.   Yes [provider]  ALPRAZolam Prudy Feeler) 0.5 MG tablet Take 1  tablet (0.5 mg total) by mouth daily as needed for anxiety. Patient not taking: Reported on 04/16/2018 02/21/18   Lorre Munroe, NP  norgestimate-ethinyl estradiol (ORTHO-CYCLEN,SPRINTEC,PREVIFEM) 0.25-35 MG-MCG tablet Take 1 tablet by mouth daily. Patient not taking: Reported on 04/16/2018 12/11/17   Reva Bores, MD  ondansetron (ZOFRAN) 4 MG tablet Take 1 tablet (4 mg total) by mouth every 8 (eight) hours as needed. Patient not taking: Reported on 04/16/2018 02/21/18   Lorre Munroe, NP    Family History Family History  Problem Relation Age of Onset  . Hypertension Mother   . Heart disease Mother        MI  . Anesthesia problems Mother        post-op N/V  . Depression Mother   . Anxiety disorder Mother   . Obesity Mother   . Asthma Sister   . Bipolar disorder Sister   . Asthma Brother   . Diabetes Maternal Aunt   .  Heart disease Father   . Bipolar disorder Father   . Hyperlipidemia Father     Social History Social History   Tobacco Use  . Smoking status: Never Smoker  . Smokeless tobacco: Never Used  Substance Use Topics  . Alcohol use: No    Alcohol/week: 0.0 oz  . Drug use: No     Allergies   Cefuroxime axetil   Review of Systems Review of Systems  Constitutional: Positive for activity change.  HENT: Positive for sore throat. Negative for trouble swallowing.   Respiratory: Negative for shortness of breath.   Cardiovascular: Positive for chest pain.  Musculoskeletal: Positive for back pain.  Allergic/Immunologic: Negative for immunocompromised state.  Hematological: Does not bruise/bleed easily.  All other systems reviewed and are negative.    Physical Exam Updated Vital Signs BP (!) 133/93   Pulse 100   Temp 98.3 F (36.8 C) (Oral)   Resp 20   Ht  (1.626 m)   Wt 94.3 kg (208 lb)   LMP 04/14/2018 (Exact Date)   SpO2 100%   BMI 35.70 kg/m   Physical Exam  Constitutional: She is oriented to person, place, and time. She appears well-developed. She does not appear ill. She appears distressed.  Distress due to pain  HENT:  Head: Normocephalic and atraumatic.  Mouth/Throat: Uvula is midline. No uvula swelling. Posterior oropharyngeal edema present. No oropharyngeal exudate or tonsillar abscesses.  No trismus, no stridor.  Eyes: EOM are normal.  Neck: Normal range of motion. Neck supple.  Cardiovascular: Normal rate.  Pulmonary/Chest: Effort normal.  Abdominal: Bowel sounds are normal.  Neurological: She is alert and oriented to person, place, and time.  Skin: Skin is warm and dry.  Nursing note and vitals reviewed.    ED Treatments / Results  Labs (all labs ordered are listed, but only abnormal results are displayed) Labs Reviewed  CULTURE, BETA STREP (GROUP B ONLY)    EKG None  Radiology No results found.  Procedures Procedures (including  critical care time)  Medications Ordered in ED Medications  clindamycin (CLEOCIN) capsule 450 mg (has no administration in time range)  dexamethasone (DECADRON) injection 10 mg (has no administration in time range)  naproxen (NAPROSYN) tablet 500 mg (has no administration in time range)     Initial Impression / Assessment and Plan / ED Course  I have reviewed the triage vital signs and the nursing notes.  Pertinent labs & imaging results that were available during my care of the patient were reviewed  by me and considered in my medical decision making (see chart for details).     22 year old female comes in with chief complaint of sore throat.  Patient has been suffering recurrent strep infection.  She just finished a course of Augmentin and prednisone for her strep pharyngitis that was diagnosed on 4-27 -however her symptoms have persisted.  Review of system also includes fevers, chills, night sweats.  Patient does not have previous history of recurrent URI infections and there is no history of immunosuppression in the family.  Patient's exam overall appears benign.  There are no hard signs to indicate deep space abscess.  Specifically patient does not have trismus, stridor and she is not toxic appearing.  I do not think a CT soft tissue neck is indicated at this time from emergency perspective.  It might be better for [place] to change the regimen to clindamycin.  Patient's elevated white count could be due to prednisone.  So we will not put a lot of weight on the white count at this time.  It appears that patient has seen ENT in the past.  I think it might be worthwhile for her to reconnect with a ENT to see if there is any structural abnormalities that is leading to recurrent strep infections.  Also, we will give patient follow-up with infectious disease.  Additionally, patient states that she is having some back pain.  The pain is also present in her chest, at the mid torso level.  Patient is  on oral contraceptives.  We will get an EKG and chest x-ray.  Patient is not having any dizziness, lightheadedness or exertional shortness of breath.  I do not think we need to undertake PE work-up.  Screening test for now are the best along with strict ER return precautions for a PE.  Final Clinical Impressions(s) / ED Diagnoses   Final diagnoses:  None    ED Discharge Orders    None       Derwood Kaplan, MD 04/17/18 0028

## 2018-04-17 NOTE — Discharge Instructions (Signed)
We saw you in the ER for the sore throat and chest discomfort.  Please start taking clindamycin for your strep infection. If your symptoms get worse, you start having severe pain, are unable to open your mouth, start having difficulty in breathing, get confused or start drooling come back to the ER right away.  Also as discussed, if you start having shortness of breath with exertion, chest pain with deep inspiration, dizziness or fainting come back to the ER for evaluation for PE.  Given that you are having frequent infection which is not common, we recommend that you follow-up with ENT doctor to ensure there is no structural problems. Follow-up with the infectious disease doctors if our antibiotics are not helping.

## 2018-04-17 NOTE — ED Notes (Signed)
Patient transported to X-ray 

## 2018-04-18 ENCOUNTER — Telehealth: Payer: Self-pay | Admitting: Emergency Medicine

## 2018-04-18 NOTE — Telephone Encounter (Signed)
Post ED Visit - Positive Culture Follow-up: Successful Patient Follow-Up  Culture assessed and recommendations reviewed by:  Enzo Bi, Pharm.D.  Celedonio Miyamoto, Pharm.D., BCPS AQ-ID  Garvin Fila, Pharm.D., BCPS  Georgina Pillion, Pharm.D., BCPS  [place], [place].D., BCPS, AAHIVP  Estella Husk, Pharm.D., BCPS, AAHIVP  Lysle Pearl, PharmD, BCPS  Sherlynn Carbon, PharmD  Pollyann Samples, PharmD, BCPS  Positive strep culture   Patient discharged without antimicrobial prescription and treatment is now indicated  Organism is resistant to prescribed ED discharge antimicrobial  Patient with positive blood cultures  Changes discussed with ED provider: Leonia Corona PA New antibiotic prescription decrease dosage of Clindamycin from three caps q 6 hours to two caps every 8 hours, discard leftover caps  04/18/18 1302   Berle Mull 04/18/2018, 1:00 PM

## 2018-04-20 ENCOUNTER — Encounter: Payer: Self-pay | Admitting: Internal Medicine

## 2018-05-01 ENCOUNTER — Ambulatory Visit: Payer: Medicaid Other | Admitting: Internal Medicine

## 2018-05-02 ENCOUNTER — Encounter: Payer: Self-pay | Admitting: Internal Medicine

## 2018-05-02 ENCOUNTER — Telehealth: Payer: Self-pay

## 2018-05-02 NOTE — Telephone Encounter (Signed)
Copied from CRM [id number]. Topic: Appointment Scheduling - Prior Auth Required for Appointment >> May 02, 2018  3:52 PM [name] Sanchez wrote: Patient requested a annual physical to be done by July 1st due to her insurance increasing her premiums if not done by then. [name] Sanchez scheduled her an appointment on 5/31 due to a cancellation. She called back after the appointment was made and said her insurance does not go into effect until 05/05/18. I spoke with [name] Sanchez that advised that this appointment would need to be approved by [name] Sanchez. I did not see anything available for a physical in the month of June. Please call patient if she can be worked in @ [phone]

## 2018-05-02 NOTE — Telephone Encounter (Signed)
Its fine, just put her in a 30 minute slot, preferably not back to back with another CPX or AWV

## 2018-05-04 ENCOUNTER — Encounter: Payer: Medicaid Other | Admitting: Internal Medicine

## 2018-05-08 NOTE — Telephone Encounter (Signed)
Sch 05/24/18. Pt is aware.

## 2018-05-21 ENCOUNTER — Ambulatory Visit: Payer: Medicaid Other | Admitting: Internal Medicine

## 2018-05-24 ENCOUNTER — Encounter: Payer: Medicaid Other | Admitting: Internal Medicine

## 2018-05-24 NOTE — Progress Notes (Deleted)
Subjective:    Patient ID: [name] Sanchez, female    DOB: 1996-07-03, 22 y.o.   MRN: [id number]  HPI  Pt presents to the clinic today for her annual exam.  Flu: Tetanus: Pap Smear: Dentist:  Diet: Exercise:  Review of Systems      Past Medical History:  Diagnosis Date  . Anxiety   . Depression   . Gestational hypertension 01/2012   also had postpartum preelampsia  . History of stomach ulcers   . Hx of ovarian cyst    x 2-3  . Hx of varicella   . IBS (irritable bowel syndrome)   . Migraine    chronic  . Panic attack   . Plica of knee 08/2012   medial plica left knee    Current Outpatient Medications  Medication Sig Dispense Refill  . acetaminophen (TYLENOL 8 HOUR) 650 MG CR tablet Take 1 tablet (650 mg total) by mouth every 8 (eight) hours as needed. 30 tablet 0  . ALPRAZolam (XANAX) 0.5 MG tablet Take 1 tablet (0.5 mg total) by mouth daily as needed for anxiety. (Patient not taking: Reported on 04/16/2018) 20 tablet 0  . FLUoxetine (PROZAC) 40 MG capsule Take 1 capsule (40 mg total) by mouth daily. 90 capsule 3  . ibuprofen (ADVIL,MOTRIN) 600 MG tablet Take 1 tablet (600 mg total) by mouth every 6 (six) hours as needed. 30 tablet 0  . norgestimate-ethinyl estradiol (ORTHO-CYCLEN,SPRINTEC,PREVIFEM) 0.25-35 MG-MCG tablet Take 1 tablet by mouth daily. (Patient not taking: Reported on 04/16/2018) 3 Package 3  . ondansetron (ZOFRAN) 4 MG tablet Take 1 tablet (4 mg total) by mouth every 8 (eight) hours as needed. (Patient not taking: Reported on 04/16/2018) 20 tablet 2   No current facility-administered medications for this visit.     Allergies  Allergen Reactions  . Cefuroxime Axetil Rash    Family History  Problem Relation Age of Onset  . Hypertension Mother   . Heart disease Mother        MI  . Anesthesia problems Mother        post-op N/V  . Depression Mother   . Anxiety disorder Mother   . Obesity Mother   . Asthma Sister   . Bipolar disorder Sister     . Asthma Brother   . Diabetes Maternal Aunt   . Heart disease Father   . Bipolar disorder Father   . Hyperlipidemia Father     Social History   Socioeconomic History  . Marital status: Married    Spouse name: Will  . Number of children: 2  . Years of education: Not on file  . Highest education level: Not on file  Occupational History  . Occupation: Furniture conservator/restorerT Tech    Employer: [employer]  Social Needs  . Financial resource strain: Not on file  . Food insecurity:    Worry: Not on file    Inability: Not on file  . Transportation needs:    Medical: Not on file    Non-medical: Not on file  Tobacco Use  . Smoking status: Never Smoker  . Smokeless tobacco: Never Used  Substance and Sexual Activity  . Alcohol use: No    Alcohol/week: 0.0 oz  . Drug use: No  . Sexual activity: Yes    Partners: Male    Birth control/protection: None, Surgical    Comment: last night  Lifestyle  . Physical activity:    Days per week: Not on file    Minutes per  session: Not on file  . Stress: Not on file  Relationships  . Social connections:    Talks on phone: Not on file    Gets together: Not on file    Attends religious service: Not on file    Active member of club or organization: Not on file    Attends meetings of clubs or organizations: Not on file    Relationship status: Not on file  . Intimate partner violence:    Fear of current or ex partner: Not on file    Emotionally abused: Not on file    Physically abused: Not on file    Forced sexual activity: Not on file  Other Topics Concern  . Not on file  Social History Narrative  . Not on file     Constitutional: Denies fever, malaise, fatigue, headache or abrupt weight changes.  HEENT: Denies eye pain, eye redness, ear pain, ringing in the ears, wax buildup, runny nose, nasal congestion, bloody nose, or sore throat. Respiratory: Denies difficulty breathing, shortness of breath, cough or sputum production.   Cardiovascular:  Denies chest pain, chest tightness, palpitations or swelling in the hands or feet.  Gastrointestinal: Denies abdominal pain, bloating, constipation, diarrhea or blood in the stool.  GU: Denies urgency, frequency, pain with urination, burning sensation, blood in urine, odor or discharge. Musculoskeletal: Denies decrease in range of motion, difficulty with gait, muscle pain or joint pain and swelling.  Skin: Denies redness, rashes, lesions or ulcercations.  Neurological: Denies dizziness, difficulty with memory, difficulty with speech or problems with balance and coordination.  Psych: Denies anxiety, depression, SI/HI.  No other specific complaints in a complete review of systems (except as listed in HPI above).  Objective:   Physical Exam        Assessment & Plan:

## 2018-05-30 ENCOUNTER — Telehealth: Payer: Self-pay

## 2018-05-30 NOTE — Telephone Encounter (Signed)
Fine with me

## 2018-05-30 NOTE — Telephone Encounter (Deleted)
Okay with me. Please call to see if she needs a quick appt to get the [facility] CPE.

## 2018-05-30 NOTE — Telephone Encounter (Signed)
I decline.  Helane RimaErica Andreina Outten DO

## 2018-05-30 NOTE — Telephone Encounter (Signed)
Copied from CRM [id number]. Topic: Appointment Scheduling - Scheduling Inquiry for Clinic >> May 30, 2018 10:29 AM Arlyss Gandyichardson, Taren N, NT wrote: Reason for CRM: [facility] Earlene PlaterWallace for location.

## 2018-05-30 NOTE — Telephone Encounter (Signed)
I left msg for pt advising her the transfer was declined.

## 2018-07-02 ENCOUNTER — Encounter (HOSPITAL_COMMUNITY): Payer: Self-pay | Admitting: Emergency Medicine

## 2018-07-02 ENCOUNTER — Ambulatory Visit (INDEPENDENT_AMBULATORY_CARE_PROVIDER_SITE_OTHER): Payer: 59

## 2018-07-02 ENCOUNTER — Ambulatory Visit (HOSPITAL_COMMUNITY)
Admission: EM | Admit: 2018-07-02 | Discharge: 2018-07-02 | Disposition: A | Discharge status: Home / Self Care | Payer: 59 | Attending: Family Medicine | Admitting: Family Medicine

## 2018-07-02 DIAGNOSIS — R110 Nausea: Secondary | ICD-10-CM | POA: Diagnosis not present

## 2018-07-02 DIAGNOSIS — R0781 Pleurodynia: Secondary | ICD-10-CM

## 2018-07-02 DIAGNOSIS — R071 Chest pain on breathing: Secondary | ICD-10-CM | POA: Diagnosis not present

## 2018-07-02 DIAGNOSIS — R0789 Other chest pain: Secondary | ICD-10-CM

## 2018-07-02 MED ORDER — KETOROLAC TROMETHAMINE 60 MG/2ML IM SOLN
INTRAMUSCULAR | Status: AC
  Filled 2018-07-02: qty 2

## 2018-07-02 MED ORDER — ONDANSETRON 4 MG PO TBDP
ORAL_TABLET | ORAL | Status: AC
  Filled 2018-07-02: qty 1

## 2018-07-02 MED ORDER — ONDANSETRON 4 MG PO TBDP
4.0000 mg | ORAL_TABLET | Freq: Once | ORAL | Status: AC
  Administered 2018-07-02: 4 mg via ORAL

## 2018-07-02 MED ORDER — KETOROLAC TROMETHAMINE 60 MG/2ML IM SOLN
60.0000 mg | Freq: Once | INTRAMUSCULAR | Status: AC
  Administered 2018-07-02: 60 mg via INTRAMUSCULAR

## 2018-07-02 MED ORDER — NAPROXEN 500 MG PO TABS: 500.0000 mg | ORAL_TABLET | Freq: Two times a day (BID) | ORAL | 0 refills | Status: DC

## 2018-07-02 NOTE — Discharge Instructions (Signed)
Ekg is normal today. Chest xray evaluating ribs is normal.  This appears to be likely related to inflammation, will treat with naproxen twice a day. Start tonight with first dose. Take with food.  If symptoms not improve in the next week follow up with your PCP.   If develop worsening of pain, shortness of breath , difficulty breathing, dizziness, feeling like about to pass out or otherwise worsening please go to the Er.

## 2018-07-02 NOTE — ED Triage Notes (Signed)
PT has a swollen area over left ribs. PT has had this for years, but it is much larger than it used to be. PT reports pain over ribs and stabbing chest pain that she associates with swollen area. PT has had this swelling since she was 11.

## 2018-07-02 NOTE — ED Provider Notes (Signed)
[facility]    CSN: [id number] Arrival date & time: 07/02/18  [id number]     History   Chief Complaint Chief Complaint  Patient presents with  . Chest Pain    HPI [name] Sanchez is a 22 y.o. female.   [name] Sanchez presents with complaints of left sided chest pain and rib pain which has worsened over the past week. Stabbing pain. Worse with certain movements. Also feels that there is increased pain with wearing an under wire bra as well. Feels like her heart is racing and some shortness of breath, for the past 5 days. Nausea, vomited on 7/25. States she has intermittent episodes of nausea and vomiting for months now which last just for about at day. No other abdominal pain but feels "full." took aleve 7/25 which did not help with her symptoms, tylenol last night which did not help. Denies any previous similar episodes. Notes and area to left anterior distal ribs which she has felt since she was age 22 which has caused some pain for her, feels this area has enlarged. 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 She is not on birth control, no recent travel or hospitalization, no history of blood clot, does not smoke, no leg pain or swelling. No cardiac history. Mother with history of CHF, and had MI in her 3640's. Hx of anxiety, depression, migraines, ibs.  Per chart review ER visit on 04/16/18 also with chest pain with negative evaluation at that time.     ROS per HPI.      Past Medical History:  Diagnosis Date  . Anxiety   . Depression   . Gestational hypertension 01/2012   also had postpartum preelampsia  . History of stomach ulcers   . Hx of ovarian cyst    x 2-3  . Hx of varicella   . IBS (irritable bowel syndrome)   . Migraine    chronic  . Panic attack   . Plica of knee 08/2012   medial plica left knee    Patient Active Problem List   Diagnosis Date Noted  . Anxiety and depression 04/04/2012  . Migraine  with aura     Past Surgical History:  Procedure Laterality Date  . KNEE ARTHROSCOPY  08/17/2012   Procedure: ARTHROSCOPY KNEE;  Surgeon: Harvie JuniorJohn L Graves, MD;  Location: [facility];  Service: Orthopedics;  Laterality: Left;  left knee scope with tight lateral retinacular release and plica removal  . TYMPANOSTOMY TUBE PLACEMENT      OB History    Gravida  3   Para  2   Term  2   Preterm      AB  1   Living  2     SAB      TAB  1   Ectopic      Multiple      Live Births  2            Home Medications    Prior to Admission medications   Medication Sig Start Date End Date Taking? Authorizing Provider  FLUoxetine (PROZAC) 40 MG capsule Take 1 capsule (40 mg total) by mouth daily. 02/21/18  Yes Baity, Salvadore Oxfordegina W, NP  ibuprofen (ADVIL,MOTRIN) 600 MG tablet Take 1 tablet (600 mg total) by mouth every 6 (six) hours as needed. 04/17/18  Yes Derwood KaplanNanavati, Ankit, MD  acetaminophen (TYLENOL 8 HOUR) 650 MG CR tablet Take  1 tablet (650 mg total) by mouth every 8 (eight) hours as needed. 04/17/18   Derwood Kaplan, MD  ALPRAZolam Prudy Feeler) 0.5 MG tablet Take 1 tablet (0.5 mg total) by mouth daily as needed for anxiety. Patient not taking: Reported on 04/16/2018 02/21/18   Lorre Munroe, NP  naproxen (NAPROSYN) 500 MG tablet Take 1 tablet (500 mg total) by mouth 2 (two) times daily. 07/02/18   Georgetta Haber, NP    Family History Family History  Problem Relation Age of Onset  . Hypertension Mother   . Heart disease Mother        MI  . Anesthesia problems Mother        post-op N/V  . Depression Mother   . Anxiety disorder Mother   . Obesity Mother   . Asthma Sister   . Bipolar disorder Sister   . Asthma Brother   . Diabetes Maternal Aunt   . Heart disease Father   . Bipolar disorder Father   . Hyperlipidemia Father     Social History Social History   Tobacco Use  . Smoking status: Never Smoker  . Smokeless tobacco: Never Used  Substance Use Topics  .  Alcohol use: No    Alcohol/week: 0.0 oz  . Drug use: No     Allergies   Cefuroxime axetil   Review of Systems Review of Systems   Physical Exam Triage Vital Signs ED Triage Vitals  Enc Vitals Group     BP 07/02/18 0915 (!) 144/82     Pulse Rate 07/02/18 0915 (!) 101     Resp 07/02/18 0915 16     Temp 07/02/18 0915 98.5 F (36.9 C)     Temp Source 07/02/18 0915 Oral     SpO2 07/02/18 0915 100 %     Weight --      Height --      Head Circumference --      Peak Flow --      Pain Score 07/02/18 0916 8     Pain Loc --      Pain Edu? --      Excl. in GC? --    No data found.  Updated Vital Signs BP (!) 144/82   Pulse (!) 101   Temp 98.5 F (36.9 C) (Oral)   Resp 16   LMP 06/16/2018   SpO2 100%    Physical Exam  Constitutional: She is oriented to person, place, and time. She appears well-developed and well-nourished.  Non-toxic appearance. No distress.  HENT:  Head: Normocephalic and atraumatic.  Cardiovascular: Normal rate, regular rhythm, normal heart sounds and normal pulses.  Pulmonary/Chest: Effort normal and breath sounds normal. She has no decreased breath sounds.  Palpable enlarged area to left distal anterior ribs; tender; no redness, no fluctuance. Rubbery; approximately 4 inches in length and 2 inches in diameter  No increased work of breathing or tachypnea noted, non distressed     Neurological: She is alert and oriented to person, place, and time.  Skin: Skin is warm and dry.   EKG NSR rate 92, without acute changes noted.   UC Treatments / Results  Labs (all labs ordered are listed, but only abnormal results are displayed) Labs Reviewed - No data to display  EKG None  Radiology Dg Ribs Unilateral W/chest Left  Result Date: 07/02/2018 CLINICAL DATA:  Chronic swelling over the LEFT ribs, pain increased over the past week. Pain increases with inspiration. No injury. EXAM: LEFT RIBS AND CHEST -  3+ VIEW COMPARISON:  Chest x-ray dated  04/17/2018. FINDINGS: Single-view of the chest and two views of the LEFT ribs are provided. Heart size and mediastinal contours are within normal limits. Lungs are clear. No pleural effusion or pneumothorax seen. Osseous structures about the chest are unremarkable. No LEFT-sided rib fracture or displacement. No acute or suspicious osseous lesion. Soft tissues about the chest are unremarkable. IMPRESSION: Negative. Electronically Signed   By: Bary Richard M.D.   On: 07/02/2018 10:02    Procedures Procedures (including critical care time)  Medications Ordered in UC Medications  ketorolac (TORADOL) injection 60 mg (60 mg Intramuscular Given 07/02/18 1001)  ondansetron (ZOFRAN-ODT) disintegrating tablet 4 mg (4 mg Oral Given 07/02/18 0955)    Initial Impression / Assessment and Plan / UC Course  I have reviewed the triage vital signs and the nursing notes.  Pertinent labs & imaging results that were available during my care of the patient were reviewed by me and considered in my medical decision making (see chart for details).     EKG reassuring. HR in 90's. Non toxic. Chest/rib films normal today. No other PE risk factors. No previous cardiac history. toradol provided today in clinic, naproxen to start at home for chest wall pain. Reproducible with palpation. Mass to ribs, has had for years, likely lipoma? Question some anxiety possibly contributing to symptoms as well? Encouraged close follow up with PCP for recheck. Return precautions provided. Patient verbalized understanding and agreeable to plan.  Ambulatory out of clinic without difficulty.    Final Clinical Impressions(s) / UC Diagnoses   Final diagnoses:  Chest wall pain     Discharge Instructions     Ekg is normal today. Chest xray evaluating ribs is normal.  This appears to be likely related to inflammation, will treat with naproxen twice a day. Start tonight with first dose. Take with food.  If symptoms not improve in the next  week follow up with your PCP.   If develop worsening of pain, shortness of breath , difficulty breathing, dizziness, feeling like about to pass out or otherwise worsening please go to the Er.     ED Prescriptions    Medication Sig Dispense Auth. Provider   naproxen (NAPROSYN) 500 MG tablet Take 1 tablet (500 mg total) by mouth 2 (two) times daily. 30 tablet Georgetta Haber, NP     Controlled Substance Prescriptions [place] Controlled Substance Registry consulted? Not Applicable   Georgetta Haber, NP 07/02/18 1024

## 2018-07-03 ENCOUNTER — Other Ambulatory Visit (HOSPITAL_COMMUNITY)
Admission: RE | Admit: 2018-07-03 | Discharge: 2018-07-03 | Disposition: A | Discharge status: Home / Self Care | Payer: 59 | Source: Ambulatory Visit | Attending: Family Medicine | Admitting: Family Medicine

## 2018-07-03 ENCOUNTER — Ambulatory Visit (INDEPENDENT_AMBULATORY_CARE_PROVIDER_SITE_OTHER): Payer: 59 | Admitting: Family Medicine

## 2018-07-03 ENCOUNTER — Encounter: Payer: Self-pay | Admitting: Family Medicine

## 2018-07-03 VITALS — BP 118/76 | HR 80 | Resp 16 | Ht 64.0 in | Wt 210.6 lb

## 2018-07-03 DIAGNOSIS — R102 Pelvic and perineal pain: Secondary | ICD-10-CM | POA: Diagnosis not present

## 2018-07-03 DIAGNOSIS — N898 Other specified noninflammatory disorders of vagina: Secondary | ICD-10-CM

## 2018-07-03 NOTE — Progress Notes (Signed)
   Subjective:    Patient ID: [name] Sanchez is a 23[name] y.o. female presenting with Vaginal Discharge (light green;ordor x 2 weeks) and Vaginal Pain (during intercourse; burning when inserting tampon x 2weeks)  on 07/03/2018  HPI: Reports painful intercourse, painful tampon insertion. This has been an on-going problem since birth of her child. Has been booked for hyst, but cancelled due to insurance not covering this. She now has her insurance back, notes that she is having pressure and would like to have this done. She is having burning and painful intercourse. Burning in vagina with discharge, odor.  Review of Systems  Constitutional: Negative for chills and fever.  Respiratory: Negative for shortness of breath.   Cardiovascular: Negative for chest pain.  Gastrointestinal: Negative for abdominal pain, nausea and vomiting.  Genitourinary: Negative for dysuria.  Skin: Negative for rash.      Objective:    BP 118/76 (BP Location: Left Arm, Patient Position: Sitting, Cuff Size: Large)   Pulse 80   Resp 16   Ht 5\' 4"  (1.626 m)   Wt 210 lb 9.6 oz (95.5 kg)   LMP 06/16/2018   BMI 36.15 kg/m  Physical Exam  Constitutional: She is oriented to person, place, and time. She appears well-developed and well-nourished. No distress.  HENT:  Head: Normocephalic and atraumatic.  Eyes: No scleral icterus.  Neck: Neck supple.  Cardiovascular: Normal rate.  Pulmonary/Chest: Effort normal.  Abdominal: Soft.  Neurological: She is alert and oriented to person, place, and time.  Skin: Skin is warm and dry.  Psychiatric: She has a normal mood and affect.        Assessment & Plan:   Problem List Items Addressed This Visit      Unprioritized   Pelvic pain    We have previously discussed treatment options. She is adamant she would like to have hysterectomy. Would recommend TVH. SVD x 2. Risks include but are not limited to bleeding, infection, injury to surrounding structures, including bowel,  bladder and ureters, blood clots, and death.  Likelihood of success is high.        Other Visit Diagnoses    Vaginal discharge    -  Primary   Check wet prep and treat appropriately   Relevant Orders   Cervicovaginal ancillary only      Total face-to-face time with patient: 25 minutes. Over 50% of encounter was spent on counseling and coordination of care. Return in about 3 months (around 10/03/2018) for postop check.  Reva Boresanya S Zarek Relph 07/03/2018 3:52 PM

## 2018-07-03 NOTE — Patient Instructions (Signed)
Hysterectomy Information A hysterectomy is a surgery to remove your uterus. After surgery, you will no longer have periods. Also, you will not be able to get pregnant. Reasons for this surgery  You have bleeding that is not normal and keeps coming back.  You have lasting (chronic) lower belly (pelvic) pain.  You have a lasting infection.  The lining of your uterus grows outside your uterus.  The lining of your uterus grows in the muscle of your uterus.  Your uterus falls down into your vagina.  You have a growth in your uterus that causes problems.  You have cells that could turn into cancer (precancerous cells).  You have cancer of the uterus or cervix. Types There are 3 types of hysterectomies. Depending on the type, the surgery will:  Remove the top part of the uterus only.  Remove the uterus and the cervix.  Remove the uterus, cervix, and tissue that holds the uterus in place in the lower belly.  Ways a hysterectomy can be performed There are 5 ways this surgery can be performed.  A cut (incision) is made in the belly (abdomen). The uterus is taken out through the cut.  A cut is made in the vagina. The uterus is taken out through the cut.  Three or four cuts are made in the belly. A surgical device with a camera is put through one of the cuts. The uterus is cut into small pieces. The uterus is taken out through the cuts or the vagina.  Three or four cuts are made in the belly. A surgical device with a camera is put through one of the cuts. The uterus is taken out through the vagina.  Three or four cuts are made in the belly. A surgical device that is controlled by a computer makes a visual image. The device helps the surgeon control the surgical tools. The uterus is cut into small pieces. The pieces are taken out through the cuts or through the vagina.  What can I expect after the surgery?  You will be given pain medicine.  You will need help at home for 3-5 days  after surgery.  You will need to see your doctor in 2-4 weeks after surgery.  You may get hot flashes, have night sweats, and have trouble sleeping.  You may need to have Pap tests in the future if your surgery was related to cancer. Talk to your doctor. It is still good to have regular exams. This information is not intended to replace advice given to you by your health care provider. Make sure you discuss any questions you have with your health care provider. Document Released: 02/13/2012 Document Revised: 04/28/2016 Document Reviewed: 07/29/2013 Elsevier Interactive Patient Education  2018 Elsevier Inc.  

## 2018-07-04 ENCOUNTER — Encounter (HOSPITAL_COMMUNITY): Payer: Self-pay

## 2018-07-04 NOTE — Assessment & Plan Note (Signed)
We have previously discussed treatment options. She is adamant she would like to have hysterectomy. Would recommend TVH. SVD x 2. Risks include but are not limited to bleeding, infection, injury to surrounding structures, including bowel, bladder and ureters, blood clots, and death.  Likelihood of success is high.

## 2018-07-06 LAB — CERVICOVAGINAL ANCILLARY ONLY
Bacterial vaginitis: NEGATIVE
CHLAMYDIA, DNA PROBE: NEGATIVE
Candida vaginitis: NEGATIVE
NEISSERIA GONORRHEA: NEGATIVE
Trichomonas: NEGATIVE

## 2018-07-16 NOTE — Progress Notes (Signed)
Subjective:    Patient ID: [name] Sanchez, female    DOB: [birth year], 22 y.o.   MRN: [id number]  HPI :  [name] Sanchez is here to establish as a new pt.  She is a pleasant 22 year old female. PMH: Migraine with aura- estimates to have 2-3 HAs/week She was followed by [facility], but needs referral to [facility] due to health insurance coverage. She is not currently on any migraine rx Chronic anxiety/depression-currently on fluoxetine 40mg  QD- reports mood is stable.  She is open to CBT referral She will have HYSTERECTOMY VAGINAL WITH SALPINGECTOMY 08/2018 She is married with two sons ages 68,6 She works as Lawyer at [employer] She estimates to drink >60 oz water  She reports eating a diet high in saturated fat/CHO/sugar She denies regular exercise She denies tobacco/ETOH  Patient Care Team    Relationship Specialty Notifications Start End  Curtiss, Orpha Bur D, NP PCP - General Family Medicine  07/17/18   Reva Bores, MD Consulting Physician Obstetrics and Gynecology  07/17/18     Patient Active Problem List   Diagnosis Date Noted  . Pelvic pain 07/31/2017  . Chronic migraine without aura without status migrainosus, not intractable 05/06/2016  . Anxiety and depression 04/04/2012  . Healthcare maintenance 04/01/2012  . Migraine with aura      Past Medical History:  Diagnosis Date  . Anxiety   . Depression   . Endometriosis   . Gestational hypertension 01/2012   also had postpartum preelampsia  . History of stomach ulcers   . Hx of ovarian cyst    x 2-3  . Hx of varicella   . IBS (irritable bowel syndrome)   . Migraine    chronic  . Panic attack   . Plica of knee 08/2012   medial plica left knee     Past Surgical History:  Procedure Laterality Date  . KNEE ARTHROSCOPY  08/17/2012   Procedure: ARTHROSCOPY KNEE;  Surgeon: Harvie Junior, MD;  Location: [facility];  Service: Orthopedics;  Laterality: Left;  left knee scope with tight lateral  retinacular release and plica removal  . TYMPANOSTOMY TUBE PLACEMENT       Family History  Problem Relation Age of Onset  . Hypertension Mother   . Heart disease Mother        MI  . Anesthesia problems Mother        post-op N/V  . Depression Mother   . Anxiety disorder Mother   . Obesity Mother   . Congestive Heart Failure Mother   . Asthma Sister   . Bipolar disorder Sister   . Asthma Brother   . Diabetes Maternal Aunt   . Heart disease Father   . Bipolar disorder Father   . Hyperlipidemia Father   . Uterine cancer Maternal Grandmother   . Pancreatic cancer Maternal Grandmother   . Lung cancer Maternal Grandfather      Social History   Substance and Sexual Activity  Drug Use No     Social History   Substance and Sexual Activity  Alcohol Use No  . Alcohol/week: 0.0 standard drinks   Comment: occassionally     Social History   Tobacco Use  Smoking Status Never Smoker  Smokeless Tobacco Never Used     Outpatient Encounter Medications as of 07/17/2018  Medication Sig  . acetaminophen (TYLENOL 8 HOUR) 650 MG CR tablet Take 1 tablet (650 mg total) by mouth every 8 (eight)  hours as needed.  . ALPRAZolam (XANAX) 0.5 MG tablet Take 1 tablet (0.5 mg total) by mouth daily as needed for anxiety.  Marland Kitchen. azithromycin (ZITHROMAX) 250 MG tablet See admin instructions.  Marland Kitchen. FLUoxetine (PROZAC) 40 MG capsule Take 1 capsule (40 mg total) by mouth daily.  Marland Kitchen. ibuprofen (ADVIL,MOTRIN) 600 MG tablet Take 1 tablet (600 mg total) by mouth every 6 (six) hours as needed.  . ondansetron (ZOFRAN) 4 MG tablet Take 4 mg by mouth every 8 (eight) hours as needed.  Marland Kitchen. PROAIR HFA 108 (90 Base) MCG/ACT inhaler INHALE 1 TO 2 PUFFS BY MOUTH EVERY 4 TO 6 HOURS AS NEEDED  . promethazine (PHENERGAN) 25 MG tablet Take 1 tablet by mouth every 4 (four) hours as needed.  . [DISCONTINUED] naproxen (NAPROSYN) 500 MG tablet Take 1 tablet (500 mg total) by mouth 2 (two) times daily.   No  facility-administered encounter medications on file as of 07/17/2018.     Allergies: Cefuroxime axetil and Penicillins  Body mass index is 35.79 kg/m.  Blood pressure 120/79, pulse 79, height 5\' 4"  (1.626 m), weight 208 lb 8 oz (94.6 kg), last menstrual period 07/15/2018, SpO2 99 %.  Review of Systems  Constitutional: Positive for fatigue. Negative for activity change, appetite change, chills, diaphoresis, fever and unexpected weight change.  Eyes: Negative for visual disturbance.  Respiratory: Negative for cough, chest tightness, shortness of breath, wheezing and stridor.   Cardiovascular: Negative for chest pain, palpitations and leg swelling.  Gastrointestinal: Negative for abdominal distention, abdominal pain, blood in stool, constipation, diarrhea, nausea and vomiting.  Genitourinary: Negative for difficulty urinating and flank pain.  Musculoskeletal: Negative for arthralgias, back pain, gait problem, joint swelling, myalgias, neck pain and neck stiffness.  Skin: Negative for color change, pallor, rash and wound.  Neurological: Positive for headaches. Negative for dizziness.  Hematological: Does not bruise/bleed easily.  Psychiatric/Behavioral: Positive for dysphoric mood. Negative for behavioral problems, confusion, decreased concentration, hallucinations, self-injury, sleep disturbance and suicidal ideas. The patient is nervous/anxious. The patient is not hyperactive.        Objective:   Physical Exam  Constitutional: She is oriented to person, place, and time. She appears well-developed and well-nourished. No distress.  HENT:  Head: Normocephalic and atraumatic.  Right Ear: External ear normal.  Left Ear: External ear normal.  Nose: Nose normal.  Mouth/Throat: Oropharynx is clear and moist.  Eyes: Pupils are equal, round, and reactive to light. Conjunctivae and EOM are normal.  Cardiovascular: Normal rate, regular rhythm, normal heart sounds and intact distal pulses.  No  murmur heard. Pulmonary/Chest: Effort normal and breath sounds normal. No stridor. No respiratory distress. She has no wheezes. She has no rales. She exhibits no tenderness.  Neurological: She is alert and oriented to person, place, and time.  Skin: Skin is warm and dry. Capillary refill takes less than 2 seconds. No rash noted. She is not diaphoretic. No erythema. No pallor.  Psychiatric: She has a normal mood and affect. Her behavior is normal. Judgment and thought content normal.      Assessment & Plan:   1. Migraine with aura and without status migrainosus, not intractable   2. Healthcare maintenance   3. Depression with anxiety   4. Anxiety and depression     Migraine with aura Referral to Neurology placed Not currently on triptans  Anxiety and depression Mood stable  Currently on fluoxetine 40mg  QD Referral to mental health  Healthcare maintenance Continue all medications as directed. Increase water intake, strive  for at least 75 ounces/day.   Follow Mediterranean diet. Increase regular exercise.  Recommend at least 30 minutes daily, 5 days per week of walking, jogging, biking, swimming, YouTube/Pinterest workout videos. Referral to Neurology placed. Recommend complete physical with fasting labs in 6 weeks.    FOLLOW-UP:  Return in about 2 months (around 09/16/2018) for CPE, Fasting Labs.

## 2018-07-17 ENCOUNTER — Encounter: Payer: Self-pay | Admitting: Adult Health

## 2018-07-17 ENCOUNTER — Ambulatory Visit (INDEPENDENT_AMBULATORY_CARE_PROVIDER_SITE_OTHER): Payer: 59 | Admitting: Adult Health

## 2018-07-17 VITALS — BP 120/79 | HR 79 | Ht 64.0 in | Wt 208.5 lb

## 2018-07-17 DIAGNOSIS — F418 Other specified anxiety disorders: Secondary | ICD-10-CM

## 2018-07-17 DIAGNOSIS — G43109 Migraine with aura, not intractable, without status migrainosus: Secondary | ICD-10-CM | POA: Diagnosis not present

## 2018-07-17 DIAGNOSIS — Z0000 Encounter for general adult medical examination without abnormal findings: Secondary | ICD-10-CM

## 2018-07-17 DIAGNOSIS — F419 Anxiety disorder, unspecified: Secondary | ICD-10-CM | POA: Diagnosis not present

## 2018-07-17 DIAGNOSIS — F329 Major depressive disorder, single episode, unspecified: Secondary | ICD-10-CM

## 2018-07-17 NOTE — Assessment & Plan Note (Signed)
Continue all medications as directed. Increase water intake, strive for at least 75 ounces/day.   Follow Mediterranean diet. Increase regular exercise.  Recommend at least 30 minutes daily, 5 days per week of walking, jogging, biking, swimming, YouTube/Pinterest workout videos. Referral to Neurology placed. Recommend complete physical with fasting labs in 6 weeks.

## 2018-07-17 NOTE — Assessment & Plan Note (Signed)
Referral to Neurology placed Not currently on triptans

## 2018-07-17 NOTE — Assessment & Plan Note (Signed)
Mood stable  Currently on fluoxetine 40mg  QD Referral to mental health

## 2018-07-17 NOTE — Patient Instructions (Addendum)
Mediterranean Diet A Mediterranean diet refers to food and lifestyle choices that are based on the traditions of countries located on the [place]. This way of eating has been shown to help prevent certain conditions and improve outcomes for people who have chronic diseases, like kidney disease and heart disease. What are tips for following this plan? Lifestyle  Cook and eat meals together with your family, when possible.  Drink enough fluid to keep your urine clear or pale yellow.  Be physically active every day. This includes: ? Aerobic exercise like running or swimming. ? Leisure activities like gardening, walking, or housework.  Get 7-8 hours of sleep each night.  If recommended by your health care provider, drink red wine in moderation. This means 1 glass a day for nonpregnant women and 2 glasses a day for men. A glass of wine equals 5 oz (150 mL). Reading food labels  Check the serving size of packaged foods. For foods such as rice and pasta, the serving size refers to the amount of cooked product, not dry.  Check the total fat in packaged foods. Avoid foods that have saturated fat or trans fats.  Check the ingredients list for added sugars, such as corn syrup. Shopping  At the grocery store, buy most of your food from the areas near the walls of the store. This includes: ? Fresh fruits and vegetables (produce). ? Grains, beans, nuts, and seeds. Some of these may be available in unpackaged forms or large amounts (in bulk). ? Fresh seafood. ? Poultry and eggs. ? Low-fat dairy products.  Buy whole ingredients instead of prepackaged foods.  Buy fresh fruits and vegetables in-season from local farmers markets.  Buy frozen fruits and vegetables in resealable bags.  If you do not have access to quality fresh seafood, buy precooked frozen shrimp or canned fish, such as tuna, salmon, or sardines.  Buy small amounts of raw or cooked vegetables, salads, or olives from the  deli or salad bar at your store.  Stock your pantry so you always have certain foods on hand, such as olive oil, canned tuna, canned tomatoes, rice, pasta, and beans. Cooking  Cook foods with extra-virgin olive oil instead of using butter or other vegetable oils.  Have meat as a side dish, and have vegetables or grains as your main dish. This means having meat in small portions or adding small amounts of meat to foods like pasta or stew.  Use beans or vegetables instead of meat in common dishes like chili or lasagna.  Experiment with different cooking methods. Try roasting or broiling vegetables instead of steaming or sauteing them.  Add frozen vegetables to soups, stews, pasta, or rice.  Add nuts or seeds for added healthy fat at each meal. You can add these to yogurt, salads, or vegetable dishes.  Marinate fish or vegetables using olive oil, lemon juice, garlic, and fresh herbs. Meal planning  Plan to eat 1 vegetarian meal one day each week. Try to work up to 2 vegetarian meals, if possible.  Eat seafood 2 or more times a week.  Have healthy snacks readily available, such as: ? Vegetable sticks with hummus. ? Greek yogurt. ? Fruit and nut trail mix.  Eat balanced meals throughout the week. This includes: ? Fruit: 2-3 servings a day ? Vegetables: 4-5 servings a day ? Low-fat dairy: 2 servings a day ? Fish, poultry, or lean meat: 1 serving a day ? Beans and legumes: 2 or more servings a week ? Nuts   and seeds: 1-2 servings a day ? Whole grains: 6-8 servings a day ? Extra-virgin olive oil: 3-4 servings a day  Limit red meat and sweets to only a few servings a month What are my food choices?  Mediterranean diet ? Recommended ? Grains: Whole-grain pasta. Brown rice. Bulgar wheat. Polenta. Couscous. Whole-wheat bread. [place]. ? Vegetables: Artichokes. Beets. Broccoli. Cabbage. Carrots. Eggplant. Green beans. Chard. Kale. Spinach. Onions. Leeks. Peas. Squash.  Tomatoes. Peppers. Radishes. ? Fruits: Apples. Apricots. Avocado. Berries. Bananas. Cherries. Dates. Figs. Grapes. Lemons. Melon. Oranges. Peaches. Plums. Pomegranate. ? Meats and other protein foods: Beans. Almonds. Sunflower seeds. Pine nuts. Peanuts. [place]. Salmon. Scallops. Shrimp. [place]. Tilapia. Clams. Oysters. Eggs. ? Dairy: Low-fat milk. Cheese. Greek yogurt. ? Beverages: Water. Red wine. Herbal tea. ? Fats and oils: Extra virgin olive oil. Avocado oil. Grape seed oil. ? Sweets and desserts: [place] yogurt with honey. Baked apples. Poached pears. Trail mix. ? Seasoning and other foods: Basil. Cilantro. Coriander. Cumin. Mint. Parsley. Sage. Rosemary. Tarragon. Garlic. Oregano. Thyme. Pepper. Balsalmic vinegar. Tahini. Hummus. Tomato sauce. Olives. Mushrooms. ? Limit these ? Grains: Prepackaged pasta or rice dishes. Prepackaged cereal with added sugar. ? Vegetables: Deep fried potatoes (french fries). ? Fruits: Fruit canned in syrup. ? Meats and other protein foods: Beef. Pork. Lamb. Poultry with skin. Hot dogs. [place] Salines. ? Dairy: Ice cream. Sour cream. Whole milk. ? Beverages: Juice. Sugar-sweetened soft drinks. Beer. Liquor and spirits. ? Fats and oils: Butter. Canola oil. Vegetable oil. Beef fat (tallow). Lard. ? Sweets and desserts: Cookies. Cakes. Pies. Candy. ? Seasoning and other foods: Mayonnaise. Premade sauces and marinades. ? The items listed may not be a complete list. Talk with your dietitian about what dietary choices are right for you. Summary  The Mediterranean diet includes both food and lifestyle choices.  Eat a variety of fresh fruits and vegetables, beans, nuts, seeds, and whole grains.  Limit the amount of red meat and sweets that you eat.  Talk with your health care provider about whether it is safe for you to drink red wine in moderation. This means 1 glass a day for nonpregnant women and 2 glasses a day for men. A glass of wine equals 5 oz (150 mL). This information  is not intended to replace advice given to you by your health care provider. Make sure you discuss any questions you have with your health care provider. Document Released: 07/14/2016 Document Revised: 08/16/2016 Document Reviewed: 07/14/2016 Elsevier Interactive Patient Education  2018 [employer].   Migraine Headache A migraine headache is an intense, throbbing pain on one side or both sides of the head. Migraines may also cause other symptoms, such as nausea, vomiting, and sensitivity to light and noise. What are the causes? Doing or taking certain things may also trigger migraines, such as:  Alcohol.  Smoking.  Medicines, such as: ? Medicine used to treat chest pain (nitroglycerine). ? Birth control pills. ? Estrogen pills. ? Certain blood pressure medicines.  Aged cheeses, chocolate, or caffeine.  Foods or drinks that contain nitrates, glutamate, aspartame, or tyramine.  Physical activity.  Other things that may trigger a migraine include:  Menstruation.  Pregnancy.  Hunger.  Stress, lack of sleep, too much sleep, or fatigue.  Weather changes.  What increases the risk? The following factors may make you more likely to experience migraine headaches:  Age. Risk increases with age.  Family history of migraine headaches.  Being Caucasian.  Depression and anxiety.  Obesity.  Being a woman.  Having a hole in the heart (patent foramen ovale) or other heart problems.  What are the signs or symptoms? The main symptom of this condition is pulsating or throbbing pain. Pain may:  Happen in any area of the head, such as on one side or both sides.  Interfere with daily activities.  Get worse with physical activity.  Get worse with exposure to bright lights or loud noises.  Other symptoms may include:  Nausea.  Vomiting.  Dizziness.  General sensitivity to bright lights, loud noises, or smells.  Before you get a migraine, you may get warning signs  that a migraine is developing (aura). An aura may include:  Seeing flashing lights or having blind spots.  Seeing bright spots, halos, or zigzag lines.  Having tunnel vision or blurred vision.  Having numbness or a tingling feeling.  Having trouble talking.  Having muscle weakness.  How is this diagnosed? A migraine headache can be diagnosed based on:  Your symptoms.  A physical exam.  Tests, such as CT scan or MRI of the head. These imaging tests can help rule out other causes of headaches.  Taking fluid from the spine (lumbar puncture) and analyzing it (cerebrospinal fluid analysis, or CSF analysis).  How is this treated? A migraine headache is usually treated with medicines that:  Relieve pain.  Relieve nausea.  Prevent migraines from coming back.  Treatment may also include:  Acupuncture.  Lifestyle changes like avoiding foods that trigger migraines.  Follow these instructions at home: Medicines  Take over-the-counter and prescription medicines only as told by your health care provider.  Do not drive or use heavy machinery while taking prescription pain medicine.  To prevent or treat constipation while you are taking prescription pain medicine, your health care provider may recommend that you: ? Drink enough fluid to keep your urine clear or pale yellow. ? Take over-the-counter or prescription medicines. ? Eat foods that are high in fiber, such as fresh fruits and vegetables, whole grains, and beans. ? Limit foods that are high in fat and processed sugars, such as fried and sweet foods. Lifestyle  Avoid alcohol use.  Do not use any products that contain nicotine or tobacco, such as cigarettes and e-cigarettes. If you need help quitting, ask your health care provider.  Get at least 8 hours of sleep every night.  Limit your stress. General instructions   Keep a journal to find out what may trigger your migraine headaches. For example, write  down: ? What you eat and drink. ? How much sleep you get. ? Any change to your diet or medicines.  If you have a migraine: ? Avoid things that make your symptoms worse, such as bright lights. ? It may help to lie down in a dark, quiet room. ? Do not drive or use heavy machinery. ? Ask your health care provider what activities are safe for you while you are experiencing symptoms.  Keep all follow-up visits as told by your health care provider. This is important. Contact a health care provider if:  You develop symptoms that are different or more severe than your usual migraine symptoms. Get help right away if:  Your migraine becomes severe.  You have a fever.  You have a stiff neck.  You have vision loss.  Your muscles feel weak or like you cannot control them.  You start to lose your balance often.  You develop trouble walking.  You faint. This information is not intended to replace advice given  to you by your health care provider. Make sure you discuss any questions you have with your health care provider. Document Released: 11/21/2005 Document Revised: 06/10/2016 Document Reviewed: 05/09/2016 Elsevier Interactive Patient Education  2017 [employer]  Continue all medications as directed. Increase water intake, strive for at least 75 ounces/day.   Follow Mediterranean diet. Increase regular exercise.  Recommend at least 30 minutes daily, 5 days per week of walking, jogging, biking, swimming, YouTube/Pinterest workout videos. Referral to Neurology placed. Recommend complete physical with fasting labs in 6 weeks. Good luck with your hysterectomy. WELCOME TO THE PRACTICE!

## 2018-07-23 NOTE — Patient Instructions (Addendum)
Your procedure is scheduled on: Tuesday August 14, 2018 at 1:00 pm  Enter through the Main Entrance of [facility] at: 11:30 am  Pick up the phone at the desk and dial [phone].  Call this number if you have problems the morning of surgery: [phone].  Remember: Do NOT eat food: after Midnight on Monday September 9  Do NOT drink clear liquids (including water) after: 7:00 day of surgery  Take these medicines the morning of surgery with a SIP OF WATER: prozac and xanax if needed.  Bring Proair Inhaler with you on day of surgery.  STOP ALL VITAMINS, SUPPLEMENTS, HERBAL MEDICATIONS, IBUPROFEN/NSAIDS NOW  DO NOT SMOKE DAY OF SURGERY  BRUSH YOUR TEETH DAY OF SURGERY  Do NOT wear jewelry (body piercing), metal hair clips/bobby pins, make-up, or nail polish. Do NOT wear lotions, powders, or perfumes.  You may wear deoderant. Do NOT shave for 48 hours prior to surgery. Do NOT bring valuables to the hospital. Contacts, dentures, or bridgework may not be worn into surgery. Leave suitcase in car.  After surgery it may be brought to your room.  For patients admitted to the hospital, checkout time is 11:00 AM the day of discharge.

## 2018-07-27 NOTE — H&P (Signed)
[name] Sanchez is an 22 y.o. [phone] female.   Chief Complaint: Pelvic pain HPI: notes pain with tampon insertion, intercourse and chronic pelvic pain and lower abdomen since birth of her first child. She has been on oral contraception without relief and course of antibiotics. She desires hysterectomy.  Past Medical History:  Diagnosis Date  . Anxiety   . Depression   . Endometriosis   . Gestational hypertension 01/2012   also had postpartum preelampsia  . History of stomach ulcers   . Hx of ovarian cyst    x 2-3  . Hx of varicella   . IBS (irritable bowel syndrome)   . Migraine    chronic  . Panic attack   . [place] of knee 08/2012   medial plica left knee    Past Surgical History:  Procedure Laterality Date  . KNEE ARTHROSCOPY  08/17/2012   Procedure: ARTHROSCOPY KNEE;  Surgeon: Harvie JuniorJohn L Graves, MD;  Location: [facility];  Service: Orthopedics;  Laterality: Left;  left knee scope with tight lateral retinacular release and plica removal  . TYMPANOSTOMY TUBE PLACEMENT      Family History  Problem Relation Age of Onset  . Hypertension Mother   . Heart disease Mother        MI  . Anesthesia problems Mother        post-op N/V  . Depression Mother   . Anxiety disorder Mother   . Obesity Mother   . Congestive Heart Failure Mother   . Asthma Sister   . Bipolar disorder Sister   . Asthma Brother   . Diabetes Maternal Aunt   . Heart disease Father   . Bipolar disorder Father   . Hyperlipidemia Father   . Uterine cancer Maternal Grandmother   . Pancreatic cancer Maternal Grandmother   . Lung cancer Maternal Grandfather    Social History:  reports that she has never smoked. She has never used smokeless tobacco. She reports that she does not drink alcohol or use drugs.  Allergies:  Allergies  Allergen Reactions  . Cefuroxime Axetil Rash  . Penicillins Rash    No medications prior to admission.    A comprehensive review of systems was negative.  Last  menstrual period 07/15/2018. General appearance: alert and cooperative Head: Normocephalic, without obvious abnormality, atraumatic Neck: supple, symmetrical, trachea midline Lungs: normal effort Heart: regular rate and rhythm Abdomen: soft, non-tender; bowel sounds normal; no masses,  no organomegaly Extremities: extremities normal, atraumatic, no cyanosis or edema Skin: Skin color, texture, turgor normal. No rashes or lesions Neurologic: Grossly normal   Lab Results  Component Value Date   WBC 5.0 05/24/2017   HGB 13.3 05/24/2017   HCT 41.2 05/24/2017   MCV 95 05/24/2017   PLT 215.0 06/22/2015   Lab Results  Component Value Date   PREGTESTUR NEGATIVE 10/28/2013     Assessment/Plan Principal Problem:   Pelvic pain  For TVH with bilateral salpingectomy. ? Adenomyosis, or endometriosis. Diagnosis is in question. After a very lengthy conversation around options, patient elected for hysterectomy. Hysterectomy discussed at length, including alternative treatment with Aygestin or IUD (not a candidate for OC's due to migraine with aura). Also discussed risk that pain may continue after surgery, further w/u may be needed as well as her age at time of hysterectomy and possible desire for more kids in the future. She is quite sure of this decision. Husband with vasectomy already. Risks include but are not limited to bleeding, infection, injury to  surrounding structures, including bowel, bladder and ureters, blood clots, and death.  Likelihood of success is medium.    Reva Bores 07/27/2018, 4:10 PM

## 2018-07-30 ENCOUNTER — Ambulatory Visit (INDEPENDENT_AMBULATORY_CARE_PROVIDER_SITE_OTHER): Payer: 59 | Admitting: Adult Health

## 2018-07-30 ENCOUNTER — Encounter: Payer: Self-pay | Admitting: Adult Health

## 2018-07-30 DIAGNOSIS — G44319 Acute post-traumatic headache, not intractable: Secondary | ICD-10-CM | POA: Insufficient documentation

## 2018-07-30 NOTE — Patient Instructions (Signed)
Concussion, Adult A concussion is a brain injury from a direct hit (blow) to the head or body. This blow causes the brain to shake quickly back and forth inside the skull. This can damage brain cells and cause chemical changes in the brain. A concussion may also be known as a mild traumatic brain injury (TBI). Concussions are usually not life-threatening, but the effects of a concussion can be serious. If you have a concussion, you are more likely to experience concussion-like symptoms after a direct blow to the head in the future. What are the causes? This condition is caused by:  A direct blow to the head, such as from running into another player during a game, being hit in a fight, or hitting your head on a hard surface.  A jolt of the head or neck that causes the brain to move back and forth inside the skull, such as in a car crash.  What are the signs or symptoms? The signs of a concussion can be hard to notice. Early on, they may be missed by you, family members, and health care providers. You may look fine but act or feel differently. Symptoms are usually temporary, but they may last for days, weeks, or even longer. Some symptoms may appear right away but other symptoms may not show up for hours or days. Every head injury is different. Symptoms may include:  Headaches. This can include a feeling of pressure in the head.  Memory problems.  Trouble concentrating, organizing, or making decisions.  Slowness in thinking, acting or reacting, speaking, or reading.  Confusion.  Fatigue.  Changes in eating or sleeping patterns.  Problems with coordination or balance.  Nausea or vomiting.  Numbness or tingling.  Sensitivity to light or noise.  Vision or hearing problems.  Reduced sense of smell.  Irritability or mood changes.  Dizziness.  Lack of motivation.  Seeing or hearing things that other people do not see or hear (hallucinations).  How is this diagnosed? This  condition is diagnosed based on:  Your symptoms.  A description of your injury.  You may also have tests, including:  Imaging tests, such as a CT scan or MRI. These are done to look for signs of brain injury.  Neuropsychological tests. These measure your thinking, understanding, learning, and remembering abilities.  How is this treated? This condition is treated with physical and mental rest and careful observation, usually at home. If the concussion is severe, you may need to stay home from work for a while. You may be referred to a concussion clinic or to other health care providers for management. It is important that you tell your health care provider if:  You are taking any medicines, including prescription medicines, over-the-counter medicines, and natural remedies. Some medicines, such as blood thinners (anticoagulants) and aspirin, may increase the chance of complications, such as bleeding.  You are taking or have taken alcohol or illegal drugs. Alcohol and certain other drugs may slow your recovery and can put you at risk of further injury.  How fast you will recover from a concussion depends on many factors, such as how severe your concussion is, what part of your brain was injured, how old you are, and how healthy you were before the concussion. Recovery can take time. It is important to wait to return to activity until a health care provider says it is safe to do that and your symptoms are completely gone. Follow these instructions at home: Activity  Limit activities that   require a lot of thought or concentration. These may include: ? Doing homework or job-related work. ? Watching TV. ? Working on the computer. ? Playing memory games and puzzles.  Rest. Rest helps the brain to heal. Make sure you: ? Get plenty of sleep at night. Avoid staying up late at night. ? Keep the same bedtime hours on weekends and weekdays. ? Rest during the day. Take naps or rest breaks when you  feel tired.  Having another concussion before the first one has healed can be dangerous. Do not do high-risk activities that could cause a second concussion, such as riding a bicycle or playing sports.  Ask your health care provider when you can return to your normal activities, such as school, work, athletics, driving, riding a bicycle, or using heavy machinery. Your ability to react may be slower after a brain injury. Never do these activities if you are dizzy. Your health care provider will likely give you a plan for gradually returning to activities. General instructions  Take over-the-counter and prescription medicines only as told by your health care provider.  Do not drink alcohol until your health care provider says you can.  If it is harder than usual to remember things, write them down.  If you are easily distracted, try to do one thing at a time. For example, do not try to watch TV while fixing dinner.  Talk with family members or close friends when making important decisions.  Watch your symptoms and tell others to do the same. Complications sometimes occur after a concussion. Older adults with a brain injury may have a higher risk of serious complications, such as a blood clot in the brain.  Tell your teachers, school nurse, school counselor, coach, athletic trainer, or work manager about your injury, symptoms, and restrictions. Tell them about what you can or cannot do. They should watch for: ? Increased problems with attention or concentration. ? Increased difficulty remembering or learning new information. ? Increased time needed to complete tasks or assignments. ? Increased irritability or decreased ability to cope with stress. ? Increased symptoms.  Keep all follow-up visits as told by your health care provider. This is important. How is this prevented? It is very important to avoid another brain injury, especially as you recover. In rare cases, another injury can lead  to permanent brain damage, brain swelling, or death. The risk of this is greatest during the first 7-10 days after a head injury. Avoid injuries by:  Wearing a seat belt when riding in a car.  Wearing a helmet when biking, skiing, skateboarding, skating, or doing similar activities.  Avoiding activities that could lead to a second concussion, such as contact or recreational sports, until your health care provider says it is okay.  Taking safety measures in your home, such as: ? Removing clutter and tripping hazards from floors and stairways. ? Using grab bars in bathrooms and handrails by stairs. ? Placing non-slip mats on floors and in bathtubs. ? Improving lighting in dim areas.  Contact a health care provider if:  Your symptoms get worse.  You have new symptoms.  You continue to have symptoms for more than 2 weeks. Get help right away if:  You have severe or worsening headaches.  You have weakness or numbness in any part of your body.  Your coordination gets worse.  You vomit repeatedly.  You are sleepier.  The pupil of one eye is larger than the other.  You have convulsions or a   seizure.  Your speech is slurred.  Your fatigue, confusion, or irritability gets worse.  You cannot recognize people or places.  You have neck pain.  It is difficult to wake you up.  You have unusual behavior changes./  You lose consciousness./ Summary  A concussion is a brain injury from a direct hit (blow) to the head or body.  A concussion may also be called a mild traumatic brain injury (TBI).  You may have imaging tests and neuropsychological tests to diagnose a concussion.  This condition is treated with physical and mental rest and careful observation.  Ask your health care provider when you can return to your normal activities, such as school, work, athletics, driving, riding a bicycle, or using heavy machinery. Follow safety instructions as told by your health care  provider. This information is not intended to replace advice given to you by your health care provider. Make sure you discuss any questions you have with your health care provider. Document Released: 02/11/2004 Document Revised: 11/01/2016 Document Reviewed: 11/01/2016 Elsevier Interactive Patient Education  2018 [employer]  Follow Concussion care instructions. Stay well hydrated and eat a well balanced diet. Refrain from screens (ie computer, phone) REST, work excuse provided, okay to return 08/06/18 CT ordered. Follow-up in 2 weeks, sooner if needed. FEEL BETTER!

## 2018-07-30 NOTE — Progress Notes (Signed)
Subjective:    Patient ID: [name] Sanchez, female    DOB: March 06, 1996, 22 y.o.   MRN: [id number]  HPI:  [name] Sanchez presents with acute HA r/t to fall on 07/21/18 (two saturdays ago). She was walking up stairs in her garage and turned to speak to her husband. She stumbled and feel, striking her head on cabinets. She denies LOC, however reports sig L occipital pain that immediately developed. She remained on steps after falling for several minutes, then was able to stand. She did not seek immediate medical assistance, this is her first encounter with healthcare for this issue. She reports constant L occipital pain, rated 6/10 and described as "a throbbing/ache" She reports increase in migraine with aura and  persistent nausea w/o vomiting since fall injury. She reports dizziness and "mental fogginess", as well as, sensitivity to computer screens. She denies hx of vertigo She denies previous head injury or hx of concussion  Patient Care Team    Relationship Specialty Notifications Start End  William Hamburger D, NP PCP - General Family Medicine  07/17/18   Reva Bores, MD Consulting Physician Obstetrics and Gynecology  07/17/18     Patient Active Problem List   Diagnosis Date Noted  . Acute post-traumatic headache, not intractable 07/30/2018  . Pelvic pain 07/31/2017  . Chronic migraine without aura without status migrainosus, not intractable 05/06/2016  . Anxiety and depression 04/04/2012  . Healthcare maintenance 04/01/2012  . Migraine with aura      Past Medical History:  Diagnosis Date  . Anxiety   . Depression   . Endometriosis   . Gestational hypertension 01/2012   also had postpartum preelampsia  . History of stomach ulcers   . Hx of ovarian cyst    x 2-3  . Hx of varicella   . IBS (irritable bowel syndrome)   . Migraine    chronic  . Panic attack   . Plica of knee 08/2012   medial plica left knee     Past Surgical History:  Procedure Laterality Date  . KNEE  ARTHROSCOPY  08/17/2012   Procedure: ARTHROSCOPY KNEE;  Surgeon: Harvie Junior, MD;  Location: [facility];  Service: Orthopedics;  Laterality: Left;  left knee scope with tight lateral retinacular release and plica removal  . TYMPANOSTOMY TUBE PLACEMENT       Family History  Problem Relation Age of Onset  . Hypertension Mother   . Heart disease Mother        MI  . Anesthesia problems Mother        post-op N/V  . Depression Mother   . Anxiety disorder Mother   . Obesity Mother   . Congestive Heart Failure Mother   . Asthma Sister   . Bipolar disorder Sister   . Asthma Brother   . Diabetes Maternal Aunt   . Heart disease Father   . Bipolar disorder Father   . Hyperlipidemia Father   . Uterine cancer Maternal Grandmother   . Pancreatic cancer Maternal Grandmother   . Lung cancer Maternal Grandfather      Social History   Substance and Sexual Activity  Drug Use No     Social History   Substance and Sexual Activity  Alcohol Use No  . Alcohol/week: 0.0 standard drinks   Comment: occassionally     Social History   Tobacco Use  Smoking Status Never Smoker  Smokeless Tobacco Never Used     Outpatient Encounter Medications as of  07/30/2018  Medication Sig  . acetaminophen (TYLENOL 8 HOUR) 650 MG CR tablet Take 1 tablet (650 mg total) by mouth every 8 (eight) hours as needed.  . ALPRAZolam (XANAX) 0.5 MG tablet Take 1 tablet (0.5 mg total) by mouth daily as needed for anxiety.  Marland Kitchen. FLUoxetine (PROZAC) 40 MG capsule Take 1 capsule (40 mg total) by mouth daily.  Marland Kitchen. ibuprofen (ADVIL,MOTRIN) 600 MG tablet Take 1 tablet (600 mg total) by mouth every 6 (six) hours as needed.  . ondansetron (ZOFRAN) 4 MG tablet Take 4 mg by mouth every 8 (eight) hours as needed.  Marland Kitchen. PROAIR HFA 108 (90 Base) MCG/ACT inhaler INHALE 1 TO 2 PUFFS BY MOUTH EVERY 4 TO 6 HOURS AS NEEDED  . [DISCONTINUED] azithromycin (ZITHROMAX) 250 MG tablet See admin instructions.  . [DISCONTINUED]  promethazine (PHENERGAN) 25 MG tablet Take 1 tablet by mouth every 4 (four) hours as needed.   No facility-administered encounter medications on file as of 07/30/2018.     Allergies: Cefuroxime axetil and Penicillins  Body mass index is 36.18 kg/m.  Blood pressure 114/78, pulse 90, height 5\' 4"  (1.626 m), weight 210 lb 12.8 oz (95.6 kg), last menstrual period 07/15/2018, SpO2 100 %. Review of Systems  Constitutional: Positive for activity change, appetite change and fatigue. Negative for chills, diaphoresis, fever and unexpected weight change.  HENT: Negative for congestion and postnasal drip.   Eyes: Negative for visual disturbance.  Respiratory: Negative for cough, chest tightness, shortness of breath, wheezing and stridor.   Cardiovascular: Negative for chest pain and palpitations.  Gastrointestinal: Positive for nausea. Negative for abdominal distention, abdominal pain, blood in stool, constipation, diarrhea and vomiting.  Endocrine: Negative for cold intolerance, heat intolerance, polydipsia, polyphagia and polyuria.  Genitourinary: Negative for difficulty urinating and frequency.  Musculoskeletal: Positive for myalgias, neck pain and neck stiffness. Negative for gait problem.  Skin: Negative for color change, pallor, rash and wound.  Neurological: Positive for dizziness and headaches. Negative for tremors, seizures, syncope, facial asymmetry, speech difficulty, weakness, light-headedness and numbness.  Hematological: Does not bruise/bleed easily.  Psychiatric/Behavioral: Positive for confusion, decreased concentration and sleep disturbance. Negative for agitation, behavioral problems, dysphoric mood, hallucinations, self-injury and suicidal ideas. The patient is not nervous/anxious and is not hyperactive.        Objective:   Physical Exam  Constitutional: She is oriented to person, place, and time. She appears well-developed and well-nourished. No distress.  HENT:  Head:  Normocephalic and atraumatic. Not macrocephalic and not microcephalic. Head is without raccoon's eyes, without Battle's sign, without abrasion, without contusion, without right periorbital erythema and without left periorbital erythema.    Right Ear: External ear normal. Tympanic membrane is not erythematous and not bulging. No decreased hearing is noted.  Left Ear: External ear normal. Tympanic membrane is not erythematous and not bulging. No decreased hearing is noted.  Nose: Nose normal. No mucosal edema or rhinorrhea. Right sinus exhibits no maxillary sinus tenderness and no frontal sinus tenderness. Left sinus exhibits no maxillary sinus tenderness and no frontal sinus tenderness.  Mouth/Throat: Oropharynx is clear and moist.  Tender to touch over L occipital region No contusion/hematoma noted No laceration or open tissue noted  Eyes: Pupils are equal, round, and reactive to light. Conjunctivae, EOM and lids are normal. Lids are everted and swept, no foreign bodies found. Right conjunctiva is not injected. Right conjunctiva has no hemorrhage. Left conjunctiva is not injected. Left conjunctiva has no hemorrhage.  Neck: Normal range of motion. Neck supple.  Cardiovascular: Normal rate, regular rhythm, normal heart sounds and intact distal pulses.  No murmur heard. Pulmonary/Chest: Effort normal and breath sounds normal. No stridor. No respiratory distress. She has no wheezes. She has no rales. She exhibits no tenderness.  Musculoskeletal: Normal range of motion.  Lymphadenopathy:    She has no cervical adenopathy.  Neurological: She is alert and oriented to person, place, and time. She displays normal reflexes. No cranial nerve deficit or sensory deficit. She exhibits normal muscle tone. Coordination normal.  Skin: Skin is warm and dry. Capillary refill takes less than 2 seconds. No rash noted. She is not diaphoretic. No erythema. No pallor.  Psychiatric: She has a normal mood and affect. Her  behavior is normal. Judgment and thought content normal.          Assessment & Plan:   1. Acute post-traumatic headache, not intractable     Acute post-traumatic headache, not intractable Concussion care instructions provided CT WO ordered- we will call when results are available Work excuse provided, okay to return 08/06/18 F/u in 2 weeks  Pt was in the office today for 25+ minutes,I spent > 75% of  Time in face to face counseling of patient's various medical conditions and in coordination of care  FOLLOW-UP:  Return in about 2 weeks (around 08/13/2018).

## 2018-07-30 NOTE — Assessment & Plan Note (Signed)
Concussion care instructions provided CT WO ordered- we will call when results are available Work excuse provided, okay to return 08/06/18 F/u in 2 weeks

## 2018-08-01 ENCOUNTER — Other Ambulatory Visit: Payer: Self-pay

## 2018-08-01 ENCOUNTER — Encounter (HOSPITAL_COMMUNITY)
Admission: RE | Admit: 2018-08-01 | Discharge: 2018-08-01 | Disposition: A | Discharge status: Home / Self Care | Payer: 59 | Source: Ambulatory Visit | Attending: Family Medicine | Admitting: Family Medicine

## 2018-08-01 ENCOUNTER — Encounter (HOSPITAL_COMMUNITY): Payer: Self-pay

## 2018-08-01 DIAGNOSIS — Z01812 Encounter for preprocedural laboratory examination: Secondary | ICD-10-CM | POA: Insufficient documentation

## 2018-08-01 HISTORY — DX: Adverse effect of unspecified anesthetic, initial encounter: T41.45XA

## 2018-08-01 HISTORY — DX: Family history of other specified conditions: Z84.89

## 2018-08-01 HISTORY — DX: Mononeuropathy, unspecified: G58.9

## 2018-08-01 HISTORY — DX: Other complications of anesthesia, initial encounter: T88.59XA

## 2018-08-01 LAB — CBC
HEMATOCRIT: 37.4 % (ref 36.0–46.0)
Hemoglobin: 12.5 g/dL (ref 12.0–15.0)
MCH: 30.9 pg (ref 26.0–34.0)
MCHC: 33.4 g/dL (ref 30.0–36.0)
MCV: 92.6 fL (ref 78.0–100.0)
Platelets: 245 10*3/uL (ref 150–400)
RBC: 4.04 MIL/uL (ref 3.87–5.11)
RDW: 12.5 % (ref 11.5–15.5)
WBC: 7.3 10*3/uL (ref 4.0–10.5)

## 2018-08-01 LAB — TYPE AND SCREEN
ABO/RH(D): O POS
Antibody Screen: NEGATIVE

## 2018-08-02 ENCOUNTER — Ambulatory Visit (HOSPITAL_COMMUNITY): Admission: RE | Admit: 2018-08-02 | Payer: 59 | Source: Ambulatory Visit

## 2018-08-03 ENCOUNTER — Emergency Department (HOSPITAL_COMMUNITY): Payer: 59

## 2018-08-03 ENCOUNTER — Other Ambulatory Visit: Payer: Self-pay

## 2018-08-03 ENCOUNTER — Emergency Department (HOSPITAL_COMMUNITY)
Admission: EM | Admit: 2018-08-03 | Discharge: 2018-08-03 | Disposition: A | Discharge status: Home / Self Care | Payer: 59 | Attending: Emergency Medicine | Admitting: Emergency Medicine

## 2018-08-03 DIAGNOSIS — Y929 Unspecified place or not applicable: Secondary | ICD-10-CM | POA: Insufficient documentation

## 2018-08-03 DIAGNOSIS — S0990XA Unspecified injury of head, initial encounter: Secondary | ICD-10-CM

## 2018-08-03 DIAGNOSIS — Y9301 Activity, walking, marching and hiking: Secondary | ICD-10-CM | POA: Diagnosis not present

## 2018-08-03 DIAGNOSIS — R519 Headache, unspecified: Secondary | ICD-10-CM

## 2018-08-03 DIAGNOSIS — Z79899 Other long term (current) drug therapy: Secondary | ICD-10-CM | POA: Diagnosis not present

## 2018-08-03 DIAGNOSIS — Y999 Unspecified external cause status: Secondary | ICD-10-CM | POA: Diagnosis not present

## 2018-08-03 DIAGNOSIS — R51 Headache: Secondary | ICD-10-CM | POA: Diagnosis not present

## 2018-08-03 DIAGNOSIS — S0090XA Unspecified superficial injury of unspecified part of head, initial encounter: Secondary | ICD-10-CM | POA: Diagnosis present

## 2018-08-03 DIAGNOSIS — W108XXA Fall (on) (from) other stairs and steps, initial encounter: Secondary | ICD-10-CM | POA: Diagnosis not present

## 2018-08-03 MED ORDER — SODIUM CHLORIDE 0.9 % IV BOLUS
500.0000 mL | Freq: Once | INTRAVENOUS | Status: AC
  Administered 2018-08-03: 500 mL via INTRAVENOUS

## 2018-08-03 MED ORDER — KETOROLAC TROMETHAMINE 30 MG/ML IJ SOLN
30.0000 mg | Freq: Once | INTRAMUSCULAR | Status: AC
  Administered 2018-08-03: 30 mg via INTRAVENOUS
  Filled 2018-08-03: qty 1

## 2018-08-03 MED ORDER — DIPHENHYDRAMINE HCL 50 MG/ML IJ SOLN
12.5000 mg | Freq: Once | INTRAMUSCULAR | Status: AC
  Administered 2018-08-03: 12.5 mg via INTRAVENOUS
  Filled 2018-08-03: qty 1

## 2018-08-03 MED ORDER — METOCLOPRAMIDE HCL 5 MG/ML IJ SOLN
10.0000 mg | Freq: Once | INTRAMUSCULAR | Status: AC
  Administered 2018-08-03: 10 mg via INTRAVENOUS
  Filled 2018-08-03: qty 2

## 2018-08-03 NOTE — ED Provider Notes (Signed)
[facility] EMERGENCY DEPARTMENT Provider Note   CSN: [id number] Arrival date & time: 08/03/18  0057     History   Chief Complaint Chief Complaint  Patient presents with  . Migraine    HPI [name] Sanchez is a 22 y.o. female with a hx of anxiety, depression, migraine headache presents to the Emergency Department complaining of gradual, persistent, progressively worsening left sided headache onset approx 3 hours PTA. Pt reports this is the same as previous migraine headaches. She reports associated N/V, photophobia, phonophobia.  Pt reports she was previously followed by [place] neurologist in [place] but is [facility].  She reports a scheduled appointment on Oct 9th.  She is not currently on any preventative or abortive therapies.  Pt reports she has been going to  Urgent care for IM Toradol and phenergan when her headaches get bad.  No treatments PTA tonight.    Pt reports she fell own a set of stairs approx 1 week ago and hit her head.  She denies LOC at that time.  She denies numbness, tingling, weakness, back pain, neck pain, loss of bowel or bladder control.  She reports intermittent blurred vision and an increase in her migraines since the fall.  Pt reports she was evaluated by her PCP several days ago who wrote the pt out of work and ordered at CT.  Pt reports she did not go get the CT and went to work anyway.     The history is provided by the patient and medical records. No language interpreter was used.    Past Medical History:  Diagnosis Date  . Anxiety   . Complication of anesthesia    wakes up crying  . Depression   . Endometriosis   . Family history of adverse reaction to anesthesia    hard to wake up, low heart rate  . Gestational hypertension 01/2012   also had postpartum preelampsia  . History of stomach ulcers   . Hx of ovarian cyst    x 2-3  . Hx of varicella   . IBS (irritable bowel syndrome)   . Migraine    chronic  .  Panic attack   . Pinched nerve    left arm  . Plica of knee 08/2012   medial plica left knee    Patient Active Problem List   Diagnosis Date Noted  . Acute post-traumatic headache, not intractable 07/30/2018  . Pelvic pain 07/31/2017  . Chronic migraine without aura without status migrainosus, not intractable 05/06/2016  . Anxiety and depression 04/04/2012  . Healthcare maintenance 04/01/2012  . Migraine with aura     Past Surgical History:  Procedure Laterality Date  . KNEE ARTHROSCOPY  08/17/2012   Procedure: ARTHROSCOPY KNEE;  Surgeon: Harvie JuniorJohn L Graves, MD;  Location: [facility];  Service: Orthopedics;  Laterality: Left;  left knee scope with tight lateral retinacular release and plica removal  . TYMPANOSTOMY TUBE PLACEMENT       OB History    Gravida  3   Para  2   Term  2   Preterm      AB  1   Living  2     SAB      TAB  1   Ectopic      Multiple      Live Births  2            Home Medications    Prior to Admission medications  Medication Sig Start Date End Date Taking? Authorizing Provider  acetaminophen (TYLENOL 8 HOUR) 650 MG CR tablet Take 1 tablet (650 mg total) by mouth every 8 (eight) hours as needed. Patient taking differently: Take 650 mg by mouth every 8 (eight) hours as needed for pain.  04/17/18  Yes Derwood Kaplan, MD  ALPRAZolam Prudy Feeler) 0.5 MG tablet Take 1 tablet (0.5 mg total) by mouth daily as needed for anxiety. 02/21/18  Yes Lorre Munroe, NP  FLUoxetine (PROZAC) 40 MG capsule Take 1 capsule (40 mg total) by mouth daily. 02/21/18  Yes Baity, Salvadore Oxford, NP  ibuprofen (ADVIL,MOTRIN) 600 MG tablet Take 1 tablet (600 mg total) by mouth every 6 (six) hours as needed. Patient taking differently: Take 600 mg by mouth every 6 (six) hours as needed for moderate pain.  04/17/18  Yes Derwood Kaplan, MD  ondansetron (ZOFRAN) 4 MG tablet Take 4 mg by mouth every 8 (eight) hours as needed for nausea or vomiting.  07/09/18  Yes  [provider]  PROAIR HFA 108 (90 Base) MCG/ACT inhaler Inhale 1-2 puffs into the lungs every 4 (four) hours as needed for wheezing or shortness of breath.  07/15/18  Yes [provider]  promethazine (PHENERGAN) 25 MG tablet Take 25 mg by mouth every 6 (six) hours as needed for nausea or vomiting.   Yes [provider]    Family History Family History  Problem Relation Age of Onset  . Hypertension Mother   . Heart disease Mother        MI  . Anesthesia problems Mother        post-op N/V  . Depression Mother   . Anxiety disorder Mother   . Obesity Mother   . Congestive Heart Failure Mother   . Asthma Sister   . Bipolar disorder Sister   . Asthma Brother   . Diabetes Maternal Aunt   . Heart disease Father   . Bipolar disorder Father   . Hyperlipidemia Father   . Uterine cancer Maternal Grandmother   . Pancreatic cancer Maternal Grandmother   . Lung cancer Maternal Grandfather     Social History Social History   Tobacco Use  . Smoking status: Never Smoker  . Smokeless tobacco: Never Used  Substance Use Topics  . Alcohol use: No    Alcohol/week: 0.0 standard drinks    Comment: occassionally  . Drug use: No     Allergies   Percocet [oxycodone-acetaminophen]; Cefuroxime axetil; and Penicillins   Review of Systems Review of Systems  Constitutional: Negative for appetite change, diaphoresis, fatigue, fever and unexpected weight change.  HENT: Negative for mouth sores.   Eyes: Positive for photophobia. Negative for visual disturbance.  Respiratory: Negative for cough, chest tightness, shortness of breath and wheezing.   Cardiovascular: Negative for chest pain.  Gastrointestinal: Positive for nausea and vomiting. Negative for abdominal pain, constipation and diarrhea.  Endocrine: Negative for polydipsia, polyphagia and polyuria.  Genitourinary: Negative for dysuria, frequency, hematuria and urgency.  Musculoskeletal: Negative for back pain  and neck stiffness.  Skin: Negative for rash.  Allergic/Immunologic: Negative for immunocompromised state.  Neurological: Positive for headaches. Negative for syncope and light-headedness.  Hematological: Does not bruise/bleed easily.  Psychiatric/Behavioral: Negative for sleep disturbance. The patient is not nervous/anxious.      Physical Exam Updated Vital Signs BP 117/88 (BP Location: Right Arm)   Pulse 87   Temp 98.6 F (37 C) (Oral)   Resp 18   Ht 5\' 4"  (1.626 m)  Wt 95 kg   LMP 07/15/2018 (Exact Date)   SpO2 100%   BMI 35.95 kg/m   Physical Exam  Constitutional: She is oriented to person, place, and time. She appears well-developed and well-nourished. No distress.  HENT:  Head: Normocephalic and atraumatic.  Mouth/Throat: Oropharynx is clear and moist.  Eyes: Pupils are equal, round, and reactive to light. Conjunctivae and EOM are normal. No scleral icterus.  No horizontal, vertical or rotational nystagmus  Neck: Normal range of motion. Neck supple.  Full active and passive ROM without pain No midline or paraspinal tenderness No nuchal rigidity or meningeal signs  Cardiovascular: Normal rate, regular rhythm and intact distal pulses.  Pulmonary/Chest: Effort normal and breath sounds normal. No respiratory distress. She has no wheezes. She has no rales.  Abdominal: Soft. Bowel sounds are normal. There is no tenderness. There is no rebound and no guarding.  Musculoskeletal: Normal range of motion.  Lymphadenopathy:    She has no cervical adenopathy.  Neurological: She is alert and oriented to person, place, and time. No cranial nerve deficit. She exhibits normal muscle tone. Coordination normal.  Mental Status:  Alert, oriented, thought content appropriate. Speech fluent without evidence of aphasia. Able to follow 2 step commands without difficulty.  Cranial Nerves:  II:  Peripheral visual fields grossly normal, pupils equal, round, reactive to light III,IV, VI:  ptosis not present, extra-ocular motions intact bilaterally  V,VII: smile symmetric, facial light touch sensation equal VIII: hearing grossly normal bilaterally  IX,X: midline uvula rise  XI: bilateral shoulder shrug equal and strong XII: midline tongue extension  Motor:  5/5 in upper and lower extremities bilaterally including strong and equal grip strength and dorsiflexion/plantar flexion Sensory: Pinprick and light touch normal in all extremities.  Cerebellar: normal finger-to-nose with bilateral upper extremities Gait: normal gait and balance CV: distal pulses palpable throughout   Skin: Skin is warm and dry. No rash noted. She is not diaphoretic.  Psychiatric: She has a normal mood and affect. Her behavior is normal. Judgment and thought content normal.  Nursing note and vitals reviewed.    ED Treatments / Results   Radiology Ct Head Wo Contrast  Result Date: 08/03/2018 CLINICAL DATA:  22 year old female with migraine headache.  Trauma. EXAM: CT HEAD WITHOUT CONTRAST TECHNIQUE: Contiguous axial images were obtained from the base of the skull through the vertex without intravenous contrast. COMPARISON:  Brain MRI dated 05/12/2017 FINDINGS: Evaluation of this exam is limited due to motion artifact. Brain: The ventricles and sulci appropriate size for patient's age. The gray-white matter discrimination is preserved. There is no acute intracranial hemorrhage. The lower portion of the brain is not evaluated and not well visualized due to severe motion artifact. No mass effect or midline shift. Vascular: No hyperdense vessel or unexpected calcification. Skull: Normal. Negative for fracture or focal lesion. Sinuses/Orbits: No acute finding. Other: None IMPRESSION: Limited evaluation of the brain due to severe motion artifact and nondiagnostic study of the lower portion of the brain and base of the skull. No acute intracranial pathology identified in the upper portion of the brain. If there is  high clinical concern for acute intracranial pathology, repeat CT is recommended. Electronically Signed   By: Elgie Collard M.D.   On: 08/03/2018 05:46    Procedures Procedures (including critical care time)  Medications Ordered in ED Medications  sodium chloride 0.9 % bolus 500 mL (0 mLs Intravenous Stopped 08/03/18 0603)  ketorolac (TORADOL) 30 MG/ML injection 30 mg (30 mg Intravenous Given  08/03/18 0458)  metoCLOPramide (REGLAN) injection 10 mg (10 mg Intravenous Given 08/03/18 0458)  diphenhydrAMINE (BENADRYL) injection 12.5 mg (12.5 mg Intravenous Given 08/03/18 0458)     Initial Impression / Assessment and Plan / ED Course  I have reviewed the triage vital signs and the nursing notes.  Pertinent labs & imaging results that were available during my care of the patient were reviewed by me and considered in my medical decision making (see chart for details).     Pt HA treated and improved while in ED.  Pt has been able to tolerate fluids without vomiting.  Presentation is like pts typical HA and non concerning for [facility], ICH, Meningitis, or temporal arteritis. Pt did have a fall last week and did not obtain the CT ordered for her.  CT here has motion artifact, but no evidence of ICH.  I personally evaluated the images.  Pt is afebrile with no focal neuro deficits, nuchal rigidity. Pt reports no vision changes today.  Pt is to follow up with neurology to discuss prophylactic medication. Pt verbalizes understanding and is agreeable with plan to dc.    Final Clinical Impressions(s) / ED Diagnoses   Final diagnoses:  Bad headache  Minor head trauma    ED Discharge Orders    None       Mardene Sayer Boyd Kerbs 08/03/18 0636    Nira Conn, MD 08/03/18 0730

## 2018-08-03 NOTE — ED Triage Notes (Addendum)
Patient c/o migraine that began yesterday @ 5pm accompanied by N/V. Had recent fall and dx with concussion.

## 2018-08-03 NOTE — Discharge Instructions (Addendum)
1. Medications: usual home medications 2. Treatment: rest, drink plenty of fluids,  3. Follow Up: Please followup with your primary doctor in 2-3 days and neurology in 1 week for discussion of your diagnoses and further evaluation after today's visit; if you do not have a primary care doctor use the resource guide provided to find one; Please return to the ER for worsening headache, vision changes or other concerns

## 2018-08-08 ENCOUNTER — Encounter: Payer: Self-pay | Admitting: Neurology

## 2018-08-08 ENCOUNTER — Ambulatory Visit (INDEPENDENT_AMBULATORY_CARE_PROVIDER_SITE_OTHER): Payer: 59 | Admitting: Neurology

## 2018-08-08 VITALS — BP 116/78 | HR 83 | Ht 64.0 in | Wt 209.0 lb

## 2018-08-08 DIAGNOSIS — R519 Headache, unspecified: Secondary | ICD-10-CM

## 2018-08-08 DIAGNOSIS — R51 Headache with orthostatic component, not elsewhere classified: Secondary | ICD-10-CM

## 2018-08-08 DIAGNOSIS — H539 Unspecified visual disturbance: Secondary | ICD-10-CM

## 2018-08-08 DIAGNOSIS — G43109 Migraine with aura, not intractable, without status migrainosus: Secondary | ICD-10-CM | POA: Diagnosis not present

## 2018-08-08 DIAGNOSIS — G43709 Chronic migraine without aura, not intractable, without status migrainosus: Secondary | ICD-10-CM | POA: Diagnosis not present

## 2018-08-08 DIAGNOSIS — G8929 Other chronic pain: Secondary | ICD-10-CM

## 2018-08-08 MED ORDER — TIZANIDINE HCL 4 MG PO TABS: 4.0000 mg | ORAL_TABLET | Freq: Four times a day (QID) | ORAL | 0 refills | Status: DC | PRN

## 2018-08-08 MED ORDER — ERENUMAB-AOOE 140 MG/ML ~~LOC~~ SOAJ: 140.0000 mg | SUBCUTANEOUS | 11 refills | Status: DC

## 2018-08-08 MED ORDER — PROPRANOLOL HCL ER 80 MG PO CP24: 80.0000 mg | ORAL_CAPSULE | Freq: Every day | ORAL | 3 refills | Status: DC

## 2018-08-08 MED FILL — tiZANidine HCL 4 MG TABS: 4 | 5 days supply | Qty: 30 | Fill #0

## 2018-08-08 MED FILL — AIMOVIG 140 MG/ML SOAJ: 140 | 30 days supply | Qty: 1 | Fill #0

## 2018-08-08 MED FILL — PROPRANOLOL ER 80 MG CAP: 80 | 30 days supply | Qty: 30 | Fill #0

## 2018-08-08 NOTE — Patient Instructions (Addendum)
Start Aimovig  Start propranolol after surgery Acute management: Try Tizanidine for headache up to 3x a day watch for sedation, also try zofran   There is increased risk for stroke in women with migraine with aura and a  Contraindication for the combined contraceptive pill for use by women who have migraine with aura, which is in line with World Health Organisation recommendations. The risk for women with migraine without aura is lower and other risk factors like smoking are far more likely to increase stroke risk than migraine. There is a recommendation for no smoking and for the use of low estrogen or progestogen only pills particularly for women with migraine with aura. It is important however that women with migraine who are taking the pill do not decide to suddenly stop taking it without discussing this with their doctor. Please discuss with her OB/GYN.  Tizanidine tablets or capsules What is this medicine? TIZANIDINE (tye ZAN i deen) helps to relieve muscle spasms. It may be used to help in the treatment of multiple sclerosis and spinal cord injury. This medicine may be used for other purposes; ask your health care provider or pharmacist if you have questions. COMMON BRAND NAME(S): Zanaflex What should I tell my health care provider before I take this medicine? They need to know if you have any of these conditions: -kidney disease -liver disease -low blood pressure -mental disorder -an unusual or allergic reaction to tizanidine, other medicines, lactose (tablets only), foods, dyes, or preservatives -pregnant or trying to get pregnant -breast-feeding How should I use this medicine? Take this medicine by mouth with a full glass of water. Take this medicine on an empty stomach, at least 30 minutes before or 2 hours after food. Do not take with food unless you talk with your doctor. Follow the directions on the prescription label. Take your medicine at regular intervals. Do not take your  medicine more often than directed. Do not stop taking except on your doctor's advice. Suddenly stopping the medicine can be very dangerous. Talk to your pediatrician regarding the use of this medicine in children. Patients over 47 years old may have a stronger reaction and need a smaller dose. Overdosage: If you think you have taken too much of this medicine contact a poison control center or emergency room at once. NOTE: This medicine is only for you. Do not share this medicine with others. What if I miss a dose? If you miss a dose, take it as soon as you can. If it is almost time for your next dose, take only that dose. Do not take double or extra doses. What may interact with this medicine? Do not take this medicine with any of the following medications: -ciprofloxacin -cisapride -dofetilide -dronedarone -fluvoxamine -narcotic medicines for cough -pimozide -thiabendazole -thioridazine -ziprasidone This medicine may also interact with the following medications: -acyclovir -alcohol -antihistamines for allergy, cough and cold -baclofen -certain antibiotics like levofloxacin, ofloxacin -certain medicines for anxiety or sleep -certain medicines for blood pressure, heart disease, irregular heart beat -certain medicines for depression like amitriptyline, fluoxetine, sertraline -certain medicines for seizures like phenobarbital, primidone -certain medicines for stomach problems like cimetidine, famotidine -female hormones, like estrogens or progestins and birth control pills, patches, rings, or injections -general anesthetics like halothane, isoflurane, methoxyflurane, propofol -local anesthetics like lidocaine, pramoxine, tetracaine -medicines that relax muscles for surgery -narcotic medicines for pain -other medicines that prolong the QT interval (cause an abnormal heart rhythm) -phenothiazines like chlorpromazine, mesoridazine, prochlorperazine -ticlopidine -zileuton This list  may  not describe all possible interactions. Give your health care provider a list of all the medicines, herbs, non-prescription drugs, or dietary supplements you use. Also tell them if you smoke, drink alcohol, or use illegal drugs. Some items may interact with your medicine. What should I watch for while using this medicine? Tell your doctor or health care professional if your symptoms do not start to get better or if they get worse. You may get drowsy or dizzy. Do not drive, use machinery, or do anything that needs mental alertness until you know how this medicine affects you. Do not stand or sit up quickly, especially if you are an older patient. This reduces the risk of dizzy or fainting spells. Alcohol may interfere with the effect of this medicine. Avoid alcoholic drinks. If you are taking another medicine that also causes drowsiness, you may have more side effects. Give your health care provider a list of all medicines you use. Your doctor will tell you how much medicine to take. Do not take more medicine than directed. Call emergency for help if you have problems breathing or unusual sleepiness. Your mouth may get dry. Chewing sugarless gum or sucking hard candy, and drinking plenty of water may help. Contact your doctor if the problem does not go away or is severe. What side effects may I notice from receiving this medicine? Side effects that you should report to your doctor or health care professional as soon as possible: -allergic reactions like skin rash, itching or hives, swelling of the face, lips, or tongue -breathing problems -hallucinations -signs and symptoms of liver injury like dark yellow or brown urine; general ill feeling or flu-like symptoms; light-colored stools; loss of appetite; nausea; right upper quadrant belly pain; unusually weak or tired; yellowing of the eyes or skin -signs and symptoms of low blood pressure like dizziness; feeling faint or lightheaded, falls; unusually  weak or tired -unusually slow heartbeat -unusually weak or tired Side effects that usually do not require medical attention (report to your doctor or health care professional if they continue or are bothersome): -blurred vision -constipation -dizziness -dry mouth -tiredness This list may not describe all possible side effects. Call your doctor for medical advice about side effects. You may report side effects to FDA at 1-800-FDA-1088. Where should I keep my medicine? Keep out of the reach of children. Store at room temperature between 15 and 30 degrees C (59 and 86 degrees F). Throw away any unused medicine after the expiration date. NOTE: This sheet is a summary. It may not cover all possible information. If you have questions about this medicine, talk to your doctor, pharmacist, or health care provider.  2018 Elsevier/Gold Standard (2015-09-01 13:52:12)  Propranolol extended-release capsules What is this medicine? PROPRANOLOL (proe PRAN oh lole) is a beta-blocker. Beta-blockers reduce the workload on the heart and help it to beat more regularly. This medicine is used to treat high blood pressure, heart muscle disease, and prevent chest pain caused by angina. It is also used to prevent migraine headaches. You should not use this medicine to treat a migraine that has already started. This medicine may be used for other purposes; ask your health care provider or pharmacist if you have questions. COMMON BRAND NAME(S): Inderal LA, Inderal XL, InnoPran XL What should I tell my health care provider before I take this medicine? They need to know if you have any of these conditions: -circulation problems, or blood vessel disease -diabetes -history of heart attack or heart disease, vasospastic  angina -kidney disease -liver disease -lung or breathing disease, like asthma or emphysema -pheochromocytoma -slow heart rate -thyroid disease -an unusual or allergic reaction to propranolol, other  beta-blockers, medicines, foods, dyes, or preservatives -pregnant or trying to get pregnant -breast-feeding How should I use this medicine? Take this medicine by mouth with a glass of water. Follow the directions on the prescription label. Do not crush or chew. Take your doses at regular intervals. Do not take your medicine more often than directed. Do not stop taking except on the advice of your doctor or health care professional. Talk to your pediatrician regarding the use of this medicine in children. Special care may be needed. Overdosage: If you think you have taken too much of this medicine contact a poison control center or emergency room at once. NOTE: This medicine is only for you. Do not share this medicine with others. What if I miss a dose? If you miss a dose, take it as soon as you can. If it is almost time for your next dose, take only that dose. Do not take double or extra doses. What may interact with this medicine? Do not take this medicine with any of the following medications: -feverfew -phenothiazines like chlorpromazine, mesoridazine, prochlorperazine, thioridazine This medicine may also interact with the following medications: -aluminum hydroxide gel -antipyrine -antiviral medicines for HIV or AIDS -barbiturates like phenobarbital -certain medicines for blood pressure, heart disease, irregular heart beat -cimetidine -ciprofloxacin -diazepam -fluconazole -haloperidol -isoniazid -medicines for cholesterol like cholestyramine or colestipol -medicines for mental depression -medicines for migraine headache like almotriptan, eletriptan, frovatriptan, naratriptan, rizatriptan, sumatriptan, zolmitriptan -NSAIDs, medicines for pain and inflammation, like ibuprofen or naproxen -phenytoin -rifampin -teniposide -theophylline -thyroid medicines -tolbutamide -warfarin -zileuton This list may not describe all possible interactions. Give your health care provider a list of  all the medicines, herbs, non-prescription drugs, or dietary supplements you use. Also tell them if you smoke, drink alcohol, or use illegal drugs. Some items may interact with your medicine. What should I watch for while using this medicine? Visit your doctor or health care professional for regular check ups. Contact your doctor right away if your symptoms worsen. Check your blood pressure and pulse rate regularly. Ask your health care professional what your blood pressure and pulse rate should be, and when you should contact them. Do not stop taking this medicine suddenly. This could lead to serious heart-related effects. You may get drowsy or dizzy. Do not drive, use machinery, or do anything that needs mental alertness until you know how this drug affects you. Do not stand or sit up quickly, especially if you are an older patient. This reduces the risk of dizzy or fainting spells. Alcohol can make you more drowsy and dizzy. Avoid alcoholic drinks. This medicine can affect blood sugar levels. If you have diabetes, check with your doctor or health care professional before you change your diet or the dose of your diabetic medicine. Do not treat yourself for coughs, colds, or pain while you are taking this medicine without asking your doctor or health care professional for advice. Some ingredients may increase your blood pressure. What side effects may I notice from receiving this medicine? Side effects that you should report to your doctor or health care professional as soon as possible: -allergic reactions like skin rash, itching or hives, swelling of the face, lips, or tongue -breathing problems -changes in blood sugar -cold hands or feet -difficulty sleeping, nightmares -dry peeling skin -hallucinations -muscle cramps or weakness -slow heart  rate -swelling of the legs and ankles -vomiting Side effects that usually do not require medical attention (report to your doctor or health care  professional if they continue or are bothersome): -change in sex drive or performance -diarrhea -dry sore eyes -hair loss -nausea -weak or tired This list may not describe all possible side effects. Call your doctor for medical advice about side effects. You may report side effects to FDA at 1-800-FDA-1088. Where should I keep my medicine? Keep out of the reach of children. Store at room temperature between 15 and 30 degrees C (59 and 86 degrees F). Protect from light, moisture and freezing. Keep container tightly closed. Throw away any unused medicine after the expiration date. NOTE: This sheet is a summary. It may not cover all possible information. If you have questions about this medicine, talk to your doctor, pharmacist, or health care provider.  2018 Elsevier/Gold Standard (2013-07-26 14:58:56)

## 2018-08-08 NOTE — Progress Notes (Deleted)
Subjective:    Patient ID: [name] Sanchez, female    DOB: 08-30-1996, 22 y.o.   MRN: [id number]  HPI: 07/30/18 OV:  [name] Sanchez presents with acute HA r/t to fall on 07/21/18 (two saturdays ago). She was walking up stairs in her garage and turned to speak to her husband. She stumbled and feel, striking her head on cabinets. She denies LOC, however reports sig L occipital pain that immediately developed. She remained on steps after falling for several minutes, then was able to stand. She did not seek immediate medical assistance, this is her first encounter with healthcare for this issue. She reports constant L occipital pain, rated 6/10 and described as "a throbbing/ache" She reports increase in migraine with aura and  persistent nausea w/o vomiting since fall injury. She reports dizziness and "mental fogginess", as well as, sensitivity to computer screens. She denies hx of vertigo She denies previous head injury or hx of concussion  08/13/18 OV: [name] Sanchez is here for f/u for acute HA r/t traumatic fall. She was seen at ED  Patient Care Team    Relationship Specialty Notifications Start End  William Hamburger D, NP PCP - General Family Medicine  07/17/18   Reva Bores, MD Consulting Physician Obstetrics and Gynecology  07/17/18     Patient Active Problem List   Diagnosis Date Noted  . Acute post-traumatic headache, not intractable 07/30/2018  . Pelvic pain 07/31/2017  . Chronic migraine without aura without status migrainosus, not intractable 05/06/2016  . Anxiety and depression 04/04/2012  . Healthcare maintenance 04/01/2012  . Migraine with aura      Past Medical History:  Diagnosis Date  . Anxiety   . Complication of anesthesia    wakes up crying  . Depression   . Endometriosis   . Family history of adverse reaction to anesthesia    hard to wake up, low heart rate  . Gestational hypertension 01/2012   also had postpartum preelampsia  . History of stomach ulcers   . Hx  of ovarian cyst    x 2-3  . Hx of varicella   . IBS (irritable bowel syndrome)   . Migraine    chronic  . Panic attack   . Pinched nerve    left arm  . Plica of knee 08/2012   medial plica left knee     Past Surgical History:  Procedure Laterality Date  . KNEE ARTHROSCOPY  08/17/2012   Procedure: ARTHROSCOPY KNEE;  Surgeon: Harvie Junior, MD;  Location: [facility];  Service: Orthopedics;  Laterality: Left;  left knee scope with tight lateral retinacular release and plica removal  . TYMPANOSTOMY TUBE PLACEMENT Bilateral 1998     Family History  Problem Relation Age of Onset  . Hypertension Mother   . Heart disease Mother        MI  . Anesthesia problems Mother        post-op N/V  . Depression Mother   . Anxiety disorder Mother   . Obesity Mother   . Congestive Heart Failure Mother   . Asthma Sister   . Bipolar disorder Sister   . Asthma Brother   . Diabetes Maternal Aunt   . Heart disease Father   . Bipolar disorder Father   . Hyperlipidemia Father   . Uterine cancer Maternal Grandmother   . Pancreatic cancer Maternal Grandmother   . Lung cancer Maternal Grandfather      Social History   Substance and  Sexual Activity  Drug Use No     Social History   Substance and Sexual Activity  Alcohol Use Not Currently  . Alcohol/week: 0.0 standard drinks   Comment: occassionally     Social History   Tobacco Use  Smoking Status Never Smoker  Smokeless Tobacco Never Used     Outpatient Encounter Medications as of 08/13/2018  Medication Sig Note  . acetaminophen (TYLENOL 8 HOUR) 650 MG CR tablet Take 1 tablet (650 mg total) by mouth every 8 (eight) hours as needed. (Patient taking differently: Take 650 mg by mouth every 8 (eight) hours as needed for pain. )   . ALPRAZolam (XANAX) 0.5 MG tablet Take 1 tablet (0.5 mg total) by mouth daily as needed for anxiety.   Dorise Hiss (AIMOVIG) 140 MG/ML SOAJ Inject 140 mg into the skin every 30 (thirty)  days.   Marland Kitchen FLUoxetine (PROZAC) 40 MG capsule Take 1 capsule (40 mg total) by mouth daily.   Marland Kitchen ibuprofen (ADVIL,MOTRIN) 600 MG tablet Take 1 tablet (600 mg total) by mouth every 6 (six) hours as needed. (Patient taking differently: Take 600 mg by mouth every 6 (six) hours as needed for moderate pain. )   . ondansetron (ZOFRAN) 4 MG tablet Take 4 mg by mouth every 8 (eight) hours as needed for nausea or vomiting.    Marland Kitchen PROAIR HFA 108 (90 Base) MCG/ACT inhaler Inhale 1-2 puffs into the lungs every 4 (four) hours as needed for wheezing or shortness of breath.  08/08/2018: Only took when she had bronchitis  . promethazine (PHENERGAN) 25 MG tablet Take 25 mg by mouth every 6 (six) hours as needed for nausea or vomiting.   . propranolol ER (INDERAL LA) 80 MG 24 hr capsule Take 1 capsule (80 mg total) by mouth daily.   Marland Kitchen tiZANidine (ZANAFLEX) 4 MG tablet Take 1 tablet (4 mg total) by mouth every 6 (six) hours as needed for muscle spasms. Or headaches.    No facility-administered encounter medications on file as of 08/13/2018.     Allergies: Percocet [oxycodone-acetaminophen]; Cefuroxime axetil; and Penicillins  There is no height or weight on file to calculate BMI.  Last menstrual period 07/15/2018. Review of Systems  Constitutional: Positive for activity change, appetite change and fatigue. Negative for chills, diaphoresis, fever and unexpected weight change.  HENT: Negative for congestion and postnasal drip.   Eyes: Negative for visual disturbance.  Respiratory: Negative for cough, chest tightness, shortness of breath, wheezing and stridor.   Cardiovascular: Negative for chest pain and palpitations.  Gastrointestinal: Positive for nausea. Negative for abdominal distention, abdominal pain, blood in stool, constipation, diarrhea and vomiting.  Endocrine: Negative for cold intolerance, heat intolerance, polydipsia, polyphagia and polyuria.  Genitourinary: Negative for difficulty urinating and frequency.    Musculoskeletal: Positive for myalgias, neck pain and neck stiffness. Negative for gait problem.  Skin: Negative for color change, pallor, rash and wound.  Neurological: Positive for dizziness and headaches. Negative for tremors, seizures, syncope, facial asymmetry, speech difficulty, weakness, light-headedness and numbness.  Hematological: Does not bruise/bleed easily.  Psychiatric/Behavioral: Positive for confusion, decreased concentration and sleep disturbance. Negative for agitation, behavioral problems, dysphoric mood, hallucinations, self-injury and suicidal ideas. The patient is not nervous/anxious and is not hyperactive.        Objective:   Physical Exam  Constitutional: She is oriented to person, place, and time. She appears well-developed and well-nourished. No distress.  HENT:  Head: Normocephalic and atraumatic. Not macrocephalic and not microcephalic. Head is without raccoon's  eyes, without Battle's sign, without abrasion, without contusion, without right periorbital erythema and without left periorbital erythema.    Right Ear: External ear normal. Tympanic membrane is not erythematous and not bulging. No decreased hearing is noted.  Left Ear: External ear normal. Tympanic membrane is not erythematous and not bulging. No decreased hearing is noted.  Nose: Nose normal. No mucosal edema or rhinorrhea. Right sinus exhibits no maxillary sinus tenderness and no frontal sinus tenderness. Left sinus exhibits no maxillary sinus tenderness and no frontal sinus tenderness.  Mouth/Throat: Oropharynx is clear and moist.  Tender to touch over L occipital region No contusion/hematoma noted No laceration or open tissue noted  Eyes: Pupils are equal, round, and reactive to light. Conjunctivae, EOM and lids are normal. Lids are everted and swept, no foreign bodies found. Right conjunctiva is not injected. Right conjunctiva has no hemorrhage. Left conjunctiva is not injected. Left conjunctiva has  no hemorrhage.  Neck: Normal range of motion. Neck supple.  Cardiovascular: Normal rate, regular rhythm, normal heart sounds and intact distal pulses.  No murmur heard. Pulmonary/Chest: Effort normal and breath sounds normal. No stridor. No respiratory distress. She has no wheezes. She has no rales. She exhibits no tenderness.  Musculoskeletal: Normal range of motion.  Lymphadenopathy:    She has no cervical adenopathy.  Neurological: She is alert and oriented to person, place, and time. She displays normal reflexes. No cranial nerve deficit or sensory deficit. She exhibits normal muscle tone. Coordination normal.  Skin: Skin is warm and dry. Capillary refill takes less than 2 seconds. No rash noted. She is not diaphoretic. No erythema. No pallor.  Psychiatric: She has a normal mood and affect. Her behavior is normal. Judgment and thought content normal.          Assessment & Plan:   No diagnosis found.  No problem-specific Assessment & Plan notes found for this encounter.  Pt was in the office today for 25+ minutes,I spent > 75% of  Time in face to face counseling of patient's various medical conditions and in coordination of care  FOLLOW-UP:  No follow-ups on file.

## 2018-08-08 NOTE — Progress Notes (Signed)
[facility]    Provider:  Dr Lucia Gaskins Referring Provider: Julaine Fusi, NP Primary Care Physician:  Julaine Fusi, NP  CC:  Chronic migraines  HPI:  [name] Sanchez is a 22 y.o. female here as requested by Dr. Maryfrances Bunnell for chronic migraines.  Past medical history of migraine, depression, anxiety. Migraines since a child. No Fhx of migraies that she knows of. Migraines really started in HS. Seen by Neuro Dr. Sherryll Burger and here Dr. Neale Burly in [place] and tried nerve blocks, topamax, trigger point injections, sphenopalatine ganglion block. Triptans made it worse. Also tried Amitriptyline. She had an amazing response to Aimovig will reorder.  Daily headaches with superimposed migraines 15 migraine days a month, severe, worsening, last 1-2 days, pounding/pulsating/throbbing, +light sensitivity/ noise sensitivity, nausea has vomited movement akes it worse. She has black and white spots of aura.    Reviewed notes, labs and imaging from outside physicians, which showed:  Reviewed notes from [facility].  Patient was first seen in August of this year as a new patient.  Patient reported migraine with aura estimated to have 2-3 headaches per week she was followed by [facility] but is coming to [place] because of health insurance coverage she is not currently on any migraine Rx.  She also has chronic anxiety and depression.  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  Exam was unremarkable.  Reviewed emergency room notes from August 03, 2018 for progressively worsening left-sided headache the same as previous migraines with associated nausea vomiting photophobia and phonophobia previously followed by a neurologist in [place].  Not on any preventative or abortive therapies.  She is been going to urgent care for IM Toradol and Phenergan when her headaches get bad.  She also fell down the stairs 1 week prior and hit her head with intermittent blurred vision and an increase in her  migraine since the falls.  Reviewed CT scan report 08/03/2018: IMPRESSION: Limited evaluation of the brain due to severe motion artifact and nondiagnostic study of the lower portion of the brain and base of the skull. No acute intracranial pathology identified in the upper portion of the brain. If there is high clinical concern for acute intracranial pathology, repeat CT is recommended.  Review of Systems: Patient complains of symptoms per HPI as well as the following symptoms: Fatigue, eye pain, easy bruising, joint pain, aching muscles, frequent infections, headache, numbness, weakness, dizziness, depression, anxiety, insomnia, shift work, restless legs. Pertinent negatives and positives per HPI. All others negative.   Social History   Socioeconomic History  . Marital status: Married    Spouse name: Will  . Number of children: 2  . Years of education: Not on file  . Highest education level: Some college, no degree  Occupational History  . Occupation: Furniture conservator/restorer: [employer]  Social Needs  . Financial resource strain: Not on file  . Food insecurity:    Worry: Not on file    Inability: Not on file  . Transportation needs:    Medical: Not on file    Non-medical: Not on file  Tobacco Use  . Smoking status: Never Smoker  . Smokeless tobacco: Never Used  Substance and Sexual Activity  . Alcohol use: Not Currently    Alcohol/week: 0.0 standard drinks    Comment: occassionally  . Drug use: No  . Sexual activity: Yes    Partners: Male    Birth control/protection: None, Surgical    Comment:  last night  Lifestyle  . Physical activity:    Days per week: Not on file    Minutes per session: Not on file  . Stress: Not on file  Relationships  . Social connections:    Talks on phone: Not on file    Gets together: Not on file    Attends religious service: Not on file    Active member of club or organization: Not on file    Attends meetings of clubs or organizations:  Not on file    Relationship status: Not on file  . Intimate partner violence:    Fear of current or ex partner: Not on file    Emotionally abused: Not on file    Physically abused: Not on file    Forced sexual activity: Not on file  Other Topics Concern  . Not on file  Social History Narrative   Lives at home with her husband and children   Right handed   Caffeine 3 cups daily    Family History  Problem Relation Age of Onset  . Hypertension Mother   . Heart disease Mother        MI  . Anesthesia problems Mother        post-op N/V  . Depression Mother   . Anxiety disorder Mother   . Obesity Mother   . Congestive Heart Failure Mother   . Asthma Sister   . Bipolar disorder Sister   . Asthma Brother   . Diabetes Maternal Aunt   . Heart disease Father   . Bipolar disorder Father   . Hyperlipidemia Father   . Uterine cancer Maternal Grandmother   . Pancreatic cancer Maternal Grandmother   . Lung cancer Maternal Grandfather     Past Medical History:  Diagnosis Date  . Anxiety   . Complication of anesthesia    wakes up crying  . Depression   . Endometriosis   . Family history of adverse reaction to anesthesia    hard to wake up, low heart rate  . Gestational hypertension 01/2012   also had postpartum preelampsia  . History of stomach ulcers   . Hx of ovarian cyst    x 2-3  . Hx of varicella   . IBS (irritable bowel syndrome)   . Migraine    chronic  . Panic attack   . Pinched nerve    left arm  . Plica of knee 08/2012   medial plica left knee    Past Surgical History:  Procedure Laterality Date  . KNEE ARTHROSCOPY  08/17/2012   Procedure: ARTHROSCOPY KNEE;  Surgeon: Harvie Junior, MD;  Location: [facility];  Service: Orthopedics;  Laterality: Left;  left knee scope with tight lateral retinacular release and plica removal  . TYMPANOSTOMY TUBE PLACEMENT Bilateral 1998    Current Outpatient Medications  Medication Sig Dispense Refill  .  FLUoxetine (PROZAC) 40 MG capsule Take 1 capsule (40 mg total) by mouth daily. 90 capsule 3  . acetaminophen (TYLENOL 8 HOUR) 650 MG CR tablet Take 1 tablet (650 mg total) by mouth every 8 (eight) hours as needed. (Patient taking differently: Take 650 mg by mouth every 8 (eight) hours as needed for pain. ) 30 tablet 0  . ALPRAZolam (XANAX) 0.5 MG tablet Take 1 tablet (0.5 mg total) by mouth daily as needed for anxiety. 20 tablet 0  . Erenumab-aooe (AIMOVIG) 140 MG/ML SOAJ Inject 140 mg into the skin every 30 (thirty) days. 1 pen 11  .  ibuprofen (ADVIL,MOTRIN) 600 MG tablet Take 1 tablet (600 mg total) by mouth every 6 (six) hours as needed. (Patient taking differently: Take 600 mg by mouth every 6 (six) hours as needed for moderate pain. ) 30 tablet 0  . ondansetron (ZOFRAN) 4 MG tablet Take 4 mg by mouth every 8 (eight) hours as needed for nausea or vomiting.   0  . PROAIR HFA 108 (90 Base) MCG/ACT inhaler Inhale 1-2 puffs into the lungs every 4 (four) hours as needed for wheezing or shortness of breath.   0  . promethazine (PHENERGAN) 25 MG tablet Take 25 mg by mouth every 6 (six) hours as needed for nausea or vomiting.    . propranolol ER (INDERAL LA) 80 MG 24 hr capsule Take 1 capsule (80 mg total) by mouth daily. 30 capsule 3  . tiZANidine (ZANAFLEX) 4 MG tablet Take 1 tablet (4 mg total) by mouth every 6 (six) hours as needed for muscle spasms. Or headaches. 30 tablet 0   No current facility-administered medications for this visit.     Allergies as of 08/08/2018 - Review Complete 08/08/2018  Allergen Reaction Noted  . Percocet [oxycodone-acetaminophen] Hives and Itching 08/01/2018  . Cefuroxime axetil Rash 09/04/2011  . Penicillins Rash 07/17/2018    Vitals: BP 116/78 (BP Location: Left Arm, Patient Position: Sitting)   Pulse 83   Ht 5\' 4"  (1.626 m)   Wt 209 lb (94.8 kg)   LMP 07/15/2018 (Exact Date)   BMI 35.87 kg/m  Last Weight:  Wt Readings from Last 1 Encounters:  08/08/18  209 lb (94.8 kg)   Last Height:   Ht Readings from Last 1 Encounters:  08/08/18 5\' 4"  (1.626 m)   Physical exam: Exam: Gen: NAD, conversant, well nourised, obese, well groomed                     CV: RRR, no MRG. No Carotid Bruits. No peripheral edema, warm, nontender Eyes: Conjunctivae clear without exudates or hemorrhage  Neuro: Detailed Neurologic Exam  Speech:    Speech is normal; fluent and spontaneous with normal comprehension.  Cognition:    The patient is oriented to person, place, and time;     recent and remote memory intact;     language fluent;     normal attention, concentration,     fund of knowledge Cranial Nerves:    The pupils are equal, round, and reactive to light. The fundi are normal and spontaneous venous pulsations are present. Visual fields are full to finger confrontation. Extraocular movements are intact. Trigeminal sensation is intact and the muscles of mastication are normal. The face is symmetric. The palate elevates in the midline. Hearing intact. Voice is normal. Shoulder shrug is normal. The tongue has normal motion without fasciculations.   Coordination:    Normal finger to nose and heel to shin. Normal rapid alternating movements.   Gait:    Heel-toe and tandem gait are normal.   Motor Observation:    No asymmetry, no atrophy, and no involuntary movements noted. Tone:    Normal muscle tone.    Posture:    Posture is normal. normal erect    Strength:    Strength is V/V in the upper and lower limbs.      Sensation: intact to LT     Reflex Exam:  DTR's:    Deep tendon reflexes in the upper and lower extremities are normal bilaterally.   Toes:    The toes are  downgoing bilaterally.   Clonus:    Clonus is absent.       Assessment/Plan:  22 year old with migraines with and without aura  MRI brain due to concerning symptoms of morning headaches, positional headaches,vision changes  to look for space occupying mass, chiari or  intracranial hypertension (pseudotumor).  Start Aimovig for prevention of migraine Start propranolol after surgery for prevention of migraine, may also help with anxiety Acute management migraine: Try Tizanidine for headache up to 3x a day watch for sedation, also try zofran   Discussed: There is increased risk for stroke in women with migraine with aura and a  Contraindication for the combined contraceptive pill for use by women who have migraine with aura, which is in line with World Health Organisation recommendations. The risk for women with migraine without aura is lower and other risk factors like smoking are far more likely to increase stroke risk than migraine. There is a recommendation for no smoking and for the use of low estrogen or progestogen only pills particularly for women with migraine with aura. It is important however that women with migraine who are taking the pill do not decide to suddenly stop taking it without discussing this with their doctor. Please discuss with her OB/GYN.  Discussed: To prevent or relieve headaches, try the following: Cool Compress. Lie down and place a cool compress on your head.  Avoid headache triggers. If certain foods or odors seem to have triggered your migraines in the past, avoid them. A headache diary might help you identify triggers.  Include physical activity in your daily routine. Try a daily walk or other moderate aerobic exercise.  Manage stress. Find healthy ways to cope with the stressors, such as delegating tasks on your to-do list.  Practice relaxation techniques. Try deep breathing, yoga, massage and visualization.  Eat regularly. Eating regularly scheduled meals and maintaining a healthy diet might help prevent headaches. Also, drink plenty of fluids.  Follow a regular sleep schedule. Sleep deprivation might contribute to headaches Consider biofeedback. With this mind-body technique, you learn to control certain bodily functions - such  as muscle tension, heart rate and blood pressure - to prevent headaches or reduce headache pain.    Proceed to emergency room if you experience new or worsening symptoms or symptoms do not resolve, if you have new neurologic symptoms or if headache is severe, or for any concerning symptom.   Provided education and documentation from American headache Society toolbox including articles on: chronic migraine medication overuse headache, chronic migraines, prevention of migraines, behavioral and other nonpharmacologic treatments for headache.  Cc: Jenne Pane, MD  [facility] [address] [place], [place] [address]  Phone [phone] Fax [phone]

## 2018-08-09 ENCOUNTER — Encounter: Payer: Self-pay | Admitting: Neurology

## 2018-08-09 ENCOUNTER — Telehealth: Payer: Self-pay

## 2018-08-09 NOTE — Telephone Encounter (Signed)
PA done on cover my meds for Aimovig. Decision pending with [employer].

## 2018-08-10 NOTE — Telephone Encounter (Signed)
PA approvd for aimovig from 08/08/18-02/05/2019 through [employer] REF number [id number]

## 2018-08-13 ENCOUNTER — Telehealth: Payer: Self-pay | Admitting: Neurology

## 2018-08-13 ENCOUNTER — Ambulatory Visit: Payer: 59 | Admitting: Adult Health

## 2018-08-13 NOTE — Telephone Encounter (Signed)
lvm for pt to call back about scheduling mri  cone [facility] auth: NPR Ref # [id number]

## 2018-08-14 ENCOUNTER — Encounter (HOSPITAL_COMMUNITY): Payer: Self-pay | Admitting: Certified Registered Nurse Anesthetist

## 2018-08-14 ENCOUNTER — Ambulatory Visit (HOSPITAL_COMMUNITY): Payer: 59 | Admitting: Anesthesiology

## 2018-08-14 ENCOUNTER — Ambulatory Visit (HOSPITAL_COMMUNITY)
Admission: AD | Admit: 2018-08-14 | Discharge: 2018-08-15 | Disposition: A | Discharge status: Home / Self Care | Payer: 59 | Source: Ambulatory Visit | Attending: Family Medicine | Admitting: Family Medicine

## 2018-08-14 ENCOUNTER — Other Ambulatory Visit: Payer: Self-pay

## 2018-08-14 ENCOUNTER — Encounter (HOSPITAL_COMMUNITY)
Admission: AD | Disposition: A | Discharge status: Home / Self Care | Payer: Self-pay | Source: Ambulatory Visit | Attending: Family Medicine

## 2018-08-14 DIAGNOSIS — Z885 Allergy status to narcotic agent status: Secondary | ICD-10-CM | POA: Insufficient documentation

## 2018-08-14 DIAGNOSIS — Z8249 Family history of ischemic heart disease and other diseases of the circulatory system: Secondary | ICD-10-CM | POA: Diagnosis not present

## 2018-08-14 DIAGNOSIS — R102 Pelvic and perineal pain: Secondary | ICD-10-CM | POA: Diagnosis present

## 2018-08-14 DIAGNOSIS — M6752 Plica syndrome, left knee: Secondary | ICD-10-CM | POA: Insufficient documentation

## 2018-08-14 DIAGNOSIS — Z8049 Family history of malignant neoplasm of other genital organs: Secondary | ICD-10-CM | POA: Diagnosis not present

## 2018-08-14 DIAGNOSIS — Z818 Family history of other mental and behavioral disorders: Secondary | ICD-10-CM | POA: Insufficient documentation

## 2018-08-14 DIAGNOSIS — Z79899 Other long term (current) drug therapy: Secondary | ICD-10-CM | POA: Diagnosis not present

## 2018-08-14 DIAGNOSIS — Z800 Family history of malignant neoplasm of digestive organs: Secondary | ICD-10-CM | POA: Diagnosis not present

## 2018-08-14 DIAGNOSIS — G43909 Migraine, unspecified, not intractable, without status migrainosus: Secondary | ICD-10-CM | POA: Diagnosis not present

## 2018-08-14 DIAGNOSIS — Z801 Family history of malignant neoplasm of trachea, bronchus and lung: Secondary | ICD-10-CM | POA: Insufficient documentation

## 2018-08-14 DIAGNOSIS — K589 Irritable bowel syndrome without diarrhea: Secondary | ICD-10-CM | POA: Diagnosis not present

## 2018-08-14 DIAGNOSIS — E669 Obesity, unspecified: Secondary | ICD-10-CM | POA: Diagnosis not present

## 2018-08-14 DIAGNOSIS — Z90710 Acquired absence of both cervix and uterus: Secondary | ICD-10-CM | POA: Diagnosis present

## 2018-08-14 DIAGNOSIS — G8929 Other chronic pain: Secondary | ICD-10-CM | POA: Insufficient documentation

## 2018-08-14 DIAGNOSIS — F410 Panic disorder [episodic paroxysmal anxiety] without agoraphobia: Secondary | ICD-10-CM | POA: Diagnosis not present

## 2018-08-14 DIAGNOSIS — I10 Essential (primary) hypertension: Secondary | ICD-10-CM | POA: Insufficient documentation

## 2018-08-14 DIAGNOSIS — F329 Major depressive disorder, single episode, unspecified: Secondary | ICD-10-CM | POA: Diagnosis not present

## 2018-08-14 DIAGNOSIS — Z8719 Personal history of other diseases of the digestive system: Secondary | ICD-10-CM | POA: Insufficient documentation

## 2018-08-14 DIAGNOSIS — Z880 Allergy status to penicillin: Secondary | ICD-10-CM | POA: Diagnosis not present

## 2018-08-14 DIAGNOSIS — Z881 Allergy status to other antibiotic agents status: Secondary | ICD-10-CM | POA: Diagnosis not present

## 2018-08-14 DIAGNOSIS — Z836 Family history of other diseases of the respiratory system: Secondary | ICD-10-CM | POA: Diagnosis not present

## 2018-08-14 DIAGNOSIS — F419 Anxiety disorder, unspecified: Secondary | ICD-10-CM | POA: Insufficient documentation

## 2018-08-14 DIAGNOSIS — Z833 Family history of diabetes mellitus: Secondary | ICD-10-CM | POA: Insufficient documentation

## 2018-08-14 HISTORY — PX: VAGINAL HYSTERECTOMY: SHX2639

## 2018-08-14 LAB — PREGNANCY, URINE: PREG TEST UR: NEGATIVE

## 2018-08-14 SURGERY — HYSTERECTOMY, VAGINAL
Anesthesia: General | Site: Vagina | Laterality: Bilateral

## 2018-08-14 MED ORDER — PROPOFOL 10 MG/ML IV BOLUS
INTRAVENOUS | Status: AC
  Filled 2018-08-14: qty 20

## 2018-08-14 MED ORDER — KETOROLAC TROMETHAMINE 30 MG/ML IJ SOLN
INTRAMUSCULAR | Status: AC
  Administered 2018-08-14: 30 mg via INTRAVENOUS
  Filled 2018-08-14: qty 1

## 2018-08-14 MED ORDER — SODIUM CHLORIDE 0.9 % IR SOLN
Status: DC | PRN
  Administered 2018-08-14: 1000 mL

## 2018-08-14 MED ORDER — LIDOCAINE HCL (CARDIAC) PF 100 MG/5ML IV SOSY
PREFILLED_SYRINGE | INTRAVENOUS | Status: DC | PRN
  Administered 2018-08-14: 80 mg via INTRAVENOUS

## 2018-08-14 MED ORDER — PROPOFOL 10 MG/ML IV BOLUS
INTRAVENOUS | Status: DC | PRN
  Administered 2018-08-14: 160 mg via INTRAVENOUS

## 2018-08-14 MED ORDER — GLYCOPYRROLATE 0.2 MG/ML IJ SOLN
INTRAMUSCULAR | Status: DC | PRN
  Administered 2018-08-14 (×2): 0.1 mg via INTRAVENOUS

## 2018-08-14 MED ORDER — MIDAZOLAM HCL 2 MG/2ML IJ SOLN
INTRAMUSCULAR | Status: DC | PRN
  Administered 2018-08-14: 2 mg via INTRAVENOUS

## 2018-08-14 MED ORDER — ONDANSETRON HCL 4 MG/2ML IJ SOLN
INTRAMUSCULAR | Status: DC | PRN
  Administered 2018-08-14: 4 mg via INTRAVENOUS

## 2018-08-14 MED ORDER — LACTATED RINGERS IV SOLN
INTRAVENOUS | Status: DC
  Administered 2018-08-14 (×2): via INTRAVENOUS

## 2018-08-14 MED ORDER — METOCLOPRAMIDE HCL 5 MG/ML IJ SOLN
INTRAMUSCULAR | Status: DC | PRN
  Administered 2018-08-14: 10 mg via INTRAVENOUS

## 2018-08-14 MED ORDER — EPHEDRINE 5 MG/ML INJ
INTRAVENOUS | Status: AC
  Filled 2018-08-14: qty 10

## 2018-08-14 MED ORDER — KETOROLAC TROMETHAMINE 30 MG/ML IJ SOLN
INTRAMUSCULAR | Status: AC
  Filled 2018-08-14: qty 1

## 2018-08-14 MED ORDER — FENTANYL CITRATE (PF) 100 MCG/2ML IJ SOLN
INTRAMUSCULAR | Status: AC
  Filled 2018-08-14: qty 2

## 2018-08-14 MED ORDER — FENTANYL CITRATE (PF) 100 MCG/2ML IJ SOLN
25.0000 ug | INTRAMUSCULAR | Status: DC | PRN
  Administered 2018-08-14 (×2): 50 ug via INTRAVENOUS

## 2018-08-14 MED ORDER — DEXTROSE 5 % IV SOLN: INTRAVENOUS | Status: DC | PRN

## 2018-08-14 MED ORDER — IBUPROFEN 600 MG PO TABS
600.0000 mg | ORAL_TABLET | Freq: Four times a day (QID) | ORAL | Status: DC | PRN
  Administered 2018-08-14 – 2018-08-15 (×3): 600 mg via ORAL
  Filled 2018-08-14 (×4): qty 1

## 2018-08-14 MED ORDER — MENTHOL 3 MG MT LOZG: 1.0000 | LOZENGE | OROMUCOSAL | Status: DC | PRN

## 2018-08-14 MED ORDER — METOCLOPRAMIDE HCL 5 MG/ML IJ SOLN
INTRAMUSCULAR | Status: AC
  Filled 2018-08-14: qty 2

## 2018-08-14 MED ORDER — SCOPOLAMINE 1 MG/3DAYS TD PT72
1.0000 | MEDICATED_PATCH | Freq: Once | TRANSDERMAL | Status: DC
  Administered 2018-08-14: 1.5 mg via TRANSDERMAL

## 2018-08-14 MED ORDER — ALBUTEROL SULFATE HFA 108 (90 BASE) MCG/ACT IN AERS: 1.0000 | INHALATION_SPRAY | RESPIRATORY_TRACT | Status: DC | PRN

## 2018-08-14 MED ORDER — LIDOCAINE-EPINEPHRINE 1 %-1:100000 IJ SOLN
INTRAMUSCULAR | Status: DC | PRN
  Administered 2018-08-14: 20 mL

## 2018-08-14 MED ORDER — DEXAMETHASONE SODIUM PHOSPHATE 4 MG/ML IJ SOLN
INTRAMUSCULAR | Status: DC | PRN
  Administered 2018-08-14: 8 mg via INTRAVENOUS

## 2018-08-14 MED ORDER — DEXTROSE IN LACTATED RINGERS 5 % IV SOLN
INTRAVENOUS | Status: DC
  Administered 2018-08-14: via INTRAVENOUS

## 2018-08-14 MED ORDER — HYDROMORPHONE HCL 1 MG/ML IJ SOLN
INTRAMUSCULAR | Status: DC | PRN
  Administered 2018-08-14: 1 mg via INTRAVENOUS

## 2018-08-14 MED ORDER — ALPRAZOLAM 0.5 MG PO TABS
0.5000 mg | ORAL_TABLET | Freq: Every day | ORAL | Status: DC | PRN
  Administered 2018-08-14: 0.5 mg via ORAL
  Filled 2018-08-14: qty 1

## 2018-08-14 MED ORDER — LACTATED RINGERS IV SOLN
INTRAVENOUS | Status: DC
  Administered 2018-08-14: 125 mL/h via INTRAVENOUS

## 2018-08-14 MED ORDER — MIDAZOLAM HCL 2 MG/2ML IJ SOLN
INTRAMUSCULAR | Status: AC
  Filled 2018-08-14: qty 2

## 2018-08-14 MED ORDER — LIDOCAINE HCL (CARDIAC) PF 100 MG/5ML IV SOSY
PREFILLED_SYRINGE | INTRAVENOUS | Status: AC
  Filled 2018-08-14: qty 5

## 2018-08-14 MED ORDER — ESTRADIOL 0.1 MG/GM VA CREA
TOPICAL_CREAM | VAGINAL | Status: AC
  Filled 2018-08-14: qty 42.5

## 2018-08-14 MED ORDER — HYDROMORPHONE HCL 1 MG/ML IJ SOLN
INTRAMUSCULAR | Status: AC
  Filled 2018-08-14: qty 1

## 2018-08-14 MED ORDER — LIDOCAINE-EPINEPHRINE 1 %-1:100000 IJ SOLN
INTRAMUSCULAR | Status: AC
  Filled 2018-08-14: qty 1

## 2018-08-14 MED ORDER — KETOROLAC TROMETHAMINE 30 MG/ML IJ SOLN
30.0000 mg | Freq: Once | INTRAMUSCULAR | Status: AC | PRN
  Administered 2018-08-14: 30 mg via INTRAVENOUS

## 2018-08-14 MED ORDER — ROCURONIUM BROMIDE 100 MG/10ML IV SOLN
INTRAVENOUS | Status: AC
  Filled 2018-08-14: qty 1

## 2018-08-14 MED ORDER — GENTAMICIN SULFATE 40 MG/ML IJ SOLN
INTRAVENOUS | Status: AC
  Administered 2018-08-14: 13:00:00 via INTRAVENOUS
  Filled 2018-08-14: qty 8.75

## 2018-08-14 MED ORDER — FLUOXETINE HCL 20 MG PO CAPS
40.0000 mg | ORAL_CAPSULE | Freq: Every day | ORAL | Status: DC
  Filled 2018-08-14: qty 2

## 2018-08-14 MED ORDER — FENTANYL CITRATE (PF) 100 MCG/2ML IJ SOLN
INTRAMUSCULAR | Status: DC | PRN
  Administered 2018-08-14 (×2): 50 ug via INTRAVENOUS
  Administered 2018-08-14: 25 ug via INTRAVENOUS
  Administered 2018-08-14 (×3): 50 ug via INTRAVENOUS

## 2018-08-14 MED ORDER — ALBUTEROL SULFATE (2.5 MG/3ML) 0.083% IN NEBU: 2.5000 mg | INHALATION_SOLUTION | RESPIRATORY_TRACT | Status: DC | PRN

## 2018-08-14 MED ORDER — FENTANYL CITRATE (PF) 250 MCG/5ML IJ SOLN
INTRAMUSCULAR | Status: AC
  Filled 2018-08-14: qty 5

## 2018-08-14 MED ORDER — FENTANYL CITRATE (PF) 100 MCG/2ML IJ SOLN
INTRAMUSCULAR | Status: AC
  Administered 2018-08-14: 50 ug via INTRAVENOUS
  Filled 2018-08-14: qty 2

## 2018-08-14 MED ORDER — DEXAMETHASONE SODIUM PHOSPHATE 4 MG/ML IJ SOLN
INTRAMUSCULAR | Status: AC
  Filled 2018-08-14: qty 1

## 2018-08-14 MED ORDER — ONDANSETRON HCL 4 MG/2ML IJ SOLN
INTRAMUSCULAR | Status: AC
  Filled 2018-08-14: qty 2

## 2018-08-14 MED ORDER — SCOPOLAMINE 1 MG/3DAYS TD PT72
MEDICATED_PATCH | TRANSDERMAL | Status: AC
  Administered 2018-08-14: 1.5 mg via TRANSDERMAL
  Filled 2018-08-14: qty 1

## 2018-08-14 MED ORDER — MEPERIDINE HCL 25 MG/ML IJ SOLN
INTRAMUSCULAR | Status: AC
  Filled 2018-08-14: qty 1

## 2018-08-14 MED ORDER — GLYCOPYRROLATE 0.2 MG/ML IJ SOLN
INTRAMUSCULAR | Status: AC
  Filled 2018-08-14: qty 1

## 2018-08-14 MED ORDER — PROMETHAZINE HCL 25 MG/ML IJ SOLN: 6.2500 mg | INTRAMUSCULAR | Status: DC | PRN

## 2018-08-14 MED ORDER — HYDROMORPHONE HCL 2 MG PO TABS
2.0000 mg | ORAL_TABLET | ORAL | Status: DC | PRN
  Administered 2018-08-14 – 2018-08-15 (×4): 2 mg via ORAL
  Filled 2018-08-14 (×5): qty 1

## 2018-08-14 MED ORDER — MEPERIDINE HCL 25 MG/ML IJ SOLN
6.2500 mg | INTRAMUSCULAR | Status: DC | PRN
  Administered 2018-08-14: 6.25 mg via INTRAVENOUS

## 2018-08-14 SURGICAL SUPPLY — 26 items
CANISTER SUCT 3000ML PPV (MISCELLANEOUS) ×3 IMPLANT
CONT PATH 16OZ SNAP LID 3702 (MISCELLANEOUS) ×3 IMPLANT
DECANTER SPIKE VIAL GLASS SM (MISCELLANEOUS) ×3 IMPLANT
GAUZE PACKING 1 X5 YD ST (GAUZE/BANDAGES/DRESSINGS) ×3 IMPLANT
GAUZE PACKING 2X5 YD STRL (GAUZE/BANDAGES/DRESSINGS) IMPLANT
GLOVE BIOGEL PI IND STRL 6.5 (GLOVE) ×1 IMPLANT
GLOVE BIOGEL PI IND STRL 7.0 (GLOVE) ×2 IMPLANT
GLOVE BIOGEL PI INDICATOR 6.5 (GLOVE) ×2
GLOVE BIOGEL PI INDICATOR 7.0 (GLOVE) ×4
GLOVE ECLIPSE 7.0 STRL STRAW (GLOVE) ×6 IMPLANT
GOWN STRL REUS W/TWL LRG LVL3 (GOWN DISPOSABLE) ×15 IMPLANT
NEEDLE HYPO 22GX1.5 SAFETY (NEEDLE) IMPLANT
NEEDLE SPNL 18GX3.5 QUINCKE PK (NEEDLE) ×3 IMPLANT
NS IRRIG 1000ML POUR BTL (IV SOLUTION) ×3 IMPLANT
PACK VAGINAL WOMENS (CUSTOM PROCEDURE TRAY) ×3 IMPLANT
PAD OB MATERNITY 4.3X12.25 (PERSONAL CARE ITEMS) ×3 IMPLANT
SUT VIC AB 0 CT1 18XCR BRD8 (SUTURE) ×3 IMPLANT
SUT VIC AB 0 CT1 27 (SUTURE) ×4
SUT VIC AB 0 CT1 27XBRD ANBCTR (SUTURE) ×2 IMPLANT
SUT VIC AB 0 CT1 8-18 (SUTURE) ×6
SUT VIC AB 3-0 SH 27 (SUTURE) ×2
SUT VIC AB 3-0 SH 27X BRD (SUTURE) ×1 IMPLANT
SUT VICRYL 0 TIES 12 18 (SUTURE) ×3 IMPLANT
SYR 20CC LL (SYRINGE) ×3 IMPLANT
TOWEL OR 17X24 6PK STRL BLUE (TOWEL DISPOSABLE) ×6 IMPLANT
TRAY FOLEY W/BAG SLVR 14FR (SET/KITS/TRAYS/PACK) ×3 IMPLANT

## 2018-08-14 NOTE — Transfer of Care (Signed)
Immediate Anesthesia Transfer of Care Note  Patient: [name] Sanchez  Procedure(s) Performed: HYSTERECTOMY VAGINAL WITH SALPINGECTOMY (Bilateral Vagina )  Patient Location: PACU  Anesthesia Type:General  Level of Consciousness: drowsy  Airway & Oxygen Therapy: Patient connected to nasal cannula oxygen  Post-op Assessment: Report given to RN and Post -op Vital signs reviewed and stable  Post vital signs: Reviewed and stable  Last Vitals:  Vitals Value Taken Time  BP 105/65 08/14/2018  2:05 PM  Temp 36.8 C 08/14/2018  2:05 PM  Pulse 88 08/14/2018  2:14 PM  Resp 10 08/14/2018  2:14 PM  SpO2 99 % 08/14/2018  2:14 PM  Vitals shown include unvalidated device data.  Last Pain:  Vitals:   08/14/18 1405  TempSrc:   PainSc: Asleep      Patients Stated Pain Goal: 4 (08/14/18 1142)  Complications: No apparent anesthesia complications

## 2018-08-14 NOTE — Anesthesia Preprocedure Evaluation (Signed)
Anesthesia Evaluation  Patient identified by MRN, date of birth, ID band Patient awake    Reviewed: Allergy & Precautions, NPO status , Patient's Chart, lab work & pertinent test results  Airway Mallampati: I       Dental no notable dental hx. (+) Teeth Intact   Pulmonary    Pulmonary exam normal breath sounds clear to auscultation       Cardiovascular hypertension, Normal cardiovascular exam Rhythm:Regular Rate:Normal     Neuro/Psych  Headaches, PSYCHIATRIC DISORDERS Anxiety Depression    GI/Hepatic negative GI ROS, Neg liver ROS,   Endo/Other  negative endocrine ROS  Renal/GU negative Renal ROS  negative genitourinary   Musculoskeletal negative musculoskeletal ROS (+)   Abdominal (+) + obese,   Peds  Hematology negative hematology ROS (+)   Anesthesia Other Findings   Reproductive/Obstetrics negative OB ROS                             Anesthesia Physical Anesthesia Plan  ASA: II  Anesthesia Plan: General   Post-op Pain Management:    Induction: Intravenous  PONV Risk Score and Plan: 4 or greater and Ondansetron, Dexamethasone, Midazolam and Scopolamine patch - Pre-op  Airway Management Planned: LMA  Additional Equipment:   Intra-op Plan:   Post-operative Plan: Extubation in OR  Informed Consent: I have reviewed the patients History and Physical, chart, labs and discussed the procedure including the risks, benefits and alternatives for the proposed anesthesia with the patient or authorized representative who has indicated his/her understanding and acceptance.   Dental advisory given  Plan Discussed with: CRNA and Surgeon  Anesthesia Plan Comments:         Anesthesia Quick Evaluation

## 2018-08-14 NOTE — Discharge Instructions (Signed)
Vaginal Hysterectomy, Care After °Refer to this sheet in the next few weeks. These instructions provide you with information about caring for yourself after your procedure. Your health care provider may also give you more specific instructions. Your treatment has been planned according to current medical practices, but problems sometimes occur. Call your health care provider if you have any problems or questions after your procedure. °What can I expect after the procedure? °After the procedure, it is common to have: °· Pain. °· Soreness and numbness in your incision areas. °· Vaginal bleeding and discharge. °· Constipation. °· Temporary problems emptying the bladder. °· Feelings of sadness or other emotions. ° °Follow these instructions at home: °Medicines °· Take over-the-counter and prescription medicines only as told by your health care provider. °· If you were prescribed an antibiotic medicine, take it as told by your health care provider. Do not stop taking the antibiotic even if you start to feel better. °· Do not drive or operate heavy machinery while taking prescription pain medicine. °Activity °· Return to your normal activities as told by your health care provider. Ask your health care provider what activities are safe for you. °· Get regular exercise as told by your health care provider. You may be told to take short walks every day and go farther each time. °· Do not lift anything that is heavier than 10 lb (4.5 kg). °General instructions ° °· Do not put anything in your vagina for 6 weeks after your surgery or as told by your health care provider. This includes tampons and douches. °· Do not have sex until your health care provider says you can. °· Do not take baths, swim, or use a hot tub until your health care provider approves. °· Drink enough fluid to keep your urine clear or pale yellow. °· Do not drive for 24 hours if you were given a sedative. °· Keep all follow-up visits as told by your health  care provider. This is important. °Contact a health care provider if: °· Your pain medicine is not helping. °· You have a fever. °· You have redness, swelling, or pain at your incision site. °· You have blood, pus, or a bad-smelling discharge from your vagina. °· You continue to have difficulty urinating. °Get help right away if: °· You have severe abdominal or back pain. °· You have heavy bleeding from your vagina. °· You have chest pain or shortness of breath. °This information is not intended to replace advice given to you by your health care provider. Make sure you discuss any questions you have with your health care provider. °Document Released: 03/14/2016 Document Revised: 04/28/2016 Document Reviewed: 12/06/2015 °Elsevier Interactive Patient Education © 2018 Elsevier Inc. ° °

## 2018-08-14 NOTE — Op Note (Signed)
Preoperative diagnosis: Pelvic pain  Postoperative diagnosis: Same  Procedure: Transvaginal hysterectomy   Surgeon: Shelbie Proctor. Shawnie Pons, M.D.  Assistant: Catalina Antigua, MD, An experienced assistant was required given the standard of surgical care given the complexity of the case.  This assistant was needed for exposure, dissection, suctioning, retraction, instrument exchange, and for overall help during the procedure.   Anesthesia: General ETT-,Hatchett, Susann Givens, MD, MD  Findings: Normal appearing uterus 137 gms, normal appearing tubes and ovaries.  Estimated blood loss: 100 cc  Specimen: Uterus to pathology  Reason for procedure: Patient had long h/o pelvic pain.  Had tried doxycycline for presumed chronic infection, and trial of OC's. Pain remained and patient was convinced her cervix caused her pain especially during intercourse. The patient desired definitive treatment.  Risks of  hysterectomy reviewed.  Risks include but are not limited to bleeding, infection, injury to surrounding structures, including bowel, bladder and ureters, blood clots, and death.  Likelihood of success of surgery is moderate.   Procedure: Patient was taken to the OR where she was placed in dorsal lithotomy in Allen stirrups. She was prepped and draped in the usual sterile fashion. A timeout was performed. The patient received appropriate antibiotics prior to procedure. The patient had SCDs in place.  A speculum was placed inside the vagina. The cervix was visualized and grasped with 2 doublle-tooth tenacula. 20 cc of 1% lidocaine with epinephrine were injected paracervically. A knife was used to make a circumferential incision around the vagina. An opened sponge was used to dissect the vagina off the cervix. The posterior peritoneum was entered sharply with Mayo scissors. The posterior peritoneum was tagged to the vaginal cuff with a single stitch. The anterior peritoneal cavity was entered sharply with careful  dissection of the bladder off the underlying cervix. A Heaney clamp was used to clamp first the left uterosacral ligament and cardinal which was then cut and Haney suture ligated with 0 Vicryl stitch, the stitch was held. Similarly the right uterosacral ligament was clamped cut and suture ligated. Sequential bites up the broad to the uterine arteries were taken until the tubo-ovarian pedicles were encountered. The uterus was then inverted and the left utero-ovarian pedicle grasped with a Heaney clamp. The right utero-ovarian pedicle was similarly grasped with the Heaney clamp. A free tie and suture placed on both pedicles. The ovaries were easily visualized. The left tube was grasped with Babcock clamp and a Kelly clamp placed behind this. Secured with suture ligature. The right tube was similarly grasped with a Babcock clamp and a Kelly clamp used to clamp behind this. A free tie and suture used for hemostasis. Inspection of all pedicles revealed adequate hemostasis. There was some bleeding noted at the vaginal cuff on the right side. The vagina was closed with 0 Vicryl suture in a locked running fashion with care taken to incorporate the uterosacral pedicles. Excellent hemostasis was noted at the end of the case. The vaginal cuff was inspected there was minimal bleeding noted.  A Foley catheter is placed inside her bladder. Clear, yellow urine was noted. All instrument needle and lap counts were correct x 2. Patient was awakened taken to recovery room in stable condition.  Reva Bores, MD 08/14/2018, 1:51 PM

## 2018-08-14 NOTE — Anesthesia Procedure Notes (Signed)
Procedure Name: LMA Insertion Date/Time: 08/14/2018 12:57 PM Performed by: Earmon Phoenix, CRNA Pre-anesthesia Checklist: Patient identified, Emergency Drugs available, Suction available, Patient being monitored and Timeout performed Patient Re-evaluated:Patient Re-evaluated prior to induction Oxygen Delivery Method: Circle system utilized Preoxygenation: Pre-oxygenation with 100% oxygen Induction Type: IV induction Ventilation: Mask ventilation without difficulty LMA: LMA inserted LMA Size: 4.0 Number of attempts: 1 Placement Confirmation: positive ETCO2,  CO2 detector and breath sounds checked- equal and bilateral Tube secured with: Tape Dental Injury: Teeth and Oropharynx as per pre-operative assessment

## 2018-08-14 NOTE — Anesthesia Postprocedure Evaluation (Signed)
Anesthesia Post Note  Patient: [name] Sanchez  Procedure(s) Performed: HYSTERECTOMY VAGINAL WITH SALPINGECTOMY (Bilateral Vagina )     Patient location during evaluation: PACU Anesthesia Type: General Level of consciousness: awake and sedated Pain management: pain level controlled Vital Signs Assessment: post-procedure vital signs reviewed and stable Respiratory status: spontaneous breathing Cardiovascular status: stable Postop Assessment: no apparent nausea or vomiting Anesthetic complications: no    Last Vitals:  Vitals:   08/14/18 1445 08/14/18 1500  BP: 114/77 112/65  Pulse: 94 80  Resp: 15 12  Temp:    SpO2: 100% 96%    Last Pain:  Vitals:   08/14/18 1500  TempSrc:   PainSc: 5    Pain Goal: Patients Stated Pain Goal: 4 (08/14/18 1142)               Lynnleigh Soden JR,JOHN Susann Givens

## 2018-08-14 NOTE — Interval H&P Note (Signed)
History and Physical Interval Note:  08/14/2018 12:41 PM  [name] Sanchez  has presented today for surgery, with the diagnosis of Pelvic Pain  The various methods of treatment have been discussed with the patient and family. After consideration of risks, benefits and other options for treatment, the patient has consented to  Procedure(s): HYSTERECTOMY VAGINAL WITH SALPINGECTOMY (Bilateral) as a surgical intervention .  The patient's history has been reviewed, patient examined, no change in status, stable for surgery.  I have reviewed the patient's chart and labs.  Questions were answered to the patient's satisfaction.     Reva Bores

## 2018-08-14 NOTE — Plan of Care (Signed)
Pts. Condition will continue to improve 

## 2018-08-15 ENCOUNTER — Encounter (HOSPITAL_COMMUNITY): Payer: Self-pay | Admitting: Family Medicine

## 2018-08-15 DIAGNOSIS — R102 Pelvic and perineal pain: Secondary | ICD-10-CM | POA: Diagnosis not present

## 2018-08-15 LAB — CBC
HCT: 33.2 % — ABNORMAL LOW (ref 36.0–46.0)
HEMOGLOBIN: 11.1 g/dL — AB (ref 12.0–15.0)
MCH: 31.1 pg (ref 26.0–34.0)
MCHC: 33.4 g/dL (ref 30.0–36.0)
MCV: 93 fL (ref 78.0–100.0)
Platelets: 251 10*3/uL (ref 150–400)
RBC: 3.57 MIL/uL — ABNORMAL LOW (ref 3.87–5.11)
RDW: 12.7 % (ref 11.5–15.5)
WBC: 11.3 10*3/uL — ABNORMAL HIGH (ref 4.0–10.5)

## 2018-08-15 MED ORDER — OXYCODONE-ACETAMINOPHEN 5-325 MG PO TABS: 1.0000 | ORAL_TABLET | Freq: Four times a day (QID) | ORAL | 0 refills | Status: DC | PRN

## 2018-08-15 MED ORDER — ESTRADIOL 0.1 MG/GM VA CREA
TOPICAL_CREAM | VAGINAL | Status: DC | PRN
  Administered 2018-08-14: 1 via VAGINAL

## 2018-08-15 MED ORDER — IBUPROFEN 600 MG PO TABS: 600.0000 mg | ORAL_TABLET | Freq: Four times a day (QID) | ORAL | 1 refills | Status: DC | PRN

## 2018-08-15 NOTE — Anesthesia Postprocedure Evaluation (Signed)
Anesthesia Post Note  Patient: [name] Sanchez  Procedure(s) Performed: HYSTERECTOMY VAGINAL WITH SALPINGECTOMY (Bilateral Vagina )     Patient location during evaluation: [facility] Anesthesia Type: General Level of consciousness: awake and alert Pain management: pain level controlled Vital Signs Assessment: post-procedure vital signs reviewed and stable Respiratory status: spontaneous breathing Cardiovascular status: blood pressure returned to baseline Postop Assessment: able to ambulate, adequate PO intake and no apparent nausea or vomiting Anesthetic complications: no    Last Vitals:  Vitals:   08/14/18 2358 08/15/18 0343  BP: (!) 115/59 (!) 98/54  Pulse: 60 (!) 58  Resp: 16 18  Temp: 36.8 C 36.7 C  SpO2: 98% 99%    Last Pain:  Vitals:   08/15/18 0430  TempSrc:   PainSc: 4    Pain Goal: Patients Stated Pain Goal: 4 (08/14/18 2116)               Chino Sardo

## 2018-08-15 NOTE — Addendum Note (Signed)
Addendum  created 08/15/18 0738 by Jhonnie Garner, CRNA   Sign clinical note

## 2018-08-15 NOTE — Discharge Summary (Signed)
Physician Discharge Summary  Patient ID: [name] Sanchez MRN: [id number] DOB/AGE: 27-Jun-1996 22 y.o.  Admit date: 08/14/2018 Discharge date:   Admission Diagnoses:  Principal Problem:   Pelvic pain Active Problems:   Status post hysterectomy   Discharge Diagnoses:  Same  Past Medical History:  Diagnosis Date  . Anxiety   . Complication of anesthesia    wakes up crying  . Depression   . Endometriosis   . Family history of adverse reaction to anesthesia    hard to wake up, low heart rate  . Gestational hypertension 01/2012   also had postpartum preelampsia  . History of stomach ulcers   . Hx of ovarian cyst    x 2-3  . Hx of varicella   . IBS (irritable bowel syndrome)   . Migraine    chronic  . Panic attack   . Pinched nerve    left arm  . Plica of knee 08/2012   medial plica left knee    Surgeries: Procedure(s): HYSTERECTOMY VAGINAL WITH SALPINGECTOMY on 08/14/2018   Consultants:   Discharged Condition: Improved  Hospital Course: [name] Sanchez is an 22 y.o. female [id number] who was admitted 08/14/2018 with a chief complaint of pelvic pain, and found to have a diagnosis of Pelvic pain.  They were brought to the operating room on 08/14/2018 and underwent the above named procedures.    They were given perioperative antibiotics:  Anti-infectives (From admission, onward)   Start     Dose/Rate Route Frequency Ordered Stop   08/14/18 0600  gentamicin (GARAMYCIN) 350 mg, clindamycin (CLEOCIN) 900 mg in dextrose 5 % 100 mL IVPB     229.5 mL/hr over 30 Minutes Intravenous On call to O.R. 08/14/18 0221 08/14/18 1304    .  They were given sequential compression devices, early ambulation, and chemoprophylaxis for DVT prophylaxis. She had her packing removed, foley out, was eating ambulating, tolerating po and was stable for discharge.  They benefited maximally from their hospital stay and there were no complications.    Recent vital signs:  Vitals:   08/15/18 0343  08/15/18 0748  BP: (!) 98/54 96/61  Pulse: (!) 58 (!) 59  Resp: 18 17  Temp: 98 F (36.7 C) 97.9 F (36.6 C)  SpO2: 99% 100%    Recent laboratory studies:  Results for orders placed or performed during the hospital encounter of 08/14/18  Pregnancy, urine  Result Value Ref Range   Preg Test, Ur NEGATIVE NEGATIVE  CBC  Result Value Ref Range   WBC 11.3 (H) 4.0 - 10.5 K/uL   RBC 3.57 (L) 3.87 - 5.11 MIL/uL   Hemoglobin 11.1 (L) 12.0 - 15.0 g/dL   HCT 95.6 (L) 21.3 - 08.6 %   MCV 93.0 78.0 - 100.0 fL   MCH 31.1 26.0 - 34.0 pg   MCHC 33.4 30.0 - 36.0 g/dL   RDW 57.8 46.9 - 62.9 %   Platelets 251 150 - 400 K/uL    Discharge Medications:   Allergies as of 08/15/2018      Reactions   Percocet [oxycodone-acetaminophen] Hives, Itching   Cefuroxime Axetil Rash   Penicillins Rash      Medication List    TAKE these medications   acetaminophen 650 MG CR tablet Commonly known as:  TYLENOL Take 1 tablet (650 mg total) by mouth every 8 (eight) hours as needed. What changed:  reasons to take this   ALPRAZolam 0.5 MG tablet Commonly known as:  XANAX Take 1 tablet (  0.5 mg total) by mouth daily as needed for anxiety.   Erenumab-aooe 140 MG/ML Soaj Inject 140 mg into the skin every 30 (thirty) days.   FLUoxetine 40 MG capsule Commonly known as:  PROZAC Take 1 capsule (40 mg total) by mouth daily.   ibuprofen 600 MG tablet Commonly known as:  ADVIL,MOTRIN Take 1 tablet (600 mg total) by mouth every 6 (six) hours as needed. What changed:  reasons to take this   ondansetron 4 MG tablet Commonly known as:  ZOFRAN Take 4 mg by mouth every 8 (eight) hours as needed for nausea or vomiting.   oxyCODONE-acetaminophen 5-325 MG tablet Commonly known as:  PERCOCET/ROXICET Take 1-2 tablets by mouth every 6 (six) hours as needed.   PROAIR HFA 108 (90 Base) MCG/ACT inhaler Generic drug:  albuterol Inhale 1-2 puffs into the lungs every 4 (four) hours as needed for wheezing or shortness  of breath.   promethazine 25 MG tablet Commonly known as:  PHENERGAN Take 25 mg by mouth every 6 (six) hours as needed for nausea or vomiting.   propranolol ER 80 MG 24 hr capsule Commonly known as:  INDERAL LA Take 1 capsule (80 mg total) by mouth daily.   tiZANidine 4 MG tablet Commonly known as:  ZANAFLEX Take 1 tablet (4 mg total) by mouth every 6 (six) hours as needed for muscle spasms. Or headaches.       Diagnostic Studies: Ct Head Wo Contrast  Result Date: 08/03/2018 CLINICAL DATA:  22 year old female with migraine headache.  Trauma. EXAM: CT HEAD WITHOUT CONTRAST TECHNIQUE: Contiguous axial images were obtained from the base of the skull through the vertex without intravenous contrast. COMPARISON:  Brain MRI dated 05/12/2017 FINDINGS: Evaluation of this exam is limited due to motion artifact. Brain: The ventricles and sulci appropriate size for patient's age. The gray-white matter discrimination is preserved. There is no acute intracranial hemorrhage. The lower portion of the brain is not evaluated and not well visualized due to severe motion artifact. No mass effect or midline shift. Vascular: No hyperdense vessel or unexpected calcification. Skull: Normal. Negative for fracture or focal lesion. Sinuses/Orbits: No acute finding. Other: None IMPRESSION: Limited evaluation of the brain due to severe motion artifact and nondiagnostic study of the lower portion of the brain and base of the skull. No acute intracranial pathology identified in the upper portion of the brain. If there is high clinical concern for acute intracranial pathology, repeat CT is recommended. Electronically Signed   By: Elgie Collard M.D.   On: 08/03/2018 05:46    Disposition:   Discharge Instructions    Call MD for:  difficulty breathing, headache or visual disturbances   Complete by:  As directed    Call MD for:  hives   Complete by:  As directed    Call MD for:  persistant nausea and vomiting   Complete  by:  As directed    Call MD for:  redness, tenderness, or signs of infection (pain, swelling, redness, odor or green/yellow discharge around incision site)   Complete by:  As directed    Call MD for:  severe uncontrolled pain   Complete by:  As directed    Call MD for:  temperature >100.4   Complete by:  As directed    Diet - low sodium heart healthy   Complete by:  As directed    Increase activity slowly   Complete by:  As directed       Follow-up Information  [facility] [employer] [facility] In 2 weeks.   Specialty:  Obstetrics and Gynecology Contact information: [address] [place] [address] [phone]           Signed: Reva Bores 08/15/2018, 9:30 AM

## 2018-08-15 NOTE — Progress Notes (Signed)
Pt   Out  In wheelchair   Teaching complete

## 2018-08-18 ENCOUNTER — Encounter (HOSPITAL_COMMUNITY): Payer: Self-pay | Admitting: *Deleted

## 2018-08-18 ENCOUNTER — Inpatient Hospital Stay (HOSPITAL_COMMUNITY)
Admission: AD | Admit: 2018-08-18 | Discharge: 2018-08-18 | Disposition: A | Discharge status: Home / Self Care | Payer: 59 | Source: Ambulatory Visit | Attending: Obstetrics and Gynecology | Admitting: Obstetrics and Gynecology

## 2018-08-18 DIAGNOSIS — G8918 Other acute postprocedural pain: Secondary | ICD-10-CM | POA: Insufficient documentation

## 2018-08-18 DIAGNOSIS — R102 Pelvic and perineal pain: Secondary | ICD-10-CM | POA: Diagnosis not present

## 2018-08-18 DIAGNOSIS — G8929 Other chronic pain: Secondary | ICD-10-CM | POA: Insufficient documentation

## 2018-08-18 DIAGNOSIS — Z9889 Other specified postprocedural states: Secondary | ICD-10-CM | POA: Insufficient documentation

## 2018-08-18 DIAGNOSIS — Z90710 Acquired absence of both cervix and uterus: Secondary | ICD-10-CM | POA: Diagnosis not present

## 2018-08-18 LAB — URINALYSIS, ROUTINE W REFLEX MICROSCOPIC
BILIRUBIN URINE: NEGATIVE
Bacteria, UA: NONE SEEN
GLUCOSE, UA: NEGATIVE mg/dL
Ketones, ur: NEGATIVE mg/dL
LEUKOCYTES UA: NEGATIVE
NITRITE: NEGATIVE
PH: 7 (ref 5.0–8.0)
PROTEIN: NEGATIVE mg/dL
Specific Gravity, Urine: 1.009 (ref 1.005–1.030)

## 2018-08-18 LAB — COMPREHENSIVE METABOLIC PANEL
ALT: 19 U/L (ref 0–44)
AST: 18 U/L (ref 15–41)
Albumin: 3.8 g/dL (ref 3.5–5.0)
Alkaline Phosphatase: 55 U/L (ref 38–126)
Anion gap: 8 (ref 5–15)
BUN: 13 mg/dL (ref 6–20)
CO2: 28 mmol/L (ref 22–32)
CREATININE: 0.67 mg/dL (ref 0.44–1.00)
Calcium: 8.9 mg/dL (ref 8.9–10.3)
Chloride: 101 mmol/L (ref 98–111)
GFR calc non Af Amer: >60 mL/min (ref >60)
GLUCOSE: 110 mg/dL — AB (ref 70–99)
Potassium: 4.1 mmol/L (ref 3.5–5.1)
SODIUM: 137 mmol/L (ref 135–145)
Total Bilirubin: 0.5 mg/dL (ref 0.3–1.2)
Total Protein: 7 g/dL (ref 6.5–8.1)

## 2018-08-18 LAB — CBC WITH DIFFERENTIAL/PLATELET
BASOS ABS: 0 10*3/uL (ref 0.0–0.1)
Basophils Relative: 0 %
Eosinophils Absolute: 0.2 10*3/uL (ref 0.0–0.7)
Eosinophils Relative: 2 %
HEMATOCRIT: 40.4 % (ref 36.0–46.0)
HEMOGLOBIN: 13.4 g/dL (ref 12.0–15.0)
LYMPHS ABS: 2.3 10*3/uL (ref 0.7–4.0)
Lymphocytes Relative: 26 %
MCH: 30.8 pg (ref 26.0–34.0)
MCHC: 33.2 g/dL (ref 30.0–36.0)
MCV: 92.9 fL (ref 78.0–100.0)
Monocytes Absolute: 0.4 10*3/uL (ref 0.1–1.0)
Monocytes Relative: 4 %
NEUTROS ABS: 5.8 10*3/uL (ref 1.7–7.7)
NEUTROS PCT: 68 %
PLATELETS: 270 10*3/uL (ref 150–400)
RBC: 4.35 MIL/uL (ref 3.87–5.11)
RDW: 12.3 % (ref 11.5–15.5)
WBC: 8.7 10*3/uL (ref 4.0–10.5)

## 2018-08-18 MED ORDER — ONDANSETRON HCL 4 MG/2ML IJ SOLN
4.0000 mg | Freq: Once | INTRAMUSCULAR | Status: AC
  Administered 2018-08-18: 4 mg via INTRAVENOUS
  Filled 2018-08-18: qty 2

## 2018-08-18 MED ORDER — SODIUM CHLORIDE 0.9 % IV SOLN
INTRAVENOUS | Status: DC
  Administered 2018-08-18: 20:00:00 via INTRAVENOUS

## 2018-08-18 MED ORDER — HYDROMORPHONE HCL 2 MG PO TABS: 2.0000 mg | ORAL_TABLET | ORAL | 0 refills | Status: DC | PRN

## 2018-08-18 MED ORDER — PROMETHAZINE HCL 25 MG RE SUPP: 25.0000 mg | Freq: Four times a day (QID) | RECTAL | 0 refills | Status: DC | PRN

## 2018-08-18 MED ORDER — KETOROLAC TROMETHAMINE 30 MG/ML IJ SOLN
30.0000 mg | Freq: Once | INTRAMUSCULAR | Status: AC
  Administered 2018-08-18: 30 mg via INTRAVENOUS
  Filled 2018-08-18: qty 1

## 2018-08-18 MED ORDER — HYDROMORPHONE HCL 1 MG/ML IJ SOLN
1.0000 mg | Freq: Once | INTRAMUSCULAR | Status: AC
  Administered 2018-08-18: 1 mg via INTRAVENOUS
  Filled 2018-08-18: qty 1

## 2018-08-18 NOTE — MAU Provider Note (Addendum)
History     CSN: [id number]  Arrival date and time: 08/18/18 1900   First Provider Initiated Contact with Patient 08/18/18 1945      Chief Complaint  Patient presents with  . postoperative pain   HPI  [name] Sanchez is a 22 y.o. [phone] s/p TVH POD#4 due to chronic pelvic pain who presents to MAU today with complaint of abdominal pain. She states pain is in the LLQ. She rates pain at 8/10 now. She last took Ibuprofen at 1500 and Percocet at 1640. She denies vaginal bleeding, diarrhea, constipation or fever. She has had N/V today.   OB History    Gravida  3   Para  2   Term  2   Preterm      AB  1   Living  2     SAB      TAB  1   Ectopic      Multiple      Live Births  2           Past Medical History:  Diagnosis Date  . Anxiety   . Complication of anesthesia    wakes up crying  . Depression   . Endometriosis   . Family history of adverse reaction to anesthesia    hard to wake up, low heart rate  . Gestational hypertension 01/2012   also had postpartum preelampsia  . History of stomach ulcers   . Hx of ovarian cyst    x 2-3  . Hx of varicella   . IBS (irritable bowel syndrome)   . Migraine    chronic  . Panic attack   . Pinched nerve    left arm  . Plica of knee 08/2012   medial plica left knee    Past Surgical History:  Procedure Laterality Date  . KNEE ARTHROSCOPY  08/17/2012   Procedure: ARTHROSCOPY KNEE;  Surgeon: Harvie JuniorJohn L Graves, MD;  Location: [facility];  Service: Orthopedics;  Laterality: Left;  left knee scope with tight lateral retinacular release and plica removal  . TYMPANOSTOMY TUBE PLACEMENT Bilateral 1998  . VAGINAL HYSTERECTOMY Bilateral 08/14/2018   Procedure: HYSTERECTOMY VAGINAL WITH SALPINGECTOMY;  Surgeon: Reva BoresPratt, Tanya S, MD;  Location: [facility];  Service: Gynecology;  Laterality: Bilateral;    Family History  Problem Relation Age of Onset  . Hypertension Mother   . Heart disease Mother        MI  .  Anesthesia problems Mother        post-op N/V  . Depression Mother   . Anxiety disorder Mother   . Obesity Mother   . Congestive Heart Failure Mother   . Asthma Sister   . Bipolar disorder Sister   . Asthma Brother   . Diabetes Maternal Aunt   . Heart disease Father   . Bipolar disorder Father   . Hyperlipidemia Father   . Uterine cancer Maternal Grandmother   . Pancreatic cancer Maternal Grandmother   . Lung cancer Maternal Grandfather     Social History   Tobacco Use  . Smoking status: Never Smoker  . Smokeless tobacco: Never Used  Substance Use Topics  . Alcohol use: Not Currently    Alcohol/week: 0.0 standard drinks    Comment: occassionally  . Drug use: No    Allergies:  Allergies  Allergen Reactions  . Percocet [Oxycodone-Acetaminophen] Hives and Itching  . Cefuroxime Axetil Rash  . Penicillins Rash    Medications Prior to Admission  Medication Sig Dispense Refill Last Dose  . acetaminophen (TYLENOL 8 HOUR) 650 MG CR tablet Take 1 tablet (650 mg total) by mouth every 8 (eight) hours as needed. (Patient taking differently: Take 650 mg by mouth every 8 (eight) hours as needed for pain. ) 30 tablet 0 Past Month at Unknown time  . Erenumab-aooe (AIMOVIG) 140 MG/ML SOAJ Inject 140 mg into the skin every 30 (thirty) days. 1 pen 11 Past Week at Unknown time  . FLUoxetine (PROZAC) 40 MG capsule Take 1 capsule (40 mg total) by mouth daily. 90 capsule 3 08/17/2018 at Unknown time  . ibuprofen (ADVIL,MOTRIN) 600 MG tablet Take 1 tablet (600 mg total) by mouth every 6 (six) hours as needed. 30 tablet 1 08/18/2018 at Unknown time  . ondansetron (ZOFRAN) 4 MG tablet Take 4 mg by mouth every 8 (eight) hours as needed for nausea or vomiting.   0 Past Month at Unknown time  . oxyCODONE-acetaminophen (PERCOCET/ROXICET) 5-325 MG tablet Take 1-2 tablets by mouth every 6 (six) hours as needed. 30 tablet 0 08/18/2018 at Unknown time  . PROAIR HFA 108 (90 Base) MCG/ACT inhaler Inhale 1-2  puffs into the lungs every 4 (four) hours as needed for wheezing or shortness of breath.   0 Past Week at Unknown time  . promethazine (PHENERGAN) 25 MG tablet Take 25 mg by mouth every 6 (six) hours as needed for nausea or vomiting.   Past Month at Unknown time  . tiZANidine (ZANAFLEX) 4 MG tablet Take 1 tablet (4 mg total) by mouth every 6 (six) hours as needed for muscle spasms. Or headaches. 30 tablet 0 Past Week at Unknown time  . ALPRAZolam (XANAX) 0.5 MG tablet Take 1 tablet (0.5 mg total) by mouth daily as needed for anxiety. 20 tablet 0 More than a month at Unknown time  . propranolol ER (INDERAL LA) 80 MG 24 hr capsule Take 1 capsule (80 mg total) by mouth daily. 30 capsule 3     Review of Systems  Constitutional: Negative for fever.  Gastrointestinal: Positive for abdominal pain, nausea and vomiting. Negative for constipation and diarrhea.  Genitourinary: Negative for dysuria, frequency, urgency, vaginal bleeding and vaginal discharge.   Physical Exam   Blood pressure 115/73, pulse 96, temperature 98 F (36.7 C), resp. rate 18, height 5\' 4"  (1.626 m), weight 95.3 kg, last menstrual period 07/15/2018.  Physical Exam  Nursing note and vitals reviewed. Constitutional: She is oriented to person, place, and time. She appears well-developed and well-nourished. No distress.  HENT:  Head: Normocephalic and atraumatic.  Cardiovascular: Normal rate.  Respiratory: Effort normal.  GI: Soft. She exhibits no distension and no mass. There is tenderness (mild tenderness to palpation in the LLQ). There is no rebound and no guarding.  Neurological: She is alert and oriented to person, place, and time.  Skin: Skin is warm and dry. No erythema.  Psychiatric: She has a normal mood and affect.   Results for orders placed or performed during the hospital encounter of 08/18/18 (from the past 24 hour(s))  Urinalysis, Routine w reflex microscopic     Status: Abnormal   Collection Time: 08/18/18  7:54  PM  Result Value Ref Range   Color, Urine STRAW (A) YELLOW   APPearance CLEAR CLEAR   Specific Gravity, Urine 1.009 1.005 - 1.030   pH 7.0 5.0 - 8.0   Glucose, UA NEGATIVE NEGATIVE mg/dL   Hgb urine dipstick SMALL (A) NEGATIVE   Bilirubin Urine NEGATIVE NEGATIVE  Ketones, ur NEGATIVE NEGATIVE mg/dL   Protein, ur NEGATIVE NEGATIVE mg/dL   Nitrite NEGATIVE NEGATIVE   Leukocytes, UA NEGATIVE NEGATIVE   WBC, UA 0-5 0 - 5 WBC/hpf   Bacteria, UA NONE SEEN NONE SEEN   Squamous Epithelial / LPF 0-5 0 - 5   Mucus PRESENT   CBC with Differential/Platelet     Status: None   Collection Time: 08/18/18  8:10 PM  Result Value Ref Range   WBC 8.7 4.0 - 10.5 K/uL   RBC 4.35 3.87 - 5.11 MIL/uL   Hemoglobin 13.4 12.0 - 15.0 g/dL   HCT 16.1 09.6 - 04.5 %   MCV 92.9 78.0 - 100.0 fL   MCH 30.8 26.0 - 34.0 pg   MCHC 33.2 30.0 - 36.0 g/dL   RDW 40.9 81.1 - 91.4 %   Platelets 270 150 - 400 K/uL   Neutrophils Relative % 68 %   Neutro Abs 5.8 1.7 - 7.7 K/uL   Lymphocytes Relative 26 %   Lymphs Abs 2.3 0.7 - 4.0 K/uL   Monocytes Relative 4 %   Monocytes Absolute 0.4 0.1 - 1.0 K/uL   Eosinophils Relative 2 %   Eosinophils Absolute 0.2 0.0 - 0.7 K/uL   Basophils Relative 0 %   Basophils Absolute 0.0 0.0 - 0.1 K/uL  Comprehensive metabolic panel     Status: Abnormal   Collection Time: 08/18/18  8:32 PM  Result Value Ref Range   Sodium 137 135 - 145 mmol/L   Potassium 4.1 3.5 - 5.1 mmol/L   Chloride 101 98 - 111 mmol/L   CO2 28 22 - 32 mmol/L   Glucose, Bld 110 (H) 70 - 99 mg/dL   BUN 13 6 - 20 mg/dL   Creatinine, Ser 7.82 0.44 - 1.00 mg/dL   Calcium 8.9 8.9 - 95.6 mg/dL   Total Protein 7.0 6.5 - 8.1 g/dL   Albumin 3.8 3.5 - 5.0 g/dL   AST 18 15 - 41 U/L   ALT 19 0 - 44 U/L   Alkaline Phosphatase 55 38 - 126 U/L   Total Bilirubin 0.5 0.3 - 1.2 mg/dL   GFR calc non Af Amer >60 >60 mL/min   GFR calc Af Amer >60 >60 mL/min   Anion gap 8 5 - 15    MAU Course  Procedures None  MDM CBC,  UA today  IV NS ordered. 1 mg Dilaudid and 4 mg Zofran given  Patient reports some improvement in pain Discussed patient with Dr. Emelda Fear. Recommends 30 mg IV Toradol here and changing home regimen to Dilaudid PO and continued Ibuprofen as well as adding Phenergan PO and PR PRN. Follow-up in the office this week as long as CMP does not show evidence of kidney dysfunction.  2037 - Labs pending. Care turned over to Venia Carbon, NP Labs WNL  Vonzella Nipple, PA-C 08/18/2018, 8:37 PM  Assessment and Plan   A:  1. Post-op pain   2. Chronic pelvic pain in female     P:  Discharge home in stable condition Message sent to the [facility] for f/u Rx: Phenergan, Dilaudid Return to MAU if symptoms worsen  Rasch, Harolyn Rutherford, NP 08/18/2018 10:33 PM

## 2018-08-18 NOTE — Discharge Instructions (Signed)
Vaginal Hysterectomy, Care After °Refer to this sheet in the next few weeks. These instructions provide you with information about caring for yourself after your procedure. Your health care provider may also give you more specific instructions. Your treatment has been planned according to current medical practices, but problems sometimes occur. Call your health care provider if you have any problems or questions after your procedure. °What can I expect after the procedure? °After the procedure, it is common to have: °· Pain. °· Soreness and numbness in your incision areas. °· Vaginal bleeding and discharge. °· Constipation. °· Temporary problems emptying the bladder. °· Feelings of sadness or other emotions. ° °Follow these instructions at home: °Medicines °· Take over-the-counter and prescription medicines only as told by your health care provider. °· If you were prescribed an antibiotic medicine, take it as told by your health care provider. Do not stop taking the antibiotic even if you start to feel better. °· Do not drive or operate heavy machinery while taking prescription pain medicine. °Activity °· Return to your normal activities as told by your health care provider. Ask your health care provider what activities are safe for you. °· Get regular exercise as told by your health care provider. You may be told to take short walks every day and go farther each time. °· Do not lift anything that is heavier than 10 lb (4.5 kg). °General instructions ° °· Do not put anything in your vagina for 6 weeks after your surgery or as told by your health care provider. This includes tampons and douches. °· Do not have sex until your health care provider says you can. °· Do not take baths, swim, or use a hot tub until your health care provider approves. °· Drink enough fluid to keep your urine clear or pale yellow. °· Do not drive for 24 hours if you were given a sedative. °· Keep all follow-up visits as told by your health  care provider. This is important. °Contact a health care provider if: °· Your pain medicine is not helping. °· You have a fever. °· You have redness, swelling, or pain at your incision site. °· You have blood, pus, or a bad-smelling discharge from your vagina. °· You continue to have difficulty urinating. °Get help right away if: °· You have severe abdominal or back pain. °· You have heavy bleeding from your vagina. °· You have chest pain or shortness of breath. °This information is not intended to replace advice given to you by your health care provider. Make sure you discuss any questions you have with your health care provider. °Document Released: 03/14/2016 Document Revised: 04/28/2016 Document Reviewed: 12/06/2015 °Elsevier Interactive Patient Education © 2018 Elsevier Inc. ° °

## 2018-08-18 NOTE — MAU Note (Signed)
Had vag hyst this past Tues. Yesterday started having vaginal pressure, pain LLQ, nausea/vomiting, dizziness. NO vag bleeding

## 2018-08-28 ENCOUNTER — Ambulatory Visit (INDEPENDENT_AMBULATORY_CARE_PROVIDER_SITE_OTHER): Payer: 59 | Admitting: Family Medicine

## 2018-08-28 ENCOUNTER — Encounter: Payer: Self-pay | Admitting: Family Medicine

## 2018-08-28 VITALS — BP 133/84 | HR 88 | Ht 64.0 in | Wt 209.0 lb

## 2018-08-28 DIAGNOSIS — Z09 Encounter for follow-up examination after completed treatment for conditions other than malignant neoplasm: Secondary | ICD-10-CM

## 2018-08-28 NOTE — Progress Notes (Signed)
    Subjective:    Patient ID: [name] Sanchez is a 22 y.o. female presenting with Routine Post Op  on 08/28/2018  HPI: Patient is now 2 wks s/p TVH. She is feeling well. Has had some extra fatigue when she overdoes it. Moving bowels and urine ok. Some bladder pain.  Review of Systems  Constitutional: Negative for chills and fever.  Respiratory: Negative for shortness of breath.   Cardiovascular: Negative for chest pain.  Gastrointestinal: Negative for abdominal pain, nausea and vomiting.  Genitourinary: Negative for dysuria.  Skin: Negative for rash.      Objective:    BP 133/84 (BP Location: Left Arm, Patient Position: Sitting)   Pulse 88   Ht 5\' 4"  (1.626 m)   Wt 209 lb (94.8 kg)   LMP 07/15/2018 (Exact Date)   BMI 35.87 kg/m  Physical Exam  Constitutional: She is oriented to person, place, and time. She appears well-developed and well-nourished. No distress.  HENT:  Head: Normocephalic and atraumatic.  Eyes: No scleral icterus.  Neck: Neck supple.  Cardiovascular: Normal rate.  Pulmonary/Chest: Effort normal.  Abdominal: Soft.  Neurological: She is alert and oriented to person, place, and time.  Skin: Skin is warm and dry.  Psychiatric: She has a normal mood and affect.        Assessment & Plan:  Postop check - Doing well  Return in about 4 weeks (around 09/25/2018).  [name] Sanchez 08/28/2018 3:37 PM

## 2018-08-28 NOTE — Progress Notes (Signed)
Pt states having pain on left side, more so at night.

## 2018-09-06 MED FILL — AIMOVIG 140 MG/ML SOAJ: 140 | 30 days supply | Qty: 1 | Fill #1

## 2018-09-10 ENCOUNTER — Ambulatory Visit: Payer: Self-pay | Admitting: Neurology

## 2018-09-12 ENCOUNTER — Ambulatory Visit: Payer: 59 | Admitting: Adult Health

## 2018-09-12 NOTE — Progress Notes (Deleted)
Subjective:    Patient ID: [name] Sanchez, female    DOB: May 28, 1996, 22 y.o.   MRN: [id number]  HPI: [name] Sanchez presents with  Of Note- She underwent Vaginal Hysterectomy w/ Salpingectomy 08/14/18, had her first post-up f/u 08/28/18, next f/u on 09/25/18   Patient Care Team    Relationship Specialty Notifications Start End  William Hamburger D, NP PCP - General Family Medicine  07/17/18   Reva Bores, MD Consulting Physician Obstetrics and Gynecology  07/17/18     Patient Active Problem List   Diagnosis Date Noted  . Status post hysterectomy 08/14/2018  . Acute post-traumatic headache, not intractable 07/30/2018  . Pelvic pain 07/31/2017  . Chronic migraine without aura without status migrainosus, not intractable 05/06/2016  . Anxiety and depression 04/04/2012  . Healthcare maintenance 04/01/2012  . Migraine with aura      Past Medical History:  Diagnosis Date  . Anxiety   . Complication of anesthesia    wakes up crying  . Depression   . Endometriosis   . Family history of adverse reaction to anesthesia    hard to wake up, low heart rate  . Gestational hypertension 01/2012   also had postpartum preelampsia  . History of stomach ulcers   . Hx of ovarian cyst    x 2-3  . Hx of varicella   . IBS (irritable bowel syndrome)   . Migraine    chronic  . Panic attack   . Pinched nerve    left arm  . Plica of knee 08/2012   medial plica left knee     Past Surgical History:  Procedure Laterality Date  . KNEE ARTHROSCOPY  08/17/2012   Procedure: ARTHROSCOPY KNEE;  Surgeon: Harvie Junior, MD;  [facility];  Service: Orthopedics;  Laterality: Left;  left knee scope with tight lateral retinacular release and plica removal  . TYMPANOSTOMY TUBE PLACEMENT Bilateral 1998  . VAGINAL HYSTERECTOMY Bilateral 08/14/2018   Procedure: HYSTERECTOMY VAGINAL WITH SALPINGECTOMY;  Surgeon: Reva Bores, MD;  Location: [facility];  Service: Gynecology;  Laterality:  Bilateral;     Family History  Problem Relation Age of Onset  . Hypertension Mother   . Heart disease Mother        MI  . Anesthesia problems Mother        post-op N/V  . Depression Mother   . Anxiety disorder Mother   . Obesity Mother   . Congestive Heart Failure Mother   . Asthma Sister   . Bipolar disorder Sister   . Asthma Brother   . Diabetes Maternal Aunt   . Heart disease Father   . Bipolar disorder Father   . Hyperlipidemia Father   . Uterine cancer Maternal Grandmother   . Pancreatic cancer Maternal Grandmother   . Lung cancer Maternal Grandfather      Social History   Substance and Sexual Activity  Drug Use No     Social History   Substance and Sexual Activity  Alcohol Use Not Currently  . Alcohol/week: 0.0 standard drinks   Comment: occassionally     Social History   Tobacco Use  Smoking Status Never Smoker  Smokeless Tobacco Never Used     Outpatient Encounter Medications as of 09/12/2018  Medication Sig Note  . acetaminophen (TYLENOL 8 HOUR) 650 MG CR tablet Take 1 tablet (650 mg total) by mouth every 8 (eight) hours as needed. (Patient taking differently: Take 650 mg by mouth every  8 (eight) hours as needed for pain. )   . ALPRAZolam (XANAX) 0.5 MG tablet Take 1 tablet (0.5 mg total) by mouth daily as needed for anxiety.   Dorise Hiss (AIMOVIG) 140 MG/ML SOAJ Inject 140 mg into the skin every 30 (thirty) days.   Marland Kitchen FLUoxetine (PROZAC) 40 MG capsule Take 1 capsule (40 mg total) by mouth daily.   Marland Kitchen HYDROmorphone (DILAUDID) 2 MG tablet Take 1 tablet (2 mg total) by mouth every 4 (four) hours as needed for severe pain. (Patient not taking: Reported on 08/28/2018)   . ibuprofen (ADVIL,MOTRIN) 600 MG tablet Take 1 tablet (600 mg total) by mouth every 6 (six) hours as needed.   . ondansetron (ZOFRAN) 4 MG tablet Take 4 mg by mouth every 8 (eight) hours as needed for nausea or vomiting.    Marland Kitchen oxyCODONE-acetaminophen (PERCOCET/ROXICET) 5-325 MG tablet  Take 1-2 tablets by mouth every 6 (six) hours as needed.   Marland Kitchen PROAIR HFA 108 (90 Base) MCG/ACT inhaler Inhale 1-2 puffs into the lungs every 4 (four) hours as needed for wheezing or shortness of breath.  08/08/2018: Only took when she had bronchitis  . promethazine (PHENERGAN) 25 MG suppository Place 1 suppository (25 mg total) rectally every 6 (six) hours as needed for nausea or vomiting. (Patient not taking: Reported on 08/28/2018)   . promethazine (PHENERGAN) 25 MG tablet Take 25 mg by mouth every 6 (six) hours as needed for nausea or vomiting.   . propranolol ER (INDERAL LA) 80 MG 24 hr capsule Take 1 capsule (80 mg total) by mouth daily. (Patient not taking: Reported on 08/28/2018) 08/14/2018: Not started  . tiZANidine (ZANAFLEX) 4 MG tablet Take 1 tablet (4 mg total) by mouth every 6 (six) hours as needed for muscle spasms. Or headaches.    No facility-administered encounter medications on file as of 09/12/2018.     Allergies: Percocet [oxycodone-acetaminophen]; Cefuroxime axetil; and Penicillins  There is no height or weight on file to calculate BMI.  Last menstrual period 07/15/2018.   Review of Systems     Objective:   Physical Exam        Assessment & Plan:  No diagnosis found.  No problem-specific Assessment & Plan notes found for this encounter.    FOLLOW-UP:  No follow-ups on file.

## 2018-09-18 NOTE — Progress Notes (Signed)
Subjective:    Patient ID: [name] Sanchez, female    DOB: 1996-04-02, 22 y.o.   MRN: [id number]  HPI:  [name] is here for CPE She underwent hysterectomy 08/15/18- she has had 2 week f/u and will have final follow-up Oct 29,2019. She cannot lift >10 lbs and is off of work on short term disability. Her sister recently returned home and she reports the change of season and decrease of tome of sunlight has dramatically increased her depression. She reports sleeping more than usual, having little energy, and poor appetite the last 3 weeks. She denies suicidal thoughts/ideations. She denies ever being dx'd with Seasonal Affective Disorder (SAD) She is open to CBT referral She reports working on "adult [employer]" and spending time with her children 9 (ages 66,6) that provide her happiness. She reports that she and her husband are in "the happiest we have been in 10 years". She denies tobacco/ETOH use She reports urinary frequency the last 2 weeks, estimates to only be drinking 40 oz water/day  Healthcare Maintenance: PAP-N/A Mammogram-N/A Immunizations-UTD  Patient Care Team    Relationship Specialty Notifications Start End  William Hamburger D, NP PCP - General Family Medicine  07/17/18   Reva Bores, MD Consulting Physician Obstetrics and Gynecology  07/17/18     Patient Active Problem List   Diagnosis Date Noted  . Seasonal affective disorder (HCC) 09/19/2018  . Urinary frequency 09/19/2018  . Status post hysterectomy 08/14/2018  . Acute post-traumatic headache, not intractable 07/30/2018  . Pelvic pain 07/31/2017  . Chronic migraine without aura without status migrainosus, not intractable 05/06/2016  . Depression with anxiety 04/04/2012  . Healthcare maintenance 04/01/2012  . Migraine with aura      Past Medical History:  Diagnosis Date  . Anxiety   . Complication of anesthesia    wakes up crying  . Depression   . Endometriosis   . Family history of adverse  reaction to anesthesia    hard to wake up, low heart rate  . Gestational hypertension 01/2012   also had postpartum preelampsia  . History of stomach ulcers   . Hx of ovarian cyst    x 2-3  . Hx of varicella   . IBS (irritable bowel syndrome)   . Migraine    chronic  . Panic attack   . Pinched nerve    left arm  . Plica of knee 08/2012   medial plica left knee     Past Surgical History:  Procedure Laterality Date  . KNEE ARTHROSCOPY  08/17/2012   Procedure: ARTHROSCOPY KNEE;  Surgeon: Harvie Junior, MD;  Location: [facility];  Service: Orthopedics;  Laterality: Left;  left knee scope with tight lateral retinacular release and plica removal  . TYMPANOSTOMY TUBE PLACEMENT Bilateral 1998  . VAGINAL HYSTERECTOMY Bilateral 08/14/2018   Procedure: HYSTERECTOMY VAGINAL WITH SALPINGECTOMY;  Surgeon: Reva Bores, MD;  Location: [facility];  Service: Gynecology;  Laterality: Bilateral;     Family History  Problem Relation Age of Onset  . Hypertension Mother   . Heart disease Mother        MI  . Anesthesia problems Mother        post-op N/V  . Depression Mother   . Anxiety disorder Mother   . Obesity Mother   . Congestive Heart Failure Mother   . Asthma Sister   . Bipolar disorder Sister   . Asthma Brother   . Diabetes Maternal Aunt   .  Heart disease Father   . Bipolar disorder Father   . Hyperlipidemia Father   . Uterine cancer Maternal Grandmother   . Pancreatic cancer Maternal Grandmother   . Lung cancer Maternal Grandfather      Social History   Substance and Sexual Activity  Drug Use No     Social History   Substance and Sexual Activity  Alcohol Use Not Currently  . Alcohol/week: 0.0 standard drinks   Comment: occassionally     Social History   Tobacco Use  Smoking Status Never Smoker  Smokeless Tobacco Never Used     Outpatient Encounter Medications as of 09/19/2018  Medication Sig Note  . acetaminophen (TYLENOL 8 HOUR) 650 MG CR  tablet Take 1 tablet (650 mg total) by mouth every 8 (eight) hours as needed. (Patient taking differently: Take 650 mg by mouth every 8 (eight) hours as needed for pain. )   . ALPRAZolam (XANAX) 0.5 MG tablet Take 1 tablet (0.5 mg total) by mouth daily as needed for anxiety.   Dorise Hiss (AIMOVIG) 140 MG/ML SOAJ Inject 140 mg into the skin every 30 (thirty) days.   Marland Kitchen FLUoxetine (PROZAC) 40 MG capsule Take 1 capsule (40 mg total) by mouth daily.   Marland Kitchen HYDROmorphone (DILAUDID) 2 MG tablet Take 1 tablet (2 mg total) by mouth every 4 (four) hours as needed for severe pain.   Marland Kitchen ibuprofen (ADVIL,MOTRIN) 600 MG tablet Take 1 tablet (600 mg total) by mouth every 6 (six) hours as needed.   . ondansetron (ZOFRAN) 4 MG tablet Take 4 mg by mouth every 8 (eight) hours as needed for nausea or vomiting.    Marland Kitchen oxyCODONE-acetaminophen (PERCOCET/ROXICET) 5-325 MG tablet Take 1-2 tablets by mouth every 6 (six) hours as needed.   Marland Kitchen PROAIR HFA 108 (90 Base) MCG/ACT inhaler Inhale 1-2 puffs into the lungs every 4 (four) hours as needed for wheezing or shortness of breath.  08/08/2018: Only took when she had bronchitis  . promethazine (PHENERGAN) 25 MG suppository Place 1 suppository (25 mg total) rectally every 6 (six) hours as needed for nausea or vomiting.   . promethazine (PHENERGAN) 25 MG tablet Take 25 mg by mouth every 6 (six) hours as needed for nausea or vomiting.   . propranolol ER (INDERAL LA) 80 MG 24 hr capsule Take 1 capsule (80 mg total) by mouth daily. 08/14/2018: Not started  . tiZANidine (ZANAFLEX) 4 MG tablet Take 1 tablet (4 mg total) by mouth every 6 (six) hours as needed for muscle spasms. Or headaches.   . clotrimazole (LOTRIMIN) 1 % external solution Apply 1 application topically 2 (two) times daily.    No facility-administered encounter medications on file as of 09/19/2018.     Allergies: Percocet [oxycodone-acetaminophen]; Cefuroxime axetil; and Penicillins  Body mass index is 36.37  kg/m.  Blood pressure 100/66, pulse 90, height 5\' 4"  (1.626 m), weight 211 lb 14.4 oz (96.1 kg), last menstrual period 07/15/2018, SpO2 97 %.    Review of Systems  Constitutional: Positive for fatigue. Negative for activity change, appetite change, chills, diaphoresis, fever and unexpected weight change.  HENT: Negative for congestion.   Eyes: Negative for visual disturbance.  Respiratory: Negative for cough, chest tightness, shortness of breath, wheezing and stridor.   Cardiovascular: Negative for chest pain, palpitations and leg swelling.  Gastrointestinal: Negative for abdominal distention, abdominal pain, blood in stool, constipation, diarrhea, nausea and vomiting.  Endocrine: Negative for cold intolerance, heat intolerance, polydipsia, polyphagia and polyuria.  Genitourinary: Positive for frequency.  Negative for dysuria, flank pain and hematuria.  Musculoskeletal: Positive for back pain. Negative for arthralgias, gait problem, joint swelling, myalgias, neck pain and neck stiffness.  Skin: Negative for color change, pallor, rash and wound.  Neurological: Positive for headaches. Negative for dizziness.  Hematological: Does not bruise/bleed easily.  Psychiatric/Behavioral: Positive for dysphoric mood. Negative for behavioral problems, confusion, decreased concentration, hallucinations, self-injury, sleep disturbance and suicidal ideas. The patient is nervous/anxious. The patient is not hyperactive.        Objective:   Physical Exam  Constitutional: She is oriented to person, place, and time. She appears well-developed and well-nourished. No distress.  HENT:  Head: Normocephalic and atraumatic.  Right Ear: External ear normal. There is swelling. Tympanic membrane is not erythematous. No decreased hearing is noted.  Left Ear: External ear normal. Tympanic membrane is not erythematous and not bulging. No decreased hearing is noted.  Nose: Nose normal.  Mouth/Throat: Oropharynx is clear  and moist.  White fungus noted in R canal  Eyes: Pupils are equal, round, and reactive to light. Conjunctivae and EOM are normal.  Neck: Normal range of motion. Neck supple.  Cardiovascular: Normal rate, regular rhythm, normal heart sounds and intact distal pulses.  Pulmonary/Chest: Effort normal and breath sounds normal. No stridor. No respiratory distress. She has no wheezes. She has no rales. She exhibits no tenderness.  Abdominal: Soft. Bowel sounds are normal. She exhibits no distension and no mass. There is no tenderness. There is no rebound and no guarding. No hernia.  Musculoskeletal: Normal range of motion. She exhibits tenderness. She exhibits no edema.       Lumbar back: She exhibits tenderness.  Lymphadenopathy:    She has no cervical adenopathy.  Neurological: She is alert and oriented to person, place, and time. Coordination normal.  Skin: Skin is warm and dry. Capillary refill takes less than 2 seconds. No rash noted. She is not diaphoretic. No erythema. No pallor.  Psychiatric: She has a normal mood and affect. Her behavior is normal. Judgment and thought content normal.  Nursing note and vitals reviewed.     Assessment & Plan:   1. Need for influenza vaccination   2. Healthcare maintenance   3. Depression with anxiety   4. Seasonal affective disorder (HCC)   5. Urinary frequency   6. Migraine with aura and without status migrainosus, not intractable     Healthcare maintenance Continue all medications as directed. Please use Clotrimazole 1% solution- 1 application twice daily. Referral to behavioral health placed, re: increased depression since fall season started. Recommend taking 10-15 min stroll outside 1-2 times/day to help with mood. Urinalysis is normal. Increase water intake, strive for at least68 ounces/day.   Follow Mediterranean diet Recommend follow-up in 6 months, sooner if needed.  Depression with anxiety Denies suicidal thoughts  Currently on  Fluoxetine 40mg  Referral to Behavioral Health placed  Migraine with aura Followed by Neurology  Monthly Aimovig injections She did not start Propranolol 80mg  QD  Seasonal affective disorder (HCC) Referred to Behavioral Health   Urinary frequency UA neg    FOLLOW-UP:  Return in about 6 months (around 03/21/2019) for Regular Follow Up.

## 2018-09-19 ENCOUNTER — Telehealth: Payer: Self-pay | Admitting: Adult Health

## 2018-09-19 ENCOUNTER — Encounter: Payer: Self-pay | Admitting: Adult Health

## 2018-09-19 ENCOUNTER — Ambulatory Visit (INDEPENDENT_AMBULATORY_CARE_PROVIDER_SITE_OTHER): Payer: 59 | Admitting: Adult Health

## 2018-09-19 VITALS — BP 100/66 | HR 90 | Ht 64.0 in | Wt 211.9 lb

## 2018-09-19 DIAGNOSIS — F338 Other recurrent depressive disorders: Secondary | ICD-10-CM

## 2018-09-19 DIAGNOSIS — Z0000 Encounter for general adult medical examination without abnormal findings: Secondary | ICD-10-CM | POA: Diagnosis not present

## 2018-09-19 DIAGNOSIS — F418 Other specified anxiety disorders: Secondary | ICD-10-CM

## 2018-09-19 DIAGNOSIS — G43109 Migraine with aura, not intractable, without status migrainosus: Secondary | ICD-10-CM

## 2018-09-19 DIAGNOSIS — R350 Frequency of micturition: Secondary | ICD-10-CM

## 2018-09-19 DIAGNOSIS — Z23 Encounter for immunization: Secondary | ICD-10-CM | POA: Diagnosis not present

## 2018-09-19 LAB — POCT URINALYSIS DIPSTICK
Bilirubin, UA: NEGATIVE
GLUCOSE UA: NEGATIVE
Ketones, UA: NEGATIVE
LEUKOCYTES UA: NEGATIVE
NITRITE UA: NEGATIVE
Protein, UA: NEGATIVE
RBC UA: NEGATIVE
SPEC GRAV UA: 1.025 (ref 1.010–1.025)
Urobilinogen, UA: 0.2 E.U./dL
pH, UA: 5.5 (ref 5.0–8.0)

## 2018-09-19 MED ORDER — CLOTRIMAZOLE 1 % EX SOLN: 1.0000 "application " | Freq: Two times a day (BID) | CUTANEOUS | 0 refills | Status: DC

## 2018-09-19 NOTE — Patient Instructions (Addendum)
Preventive Care for Adults, Female  A healthy lifestyle and preventive care can promote health and wellness. Preventive health guidelines for women include the following key practices.   A routine yearly physical is a good way to check with your health care provider about your health and preventive screening. It is a chance to share any concerns and updates on your health and to receive a thorough exam.   Visit your dentist for a routine exam and preventive care every 6 months. Brush your teeth twice a day and floss once a day. Good oral hygiene prevents tooth decay and gum disease.   The frequency of eye exams is based on your age, health, family medical history, use of contact lenses, and other factors. Follow your health care provider's recommendations for frequency of eye exams.   Eat a healthy diet. Foods like vegetables, fruits, whole grains, low-fat dairy products, and lean protein foods contain the nutrients you need without too many calories. Decrease your intake of foods high in solid fats, added sugars, and salt. Eat the right amount of calories for you.Get information about a proper diet from your health care provider, if necessary.   Regular physical exercise is one of the most important things you can do for your health. Most adults should get at least 150 minutes of moderate-intensity exercise (any activity that increases your heart rate and causes you to sweat) each week. In addition, most adults need muscle-strengthening exercises on 2 or more days a week.   Maintain a healthy weight. The body mass index (BMI) is a screening tool to identify possible weight problems. It provides an estimate of body fat based on height and weight. Your health care provider can find your BMI, and can help you achieve or maintain a healthy weight.For adults 20 years and older:   - A BMI below 18.5 is considered underweight.   - A BMI of 18.5 to 24.9 is normal.   - A BMI of 25 to 29.9 is  considered overweight.   - A BMI of 30 and above is considered obese.   Maintain normal blood lipids and cholesterol levels by exercising and minimizing your intake of trans and saturated fats.  Eat a balanced diet with plenty of fruit and vegetables. Blood tests for lipids and cholesterol should begin at age 20 and be repeated every 5 years minimum.  If your lipid or cholesterol levels are high, you are over 40, or you are at high risk for heart disease, you may need your cholesterol levels checked more frequently.Ongoing high lipid and cholesterol levels should be treated with medicines if diet and exercise are not working.   If you smoke, find out from your health care provider how to quit. If you do not use tobacco, do not start.   Lung cancer screening is recommended for adults aged 55-80 years who are at high risk for developing lung cancer because of a history of smoking. A yearly low-dose CT scan of the lungs is recommended for people who have at least a 30-pack-year history of smoking and are a current smoker or have quit within the past 15 years. A pack year of smoking is smoking an average of 1 pack of cigarettes a day for 1 year (for example: 1 pack a day for 30 years or 2 packs a day for 15 years). Yearly screening should continue until the smoker has stopped smoking for at least 15 years. Yearly screening should be stopped for people who develop a   health problem that would prevent them from having lung cancer treatment.   If you are pregnant, do not drink alcohol. If you are breastfeeding, be very cautious about drinking alcohol. If you are not pregnant and choose to drink alcohol, do not have more than 1 drink per day. One drink is considered to be 12 ounces (355 mL) of beer, 5 ounces (148 mL) of wine, or 1.5 ounces (44 mL) of liquor.   Avoid use of street drugs. Do not share needles with anyone. Ask for help if you need support or instructions about stopping the use of  drugs.   High blood pressure causes heart disease and increases the risk of stroke. Your blood pressure should be checked at least yearly.  Ongoing high blood pressure should be treated with medicines if weight loss and exercise do not work.   If you are 69-55 years old, ask your health care provider if you should take aspirin to prevent strokes.   Diabetes screening involves taking a blood sample to check your fasting blood sugar level. This should be done once every 3 years, after age 38, if you are within normal weight and without risk factors for diabetes. Testing should be considered at a younger age or be carried out more frequently if you are overweight and have at least 1 risk factor for diabetes.   Breast cancer screening is essential preventive care for women. You should practice "breast self-awareness."  This means understanding the normal appearance and feel of your breasts and may include breast self-examination.  Any changes detected, no matter how small, should be reported to a health care provider.  Women in their 80s and 30s should have a clinical breast exam (CBE) by a health care provider as part of a regular health exam every 1 to 3 years.  After age 66, women should have a CBE every year.  Starting at age 1, women should consider having a mammogram (breast X-ray test) every year.  Women who have a family history of breast cancer should talk to their health care provider about genetic screening.  Women at a high risk of breast cancer should talk to their health care providers about having an MRI and a mammogram every year.   -Breast cancer gene (BRCA)-related cancer risk assessment is recommended for women who have family members with BRCA-related cancers. BRCA-related cancers include breast, ovarian, tubal, and peritoneal cancers. Having family members with these cancers may be associated with an increased risk for harmful changes (mutations) in the breast cancer genes BRCA1 and  BRCA2. Results of the assessment will determine the need for genetic counseling and BRCA1 and BRCA2 testing.   The Pap test is a screening test for cervical cancer. A Pap test can show cell changes on the cervix that might become cervical cancer if left untreated. A Pap test is a procedure in which cells are obtained and examined from the lower end of the uterus (cervix).   - Women should have a Pap test starting at age 57.   - Between ages 90 and 70, Pap tests should be repeated every 2 years.   - Beginning at age 63, you should have a Pap test every 3 years as long as the past 3 Pap tests have been normal.   - Some women have medical problems that increase the chance of getting cervical cancer. Talk to your health care provider about these problems. It is especially important to talk to your health care provider if a  new problem develops soon after your last Pap test. In these cases, your health care provider may recommend more frequent screening and Pap tests.   - The above recommendations are the same for women who have or have not gotten the vaccine for human papillomavirus (HPV).   - If you had a hysterectomy for a problem that was not cancer or a condition that could lead to cancer, then you no longer need Pap tests. Even if you no longer need a Pap test, a regular exam is a good idea to make sure no other problems are starting.   - If you are between ages 36 and 66 years, and you have had normal Pap tests going back 10 years, you no longer need Pap tests. Even if you no longer need a Pap test, a regular exam is a good idea to make sure no other problems are starting.   - If you have had past treatment for cervical cancer or a condition that could lead to cancer, you need Pap tests and screening for cancer for at least 20 years after your treatment.   - If Pap tests have been discontinued, risk factors (such as a new sexual partner) need to be reassessed to determine if screening should  be resumed.   - The HPV test is an additional test that may be used for cervical cancer screening. The HPV test looks for the virus that can cause the cell changes on the cervix. The cells collected during the Pap test can be tested for HPV. The HPV test could be used to screen women aged 70 years and older, and should be used in women of any age who have unclear Pap test results. After the age of 67, women should have HPV testing at the same frequency as a Pap test.   Colorectal cancer can be detected and often prevented. Most routine colorectal cancer screening begins at the age of 57 years and continues through age 26 years. However, your health care provider may recommend screening at an earlier age if you have risk factors for colon cancer. On a yearly basis, your health care provider may provide home test kits to check for hidden blood in the stool.  Use of a small camera at the end of a tube, to directly examine the colon (sigmoidoscopy or colonoscopy), can detect the earliest forms of colorectal cancer. Talk to your health care provider about this at age 23, when routine screening begins. Direct exam of the colon should be repeated every 5 -10 years through age 49 years, unless early forms of pre-cancerous polyps or small growths are found.   People who are at an increased risk for hepatitis B should be screened for this virus. You are considered at high risk for hepatitis B if:  -You were born in a country where hepatitis B occurs often. Talk with your health care provider about which countries are considered high risk.  - Your parents were born in a high-risk country and you have not received a shot to protect against hepatitis B (hepatitis B vaccine).  - You have HIV or AIDS.  - You use needles to inject street drugs.  - You live with, or have sex with, someone who has Hepatitis B.  - You get hemodialysis treatment.  - You take certain medicines for conditions like cancer, organ  transplantation, and autoimmune conditions.   Hepatitis C blood testing is recommended for all people born from [birth year] through 1965 and any individual  with known risks for hepatitis C.   Practice safe sex. Use condoms and avoid high-risk sexual practices to reduce the spread of sexually transmitted infections (STIs). STIs include gonorrhea, chlamydia, syphilis, trichomonas, herpes, HPV, and human immunodeficiency virus (HIV). Herpes, HIV, and HPV are viral illnesses that have no cure. They can result in disability, cancer, and death. Sexually active women aged 25 years and younger should be checked for chlamydia. Older women with new or multiple partners should also be tested for chlamydia. Testing for other STIs is recommended if you are sexually active and at increased risk.   Osteoporosis is a disease in which the bones lose minerals and strength with aging. This can result in serious bone fractures or breaks. The risk of osteoporosis can be identified using a bone density scan. Women ages 65 years and over and women at risk for fractures or osteoporosis should discuss screening with their health care providers. Ask your health care provider whether you should take a calcium supplement or vitamin D to There are also several preventive steps women can take to avoid osteoporosis and resulting fractures or to keep osteoporosis from worsening. -->Recommendations include:  Eat a balanced diet high in fruits, vegetables, calcium, and vitamins.  Get enough calcium. The recommended total intake of is 1,200 mg daily; for best absorption, if taking supplements, divide doses into 250-500 mg doses throughout the day. Of the two types of calcium, calcium carbonate is best absorbed when taken with food but calcium citrate can be taken on an empty stomach.  Get enough vitamin D. NAMS and the National Osteoporosis Foundation recommend at least 1,000 IU per day for women age 50 and over who are at risk of vitamin D  deficiency. Vitamin D deficiency can be caused by inadequate sun exposure (for example, those who live in northern latitudes).  Avoid alcohol and smoking. Heavy alcohol intake (more than 7 drinks per week) increases the risk of falls and hip fracture and women smokers tend to lose bone more rapidly and have lower bone mass than nonsmokers. Stopping smoking is one of the most important changes women can make to improve their health and decrease risk for disease.  Be physically active every day. Weight-bearing exercise (for example, fast walking, hiking, jogging, and weight training) may strengthen bones or slow the rate of bone loss that comes with aging. Balancing and muscle-strengthening exercises can reduce the risk of falling and fracture.  Consider therapeutic medications. Currently, several types of effective drugs are available. Healthcare providers can recommend the type most appropriate for each woman.  Eliminate environmental factors that may contribute to accidents. Falls cause nearly 90% of all osteoporotic fractures, so reducing this risk is an important bone-health strategy. Measures include ample lighting, removing obstructions to walking, using nonskid rugs on floors, and placing mats and/or grab bars in showers.  Be aware of medication side effects. Some common medicines make bones weaker. These include a type of steroid drug called glucocorticoids used for arthritis and asthma, some antiseizure drugs, certain sleeping pills, treatments for endometriosis, and some cancer drugs. An overactive thyroid gland or using too much thyroid hormone for an underactive thyroid can also be a problem. If you are taking these medicines, talk to your doctor about what you can do to help protect your bones.reduce the rate of osteoporosis.    Menopause can be associated with physical symptoms and risks. Hormone replacement therapy is available to decrease symptoms and risks. You should talk to your  health care provider   about whether hormone replacement therapy is right for you.   Use sunscreen. Apply sunscreen liberally and repeatedly throughout the day. You should seek shade when your shadow is shorter than you. Protect yourself by wearing long sleeves, pants, a wide-brimmed hat, and sunglasses year round, whenever you are outdoors.   Once a month, do a whole body skin exam, using a mirror to look at the skin on your back. Tell your health care provider of new moles, moles that have irregular borders, moles that are larger than a pencil eraser, or moles that have changed in shape or color.   -Stay current with required vaccines (immunizations).   Influenza vaccine. All adults should be immunized every year.  Tetanus, diphtheria, and acellular pertussis (Td, Tdap) vaccine. Pregnant women should receive 1 dose of Tdap vaccine during each pregnancy. The dose should be obtained regardless of the length of time since the last dose. Immunization is preferred during the 27th 36th week of gestation. An adult who has not previously received Tdap or who does not know her vaccine status should receive 1 dose of Tdap. This initial dose should be followed by tetanus and diphtheria toxoids (Td) booster doses every 10 years. Adults with an unknown or incomplete history of completing a 3-dose immunization series with Td-containing vaccines should begin or complete a primary immunization series including a Tdap dose. Adults should receive a Td booster every 10 years.  Varicella vaccine. An adult without evidence of immunity to varicella should receive 2 doses or a second dose if she has previously received 1 dose. Pregnant females who do not have evidence of immunity should receive the first dose after pregnancy. This first dose should be obtained before leaving the health care facility. The second dose should be obtained 4 8 weeks after the first dose.  Human papillomavirus (HPV) vaccine. Females aged 13 26  years who have not received the vaccine previously should obtain the 3-dose series. The vaccine is not recommended for use in pregnant females. However, pregnancy testing is not needed before receiving a dose. If a female is found to be pregnant after receiving a dose, no treatment is needed. In that case, the remaining doses should be delayed until after the pregnancy. Immunization is recommended for any person with an immunocompromised condition through the age of 26 years if she did not get any or all doses earlier. During the 3-dose series, the second dose should be obtained 4 8 weeks after the first dose. The third dose should be obtained 24 weeks after the first dose and 16 weeks after the second dose.  Zoster vaccine. One dose is recommended for adults aged 60 years or older unless certain conditions are present.  Measles, mumps, and rubella (MMR) vaccine. Adults born before 1957 generally are considered immune to measles and mumps. Adults born in 1957 or later should have 1 or more doses of MMR vaccine unless there is a contraindication to the vaccine or there is laboratory evidence of immunity to each of the three diseases. A routine second dose of MMR vaccine should be obtained at least 28 days after the first dose for students attending postsecondary schools, health care workers, or international travelers. People who received inactivated measles vaccine or an unknown type of measles vaccine during 1963 1967 should receive 2 doses of MMR vaccine. People who received inactivated mumps vaccine or an unknown type of mumps vaccine before 1979 and are at high risk for mumps infection should consider immunization with 2 doses of   MMR vaccine. For females of childbearing age, rubella immunity should be determined. If there is no evidence of immunity, females who are not pregnant should be vaccinated. If there is no evidence of immunity, females who are pregnant should delay immunization until after pregnancy.  Unvaccinated health care workers born before [birth year] who lack laboratory evidence of measles, mumps, or rubella immunity or laboratory confirmation of disease should consider measles and mumps immunization with 2 doses of MMR vaccine or rubella immunization with 1 dose of MMR vaccine.  Pneumococcal 13-valent conjugate (PCV13) vaccine. When indicated, a person who is uncertain of her immunization history and has no record of immunization should receive the PCV13 vaccine. An adult aged 54 years or older who has certain medical conditions and has not been previously immunized should receive 1 dose of PCV13 vaccine. This PCV13 should be followed with a dose of pneumococcal polysaccharide (PPSV23) vaccine. The PPSV23 vaccine dose should be obtained at least 8 weeks after the dose of PCV13 vaccine. An adult aged 58 years or older who has certain medical conditions and previously received 1 or more doses of PPSV23 vaccine should receive 1 dose of PCV13. The PCV13 vaccine dose should be obtained 1 or more years after the last PPSV23 vaccine dose.  Pneumococcal polysaccharide (PPSV23) vaccine. When PCV13 is also indicated, PCV13 should be obtained first. All adults aged 58 years and older should be immunized. An adult younger than age 65 years who has certain medical conditions should be immunized. Any person who resides in a nursing home or long-term care facility should be immunized. An adult smoker should be immunized. People with an immunocompromised condition and certain other conditions should receive both PCV13 and PPSV23 vaccines. People with human immunodeficiency virus (HIV) infection should be immunized as soon as possible after diagnosis. Immunization during chemotherapy or radiation therapy should be avoided. Routine use of PPSV23 vaccine is not recommended for American Indians, [place] Natives, or people younger than 65 years unless there are medical conditions that require PPSV23 vaccine. When indicated,  people who have unknown immunization and have no record of immunization should receive PPSV23 vaccine. One-time revaccination 5 years after the first dose of PPSV23 is recommended for people aged 70 64 years who have chronic kidney failure, nephrotic syndrome, asplenia, or immunocompromised conditions. People who received 1 2 doses of PPSV23 before age 32 years should receive another dose of PPSV23 vaccine at age 96 years or later if at least 5 years have passed since the previous dose. Doses of PPSV23 are not needed for people immunized with PPSV23 at or after age 55 years.  Meningococcal vaccine. Adults with asplenia or persistent complement component deficiencies should receive 2 doses of quadrivalent meningococcal conjugate (MenACWY-D) vaccine. The doses should be obtained at least 2 months apart. Microbiologists working with certain meningococcal bacteria, [employer] recruits, people at risk during an outbreak, and people who travel to or live in countries with a high rate of meningitis should be immunized. A first-year college student up through age 58 years who is living in a residence hall should receive a dose if she did not receive a dose on or after her 16th birthday. Adults who have certain high-risk conditions should receive one or more doses of vaccine.  Hepatitis A vaccine. Adults who wish to be protected from this disease, have certain high-risk conditions, work with hepatitis A-infected animals, work in hepatitis A research labs, or travel to or work in countries with a high rate of hepatitis A should be  immunized. Adults who were previously unvaccinated and who anticipate close contact with an international adoptee during the first 60 days after arrival in the [place] from a country with a high rate of hepatitis A should be immunized.  Hepatitis B vaccine.  Adults who wish to be protected from this disease, have certain high-risk conditions, may be exposed to blood or other infectious  body fluids, are household contacts or sex partners of hepatitis B positive people, are clients or workers in certain care facilities, or travel to or work in countries with a high rate of hepatitis B should be immunized.  Haemophilus influenzae type b (Hib) vaccine. A previously unvaccinated person with asplenia or sickle cell disease or having a scheduled splenectomy should receive 1 dose of Hib vaccine. Regardless of previous immunization, a recipient of a hematopoietic stem cell transplant should receive a 3-dose series 6 12 months after her successful transplant. Hib vaccine is not recommended for adults with HIV infection.  Preventive Services / Frequency Ages 6 to 39years  Blood pressure check.** / Every 1 to 2 years.  Lipid and cholesterol check.** / Every 5 years beginning at age 39.  Clinical breast exam.** / Every 3 years for women in their 61s and 62s.  BRCA-related cancer risk assessment.** / For women who have family members with a BRCA-related cancer (breast, ovarian, tubal, or peritoneal cancers).  Pap test.** / Every 2 years from ages 47 through 85. Every 3 years starting at age 34 through age 12 or 74 with a history of 3 consecutive normal Pap tests.  HPV screening.** / Every 3 years from ages 46 through ages 43 to 54 with a history of 3 consecutive normal Pap tests.  Hepatitis C blood test.** / For any individual with known risks for hepatitis C.  Skin self-exam. / Monthly.  Influenza vaccine. / Every year.  Tetanus, diphtheria, and acellular pertussis (Tdap, Td) vaccine.** / Consult your health care provider. Pregnant women should receive 1 dose of Tdap vaccine during each pregnancy. 1 dose of Td every 10 years.  Varicella vaccine.** / Consult your health care provider. Pregnant females who do not have evidence of immunity should receive the first dose after pregnancy.  HPV vaccine. / 3 doses over 6 months, if 64 and younger. The vaccine is not recommended for use in  pregnant females. However, pregnancy testing is not needed before receiving a dose.  Measles, mumps, rubella (MMR) vaccine.** / You need at least 1 dose of MMR if you were born in 1957 or later. You may also need a 2nd dose. For females of childbearing age, rubella immunity should be determined. If there is no evidence of immunity, females who are not pregnant should be vaccinated. If there is no evidence of immunity, females who are pregnant should delay immunization until after pregnancy.  Pneumococcal 13-valent conjugate (PCV13) vaccine.** / Consult your health care provider.  Pneumococcal polysaccharide (PPSV23) vaccine.** / 1 to 2 doses if you smoke cigarettes or if you have certain conditions.  Meningococcal vaccine.** / 1 dose if you are age 71 to 37 years and a Market researcher living in a residence hall, or have one of several medical conditions, you need to get vaccinated against meningococcal disease. You may also need additional booster doses.  Hepatitis A vaccine.** / Consult your health care provider.  Hepatitis B vaccine.** / Consult your health care provider.  Haemophilus influenzae type b (Hib) vaccine.** / Consult your health care provider.  Ages 55 to 64years  Blood pressure check.** / Every 1 to 2 years.  Lipid and cholesterol check.** / Every 5 years beginning at age 20 years.  Lung cancer screening. / Every year if you are aged 55 80 years and have a 30-pack-year history of smoking and currently smoke or have quit within the past 15 years. Yearly screening is stopped once you have quit smoking for at least 15 years or develop a health problem that would prevent you from having lung cancer treatment.  Clinical breast exam.** / Every year after age 40 years.  BRCA-related cancer risk assessment.** / For women who have family members with a BRCA-related cancer (breast, ovarian, tubal, or peritoneal cancers).  Mammogram.** / Every year beginning at age 40  years and continuing for as long as you are in good health. Consult with your health care provider.  Pap test.** / Every 3 years starting at age 30 years through age 65 or 70 years with a history of 3 consecutive normal Pap tests.  HPV screening.** / Every 3 years from ages 30 years through ages 65 to 70 years with a history of 3 consecutive normal Pap tests.  Fecal occult blood test (FOBT) of stool. / Every year beginning at age 50 years and continuing until age 75 years. You may not need to do this test if you get a colonoscopy every 10 years.  Flexible sigmoidoscopy or colonoscopy.** / Every 5 years for a flexible sigmoidoscopy or every 10 years for a colonoscopy beginning at age 50 years and continuing until age 75 years.  Hepatitis C blood test.** / For all people born from 1945 through 1965 and any individual with known risks for hepatitis C.  Skin self-exam. / Monthly.  Influenza vaccine. / Every year.  Tetanus, diphtheria, and acellular pertussis (Tdap/Td) vaccine.** / Consult your health care provider. Pregnant women should receive 1 dose of Tdap vaccine during each pregnancy. 1 dose of Td every 10 years.  Varicella vaccine.** / Consult your health care provider. Pregnant females who do not have evidence of immunity should receive the first dose after pregnancy.  Zoster vaccine.** / 1 dose for adults aged 60 years or older.  Measles, mumps, rubella (MMR) vaccine.** / You need at least 1 dose of MMR if you were born in 1957 or later. You may also need a 2nd dose. For females of childbearing age, rubella immunity should be determined. If there is no evidence of immunity, females who are not pregnant should be vaccinated. If there is no evidence of immunity, females who are pregnant should delay immunization until after pregnancy.  Pneumococcal 13-valent conjugate (PCV13) vaccine.** / Consult your health care provider.  Pneumococcal polysaccharide (PPSV23) vaccine.** / 1 to 2 doses if  you smoke cigarettes or if you have certain conditions.  Meningococcal vaccine.** / Consult your health care provider.  Hepatitis A vaccine.** / Consult your health care provider.  Hepatitis B vaccine.** / Consult your health care provider.  Haemophilus influenzae type b (Hib) vaccine.** / Consult your health care provider.  Ages 65 years and over  Blood pressure check.** / Every 1 to 2 years.  Lipid and cholesterol check.** / Every 5 years beginning at age 20 years.  Lung cancer screening. / Every year if you are aged 55 80 years and have a 30-pack-year history of smoking and currently smoke or have quit within the past 15 years. Yearly screening is stopped once you have quit smoking for at least 15 years or develop a health problem that   would prevent you from having lung cancer treatment.  Clinical breast exam.** / Every year after age 103 years.  BRCA-related cancer risk assessment.** / For women who have family members with a BRCA-related cancer (breast, ovarian, tubal, or peritoneal cancers).  Mammogram.** / Every year beginning at age 36 years and continuing for as long as you are in good health. Consult with your health care provider.  Pap test.** / Every 3 years starting at age 5 years through age 85 or 10 years with 3 consecutive normal Pap tests. Testing can be stopped between 65 and 70 years with 3 consecutive normal Pap tests and no abnormal Pap or HPV tests in the past 10 years.  HPV screening.** / Every 3 years from ages 93 years through ages 70 or 45 years with a history of 3 consecutive normal Pap tests. Testing can be stopped between 65 and 70 years with 3 consecutive normal Pap tests and no abnormal Pap or HPV tests in the past 10 years.  Fecal occult blood test (FOBT) of stool. / Every year beginning at age 8 years and continuing until age 45 years. You may not need to do this test if you get a colonoscopy every 10 years.  Flexible sigmoidoscopy or colonoscopy.** /  Every 5 years for a flexible sigmoidoscopy or every 10 years for a colonoscopy beginning at age 69 years and continuing until age 68 years.  Hepatitis C blood test.** / For all people born from [birth year] through 1967 and any individual with known risks for hepatitis C.  Osteoporosis screening.** / A one-time screening for women ages 7 years and over and women at risk for fractures or osteoporosis.  Skin self-exam. / Monthly.  Influenza vaccine. / Every year.  Tetanus, diphtheria, and acellular pertussis (Tdap/Td) vaccine.** / 1 dose of Td every 10 years.  Varicella vaccine.** / Consult your health care provider.  Zoster vaccine.** / 1 dose for adults aged 5 years or older.  Pneumococcal 13-valent conjugate (PCV13) vaccine.** / Consult your health care provider.  Pneumococcal polysaccharide (PPSV23) vaccine.** / 1 dose for all adults aged 74 years and older.  Meningococcal vaccine.** / Consult your health care provider.  Hepatitis A vaccine.** / Consult your health care provider.  Hepatitis B vaccine.** / Consult your health care provider.  Haemophilus influenzae type b (Hib) vaccine.** / Consult your health care provider. ** Family history and personal history of risk and conditions may change your health care provider's recommendations. Document Released: 01/17/2002 Document Revised: 09/11/2013  [facility] Patient Information 2014 [place], [place].   EXERCISE AND DIET:  We recommended that you start or continue a regular exercise program for good health. Regular exercise means any activity that makes your heart beat faster and makes you sweat.  We recommend exercising at least 30 minutes per day at least 3 days a week, preferably 5.  We also recommend a diet low in fat and sugar / carbohydrates.  Inactivity, poor dietary choices and obesity can cause diabetes, heart attack, stroke, and kidney damage, among others.     ALCOHOL AND SMOKING:  Women should limit their alcohol intake to no  more than 7 drinks/beers/glasses of wine (combined, not each!) per week. Moderation of alcohol intake to this level decreases your risk of breast cancer and liver damage.  ( And of course, no recreational drugs are part of a healthy lifestyle.)  Also, you should not be smoking at all or even being exposed to second hand smoke. Most people know smoking can  cause cancer, and various heart and lung diseases, but did you know it also contributes to weakening of your bones?  Aging of your skin?  Yellowing of your teeth and nails?   CALCIUM AND VITAMIN D:  Adequate intake of calcium and Vitamin D are recommended.  The recommendations for exact amounts of these supplements seem to change often, but generally speaking 600 mg of calcium (either carbonate or citrate) and 800 units of Vitamin D per day seems prudent. Certain women may benefit from higher intake of Vitamin D.  If you are among these women, your doctor will have told you during your visit.     PAP SMEARS:  Pap smears, to check for cervical cancer or precancers,  have traditionally been done yearly, although recent scientific advances have shown that most women can have pap smears less often.  However, every woman still should have a physical exam from her gynecologist or primary care physician every year. It will include a breast check, inspection of the vulva and vagina to check for abnormal growths or skin changes, a visual exam of the cervix, and then an exam to evaluate the size and shape of the uterus and ovaries.  And after 24 years of age, a rectal exam is indicated to check for rectal cancers. We will also provide age appropriate advice regarding health maintenance, like when you should have certain vaccines, screening for sexually transmitted diseases, bone density testing, colonoscopy, mammograms, etc.    MAMMOGRAMS:  All women over 65 years old should have a yearly mammogram. Many facilities now offer a "3D" mammogram, which may cost  around $50 extra out of pocket. If possible,  we recommend you accept the option to have the 3D mammogram performed.  It both reduces the number of women who will be called back for extra views which then turn out to be normal, and it is better than the routine mammogram at detecting truly abnormal areas.     COLONOSCOPY:  Colonoscopy to screen for colon cancer is recommended for all women at age 26.  We know, you hate the idea of the prep.  We agree, BUT, having colon cancer and not knowing it is worse!!  Colon cancer so often starts as a polyp that can be seen and removed at colonscopy, which can quite literally save your life!  And if your first colonoscopy is normal and you have no family history of colon cancer, most women don't have to have it again for 10 years.  Once every ten years, you can do something that may end up saving your life, right?  We will be happy to help you get it scheduled when you are ready.  Be sure to check your insurance coverage so you understand how much it will cost.  It may be covered as a preventative service at no cost, but you should check your particular policy.    Mediterranean Diet A Mediterranean diet refers to food and lifestyle choices that are based on the traditions of countries located on the [employer]. This way of eating has been shown to help prevent certain conditions and improve outcomes for people who have chronic diseases, like kidney disease and heart disease. What are tips for following this plan? Lifestyle  Cook and eat meals together with your family, when possible.  Drink enough fluid to keep your urine clear or pale yellow.  Be physically active every day. This includes: ? Aerobic exercise like running or swimming. ? Leisure activities like  gardening, walking, or housework.  Get 7-8 hours of sleep each night.  If recommended by your health care provider, drink red wine in moderation. This means 1 glass a day for nonpregnant women  and 2 glasses a day for men. A glass of wine equals 5 oz (150 mL). Reading food labels  Check the serving size of packaged foods. For foods such as rice and pasta, the serving size refers to the amount of cooked product, not dry.  Check the total fat in packaged foods. Avoid foods that have saturated fat or trans fats.  Check the ingredients list for added sugars, such as corn syrup. Shopping  At the grocery store, buy most of your food from the areas near the walls of the store. This includes: ? Fresh fruits and vegetables (produce). ? Grains, beans, nuts, and seeds. Some of these may be available in unpackaged forms or large amounts (in bulk). ? Fresh seafood. ? Poultry and eggs. ? Low-fat dairy products.  Buy whole ingredients instead of prepackaged foods.  Buy fresh fruits and vegetables in-season from local farmers markets.  Buy frozen fruits and vegetables in resealable bags.  If you do not have access to quality fresh seafood, buy precooked frozen shrimp or canned fish, such as tuna, salmon, or sardines.  Buy small amounts of raw or cooked vegetables, salads, or olives from the deli or salad bar at your store.  Stock your pantry so you always have certain foods on hand, such as olive oil, canned tuna, canned tomatoes, rice, pasta, and beans. Cooking  Cook foods with extra-virgin olive oil instead of using butter or other vegetable oils.  Have meat as a side dish, and have vegetables or grains as your main dish. This means having meat in small portions or adding small amounts of meat to foods like pasta or stew.  Use beans or vegetables instead of meat in common dishes like chili or lasagna.  Experiment with different cooking methods. Try roasting or broiling vegetables instead of steaming or sauteing them.  Add frozen vegetables to soups, stews, pasta, or rice.  Add nuts or seeds for added healthy fat at each meal. You can add these to yogurt, salads, or vegetable  dishes.  Marinate fish or vegetables using olive oil, lemon juice, garlic, and fresh herbs. Meal planning  Plan to eat 1 vegetarian meal one day each week. Try to work up to 2 vegetarian meals, if possible.  Eat seafood 2 or more times a week.  Have healthy snacks readily available, such as: ? Vegetable sticks with hummus. ? [place] yogurt. ? Fruit and nut trail mix.  Eat balanced meals throughout the week. This includes: ? Fruit: 2-3 servings a day ? Vegetables: 4-5 servings a day ? Low-fat dairy: 2 servings a day ? Fish, poultry, or lean meat: 1 serving a day ? Beans and legumes: 2 or more servings a week ? Nuts and seeds: 1-2 servings a day ? Whole grains: 6-8 servings a day ? Extra-virgin olive oil: 3-4 servings a day  Limit red meat and sweets to only a few servings a month What are my food choices?  Mediterranean diet ? Recommended ? Grains: Whole-grain pasta. Brown rice. Bulgar wheat. Polenta. Couscous. Whole-wheat bread. [place]. ? Vegetables: Artichokes. Beets. Broccoli. Cabbage. Carrots. Eggplant. Green beans. Chard. Kale. Spinach. Onions. Leeks. Peas. Squash. Tomatoes. Peppers. Radishes. ? Fruits: Apples. Apricots. Avocado. Berries. Bananas. Cherries. Dates. Figs. Grapes. Lemons. Melon. Oranges. Peaches. Plums. Pomegranate. ? Meats and other protein  foods: Beans. Almonds. Sunflower seeds. Pine nuts. Peanuts. [place]. Salmon. Scallops. Shrimp. [place]. Tilapia. Clams. Oysters. Eggs. ? Dairy: Low-fat milk. Cheese. Greek yogurt. ? Beverages: Water. Red wine. Herbal tea. ? Fats and oils: Extra virgin olive oil. Avocado oil. Grape seed oil. ? Sweets and desserts: [place] yogurt with honey. Baked apples. Poached pears. Trail mix. ? Seasoning and other foods: Basil. Cilantro. Coriander. Cumin. Mint. Parsley. Sage. Rosemary. Tarragon. Garlic. Oregano. Thyme. Pepper. Balsalmic vinegar. Tahini. Hummus. Tomato sauce. Olives. Mushrooms. ? Limit these ? Grains: Prepackaged pasta or  rice dishes. Prepackaged cereal with added sugar. ? Vegetables: Deep fried potatoes (french fries). ? Fruits: Fruit canned in syrup. ? Meats and other protein foods: Beef. Pork. Lamb. Poultry with skin. Hot dogs. [place] Salines. ? Dairy: Ice cream. Sour cream. Whole milk. ? Beverages: Juice. Sugar-sweetened soft drinks. Beer. Liquor and spirits. ? Fats and oils: Butter. Canola oil. Vegetable oil. Beef fat (tallow). Lard. ? Sweets and desserts: Cookies. Cakes. Pies. Candy. ? Seasoning and other foods: Mayonnaise. Premade sauces and marinades. ? The items listed may not be a complete list. Talk with your dietitian about what dietary choices are right for you. Summary  The Mediterranean diet includes both food and lifestyle choices.  Eat a variety of fresh fruits and vegetables, beans, nuts, seeds, and whole grains.  Limit the amount of red meat and sweets that you eat.  Talk with your health care provider about whether it is safe for you to drink red wine in moderation. This means 1 glass a day for nonpregnant women and 2 glasses a day for men. A glass of wine equals 5 oz (150 mL). This information is not intended to replace advice given to you by your health care provider. Make sure you discuss any questions you have with your health care provider. Document Released: 07/14/2016 Document Revised: 08/16/2016 Document Reviewed: 07/14/2016 Elsevier Interactive Patient Education  2018 [place] all medications as directed. Please use Clotrimazole 1% solution- 1 application twice daily. Referral to behavioral health placed, re: increased depression since fall season started. Recommend taking 10-15 min stroll outside 1-2 times/day to help with mood. Urinalysis is normal. Increase water intake, strive for at least 68 ounces/day.   Follow Mediterranean diet Recommend follow-up in 6 months, sooner if needed. NICE TO SEE YOU!

## 2018-09-19 NOTE — Assessment & Plan Note (Signed)
Referred to Behavioral Health.  

## 2018-09-19 NOTE — Assessment & Plan Note (Signed)
Followed by Neurology  Monthly Aimovig injections She did not start Propranolol 80mg  QD

## 2018-09-19 NOTE — Telephone Encounter (Signed)
[facility] is having an issue with a prescription and needs a call back. Please call Judeth Cornfield at [phone]

## 2018-09-19 NOTE — Assessment & Plan Note (Signed)
Denies suicidal thoughts  Currently on Fluoxetine 40mg  Referral to Behavioral Health placed

## 2018-09-19 NOTE — Assessment & Plan Note (Signed)
Continue all medications as directed. Please use Clotrimazole 1% solution- 1 application twice daily. Referral to behavioral health placed, re: increased depression since fall season started. Recommend taking 10-15 min stroll outside 1-2 times/day to help with mood. Urinalysis is normal. Increase water intake, strive for at least68 ounces/day.   Follow Mediterranean diet Recommend follow-up in 6 months, sooner if needed.

## 2018-09-19 NOTE — Telephone Encounter (Signed)
[facility] requested new insurance card for [employer].  Copy of card faxed.  Tiajuana Amass, CMA

## 2018-09-19 NOTE — Assessment & Plan Note (Signed)
UA (neg)

## 2018-09-28 ENCOUNTER — Encounter: Payer: Self-pay | Admitting: Adult Health

## 2018-10-02 ENCOUNTER — Ambulatory Visit (INDEPENDENT_AMBULATORY_CARE_PROVIDER_SITE_OTHER): Payer: 59 | Admitting: Family Medicine

## 2018-10-02 ENCOUNTER — Encounter: Payer: Self-pay | Admitting: Family Medicine

## 2018-10-02 VITALS — BP 111/75 | HR 75 | Wt 211.0 lb

## 2018-10-02 DIAGNOSIS — R599 Enlarged lymph nodes, unspecified: Secondary | ICD-10-CM

## 2018-10-02 DIAGNOSIS — R102 Pelvic and perineal pain: Secondary | ICD-10-CM

## 2018-10-02 DIAGNOSIS — Z90710 Acquired absence of both cervix and uterus: Secondary | ICD-10-CM

## 2018-10-02 DIAGNOSIS — F338 Other recurrent depressive disorders: Secondary | ICD-10-CM

## 2018-10-02 NOTE — Assessment & Plan Note (Signed)
Well healed. May return to work, though 12 hour shifts will be hard. May start with 8 hour shifts x 7 days until 8 weeks postop.

## 2018-10-02 NOTE — Progress Notes (Signed)
   Subjective:    Patient ID: [name] Sanchez is a 22 y.o. female presenting with Routine Post Op  on 10/02/2018  HPI: Feels like she has had some pain on left side, like she has a cyst. Notes pain with overdoing it and has noted increased pain. Also reports swelling in her left axilla. It is getting larger. It is not significantly tender. No recent infection. No breast masses noted.  Review of Systems  Constitutional: Negative for chills and fever.  Respiratory: Negative for shortness of breath.   Cardiovascular: Negative for chest pain.  Gastrointestinal: Negative for abdominal pain, nausea and vomiting.  Genitourinary: Negative for dysuria.  Skin: Negative for rash.      Objective:    BP 111/75   Pulse 75   Wt 211 lb (95.7 kg)   LMP 07/15/2018 (Exact Date)   BMI 36.22 kg/m  Physical Exam  Constitutional: She is oriented to person, place, and time. She appears well-developed and well-nourished. No distress.  HENT:  Head: Normocephalic and atraumatic.  Eyes: No scleral icterus.  Neck: Neck supple.  Cardiovascular: Normal rate.  Pulmonary/Chest: Effort normal.  Abdominal: Soft.  Genitourinary:  Genitourinary Comments: Vaginal cuff examined and well healed.  Lymphadenopathy:    She has axillary adenopathy.       Left axillary: Lateral adenopathy present.  Neurological: She is alert and oriented to person, place, and time.  Skin: Skin is warm and dry.  Psychiatric: She has a normal mood and affect.        Assessment & Plan:   Problem List Items Addressed This Visit      Unprioritized   Pelvic pain    Reports significant improvement in pain, from surgery, though she has not attempted intercourse as yet.      Status post hysterectomy    Well healed. May return to work, though 12 hour shifts will be hard. May start with 8 hour shifts x 7 days until 8 weeks postop.      Seasonal affective disorder (HCC)   Swelling of lymph node - Primary    Will check u/s of  breast and axilla      Relevant Orders   US BREAST LTD UNI LEFT INC AXILLA      Total face-to-face time with patient: 15 minutes. Over 50% of encounter was spent on counseling and coordination of care. Return if symptoms worsen or fail to improve.  Reva Bores 10/02/2018 8:16 AM

## 2018-10-02 NOTE — Assessment & Plan Note (Signed)
Will check u/s of breast and axilla

## 2018-10-02 NOTE — Assessment & Plan Note (Signed)
Reports significant improvement in pain, from surgery, though she has not attempted intercourse as yet.

## 2018-10-08 ENCOUNTER — Ambulatory Visit
Admission: RE | Admit: 2018-10-08 | Discharge: 2018-10-08 | Disposition: A | Discharge status: Home / Self Care | Payer: 59 | Source: Ambulatory Visit | Attending: Family Medicine | Admitting: Family Medicine

## 2018-10-08 ENCOUNTER — Other Ambulatory Visit: Payer: Self-pay | Admitting: Family Medicine

## 2018-10-08 DIAGNOSIS — R599 Enlarged lymph nodes, unspecified: Secondary | ICD-10-CM

## 2018-10-08 DIAGNOSIS — R2232 Localized swelling, mass and lump, left upper limb: Secondary | ICD-10-CM

## 2018-10-08 MED FILL — AIMOVIG 140 MG/ML SOAJ: 140 | 30 days supply | Qty: 1 | Fill #2

## 2018-10-09 ENCOUNTER — Encounter: Payer: Self-pay | Admitting: Radiology

## 2018-10-11 ENCOUNTER — Ambulatory Visit
Admission: RE | Admit: 2018-10-11 | Discharge: 2018-10-11 | Disposition: A | Discharge status: Home / Self Care | Payer: 59 | Source: Ambulatory Visit | Attending: Family Medicine | Admitting: Family Medicine

## 2018-10-11 DIAGNOSIS — R2232 Localized swelling, mass and lump, left upper limb: Secondary | ICD-10-CM

## 2018-11-12 ENCOUNTER — Encounter: Payer: Self-pay | Admitting: Adult Health

## 2018-11-12 ENCOUNTER — Ambulatory Visit (INDEPENDENT_AMBULATORY_CARE_PROVIDER_SITE_OTHER): Payer: 59 | Admitting: Adult Health

## 2018-11-12 VITALS — BP 114/76 | HR 72 | Resp 17 | Wt 212.0 lb

## 2018-11-12 DIAGNOSIS — F329 Major depressive disorder, single episode, unspecified: Secondary | ICD-10-CM

## 2018-11-12 DIAGNOSIS — F419 Anxiety disorder, unspecified: Secondary | ICD-10-CM

## 2018-11-12 DIAGNOSIS — R0789 Other chest pain: Secondary | ICD-10-CM | POA: Diagnosis not present

## 2018-11-12 DIAGNOSIS — F418 Other specified anxiety disorders: Secondary | ICD-10-CM

## 2018-11-12 DIAGNOSIS — F32A Depression, unspecified: Secondary | ICD-10-CM

## 2018-11-12 MED ORDER — ALPRAZOLAM 0.5 MG PO TABS: 0.5000 mg | ORAL_TABLET | Freq: Every day | ORAL | 0 refills | Status: AC | PRN

## 2018-11-12 NOTE — Progress Notes (Signed)
Subjective:    Patient ID: [name] Sanchez, female    DOB: 1996-01-04, 22 y.o.   MRN: [id number]  HPI:09/19/18 OV:   [name] is here for CPE She underwent hysterectomy 08/15/18- she has had 2 week f/u and will have final follow-up Oct 29,2019. She cannot lift >10 lbs and is off of work on short term disability. Her sister recently returned home and she reports the change of season and decrease of tome of sunlight has dramatically increased her depression. She reports sleeping more than usual, having little energy, and poor appetite the last 3 weeks. She denies suicidal thoughts/ideations. 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 She reports that she and her husband are in "the happiest we have been in 10 years". She denies tobacco/ETOH use She reports urinary frequency the last 2 weeks, estimates to only be drinking 40 oz water/day  11/12/18 OV: [name] presents with acute anxiety and increase in panic attacks with chest heaviness and dyspnea during onset of attack. She estimates to have had 2 or 3 attacks in the last 2 weeks. She was fired from her job 2 weeks ago and her father in law had emergency hand surgery on Thanksgiving. She reports that her husband has been "ill-tempered" due to lack of sleep since "he has been working town jobs and I out of my job". She denies thoughts of harming herself/others She reports "getting really annoyed with my children and husband and being around a lot of people". Referral to Behavioral Health was placed 09/2018, office called to schedule new pt appt, she never make appt and states 'I just don't want to talk to someone else". She also reports "feeling weak and I seem to just keep falling" She is followed by [facility] and most recent CT results are below-  08/03/18 CT HEAD  WO- IMPRESSION: Limited evaluation of the brain due to severe motion artifact and nondiagnostic study of the lower portion of the brain and base of the skull. No acute intracranial pathology identified in the upper portion of the brain. If there is high clinical concern for acute intracranial pathology, repeat CT is recommended. Patient Care Team    Relationship Specialty Notifications Start End  William Hamburgeranford, Katy D, NP PCP - General Family Medicine  07/17/18   Reva BoresPratt, Tanya S, MD Consulting Physician Obstetrics and Gynecology  07/17/18   [place], [facility]    09/28/18     Patient Active Problem List   Diagnosis Date Noted  . Swelling of lymph node 10/02/2018  . Seasonal affective disorder (HCC) 09/19/2018  . Urinary frequency 09/19/2018  . Status post hysterectomy 08/14/2018  . Acute post-traumatic headache, not intractable 07/30/2018  . Pelvic pain 07/31/2017  . Chronic migraine without aura without status migrainosus, not intractable 05/06/2016  . Depression with anxiety 04/04/2012  . Healthcare maintenance 04/01/2012  . Migraine with aura      Past Medical History:  Diagnosis Date  . Anxiety   . Complication of anesthesia    wakes up crying  . Depression   . Endometriosis   . Family history of adverse reaction to anesthesia    hard to wake up, low heart rate  . Gestational hypertension 01/2012   also had postpartum preelampsia  . History of stomach ulcers   . Hx of ovarian cyst  x 2-3  . Hx of varicella   . IBS (irritable bowel syndrome)   . Migraine    chronic  . Panic attack   . Pinched nerve    left arm  . Plica of knee 08/2012   medial plica left knee     Past Surgical History:  Procedure Laterality Date  . KNEE ARTHROSCOPY  08/17/2012   Procedure: ARTHROSCOPY KNEE;  Surgeon: Harvie Junior, MD;  Location: [facility];  Service: Orthopedics;  Laterality: Left;  left knee scope with tight lateral retinacular release and plica  removal  . TYMPANOSTOMY TUBE PLACEMENT Bilateral 1998  . VAGINAL HYSTERECTOMY Bilateral 08/14/2018   Procedure: HYSTERECTOMY VAGINAL WITH SALPINGECTOMY;  Surgeon: Reva Bores, MD;  Location: [facility];  Service: Gynecology;  Laterality: Bilateral;     Family History  Problem Relation Age of Onset  . Hypertension Mother   . Heart disease Mother        MI  . Anesthesia problems Mother        post-op N/V  . Depression Mother   . Anxiety disorder Mother   . Obesity Mother   . Congestive Heart Failure Mother   . Asthma Sister   . Bipolar disorder Sister   . Asthma Brother   . Diabetes Maternal Aunt   . Heart disease Father   . Bipolar disorder Father   . Hyperlipidemia Father   . Uterine cancer Maternal Grandmother   . Pancreatic cancer Maternal Grandmother   . Lung cancer Maternal Grandfather      Social History   Substance and Sexual Activity  Drug Use No     Social History   Substance and Sexual Activity  Alcohol Use Not Currently  . Alcohol/week: 0.0 standard drinks   Comment: occassionally     Social History   Tobacco Use  Smoking Status Never Smoker  Smokeless Tobacco Never Used     Outpatient Encounter Medications as of 11/12/2018  Medication Sig Note  . acetaminophen (TYLENOL 8 HOUR) 650 MG CR tablet Take 1 tablet (650 mg total) by mouth every 8 (eight) hours as needed. (Patient taking differently: Take 650 mg by mouth every 8 (eight) hours as needed for pain. )   . ALPRAZolam (XANAX) 0.5 MG tablet Take 1 tablet (0.5 mg total) by mouth daily as needed for anxiety.   Dorise Hiss (AIMOVIG) 140 MG/ML SOAJ Inject 140 mg into the skin every 30 (thirty) days.   Marland Kitchen FLUoxetine (PROZAC) 40 MG capsule Take 1 capsule (40 mg total) by mouth daily.   Marland Kitchen ibuprofen (ADVIL,MOTRIN) 600 MG tablet Take 1 tablet (600 mg total) by mouth every 6 (six) hours as needed.   . ondansetron (ZOFRAN) 4 MG tablet Take 4 mg by mouth every 8 (eight) hours as needed for nausea or  vomiting.    . promethazine (PHENERGAN) 25 MG suppository Place 1 suppository (25 mg total) rectally every 6 (six) hours as needed for nausea or vomiting.   . promethazine (PHENERGAN) 25 MG tablet Take 25 mg by mouth every 6 (six) hours as needed for nausea or vomiting.   Marland Kitchen tiZANidine (ZANAFLEX) 4 MG tablet Take 1 tablet (4 mg total) by mouth every 6 (six) hours as needed for muscle spasms. Or headaches.   . [DISCONTINUED] ALPRAZolam (XANAX) 0.5 MG tablet Take 1 tablet (0.5 mg total) by mouth daily as needed for anxiety.   . [DISCONTINUED] clotrimazole (LOTRIMIN) 1 % external solution Apply 1 application topically 2 (two) times daily.   . [  DISCONTINUED] HYDROmorphone (DILAUDID) 2 MG tablet Take 1 tablet (2 mg total) by mouth every 4 (four) hours as needed for severe pain. (Patient not taking: Reported on 10/02/2018)   . [DISCONTINUED] oxyCODONE-acetaminophen (PERCOCET/ROXICET) 5-325 MG tablet Take 1-2 tablets by mouth every 6 (six) hours as needed. (Patient not taking: Reported on 10/02/2018)   . [DISCONTINUED] PROAIR HFA 108 (90 Base) MCG/ACT inhaler Inhale 1-2 puffs into the lungs every 4 (four) hours as needed for wheezing or shortness of breath.  08/08/2018: Only took when she had bronchitis  . [DISCONTINUED] propranolol ER (INDERAL LA) 80 MG 24 hr capsule Take 1 capsule (80 mg total) by mouth daily. 08/14/2018: Not started   No facility-administered encounter medications on file as of 11/12/2018.     Allergies: Percocet [oxycodone-acetaminophen]; Cefuroxime axetil; and Penicillins  Body mass index is 36.39 kg/m.  Blood pressure 114/76, pulse 72, resp. rate 17, weight 212 lb (96.2 kg), last menstrual period 07/15/2018, SpO2 99 %.  Review of Systems  Constitutional: Positive for fatigue. Negative for activity change, appetite change, chills, diaphoresis, fever and unexpected weight change.  HENT: Negative for congestion.   Eyes: Negative for visual disturbance.  Respiratory: Negative for  cough, chest tightness, shortness of breath, wheezing and stridor.   Cardiovascular: Negative for chest pain, palpitations and leg swelling.  Gastrointestinal: Negative for abdominal distention, abdominal pain, blood in stool, constipation, diarrhea, nausea and vomiting.  Endocrine: Negative for cold intolerance, heat intolerance, polydipsia, polyphagia and polyuria.  Musculoskeletal: Negative for arthralgias, back pain, gait problem, joint swelling, myalgias, neck pain and neck stiffness.  Skin: Negative for color change, pallor, rash and wound.  Neurological: Positive for weakness and headaches. Negative for dizziness.  Hematological: Does not bruise/bleed easily.  Psychiatric/Behavioral: Positive for dysphoric mood and sleep disturbance. Negative for behavioral problems, confusion, decreased concentration, hallucinations, self-injury and suicidal ideas. The patient is nervous/anxious. The patient is not hyperactive.        Objective:   Physical Exam  Constitutional: She is oriented to person, place, and time. She appears well-developed and well-nourished. No distress.  HENT:  Head: Normocephalic and atraumatic.  Right Ear: External ear normal. No decreased hearing is noted.  Left Ear: External ear normal. No decreased hearing is noted.  Nose: Nose normal.  Eyes: Pupils are equal, round, and reactive to light. Conjunctivae and EOM are normal.  Cardiovascular: Normal rate, regular rhythm, normal heart sounds and intact distal pulses.  Pulmonary/Chest: Effort normal and breath sounds normal. No stridor. No respiratory distress. She has no wheezes. She has no rales. She exhibits no tenderness.  Neurological: She is alert and oriented to person, place, and time. Coordination normal.  Skin: Skin is warm and dry. Capillary refill takes less than 2 seconds. No rash noted. She is not diaphoretic. No erythema. No pallor.  Psychiatric: Her speech is normal and behavior is normal. Judgment and  thought content normal. Her mood appears anxious. Cognition and memory are normal. She exhibits a depressed mood.  Well groomed, able to smile and laugh during OV  Nursing note and vitals reviewed.     Assessment & Plan:   1. Depression with anxiety   2. Anxiety and depression     Depression with anxiety [place] Controlled Substance Database reviewed. 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 Use Alprazolam sparingly Continue to walk as often as possible. Continue to hunt and engage in other calming hobbies. Recommend daily meditation. Recommend  establishing with Behavioral Health. If falls persist, please follow-up with your established Neurologist. Follow-up here in 3 months, sooner if needed.    FOLLOW-UP:  Return in about 3 months (around 02/11/2019) for Regular Follow Up.

## 2018-11-12 NOTE — Assessment & Plan Note (Addendum)
[place] Controlled Substance Database reviewed. 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 Use Alprazolam sparingly Continue to walk as often as possible. Continue to hunt and engage in other calming hobbies. Recommend daily meditation. Recommend establishing with Behavioral Health. If falls persist, please follow-up with your established Neurologist. Follow-up here in 3 months, sooner if needed.

## 2018-11-12 NOTE — Patient Instructions (Addendum)
Generalized Anxiety Disorder, Adult Generalized anxiety disorder (GAD) is a mental health disorder. People with this condition constantly worry about everyday events. Unlike normal anxiety, worry related to GAD is not triggered by a specific event. These worries also do not fade or get better with time. GAD interferes with life functions, including relationships, work, and school. GAD can vary from mild to severe. People with severe GAD can have intense waves of anxiety with physical symptoms (panic attacks). What are the causes? The exact cause of GAD is not known. What increases the risk? This condition is more likely to develop in:  Women.  People who have a family history of anxiety disorders.  People who are very shy.  People who experience very stressful life events, such as the death of a loved one.  People who have a very stressful family environment.  What are the signs or symptoms? People with GAD often worry excessively about many things in their lives, such as their health and family. They may also be overly concerned about:  Doing well at work.  Being on time.  Natural disasters.  Friendships.  Physical symptoms of GAD include:  Fatigue.  Muscle tension or having muscle twitches.  Trembling or feeling shaky.  Being easily startled.  Feeling like your heart is pounding or racing.  Feeling out of breath or like you cannot take a deep breath.  Having trouble falling asleep or staying asleep.  Sweating.  Nausea, diarrhea, or irritable bowel syndrome (IBS).  Headaches.  Trouble concentrating or remembering facts.  Restlessness.  Irritability.  How is this diagnosed? Your health care provider can diagnose GAD based on your symptoms and medical history. You will also have a physical exam. The health care provider will ask specific questions about your symptoms, including how severe they are, when they started, and if they come and go. Your health care  provider may ask you about your use of alcohol or drugs, including prescription medicines. Your health care provider may refer you to a mental health specialist for further evaluation. Your health care provider will do a thorough examination and may perform additional tests to rule out other possible causes of your symptoms. To be diagnosed with GAD, a person must have anxiety that:  Is out of his or her control.  Affects several different aspects of his or her life, such as work and relationships.  Causes distress that makes him or her unable to take part in normal activities.  Includes at least three physical symptoms of GAD, such as restlessness, fatigue, trouble concentrating, irritability, muscle tension, or sleep problems.  Before your health care provider can confirm a diagnosis of GAD, these symptoms must be present more days than they are not, and they must last for six months or longer. How is this treated? The following therapies are usually used to treat GAD:  Medicine. Antidepressant medicine is usually prescribed for long-term daily control. Antianxiety medicines may be added in severe cases, especially when panic attacks occur.  Talk therapy (psychotherapy). Certain types of talk therapy can be helpful in treating GAD by providing support, education, and guidance. Options include: ? Cognitive behavioral therapy (CBT). People learn coping skills and techniques to ease their anxiety. They learn to identify unrealistic or negative thoughts and behaviors and to replace them with positive ones. ? Acceptance and commitment therapy (ACT). This treatment teaches people how to be mindful as a way to cope with unwanted thoughts and feelings. ? Biofeedback. This process trains you to   manage your body's response (physiological response) through breathing techniques and relaxation methods. You will work with a therapist while machines are used to monitor your physical symptoms.  Stress  management techniques. These include yoga, meditation, and exercise.  A mental health specialist can help determine which treatment is best for you. Some people see improvement with one type of therapy. However, other people require a combination of therapies. Follow these instructions at home:  Take over-the-counter and prescription medicines only as told by your health care provider.  Try to maintain a normal routine.  Try to anticipate stressful situations and allow extra time to manage them.  Practice any stress management or self-calming techniques as taught by your health care provider.  Do not punish yourself for setbacks or for not making progress.  Try to recognize your accomplishments, even if they are small.  Keep all follow-up visits as told by your health care provider. This is important. Contact a health care provider if:  Your symptoms do not get better.  Your symptoms get worse.  You have signs of depression, such as: ? A persistently sad, cranky, or irritable mood. ? Loss of enjoyment in activities that used to bring you joy. ? Change in weight or eating. ? Changes in sleeping habits. ? Avoiding friends or family members. ? Loss of energy for normal tasks. ? Feelings of guilt or worthlessness. Get help right away if:  You have serious thoughts about hurting yourself or others. If you ever feel like you may hurt yourself or others, or have thoughts about taking your own life, get help right away. You can go to your nearest emergency department or call:  Your local emergency services (911 in the [place]  A suicide crisis helpline, such as the [facility] at 9172735199. This is open 24 hours a day.  Summary  Generalized anxiety disorder (GAD) is a mental health disorder that involves worry that is not triggered by a specific event.  People with GAD often worry excessively about many things in their lives, such as their health and  family.  GAD may cause physical symptoms such as restlessness, trouble concentrating, sleep problems, frequent sweating, nausea, diarrhea, headaches, and trembling or muscle twitching.  A mental health specialist can help determine which treatment is best for you. Some people see improvement with one type of therapy. However, other people require a combination of therapies. This information is not intended to replace advice given to you by your health care provider. Make sure you discuss any questions you have with your health care provider. Document Released: 03/18/2013 Document Revised: 10/11/2016 Document Reviewed: 10/11/2016 Elsevier Interactive Patient Education  2018 [place] Stress Reduction Mindfulness-based stress reduction (MBSR) is a program that helps people learn to practice mindfulness. Mindfulness is the practice of intentionally paying attention to the present moment. It can be learned and practiced through techniques such as education, breathing exercises, meditation, and yoga. MBSR includes several mindfulness techniques in one program. MBSR works best when you understand the treatment, are willing to try new things, and can commit to spending time practicing what you learn. MBSR training may include learning about:  How your emotions, thoughts, and reactions affect your body.  New ways to respond to things that cause negative thoughts to start (triggers).  How to notice your thoughts and let go of them.  Practicing awareness of everyday things that you normally do without thinking.  The techniques and goals of different types of meditation.  What are the benefits of MBSR? MBSR can have many benefits, which include helping you to:  Develop self-awareness. This refers to knowing and understanding yourself.  Learn skills and attitudes that help you to participate in your own health care.  Learn new ways to care for yourself.  Be more  accepting about how things are, and let things go.  Be less judgmental and approach things with an open mind.  Be patient with yourself and trust yourself more.  MBSR has also been shown to:  Reduce negative emotions, such as depression and anxiety.  Improve memory and focus.  Change how you sense and approach pain.  Boost your body's ability to fight infections.  Help you connect better with other people.  Improve your sense of well-being.  Follow these instructions at home:  Find a local in-person or online MBSR program.  Set aside some time regularly for mindfulness practice.  Find a mindfulness practice that works best for you. This may include one or more of the following: ? Meditation. Meditation involves focusing your mind on a certain thought or activity. ? Breathing awareness exercises. These help you to stay present by focusing on your breath. ? Body scan. For this practice, you lie down and pay attention to each part of your body from head to toe. You can identify tension and soreness and intentionally relax parts of your body. ? Yoga. Yoga involves stretching and breathing, and it can improve your ability to move and be flexible. It can also provide an experience of testing your body's limits, which can help you release stress. ? Mindful eating. This way of eating involves focusing on the taste, texture, color, and smell of each bite of food. Because this slows down eating and helps you feel full sooner, it can be an important part of a weight-loss plan.  Find a podcast or recording that provides guidance for breathing awareness, body scan, or meditation exercises. You can listen to these any time when you have a free moment to rest without distractions.  Follow your treatment plan as told by your health care provider. This may include taking regular medicines and making changes to your diet or lifestyle as recommended. How to practice mindfulness To do a basic  awareness exercise:  Find a comfortable place to sit.  Pay attention to the present moment. Observe your thoughts, feelings, and surroundings just as they are.  Avoid placing judgment on yourself, your feelings, or your surroundings. Make note of any judgment that comes up, and let it go.  Your mind may wander, and that is okay. Make note of when your thoughts drift, and return your attention to the present moment.  To do basic mindfulness meditation:  Find a comfortable place to sit. This may include a stable chair or a firm floor cushion. ? Sit upright with your back straight. Let your arms fall next to your side with your hands resting on your legs. ? If sitting in a chair, rest your feet flat on the floor. ? If sitting on a cushion, cross your legs in front of you.  Keep your head in a neutral position with your chin dropped slightly. Relax your jaw and rest the tip of your tongue on the roof of your mouth. Drop your gaze to the floor. You can close your eyes if you like.  Breathe normally and pay attention to your breath. Feel the air moving in and out of your nose. Feel your belly expanding and relaxing  with each breath.  Your mind may wander, and that is okay. Make note of when your thoughts drift, and return your attention to your breath.  Avoid placing judgment on yourself, your feelings, or your surroundings. Make note of any judgment or feelings that come up, let them go, and bring your attention back to your breath.  When you are ready, lift your gaze or open your eyes. Pay attention to how your body feels after the meditation.  Where to find more information: You can find more information about MBSR from:  Your health care provider.  Community-based meditation centers or programs.  Programs offered near you.  Summary  Mindfulness-based stress reduction (MBSR) is a program that teaches you how to intentionally pay attention to the present moment. It is used with  other treatments to help you cope better with daily stress, emotions, and pain.  MBSR focuses on developing self-awareness, which allows you to respond to life stress without judgment or negative emotions.  MBSR programs may involve learning different mindfulness practices, such as breathing exercises, meditation, yoga, body scan, or mindful eating. Find a mindfulness practice that works best for you, and set aside time for it on a regular basis. This information is not intended to replace advice given to you by your health care provider. Make sure you discuss any questions you have with your health care provider. Document Released: 03/30/2017 Document Revised: 03/30/2017 Document Reviewed: 03/30/2017 Elsevier Interactive Patient Education  2018 [employer]   Continue all medications as directed. Use Alprazolam sparingly- refill sent in. Continue to walk as often as possible. Continue to hunt and engage in other calming hobbies. Recommend daily meditation. Recommend establishing with Behavioral Health. If falls persist, please follow-up with your established Neurologist. Follow-up here in 3 months, sooner if needed.

## 2018-11-13 NOTE — Addendum Note (Signed)
Addended by: Stan HeadNELSON, [place] Rabenold S on: 11/13/2018 02:54 PM   Modules accepted: Orders

## 2018-12-20 ENCOUNTER — Telehealth: Payer: Self-pay | Admitting: Neurology

## 2018-12-20 NOTE — Telephone Encounter (Signed)
[name] Sanchez, I emailed patient and let her know we can give her 3 Aimovig samples until she gets insurance. I don't know what time she will be there but we can put her on the schedule. She will email me back on the time. thanks

## 2018-12-20 NOTE — Telephone Encounter (Signed)
Pt states that she lost her ins in Nov so she has not been able to do her migraine treatments because she can not pay out of pocket but she really would like to know what she can do because the OTC are not helping at all. Please advise.

## 2018-12-21 ENCOUNTER — Ambulatory Visit (INDEPENDENT_AMBULATORY_CARE_PROVIDER_SITE_OTHER): Payer: Self-pay | Admitting: *Deleted

## 2018-12-21 ENCOUNTER — Encounter: Payer: Self-pay | Admitting: Neurology

## 2018-12-21 DIAGNOSIS — G43709 Chronic migraine without aura, not intractable, without status migrainosus: Secondary | ICD-10-CM

## 2018-12-21 MED ORDER — ERENUMAB-AOOE 140 MG/ML ~~LOC~~ SOAJ: 140.0000 mg | SUBCUTANEOUS | 0 refills | Status: DC

## 2018-12-21 MED ORDER — KETOROLAC TROMETHAMINE 60 MG/2ML IM SOLN
60.0000 mg | Freq: Once | INTRAMUSCULAR | Status: AC
  Administered 2018-12-21: 60 mg via INTRAMUSCULAR

## 2018-12-21 NOTE — Progress Notes (Signed)
Patient came to pickup samples of Aimovig per v.o. Dr. Lucia Gaskins. She is aware of instructions. She asked for a Toradol shot and reported a migraine going on a few days with R (?) ear pain and some swelling on L side of her eye (says the swelling is typical for her some of her migraines). She was given a Toradol 60 mg IM shot per v.o. Dr. Lucia Gaskins. Patient told to call/mychart message if needed and proceed to ED later if needed if Toradol ineffective. Pt verbalized appreciation.

## 2018-12-21 NOTE — Telephone Encounter (Signed)
Noted thanks °

## 2018-12-26 NOTE — Telephone Encounter (Signed)
Tried to call patient. Received no answer and mailbox was full.

## 2018-12-27 ENCOUNTER — Other Ambulatory Visit: Payer: Self-pay | Admitting: *Deleted

## 2018-12-27 MED ORDER — METHYLPREDNISOLONE 4 MG PO TBPK: ORAL_TABLET | ORAL | 0 refills | Status: DC

## 2018-12-27 MED ORDER — RIZATRIPTAN BENZOATE 10 MG PO TABS: 10.0000 mg | ORAL_TABLET | ORAL | 11 refills | Status: DC | PRN

## 2018-12-27 NOTE — Telephone Encounter (Signed)
I called the patient. She reports missing her Aimovig for 2 months and started back last Friday. She reports waking up fine in the mornings and then around 10 AM when she gets moving pressure starts in her skull base and neck and is worse as the day progresses. She is able to treat the migraine only at night with her prescription of Tizanidine because of drowsy s/e. OTC medications do not work. She is nauseated. She reports this pressure as usual for her migraines however now she reports a ringing pain in her L ear. Patient is agreeable to try a steroid dose pack. She stated that Rizatriptan has worked for her in the past too. Imitrex makes her headache worse.   I spoke with Dr. Lucia Gaskins who gave v.o. for Rizatriptan 10 mg PO PRN with 11 refills and a steroid dose pack with 0 refills.   I spoke with the patient, reviewed instructions and common side effects with the patient for both the Rizatriptan and the steroid dose pack. Advised pt to check inserts with each med for full list of side effects. Patient aware that she can take each day's medrol pills each morning with food and follow the pack instructions for tapering dose over the week. Her questions were answered. She was advised to call back if not better or call PCP if ear pain does not improve with this. Pt verbalized appreciation and understanding.

## 2019-01-31 ENCOUNTER — Other Ambulatory Visit: Payer: Self-pay | Admitting: Neurology

## 2019-01-31 MED ORDER — METOCLOPRAMIDE HCL 10 MG PO TABS: 10.0000 mg | ORAL_TABLET | Freq: Three times a day (TID) | ORAL | 6 refills | Status: DC | PRN

## 2019-02-05 ENCOUNTER — Telehealth: Payer: Self-pay | Admitting: Neurology

## 2019-02-05 NOTE — Telephone Encounter (Addendum)
I spoke with the patient. She stated that she has been taking the benadryl, Tylenol and Reglan. She is ok until she gets up and then she has throbbing and nausea. She said her L eye feels like there is pressure on it. Hard to open and close. She finished her Abx and feels better. I offered her a Toradol shot from nurse today for her migraine. She stated she is going to see her PCP today to get the back of her head checked. She can't even brush her hair and if she touches her head it stings. I offered a f/u appt with Amy NP since it's been 6 months since she was seen. Pt requested Monday. I scheduled her for 02/11/2019 @ 08:30 arrival 15-30 minutes prior. She will let us know if she has any further needs until then. Pt reports she does have insurance.   Dr. Lucia Gaskins updated and agrees with plan.

## 2019-02-05 NOTE — Telephone Encounter (Signed)
Patient called in she stated she is having a migraine but worse than she normally has them , she stated there is a spot on the back of her head that is sore to the touch and she is also having pressure behind her eye. Advised patient is she is having new or worsening symptoms is best that she goes to the ED.

## 2019-02-06 ENCOUNTER — Telehealth: Payer: Self-pay | Admitting: Neurology

## 2019-02-06 NOTE — Telephone Encounter (Signed)
Completed PA on [employer]. KEY: [id number]. Awaiting determination from [employer].

## 2019-02-06 NOTE — Telephone Encounter (Signed)
[name] from [employer] called needing prior auth for pts Aimovig. Ref# [id number] Please advise.

## 2019-02-07 NOTE — Progress Notes (Signed)
PATIENT: [name] Sanchez DOB: [birth year]  REASON FOR VISIT: follow up HISTORY FROM: patient  Chief Complaint  Patient presents with  . Follow-up    Migraine follow up. Alone. Rm 9. Patient mentioned that she feels like she has some pressure build in her left eye for the last 2-3 weeks, pain gets to the point where she vomits. No migraine at present but if she does too much she can end with a migraine.      HISTORY OF PRESENT ILLNESS: Today 02/11/19 [name] Sanchez is a 23 y.o. female here today for follow up on migraines. She was doing well on Amovig and unfortunately had a lapse in insurance causing her to be without medication for about 3 months. She just recently got Amovig approved again. She has had more frequent and severe migraines. Unusally unilateral with radiation to the back of her head. She is very nauseous and sensitive to light and sounds. She was recently given metoclopramide that has helped some.  She complains of a pressure sensation behind the left eye that radiates to the back of her head. She has throbbing with nausea and vomiting. Light and sound make it worse. Sleep makes it better. She is currently being treated for a sinus infection. She has been on a total of three antibiotics. She does not feel that the antibiotics are helping. She is taking metoclopramide occasionally. Last dose was last night. She has not taken Maxalt in about 2 weeks. She took her last dose of Zofran last night.   She had an MRI ordered in September. She has not had this done yet.   Last eye exam July 2019.    HISTORY: (copied from Dr Trevor Mace note on 08/08/2018) HPI:  [name] Sanchez is a 23 y.o. female here as requested by Dr. Maryfrances Bunnell for chronic migraines.  Past medical history of migraine, depression, anxiety. Migraines since a child. No Fhx of migraies that she knows of. Migraines really started in HS. Seen by Neuro Dr. Sherryll Burger and here Dr. Neale Burly in [place] and tried nerve blocks, topamax,  trigger point injections, sphenopalatine ganglion block. Triptans made it worse. Also tried Amitriptyline. She had an amazing response to Aimovig will reorder.  Daily headaches with superimposed migraines 15 migraine days a month, severe, worsening, last 1-2 days, pounding/pulsating/throbbing, +light sensitivity/ noise sensitivity, nausea has vomited movement akes it worse. She has black and white spots of aura.    Reviewed notes, labs and imaging from outside physicians, which showed:  Reviewed notes from [facility].  Patient was first seen in August of this year as a new patient.  Patient reported migraine with aura estimated to have 2-3 headaches per week she was followed by [facility] but is coming to [place] because of health insurance coverage she is not currently on any migraine Rx.  She also has chronic anxiety and depression.  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  Exam was unremarkable.  Reviewed emergency room notes from August 03, 2018 for progressively worsening left-sided headache the same as previous migraines with associated nausea vomiting photophobia and phonophobia previously followed by a neurologist in [place].  Not on any preventative or abortive therapies.  She is been going to urgent care for IM Toradol and Phenergan when her headaches get bad.  She also fell down the stairs 1 week prior and hit her head with intermittent blurred vision and an increase in her migraine since the falls.  Reviewed CT scan  report 08/03/2018: IMPRESSION: Limited evaluation of the brain due to severe motion artifact and nondiagnostic study of the lower portion of the brain and base of the skull. No acute intracranial pathology identified in the upper portion of the brain. If there is high clinical concern for acute intracranial pathology, repeat CT is recommended.  REVIEW OF SYSTEMS: Out of a complete 14 system review of symptoms, the patient complains only of the following  symptoms, headache, nausea, light and sound sensitivity, allergies and all other reviewed systems are negative.  ALLERGIES: Allergies  Allergen Reactions  . Percocet [Oxycodone-Acetaminophen] Hives and Itching  . Cefuroxime Axetil Rash  . Penicillins Rash    HOME MEDICATIONS: Outpatient Medications Prior to Visit  Medication Sig Dispense Refill  . acetaminophen (TYLENOL 8 HOUR) 650 MG CR tablet Take 1 tablet (650 mg total) by mouth every 8 (eight) hours as needed. (Patient taking differently: Take 650 mg by mouth every 8 (eight) hours as needed for pain. ) 30 tablet 0  . ALPRAZolam (XANAX) 0.5 MG tablet Take 1 tablet (0.5 mg total) by mouth daily as needed for anxiety. 20 tablet 0  . clindamycin (CLEOCIN) 300 MG capsule Take 300 mg by mouth 3 (three) times daily. X 14 days    . Erenumab-aooe (AIMOVIG) 140 MG/ML SOAJ Inject 140 mg into the skin every 30 (thirty) days. 1 pen 11  . FLUoxetine (PROZAC) 40 MG capsule Take 1 capsule (40 mg total) by mouth daily. 90 capsule 3  . ibuprofen (ADVIL,MOTRIN) 600 MG tablet Take 1 tablet (600 mg total) by mouth every 6 (six) hours as needed. 30 tablet 1  . methylPREDNISolone (MEDROL DOSEPAK) 4 MG TBPK tablet Take as directed 21 tablet 0  . metoCLOPramide (REGLAN) 10 MG tablet Take 1 tablet (10 mg total) by mouth 3 (three) times daily as needed. For nausea or migraine. 30 tablet 6  . promethazine (PHENERGAN) 25 MG suppository Place 1 suppository (25 mg total) rectally every 6 (six) hours as needed for nausea or vomiting. 12 each 0  . promethazine (PHENERGAN) 25 MG tablet Take 25 mg by mouth every 6 (six) hours as needed for nausea or vomiting.    . rizatriptan (MAXALT) 10 MG tablet Take 1 tablet (10 mg total) by mouth as needed for migraine. May repeat in 2 hours if needed. Do exceed 2 tablets in 24 hours. 9 tablet 11  . tiZANidine (ZANAFLEX) 4 MG tablet Take 1 tablet (4 mg total) by mouth every 6 (six) hours as needed for muscle spasms. Or headaches. 30  tablet 0  . Erenumab-aooe (AIMOVIG) 140 MG/ML SOAJ Inject 140 mg into the skin every 30 (thirty) days. 3 pen 0  . ondansetron (ZOFRAN) 4 MG tablet Take 4 mg by mouth every 8 (eight) hours as needed for nausea or vomiting.   0   No facility-administered medications prior to visit.     PAST MEDICAL HISTORY: Past Medical History:  Diagnosis Date  . Anxiety   . Complication of anesthesia    wakes up crying  . Depression   . Endometriosis   . Family history of adverse reaction to anesthesia    hard to wake up, low heart rate  . Gestational hypertension 01/2012   also had postpartum preelampsia  . History of stomach ulcers   . Hx of ovarian cyst    x 2-3  . Hx of varicella   . IBS (irritable bowel syndrome)   . Migraine    chronic  . Panic attack   .  Pinched nerve    left arm  . Plica of knee 08/2012   medial plica left knee    PAST SURGICAL HISTORY: Past Surgical History:  Procedure Laterality Date  . KNEE ARTHROSCOPY  08/17/2012   Procedure: ARTHROSCOPY KNEE;  Surgeon: Harvie Junior, MD;  Location: [facility];  Service: Orthopedics;  Laterality: Left;  left knee scope with tight lateral retinacular release and plica removal  . TYMPANOSTOMY TUBE PLACEMENT Bilateral 1998  . VAGINAL HYSTERECTOMY Bilateral 08/14/2018   Procedure: HYSTERECTOMY VAGINAL WITH SALPINGECTOMY;  Surgeon: Reva Bores, MD;  Location: [facility];  Service: Gynecology;  Laterality: Bilateral;    FAMILY HISTORY: Family History  Problem Relation Age of Onset  . Hypertension Mother   . Heart disease Mother        MI  . Anesthesia problems Mother        post-op N/V  . Depression Mother   . Anxiety disorder Mother   . Obesity Mother   . Congestive Heart Failure Mother   . Asthma Sister   . Bipolar disorder Sister   . Asthma Brother   . Diabetes Maternal Aunt   . Heart disease Father   . Bipolar disorder Father   . Hyperlipidemia Father   . Uterine cancer Maternal Grandmother   .  Pancreatic cancer Maternal Grandmother   . Lung cancer Maternal Grandfather     SOCIAL HISTORY: Social History   Socioeconomic History  . Marital status: Married    Spouse name: Will  . Number of children: 2  . Years of education: Not on file  . Highest education level: Some college, no degree  Occupational History  . Occupation: Furniture conservator/restorer: [employer]  Social Needs  . Financial resource strain: Not on file  . Food insecurity:    Worry: Not on file    Inability: Not on file  . Transportation needs:    Medical: Not on file    Non-medical: Not on file  Tobacco Use  . Smoking status: Never Smoker  . Smokeless tobacco: Never Used  Substance and Sexual Activity  . Alcohol use: Not Currently    Alcohol/week: 0.0 standard drinks    Comment: occassionally  . Drug use: No  . Sexual activity: Yes    Partners: Male    Birth control/protection: None, Surgical    Comment: last night  Lifestyle  . Physical activity:    Days per week: Not on file    Minutes per session: Not on file  . Stress: Not on file  Relationships  . Social connections:    Talks on phone: Not on file    Gets together: Not on file    Attends religious service: Not on file    Active member of club or organization: Not on file    Attends meetings of clubs or organizations: Not on file    Relationship status: Not on file  . Intimate partner violence:    Fear of current or ex partner: Not on file    Emotionally abused: Not on file    Physically abused: Not on file    Forced sexual activity: Not on file  Other Topics Concern  . Not on file  Social History Narrative   Lives at home with her husband and children   Right handed   Caffeine 3 cups daily      PHYSICAL EXAM  Vitals:   02/11/19 0823  BP: 120/83  Pulse: 87  Weight:  217 lb 6.4 oz (98.6 kg)  Height: 5\' 4"  (1.626 m)   Body mass index is 37.32 kg/m.  Generalized: Well developed, in no acute distress  Cardiology: normal  rate and rhythm, no murmur noted Neurological examination  Mentation: Alert oriented to time, place, history taking. Follows all commands speech and language fluent Cranial nerve II-XII: Pupils were equal round reactive to light. Extraocular movements were full, visual field were full on confrontational test. Facial sensation and strength were normal. Uvula tongue midline. Head turning and shoulder shrug  were normal and symmetric. Motor: The motor testing reveals 5 over 5 strength of all 4 extremities. Good symmetric motor tone is noted throughout.  Sensory: Sensory testing is intact to soft touch on all 4 extremities. No evidence of extinction is noted.  Coordination: Cerebellar testing reveals good finger-nose-finger and heel-to-shin bilaterally.  Gait and station: Gait is normal.     DIAGNOSTIC DATA (LABS, IMAGING, TESTING) - I reviewed patient records, labs, notes, testing and imaging myself where available.  No flowsheet data found.   Lab Results  Component Value Date   WBC 8.7 08/18/2018   HGB 13.4 08/18/2018   HCT 40.4 08/18/2018   MCV 92.9 08/18/2018   PLT 270 08/18/2018      Component Value Date/Time   NA 137 08/18/2018 2032   NA 139 05/24/2017 1027   K 4.1 08/18/2018 2032   CL 101 08/18/2018 2032   CO2 28 08/18/2018 2032   GLUCOSE 110 (H) 08/18/2018 2032   BUN 13 08/18/2018 2032   BUN 14 05/24/2017 1027   CREATININE 0.67 08/18/2018 2032   CALCIUM 8.9 08/18/2018 2032   PROT 7.0 08/18/2018 2032   PROT 7.1 05/24/2017 1027   ALBUMIN 3.8 08/18/2018 2032   ALBUMIN 4.2 05/24/2017 1027   AST 18 08/18/2018 2032   ALT 19 08/18/2018 2032   ALKPHOS 55 08/18/2018 2032   BILITOT 0.5 08/18/2018 2032   BILITOT 0.4 05/24/2017 1027   GFRNONAA >60 08/18/2018 2032   GFRAA >60 08/18/2018 2032   Lab Results  Component Value Date   CHOL 168 05/24/2017   HDL 59 05/24/2017   LDLCALC 94 05/24/2017   TRIG 76 05/24/2017   CHOLHDL 3 04/04/2012   Lab Results  Component Value Date    HGBA1C 5.1 05/24/2017   Lab Results  Component Value Date   VITAMINB12 389 05/24/2017   Lab Results  Component Value Date   TSH 1.790 05/24/2017       ASSESSMENT AND PLAN 23 y.o. year old female  has a past medical history of Anxiety, Complication of anesthesia, Depression, Endometriosis, Family history of adverse reaction to anesthesia, Gestational hypertension (01/2012), History of stomach ulcers, ovarian cyst, varicella, IBS (irritable bowel syndrome), Migraine, Panic attack, Pinched nerve, and Plica of knee (08/2012). here with     ICD-10-CM   1. Chronic migraine without aura without status migrainosus, not intractable G43.709 ketorolac (TORADOL) 30 MG/ML injection 30 mg    ketorolac (TORADOL) injection 60 mg    She continues to suffer with regular migraines. She has new insurance and has been able to get back on Amovig. She is currently being treated for a sinus infection and has had a migraine for the past 2 weeks. She has not taken abortive therapy recently. We will treat with ketorolac 30mg  IM in the office today. I have instructed her to go home and take Maxalt, Reglan and Zofran with food. She will keep Reglan in her system three times daily for the  next week. May repeat Maxalt tomorrow if needed. We will get MRI set up for her. I have also advised Mucinex and Xyzal OTC for URI symptoms. She has follow up with her PCP today and will discuss need to continue abx. She will call us for worsening or unresolved symptoms. Follow up in 6 months.    No orders of the defined types were placed in this encounter.    Meds ordered this encounter  Medications  . ketorolac (TORADOL) 30 MG/ML injection 30 mg  . ondansetron (ZOFRAN) 4 MG tablet    Sig: Take 1 tablet (4 mg total) by mouth every 8 (eight) hours as needed for nausea or vomiting.    Dispense:  20 tablet    Refill:  1    Order Specific Question:   Supervising Provider    Answer:   Anson Fret [id number]  . ketorolac  (TORADOL) injection 60 mg          Shawnie Dapper, FNP-C 02/11/2019, 9:07 AM [facility] [address] [place], [place] [address] [phone]

## 2019-02-08 NOTE — Progress Notes (Signed)
Subjective:    Patient ID: [name] Sanchez, female    DOB: 1996/11/06, 23 y.o.   MRN: [id number]  HPI:09/19/18 OV:   [name] is here for CPE She underwent hysterectomy 08/15/18- she has had 2 week f/u and will have final follow-up Oct 29,2019. She cannot lift >10 lbs and is off of work on short term disability. Her sister recently returned home and she reports the change of season and decrease of tome of sunlight has dramatically increased her depression. She reports sleeping more than usual, having little energy, and poor appetite the last 3 weeks. She denies suicidal thoughts/ideations. She denies ever being dx'd with Seasonal Affective Disorder (SAD) She is open to CBT referral She reports working on "adult [employer]" and spending time with her children 9 (ages 89,6) that provide her happiness. She reports that she and her husband are in "the happiest we have been in 10 years". She denies tobacco/ETOH use She reports urinary frequency the last 2 weeks, estimates to only be drinking 40 oz water/day  11/12/18 OV: [name] presents with acute anxiety and increase in panic attacks with chest heaviness and dyspnea during onset of attack. She estimates to have had 2 or 3 attacks in the last 2 weeks. She was fired from her job 2 weeks ago and her father in law had emergency hand surgery on Thanksgiving. She reports that her husband has been "ill-tempered" due to lack of sleep since "he has been working town jobs and I out of my job". She denies thoughts of harming herself/others She reports "getting really annoyed with my children and husband and being around a lot of people". Referral to Behavioral Health was placed 09/2018, office called to schedule new pt appt, she never make appt and states 'I just don't want to talk to someone else". She also reports "feeling weak and I seem to just keep falling" She is followed by [facility] and most recent CT results are below-  08/03/18 CT HEAD  WO- IMPRESSION: Limited evaluation of the brain due to severe motion artifact and nondiagnostic study of the lower portion of the brain and base of the skull. No acute intracranial pathology identified in the upper portion of the brain. If there is high clinical concern for acute intracranial pathology, repeat CT is recommended.  02/11/2019 OV: [name] is here for f/u: Depression and Anxiety She has been experiencing migraine HA >3 weeks, followed closely by Neurology/Dr. Lucia Gaskins Hopefully will have MRI completed soon She has had re-current bronchitis and otitis media- on third round of ABX from UC provider She reports stable mood, denies SI/HI- currently on Fluoxetine -has been on this dose for years She reports anxiety when leaving home for work and running errands, she states "I feel better just staying at home" She currently works at [employer] as a Scientist, physiological and reports her salary doesn't cover expenses of childcare for her two sons, ages 6 and 4 Her husband is supportive of her remaining home to care for their children and financially it would benefit their family. She states that her youngest son is "quite a handful and his behavior spikes my anxiety" She has never worked with a Paramedic and is agreeable to referral to psychology. She denies tobacco/ETOH use She has been biking and walking, and reports going down a pant size- great! She reports using Alprazolam sparingly- "maybe 1-2 tablets a month".  Patient Care Team    Relationship Specialty Notifications Start End  [place], [place] D,  NP PCP - General Family Medicine  07/17/18   Reva Bores, MD Consulting Physician Obstetrics and Gynecology  07/17/18   Ginette Otto, [facility]    09/28/18     Patient Active Problem List   Diagnosis Date Noted  . BMI 37.0-37.9, adult 02/11/2019  . Swelling of lymph node 10/02/2018  . Seasonal affective disorder (HCC) 09/19/2018  . Urinary frequency 09/19/2018  .  Status post hysterectomy 08/14/2018  . Acute post-traumatic headache, not intractable 07/30/2018  . Pelvic pain 07/31/2017  . Chronic migraine without aura without status migrainosus, not intractable 05/06/2016  . Depression with anxiety 04/04/2012  . Healthcare maintenance 04/01/2012  . Migraine with aura      Past Medical History:  Diagnosis Date  . Anxiety   . Complication of anesthesia    wakes up crying  . Depression   . Endometriosis   . Family history of adverse reaction to anesthesia    hard to wake up, low heart rate  . Gestational hypertension 01/2012   also had postpartum preelampsia  . History of stomach ulcers   . Hx of ovarian cyst    x 2-3  . Hx of varicella   . IBS (irritable bowel syndrome)   . Migraine    chronic  . Panic attack   . Pinched nerve    left arm  . Plica of knee 08/2012   medial plica left knee     Past Surgical History:  Procedure Laterality Date  . KNEE ARTHROSCOPY  08/17/2012   Procedure: ARTHROSCOPY KNEE;  Surgeon: Harvie Junior, MD;  Location: [facility];  Service: Orthopedics;  Laterality: Left;  left knee scope with tight lateral retinacular release and plica removal  . TYMPANOSTOMY TUBE PLACEMENT Bilateral 1998  . VAGINAL HYSTERECTOMY Bilateral 08/14/2018   Procedure: HYSTERECTOMY VAGINAL WITH SALPINGECTOMY;  Surgeon: Reva Bores, MD;  Location: [facility];  Service: Gynecology;  Laterality: Bilateral;     Family History  Problem Relation Age of Onset  . Hypertension Mother   . Heart disease Mother        MI  . Anesthesia problems Mother        post-op N/V  . Depression Mother   . Anxiety disorder Mother   . Obesity Mother   . Congestive Heart Failure Mother   . Asthma Sister   . Bipolar disorder Sister   . Asthma Brother   . Diabetes Maternal Aunt   . Heart disease Father   . Bipolar disorder Father   . Hyperlipidemia Father   . Uterine cancer Maternal Grandmother   . Pancreatic cancer Maternal  Grandmother   . Lung cancer Maternal Grandfather      Social History   Substance and Sexual Activity  Drug Use No     Social History   Substance and Sexual Activity  Alcohol Use Not Currently  . Alcohol/week: 0.0 standard drinks   Comment: occassionally     Social History   Tobacco Use  Smoking Status Never Smoker  Smokeless Tobacco Never Used     Outpatient Encounter Medications as of 02/11/2019  Medication Sig  . acetaminophen (TYLENOL 8 HOUR) 650 MG CR tablet Take 1 tablet (650 mg total) by mouth every 8 (eight) hours as needed. (Patient taking differently: Take 650 mg by mouth every 8 (eight) hours as needed for pain. )  . ALPRAZolam (XANAX) 0.5 MG tablet Take 1 tablet (0.5 mg total) by mouth daily as needed for anxiety.  Marland Kitchen  clindamycin (CLEOCIN) 300 MG capsule Take 300 mg by mouth 3 (three) times daily. X 14 days  . Erenumab-aooe (AIMOVIG) 140 MG/ML SOAJ Inject 140 mg into the skin every 30 (thirty) days.  Marland Kitchen FLUoxetine (PROZAC) 40 MG capsule Take 1 capsule (40 mg total) by mouth daily.  Marland Kitchen ibuprofen (ADVIL,MOTRIN) 600 MG tablet Take 1 tablet (600 mg total) by mouth every 6 (six) hours as needed.  . metoCLOPramide (REGLAN) 10 MG tablet Take 1 tablet (10 mg total) by mouth 3 (three) times daily as needed. For nausea or migraine.  . ondansetron (ZOFRAN) 4 MG tablet Take 1 tablet (4 mg total) by mouth every 8 (eight) hours as needed for nausea or vomiting.  . promethazine (PHENERGAN) 25 MG suppository Place 1 suppository (25 mg total) rectally every 6 (six) hours as needed for nausea or vomiting.  . promethazine (PHENERGAN) 25 MG tablet Take 25 mg by mouth every 6 (six) hours as needed for nausea or vomiting.  . rizatriptan (MAXALT) 10 MG tablet Take 1 tablet (10 mg total) by mouth as needed for migraine. May repeat in 2 hours if needed. Do exceed 2 tablets in 24 hours.  Marland Kitchen tiZANidine (ZANAFLEX) 4 MG tablet Take 1 tablet (4 mg total) by mouth every 6 (six) hours as needed for  muscle spasms. Or headaches.  . [DISCONTINUED] FLUoxetine (PROZAC) 40 MG capsule Take 1 capsule (40 mg total) by mouth daily.  . [DISCONTINUED] ondansetron (ZOFRAN) 4 MG tablet Take 1 tablet (4 mg total) by mouth every 8 (eight) hours as needed for nausea or vomiting.  . [DISCONTINUED] Erenumab-aooe (AIMOVIG) 140 MG/ML SOAJ Inject 140 mg into the skin every 30 (thirty) days.  . [DISCONTINUED] methylPREDNISolone (MEDROL DOSEPAK) 4 MG TBPK tablet Take as directed  . [DISCONTINUED] ondansetron (ZOFRAN) 4 MG tablet Take 4 mg by mouth every 8 (eight) hours as needed for nausea or vomiting.    Facility-Administered Encounter Medications as of 02/11/2019  Medication  . ketorolac (TORADOL) 30 MG/ML injection 30 mg    Allergies: Percocet [oxycodone-acetaminophen]; Cefuroxime axetil; and Penicillins  Body mass index is 37.51 kg/m.  Blood pressure 109/71, pulse 83, temperature 98.3 F (36.8 C), temperature source Oral, height 5\' 4"  (1.626 m), weight 218 lb 8 oz (99.1 kg), last menstrual period 07/15/2018, SpO2 98 %.  Review of Systems  Constitutional: Positive for fatigue. Negative for activity change, appetite change, chills, diaphoresis, fever and unexpected weight change.  HENT: Negative for congestion.   Eyes: Negative for visual disturbance.  Respiratory: Negative for cough, chest tightness, shortness of breath, wheezing and stridor.   Cardiovascular: Negative for chest pain, palpitations and leg swelling.  Gastrointestinal: Negative for abdominal distention, abdominal pain, blood in stool, constipation, diarrhea, nausea and vomiting.  Endocrine: Negative for cold intolerance, heat intolerance, polydipsia, polyphagia and polyuria.  Musculoskeletal: Negative for arthralgias, back pain, gait problem, joint swelling, myalgias, neck pain and neck stiffness.  Skin: Negative for color change, pallor, rash and wound.  Neurological: Positive for weakness and headaches. Negative for dizziness.   Hematological: Does not bruise/bleed easily.  Psychiatric/Behavioral: Positive for dysphoric mood and sleep disturbance. Negative for behavioral problems, confusion, decreased concentration, hallucinations, self-injury and suicidal ideas. The patient is nervous/anxious. The patient is not hyperactive.        Objective:   Physical Exam Vitals signs and nursing note reviewed.  Constitutional:      General: She is not in acute distress.    Appearance: She is well-developed. She is not diaphoretic.  HENT:  Head: Normocephalic and atraumatic.     Right Ear: External ear normal. No decreased hearing noted.     Left Ear: External ear normal. No decreased hearing noted.     Nose: Nose normal.  Eyes:     Conjunctiva/sclera: Conjunctivae normal.     Pupils: Pupils are equal, round, and reactive to light.  Cardiovascular:     Rate and Rhythm: Normal rate and regular rhythm.     Heart sounds: Normal heart sounds.  Pulmonary:     Effort: Pulmonary effort is normal. No respiratory distress.     Breath sounds: Normal breath sounds. No stridor. No wheezing or rales.  Chest:     Chest wall: No tenderness.  Skin:    General: Skin is warm and dry.     Capillary Refill: Capillary refill takes less than 2 seconds.     Coloration: Skin is not pale.     Findings: No erythema or rash.  Neurological:     Mental Status: She is alert and oriented to person, place, and time.     Coordination: Coordination normal.  Psychiatric:        Mood and Affect: Mood is anxious and depressed.        Speech: Speech normal.        Behavior: Behavior normal.        Thought Content: Thought content normal.        Judgment: Judgment normal.     Comments: Well groomed, able to smile and laugh during OV       Assessment & Plan:   1. Depression with anxiety   2. Healthcare maintenance   3. BMI 37.0-37.9, adult     Depression with anxiety Continue all medications as directed. Refill of Fluoxetine sent  in. When you need refill on Alprazolam needed, please call clinic- continue to use very sparingly. Continue to drink plenty of water, follow heart healthy diet, and remain as active as possible. Referral to Mental Health placed.   Healthcare maintenance Continue with Neurology as directed. Follow-up 7 months complete physical, fasting lab app the week prior.  BMI 37.0-37.9, adult Body mass index is 37.51 kg/m.  Current wt 218 Continue biking and walking Follow Mediterranean diet and remain well hydrated     FOLLOW-UP:  Return in about 7 months (around 09/13/2019) for CPE, Fasting Labs.

## 2019-02-11 ENCOUNTER — Ambulatory Visit (INDEPENDENT_AMBULATORY_CARE_PROVIDER_SITE_OTHER): Payer: 59 | Admitting: Family Medicine

## 2019-02-11 ENCOUNTER — Encounter: Payer: Self-pay | Admitting: Neurology

## 2019-02-11 ENCOUNTER — Encounter: Payer: Self-pay | Admitting: Adult Health

## 2019-02-11 ENCOUNTER — Encounter: Payer: Self-pay | Admitting: Family Medicine

## 2019-02-11 ENCOUNTER — Telehealth: Payer: Self-pay | Admitting: Family Medicine

## 2019-02-11 ENCOUNTER — Ambulatory Visit (INDEPENDENT_AMBULATORY_CARE_PROVIDER_SITE_OTHER): Payer: 59 | Admitting: Adult Health

## 2019-02-11 VITALS — BP 109/71 | HR 83 | Temp 98.3°F | Ht 64.0 in | Wt 218.5 lb

## 2019-02-11 VITALS — BP 120/83 | HR 87 | Ht 64.0 in | Wt 217.4 lb

## 2019-02-11 DIAGNOSIS — Z0000 Encounter for general adult medical examination without abnormal findings: Secondary | ICD-10-CM | POA: Diagnosis not present

## 2019-02-11 DIAGNOSIS — G43709 Chronic migraine without aura, not intractable, without status migrainosus: Secondary | ICD-10-CM | POA: Diagnosis not present

## 2019-02-11 DIAGNOSIS — E669 Obesity, unspecified: Secondary | ICD-10-CM | POA: Insufficient documentation

## 2019-02-11 DIAGNOSIS — F418 Other specified anxiety disorders: Secondary | ICD-10-CM

## 2019-02-11 DIAGNOSIS — Z6837 Body mass index (BMI) 37.0-37.9, adult: Secondary | ICD-10-CM

## 2019-02-11 MED ORDER — ONDANSETRON HCL 4 MG PO TABS: 4.0000 mg | ORAL_TABLET | Freq: Three times a day (TID) | ORAL | 1 refills | Status: DC | PRN

## 2019-02-11 MED ORDER — FLUOXETINE HCL 40 MG PO CAPS: 40.0000 mg | ORAL_CAPSULE | Freq: Every day | ORAL | 3 refills | Status: DC

## 2019-02-11 MED ORDER — KETOROLAC TROMETHAMINE 60 MG/2ML IM SOLN
60.0000 mg | Freq: Once | INTRAMUSCULAR | Status: AC
  Administered 2019-02-11: 60 mg via INTRAMUSCULAR

## 2019-02-11 MED ORDER — KETOROLAC TROMETHAMINE 30 MG/ML IJ SOLN: 30.0000 mg | Freq: Once | INTRAMUSCULAR | Status: AC

## 2019-02-11 NOTE — Telephone Encounter (Signed)
[name]/covermymeds [phone] Ref key# [id number] called to advise of letter info below. She also was going to start PA but she needed new ins info, per [place] she will start PA.  FYI

## 2019-02-11 NOTE — Patient Instructions (Signed)
Toradol in office today   Take Reglan three times daily for the next week, then wean off as directed   Maxalt today and Zofran if needed  Consider Mucinex and Xyzal for URI symptoms  Migraine Headache  A migraine headache is a very strong throbbing pain on one side or both sides of your head. Migraines can also cause other symptoms. Talk with your doctor about what things may bring on (trigger) your migraine headaches. Follow these instructions at home: Medicines  Take over-the-counter and prescription medicines only as told by your doctor.  Do not drive or use heavy machinery while taking prescription pain medicine.  To prevent or treat constipation while you are taking prescription pain medicine, your doctor may recommend that you: ? Drink enough fluid to keep your pee (urine) clear or pale yellow. ? Take over-the-counter or prescription medicines. ? Eat foods that are high in fiber. These include fresh fruits and vegetables, whole grains, and beans. ? Limit foods that are high in fat and processed sugars. These include fried and sweet foods. Lifestyle  Avoid alcohol.  Do not use any products that contain nicotine or tobacco, such as cigarettes and e-cigarettes. If you need help quitting, ask your doctor.  Get at least 8 hours of sleep every night.  Limit your stress. General instructions   Keep a journal to find out what may bring on your migraines. For example, write down: ? What you eat and drink. ? How much sleep you get. ? Any change in what you eat or drink. ? Any change in your medicines.  If you have a migraine: ? Avoid things that make your symptoms worse, such as bright lights. ? It may help to lie down in a dark, quiet room. ? Do not drive or use heavy machinery. ? Ask your doctor what activities are safe for you.  Keep all follow-up visits as told by your doctor. This is important. Contact a doctor if:  You get a migraine that is different or worse than  your usual migraines. Get help right away if:  Your migraine gets very bad.  You have a fever.  You have a stiff neck.  You have trouble seeing.  Your muscles feel weak or like you cannot control them.  You start to lose your balance a lot.  You start to have trouble walking.  You pass out (faint). This information is not intended to replace advice given to you by your health care provider. Make sure you discuss any questions you have with your health care provider. Document Released: 08/30/2008 Document Revised: 08/15/2018 Document Reviewed: 05/09/2016 Elsevier Interactive Patient Education  2019 [employer].

## 2019-02-11 NOTE — Assessment & Plan Note (Signed)
Body mass index is 37.51 kg/m.  Current wt 218 Continue biking and walking Follow Mediterranean diet and remain well hydrated

## 2019-02-11 NOTE — Assessment & Plan Note (Signed)
Continue with Neurology as directed. Follow-up 7 months complete physical, fasting lab app the week prior.

## 2019-02-11 NOTE — Telephone Encounter (Signed)
[employer] order sent to GI. They will obtain the auth and reach out to the pt to schedule.  °

## 2019-02-11 NOTE — Assessment & Plan Note (Addendum)
Continue all medications as directed. Refill of Fluoxetine sent in. When you need refill on Alprazolam needed, please call clinic- continue to use very sparingly. Continue to drink plenty of water, follow heart healthy diet, and remain as active as possible. Referral to Mental Health placed.

## 2019-02-11 NOTE — Patient Instructions (Addendum)
Major Depressive Disorder, Adult Major depressive disorder (MDD) is a mental health condition. MDD often makes you feel sad, hopeless, or helpless. MDD can also cause symptoms in your body. MDD can affect your:  Work.  School.  Relationships.  Other normal activities. MDD can range from mild to very bad. It may occur once (single episode MDD). It can also occur many times (recurrent MDD). The main symptoms of MDD often include:  Feeling sad, depressed, or irritable most of the time.  Loss of interest. MDD symptoms also include:  Sleeping too much or too little.  Eating too much or too little.  A change in your weight.  Feeling tired (fatigue) or having low energy.  Feeling worthless.  Feeling guilty.  Trouble making decisions.  Trouble thinking clearly.  Thoughts of suicide or harming others.  Feeling weak.  Feeling agitated.  Keeping yourself from being around other people (isolation). Follow these instructions at home: Activity  Do these things as told by your doctor: ? Go back to your normal activities. ? Exercise regularly. ? Spend time outdoors. Alcohol  Talk with your doctor about how alcohol can affect your antidepressant medicines.  Do not drink alcohol. Or, limit how much alcohol you drink. ? This means no more than 1 drink a day for nonpregnant women and 2 drinks a day for men. One drink equals one of these:  12 oz of beer.  5 oz of wine.  1 oz of hard liquor. General instructions  Take over-the-counter and prescription medicines only as told by your doctor.  Eat a healthy diet.  Get plenty of sleep.  Find activities that you enjoy. Make time to do them.  Think about joining a support group. Your doctor may be able to suggest a group for you.  Keep all follow-up visits as told by your doctor. This is important. Where to find more information:  [employer] on Mental Illness: ? www.nami.org  [place] [employer] of Mental  Health: ? http://www.maynard.net/  National Suicide Prevention Lifeline: ? 854-450-1767. This is free, 24-hour help. Contact a doctor if:  Your symptoms get worse.  You have new symptoms. Get help right away if:  You self-harm.  You see, hear, taste, smell, or feel things that are not present (hallucinate). If you ever feel like you may hurt yourself or others, or have thoughts about taking your own life, get help right away. You can go to your nearest emergency department or call:  Your local emergency services (911 in the [place]  A suicide crisis helpline, such as the National Suicide Prevention Lifeline: ? (973)394-9243. This is open 24 hours a day. This information is not intended to replace advice given to you by your health care provider. Make sure you discuss any questions you have with your health care provider. Document Released: 11/02/2015 Document Revised: 08/07/2016 Document Reviewed: 08/07/2016 Elsevier Interactive Patient Education  2019 Elsevier Inc.   Generalized Anxiety Disorder, Adult Generalized anxiety disorder (GAD) is a mental health disorder. People with this condition constantly worry about everyday events. Unlike normal anxiety, worry related to GAD is not triggered by a specific event. These worries also do not fade or get better with time. GAD interferes with life functions, including relationships, work, and school. GAD can vary from mild to severe. People with severe GAD can have intense waves of anxiety with physical symptoms (panic attacks). What are the causes? The exact cause of GAD is not known. What increases the risk? This condition is more  likely to develop in:  Women.  People who have a family history of anxiety disorders.  People who are very shy.  People who experience very stressful life events, such as the death of a loved one.  People who have a very stressful family environment. What are the signs or symptoms? People with GAD often  worry excessively about many things in their lives, such as their health and family. They may also be overly concerned about:  Doing well at work.  Being on time.  Natural disasters.  Friendships. Physical symptoms of GAD include:  Fatigue.  Muscle tension or having muscle twitches.  Trembling or feeling shaky.  Being easily startled.  Feeling like your heart is pounding or racing.  Feeling out of breath or like you cannot take a deep breath.  Having trouble falling asleep or staying asleep.  Sweating.  Nausea, diarrhea, or irritable bowel syndrome (IBS).  Headaches.  Trouble concentrating or remembering facts.  Restlessness.  Irritability. How is this diagnosed? Your health care provider can diagnose GAD based on your symptoms and medical history. You will also have a physical exam. The health care provider will ask specific questions about your symptoms, including how severe they are, when they started, and if they come and go. Your health care provider may ask you about your use of alcohol or drugs, including prescription medicines. Your health care provider may refer you to a mental health specialist for further evaluation. Your health care provider will do a thorough examination and may perform additional tests to rule out other possible causes of your symptoms. To be diagnosed with GAD, a person must have anxiety that:  Is out of his or her control.  Affects several different aspects of his or her life, such as work and relationships.  Causes distress that makes him or her unable to take part in normal activities.  Includes at least three physical symptoms of GAD, such as restlessness, fatigue, trouble concentrating, irritability, muscle tension, or sleep problems. Before your health care provider can confirm a diagnosis of GAD, these symptoms must be present more days than they are not, and they must last for six months or longer. How is this treated? The  following therapies are usually used to treat GAD:  Medicine. Antidepressant medicine is usually prescribed for long-term daily control. Antianxiety medicines may be added in severe cases, especially when panic attacks occur.  Talk therapy (psychotherapy). Certain types of talk therapy can be helpful in treating GAD by providing support, education, and guidance. Options include: ? Cognitive behavioral therapy (CBT). People learn coping skills and techniques to ease their anxiety. They learn to identify unrealistic or negative thoughts and behaviors and to replace them with positive ones. ? Acceptance and commitment therapy (ACT). This treatment teaches people how to be mindful as a way to cope with unwanted thoughts and feelings. ? Biofeedback. This process trains you to manage your body's response (physiological response) through breathing techniques and relaxation methods. You will work with a therapist while machines are used to monitor your physical symptoms.  Stress management techniques. These include yoga, meditation, and exercise. A mental health specialist can help determine which treatment is best for you. Some people see improvement with one type of therapy. However, other people require a combination of therapies. Follow these instructions at home:  Take over-the-counter and prescription medicines only as told by your health care provider.  Try to maintain a normal routine.  Try to anticipate stressful situations and allow extra  time to manage them.  Practice any stress management or self-calming techniques as taught by your health care provider.  Do not punish yourself for setbacks or for not making progress.  Try to recognize your accomplishments, even if they are small.  Keep all follow-up visits as told by your health care provider. This is important. Contact a health care provider if:  Your symptoms do not get better.  Your symptoms get worse.  You have signs of  depression, such as: ? A persistently sad, cranky, or irritable mood. ? Loss of enjoyment in activities that used to bring you joy. ? Change in weight or eating. ? Changes in sleeping habits. ? Avoiding friends or family members. ? Loss of energy for normal tasks. ? Feelings of guilt or worthlessness. Get help right away if:  You have serious thoughts about hurting yourself or others. If you ever feel like you may hurt yourself or others, or have thoughts about taking your own life, get help right away. You can go to your nearest emergency department or call:  Your local emergency services (911 in the [place]  A suicide crisis helpline, such as the National Suicide Prevention Lifeline at 231-340-65181-3138292273. This is open 24 hours a day. Summary  Generalized anxiety disorder (GAD) is a mental health disorder that involves worry that is not triggered by a specific event.  People with GAD often worry excessively about many things in their lives, such as their health and family.  GAD may cause physical symptoms such as restlessness, trouble concentrating, sleep problems, frequent sweating, nausea, diarrhea, headaches, and trembling or muscle twitching.  A mental health specialist can help determine which treatment is best for you. Some people see improvement with one type of therapy. However, other people require a combination of therapies. This information is not intended to replace advice given to you by your health care provider. Make sure you discuss any questions you have with your health care provider. Document Released: 03/18/2013 Document Revised: 10/11/2016 Document Reviewed: 10/11/2016 Elsevier Interactive Patient Education  2019 [employer]  Continue all medications as directed. Refill of Fluoxetine sent in. When you need refill on Alprazolam needed, please call clinic- continue to use very sparingly. Continue to drink plenty of water, follow heart healthy diet, and remain as  active as possible. Referral to Mental Health placed. Continue with Neurology as directed. Follow-up 7 months complete physical, fasting lab app the week prior. NICE TO SEE YOU!

## 2019-02-11 NOTE — Progress Notes (Signed)
Made any corrections needed, and agree with history, physical, neuro exam,assessment and plan as stated.     Antonia Ahern, MD [facility]  

## 2019-02-11 NOTE — Telephone Encounter (Signed)
Per [employer], pt no longer has coverage for pharmacy benefit through [employer].   Also note pt has brought her new insurance card to the office today.

## 2019-02-11 NOTE — Telephone Encounter (Addendum)
Aimovig 140 mg PA completed. KEY: [id number]. Awaiting [facility] determination.  "If [employer] has not responded to your request within 24 hours, contact [employer] at [phone]"

## 2019-02-12 NOTE — Telephone Encounter (Signed)
Aimovig 140 mg has been approved by [facility] from 02/11/2019-02/11/2020 per Cover my meds & fax received.   Sent pt [employer].

## 2019-02-15 ENCOUNTER — Encounter: Payer: Self-pay | Admitting: Adult Health

## 2019-02-20 ENCOUNTER — Other Ambulatory Visit: Payer: Self-pay

## 2019-02-20 ENCOUNTER — Ambulatory Visit
Admission: RE | Admit: 2019-02-20 | Discharge: 2019-02-20 | Disposition: A | Discharge status: Home / Self Care | Payer: 59 | Source: Ambulatory Visit | Attending: Neurology | Admitting: Neurology

## 2019-02-20 DIAGNOSIS — R51 Headache with orthostatic component, not elsewhere classified: Secondary | ICD-10-CM

## 2019-02-20 DIAGNOSIS — R519 Headache, unspecified: Secondary | ICD-10-CM

## 2019-02-20 DIAGNOSIS — G8929 Other chronic pain: Secondary | ICD-10-CM

## 2019-02-20 DIAGNOSIS — H539 Unspecified visual disturbance: Secondary | ICD-10-CM

## 2019-02-20 MED ORDER — GADOBENATE DIMEGLUMINE 529 MG/ML IV SOLN
20.0000 mL | Freq: Once | INTRAVENOUS | Status: AC | PRN
  Administered 2019-02-20: 20 mL via INTRAVENOUS

## 2019-05-07 MED FILL — AIMOVIG 140 MG/ML SOAJ: 140 | 30 days supply | Qty: 1 | Fill #3

## 2019-06-03 MED FILL — AIMOVIG 140 MG/ML SOAJ: 140 | 30 days supply | Qty: 1 | Fill #4

## 2019-07-11 MED FILL — AIMOVIG 140 MG/ML SOAJ: 140 | 30 days supply | Qty: 1 | Fill #5

## 2019-07-25 ENCOUNTER — Telehealth: Payer: Self-pay | Admitting: *Deleted

## 2019-07-25 NOTE — Telephone Encounter (Signed)
Pt has a 9/9 appt with Dr. Jaynee Eagles that was scheduled last year. Amy NP last saw pt in March and advised a 6 month f/u. I called the pt and LVM (ok per DPR) offering to switch this appt to Amy NP for the follow-up. I advised she has several openings between now and 9/9 and we would be glad to make that change for her if she agrees. Left office number in message.

## 2019-08-14 ENCOUNTER — Ambulatory Visit: Payer: Self-pay | Admitting: Neurology

## 2019-08-29 ENCOUNTER — Other Ambulatory Visit: Payer: Self-pay

## 2019-08-29 ENCOUNTER — Telehealth: Payer: Self-pay | Admitting: Family Medicine

## 2019-08-29 ENCOUNTER — Ambulatory Visit (INDEPENDENT_AMBULATORY_CARE_PROVIDER_SITE_OTHER): Payer: 59 | Admitting: *Deleted

## 2019-08-29 DIAGNOSIS — G43709 Chronic migraine without aura, not intractable, without status migrainosus: Secondary | ICD-10-CM | POA: Diagnosis not present

## 2019-08-29 MED ORDER — KETOROLAC TROMETHAMINE 60 MG/2ML IM SOLN
60.0000 mg | Freq: Once | INTRAMUSCULAR | Status: AC
  Administered 2019-08-29: 30 mg via INTRAMUSCULAR

## 2019-08-29 NOTE — Telephone Encounter (Signed)
Pt is requesting an injection for a migraine that she has had  since Tuesday, pt would like to come into the office today for one.  Please call

## 2019-08-29 NOTE — Progress Notes (Signed)
Pt here for toradol injection.  Under aseptic technique 30mg  Toradol  IM given L deltoid. Tolerated well.  Bandaid applied.  She was having migraine.  Level 8 per note.  She was crying.  She stated she usually goes to urgent care or ED for migraine cocktail. She did not have time to do this. I stated if not better to call us, next time we are able to do depacon infusion if ordered per provider. She stated she was not aware.  She was not pregnant, and not allergic to toradol.  Wasted remaining 30mg  toradol witness MaryClare/RN

## 2019-08-29 NOTE — Telephone Encounter (Signed)
I called pt and she stated that migraine came on real quick, took maxalt, bc powder and nap and when woke from nap worse then ever. Eye swelling, fatigued, throbbing pain when up.  Not pregnant.  On aimovig, she also took med for nausea.  She is drinking. Level 8.  Wanting toradol to break cycle.  Please advise.  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

## 2019-08-29 NOTE — Telephone Encounter (Signed)
Spoke to pt and she will come in about 1400-1430 for toradol 30mg  IM injection.

## 2019-08-29 NOTE — Telephone Encounter (Signed)
If we can do nurse visit only, she can come in for torodol 30mg  IM injection. If she needs appt, I do not have any availability at this time but may be able to get her in with another provider.

## 2019-09-12 ENCOUNTER — Other Ambulatory Visit: Payer: Self-pay | Admitting: Neurology

## 2019-09-12 MED FILL — AIMOVIG 140 MG/ML SOAJ: 140 | 30 days supply | Qty: 1 | Fill #0

## 2019-12-08 ENCOUNTER — Other Ambulatory Visit: Payer: Self-pay

## 2019-12-08 ENCOUNTER — Ambulatory Visit
Admission: EM | Admit: 2019-12-08 | Discharge: 2019-12-08 | Disposition: A | Discharge status: Home / Self Care | Payer: BC Managed Care – PPO

## 2019-12-08 ENCOUNTER — Encounter: Payer: Self-pay | Admitting: Emergency Medicine

## 2019-12-08 DIAGNOSIS — R059 Cough, unspecified: Secondary | ICD-10-CM

## 2019-12-08 DIAGNOSIS — Z20828 Contact with and (suspected) exposure to other viral communicable diseases: Secondary | ICD-10-CM | POA: Diagnosis not present

## 2019-12-08 DIAGNOSIS — R05 Cough: Secondary | ICD-10-CM | POA: Diagnosis not present

## 2019-12-08 MED ORDER — ALBUTEROL SULFATE (2.5 MG/3ML) 0.083% IN NEBU: 2.5000 mg | INHALATION_SOLUTION | Freq: Four times a day (QID) | RESPIRATORY_TRACT | 0 refills | Status: DC | PRN

## 2019-12-08 MED ORDER — SUCRALFATE 1 G PO TABS: 1.0000 g | ORAL_TABLET | Freq: Three times a day (TID) | ORAL | 0 refills | Status: DC

## 2019-12-08 NOTE — Discharge Instructions (Addendum)
Try nebulizer at home. If no improvement or you have worsening of pain/breathing you need to go to the ER.

## 2019-12-08 NOTE — ED Triage Notes (Signed)
Pt presents to [facility] for assessment of body aches, fever of 101, fatigue, nasal congestion, back pain.  States Wednesday she was seen by her PCP and treated with antibiotics (azithromycin and prednisone) for sinusitis and possible pneumonia.  No COVID test done at that time.  Pt c/o upper back pain, to the point where even being touched by clothes is painful.  Patient also c/o worsening shortness of breath, so bad in the middle of the night last night she had a panic attack over it . Patient states being up and ambulating causing worsening of the shortness of breath.

## 2019-12-08 NOTE — ED Provider Notes (Signed)
[facility]    CSN: [id number] Arrival date & time: 12/08/19  1432      History   Chief Complaint Chief Complaint  Patient presents with  . URI    HPI [name] Sanchez is a 24 y.o. female with history of IBS, anxiety, endometriosis presenting for upper respiratory symptoms since early last week.  States she started a Z-Pak, prednisone on Wednesday as per "of small family practice" for sinusitis versus possible pneumonia.  Patient states her symptoms occur at night: Endorsing nocturnal fever (unknown T-max), shortness of breath, myalgias.  Fever tends to be alleviated by Tylenol and patient tried her son's albuterol inhaler for shortness of breath, though was unable to get adequate administration second to poor inspiratory effort from pain.  Patient states current therapy has not been helpful.  Denies history asthma, allergies, heart disease, heart failure, known Covid contacts.  Patient asymptomatic in office.   Past Medical History:  Diagnosis Date  . Anxiety   . Complication of anesthesia    wakes up crying  . Depression   . Endometriosis   . Family history of adverse reaction to anesthesia    hard to wake up, low heart rate  . Gestational hypertension 01/2012   also had postpartum preelampsia  . History of stomach ulcers   . Hx of ovarian cyst    x 2-3  . Hx of varicella   . IBS (irritable bowel syndrome)   . Migraine    chronic  . Panic attack   . Pinched nerve    left arm  . Plica of knee 08/2012   medial plica left knee    Patient Active Problem List   Diagnosis Date Noted  . BMI 37.0-37.9, adult 02/11/2019  . Swelling of lymph node 10/02/2018  . Seasonal affective disorder (HCC) 09/19/2018  . Urinary frequency 09/19/2018  . Status post hysterectomy 08/14/2018  . Acute post-traumatic headache, not intractable 07/30/2018  . Pelvic pain 07/31/2017  . Chronic migraine without aura without status migrainosus, not intractable 05/06/2016  . Depression  with anxiety 04/04/2012  . Healthcare maintenance 04/01/2012  . Migraine with aura     Past Surgical History:  Procedure Laterality Date  . KNEE ARTHROSCOPY  08/17/2012   Procedure: ARTHROSCOPY KNEE;  Surgeon: Harvie Junior, MD;  Location: [facility];  Service: Orthopedics;  Laterality: Left;  left knee scope with tight lateral retinacular release and plica removal  . TYMPANOSTOMY TUBE PLACEMENT Bilateral 1998  . VAGINAL HYSTERECTOMY Bilateral 08/14/2018   Procedure: HYSTERECTOMY VAGINAL WITH SALPINGECTOMY;  Surgeon: Reva Bores, MD;  Location: [facility];  Service: Gynecology;  Laterality: Bilateral;    OB History    Gravida  3   Para  2   Term  2   Preterm      AB  1   Living  2     SAB      TAB  1   Ectopic      Multiple      Live Births  2            Home Medications    Prior to Admission medications   Medication Sig Start Date End Date Taking? Authorizing Provider  azithromycin (ZITHROMAX) 500 MG tablet Take by mouth daily.   Yes [provider]  ondansetron (ZOFRAN) 4 MG tablet Take 1 tablet (4 mg total) by mouth every 8 (eight) hours as needed for nausea or vomiting. 02/11/19  Yes Danford, Jinny Blossom,  NP  predniSONE (STERAPRED UNI-PAK 21 TAB) 10 MG (21) TBPK tablet Take by mouth daily.   Yes [provider]  acetaminophen (TYLENOL 8 HOUR) 650 MG CR tablet Take 1 tablet (650 mg total) by mouth every 8 (eight) hours as needed. Patient taking differently: Take 650 mg by mouth every 8 (eight) hours as needed for pain.  04/17/18   Nanavati, Ankit, MD  AIMOVIG 140 MG/ML SOAJ INJECT 140 MG INTO THE SKIN EVERY 30 (THIRTY) DAYS. 09/12/19   Anson Fret, MD  albuterol (PROVENTIL) (2.5 MG/3ML) 0.083% nebulizer solution Take 3 mLs (2.5 mg total) by nebulization every 6 (six) hours as needed for wheezing or shortness of breath. 12/08/19   Hall-Potvin, [place], PA-C  ALPRAZolam (XANAX) 0.5 MG tablet Take 1 tablet (0.5 mg total) by mouth daily  as needed for anxiety. 11/12/18   Danford, Orpha Bur D, NP  FLUoxetine (PROZAC) 40 MG capsule Take 1 capsule (40 mg total) by mouth daily. Patient taking differently: Take 20 mg by mouth daily.  02/11/19   Danford, Orpha Bur D, NP  ibuprofen (ADVIL,MOTRIN) 600 MG tablet Take 1 tablet (600 mg total) by mouth every 6 (six) hours as needed. 08/15/18   Reva Bores, MD  metoCLOPramide (REGLAN) 10 MG tablet Take 1 tablet (10 mg total) by mouth 3 (three) times daily as needed. For nausea or migraine. 01/31/19   Anson Fret, MD  rizatriptan (MAXALT) 10 MG tablet Take 1 tablet (10 mg total) by mouth as needed for migraine. May repeat in 2 hours if needed. Do exceed 2 tablets in 24 hours. 12/27/18   Anson Fret, MD  sucralfate (CARAFATE) 1 g tablet Take 1 tablet (1 g total) by mouth 4 (four) times daily -  with meals and at bedtime. 12/08/19   Hall-Potvin, [place], PA-C  tiZANidine (ZANAFLEX) 4 MG tablet Take 1 tablet (4 mg total) by mouth every 6 (six) hours as needed for muscle spasms. Or headaches. 08/08/18   Anson Fret, MD  promethazine (PHENERGAN) 25 MG suppository Place 1 suppository (25 mg total) rectally every 6 (six) hours as needed for nausea or vomiting. 08/18/18 12/08/19  Rasch, Harolyn Rutherford, NP    Family History Family History  Problem Relation Age of Onset  . Hypertension Mother   . Heart disease Mother        MI  . Anesthesia problems Mother        post-op N/V  . Depression Mother   . Anxiety disorder Mother   . Obesity Mother   . Congestive Heart Failure Mother   . Asthma Sister   . Bipolar disorder Sister   . Asthma Brother   . Diabetes Maternal Aunt   . Heart disease Father   . Bipolar disorder Father   . Hyperlipidemia Father   . Uterine cancer Maternal Grandmother   . Pancreatic cancer Maternal Grandmother   . Lung cancer Maternal Grandfather     Social History Social History   Tobacco Use  . Smoking status: Never Smoker  . Smokeless tobacco: Never Used  Substance Use  Topics  . Alcohol use: Not Currently    Alcohol/week: 0.0 standard drinks    Comment: occassionally  . Drug use: No     Allergies   Percocet [oxycodone-acetaminophen], Cefuroxime axetil, and Penicillins   Review of Systems Review of Systems  Constitutional: Positive for fever. Negative for activity change, appetite change and fatigue.  HENT: Positive for congestion. Negative for ear pain, sinus pain, sore throat, trouble  swallowing and voice change.   Eyes: Negative for pain, redness and visual disturbance.  Respiratory: Positive for cough and shortness of breath. Negative for wheezing and stridor.   Cardiovascular: Positive for chest pain. Negative for palpitations and leg swelling.  Gastrointestinal: Negative for abdominal pain, diarrhea and vomiting.  Musculoskeletal: Positive for myalgias. Negative for arthralgias, neck pain and neck stiffness.  Skin: Negative for rash and wound.  Neurological: Negative for syncope and headaches.     Physical Exam Triage Vital Signs ED Triage Vitals  Enc Vitals Group     BP      Pulse      Resp      Temp      Temp src      SpO2      Weight      Height      Head Circumference      Peak Flow      Pain Score      Pain Loc      Pain Edu?      Excl. in [place]?    No data found.  Updated Vital Signs BP (!) 132/93 (BP Location: Right Arm)   Pulse 85   Temp 98.1 F (36.7 C) (Oral)   Resp 16   LMP 07/15/2018 (Exact Date)   SpO2 98%   Visual Acuity Right Eye Distance:   Left Eye Distance:   Bilateral Distance:    Right Eye Near:   Left Eye Near:    Bilateral Near:     Physical Exam Constitutional:      General: She is not in acute distress.    Appearance: She is not ill-appearing, toxic-appearing or diaphoretic.  HENT:     Head: Normocephalic and atraumatic.     Right Ear: Tympanic membrane, ear canal and external ear normal.     Left Ear: Tympanic membrane, ear canal and external ear normal.     Nose: Nose normal.      Mouth/Throat:     Mouth: Mucous membranes are moist.     Pharynx: Oropharynx is clear. No oropharyngeal exudate or posterior oropharyngeal erythema.  Eyes:     General: No scleral icterus.    Conjunctiva/sclera: Conjunctivae normal.     Pupils: Pupils are equal, round, and reactive to light.  Cardiovascular:     Rate and Rhythm: Normal rate and regular rhythm.     Heart sounds: No murmur. No gallop.   Pulmonary:     Effort: Pulmonary effort is normal. No respiratory distress.     Breath sounds: No stridor. No wheezing, rhonchi or rales.     Comments: Good air entry bilaterally without prolonged expiratory phase. Musculoskeletal:        General: Normal range of motion.     Cervical back: Neck supple. No tenderness.     Right lower leg: No edema.     Left lower leg: No edema.  Lymphadenopathy:     Cervical: No cervical adenopathy.  Skin:    Capillary Refill: Capillary refill takes less than 2 seconds.     Coloration: Skin is not jaundiced or pale.     Findings: No rash.  Neurological:     General: No focal deficit present.     Mental Status: She is alert and oriented to person, place, and time.      UC Treatments / Results  Labs (all labs ordered are listed, but only abnormal results are displayed) Labs Reviewed  NOVEL CORONAVIRUS, NAA    EKG  Radiology No results found.  Procedures Procedures (including critical care time)  Medications Ordered in UC Medications - No data to display  Initial Impression / Assessment and Plan / UC Course  I have reviewed the triage vital signs and the nursing notes.  Pertinent labs & imaging results that were available during my care of the patient were reviewed by me and considered in my medical decision making (see chart for details).     Patient afebrile, nontoxic, with SpO2 98%.  Unclear etiology at this time, as patient is without evidence of acute infection, asymptomatic in office.  Will trial nebulizer, Carafate for  symptoms reported by patient.  Covid PCR pending.  Patient to quarantine until results are back.  ER return precautions discussed, patient verbalized understanding and is agreeable to plan. Final Clinical Impressions(s) / UC Diagnoses   Final diagnoses:  Cough     Discharge Instructions     Try nebulizer at home. If no improvement or you have worsening of pain/breathing you need to go to the ER.    ED Prescriptions    Medication Sig Dispense Auth. Provider   albuterol (PROVENTIL) (2.5 MG/3ML) 0.083% nebulizer solution Take 3 mLs (2.5 mg total) by nebulization every 6 (six) hours as needed for wheezing or shortness of breath. 75 mL Hall-Potvin, [place], PA-C   sucralfate (CARAFATE) 1 g tablet Take 1 tablet (1 g total) by mouth 4 (four) times daily -  with meals and at bedtime. 12 tablet Hall-Potvin, [place], PA-C     I have reviewed the PDMP during this encounter.   Hall-Potvin, [place], [place] 12/08/19 1730

## 2019-12-09 LAB — NOVEL CORONAVIRUS, NAA: SARS-CoV-2, NAA: NOT DETECTED

## 2019-12-25 ENCOUNTER — Ambulatory Visit: Payer: BC Managed Care – PPO | Attending: Internal Medicine

## 2019-12-25 DIAGNOSIS — Z20822 Contact with and (suspected) exposure to covid-19: Secondary | ICD-10-CM

## 2019-12-26 LAB — NOVEL CORONAVIRUS, NAA: SARS-CoV-2, NAA: NOT DETECTED

## 2019-12-30 ENCOUNTER — Emergency Department (HOSPITAL_BASED_OUTPATIENT_CLINIC_OR_DEPARTMENT_OTHER)
Admission: EM | Admit: 2019-12-30 | Discharge: 2019-12-30 | Disposition: A | Discharge status: Home / Self Care | Payer: BLUE CROSS/BLUE SHIELD | Attending: Emergency Medicine | Admitting: Emergency Medicine

## 2019-12-30 ENCOUNTER — Other Ambulatory Visit: Payer: Self-pay

## 2019-12-30 ENCOUNTER — Encounter (HOSPITAL_BASED_OUTPATIENT_CLINIC_OR_DEPARTMENT_OTHER): Payer: Self-pay | Admitting: *Deleted

## 2019-12-30 DIAGNOSIS — G43809 Other migraine, not intractable, without status migrainosus: Secondary | ICD-10-CM | POA: Insufficient documentation

## 2019-12-30 DIAGNOSIS — Z885 Allergy status to narcotic agent status: Secondary | ICD-10-CM | POA: Insufficient documentation

## 2019-12-30 DIAGNOSIS — Z881 Allergy status to other antibiotic agents status: Secondary | ICD-10-CM | POA: Insufficient documentation

## 2019-12-30 DIAGNOSIS — Z79899 Other long term (current) drug therapy: Secondary | ICD-10-CM | POA: Insufficient documentation

## 2019-12-30 DIAGNOSIS — Z880 Allergy status to penicillin: Secondary | ICD-10-CM | POA: Diagnosis not present

## 2019-12-30 DIAGNOSIS — R519 Headache, unspecified: Secondary | ICD-10-CM | POA: Diagnosis present

## 2019-12-30 MED ORDER — SODIUM CHLORIDE 0.9 % IV BOLUS
500.0000 mL | Freq: Once | INTRAVENOUS | Status: AC
  Administered 2019-12-30: 500 mL via INTRAVENOUS

## 2019-12-30 MED ORDER — METOCLOPRAMIDE HCL 5 MG/ML IJ SOLN
10.0000 mg | Freq: Once | INTRAMUSCULAR | Status: AC
  Administered 2019-12-30: 10 mg via INTRAVENOUS
  Filled 2019-12-30: qty 2

## 2019-12-30 MED ORDER — DEXAMETHASONE SODIUM PHOSPHATE 4 MG/ML IJ SOLN
4.0000 mg | Freq: Once | INTRAMUSCULAR | Status: AC
  Administered 2019-12-30: 4 mg via INTRAVENOUS
  Filled 2019-12-30: qty 1

## 2019-12-30 MED ORDER — DIPHENHYDRAMINE HCL 50 MG/ML IJ SOLN
25.0000 mg | Freq: Once | INTRAMUSCULAR | Status: AC
  Administered 2019-12-30: 25 mg via INTRAVENOUS
  Filled 2019-12-30: qty 1

## 2019-12-30 MED ORDER — KETOROLAC TROMETHAMINE 15 MG/ML IJ SOLN
15.0000 mg | Freq: Once | INTRAMUSCULAR | Status: AC
  Administered 2019-12-30: 15 mg via INTRAVENOUS
  Filled 2019-12-30: qty 1

## 2019-12-30 NOTE — ED Provider Notes (Signed)
[facility]PARTMENT Provider Note   CSN: [id number] Arrival date & time: 12/30/19  2106     History CC: migraine  [name] Sanchez is a 24 y.o. female with a history of chronic migraines presenting emergency department with recurrent migraine.  She reports onset of symptoms approximately 3 to 4 days ago.  She has a left-sided frontal headache associated with eye pain, which she says is common for migraines.  He says she is not been able to take her normally prescribed migraine medications because she lost her insurance.  Normally she is on sumatriptan at home as well as Reglan.  He presents to the emergency department due to pain.  She reports nausea.  She reports photophobia.  She denies fevers, chills, congestion, cough, URI symptoms.  HPI     Past Medical History:  Diagnosis Date  . Anxiety   . Complication of anesthesia    wakes up crying  . Depression   . Endometriosis   . Family history of adverse reaction to anesthesia    hard to wake up, low heart rate  . Gestational hypertension 01/2012   also had postpartum preelampsia  . History of stomach ulcers   . Hx of ovarian cyst    x 2-3  . Hx of varicella   . IBS (irritable bowel syndrome)   . Migraine    chronic  . Panic attack   . Pinched nerve    left arm  . Plica of knee 0/2585   medial plica left knee    Patient Active Problem List   Diagnosis Date Noted  . BMI 37.0-37.9, adult 02/11/2019  . Swelling of lymph node 10/02/2018  . Seasonal affective disorder ([place]) 09/19/2018  . Urinary frequency 09/19/2018  . Status post hysterectomy 08/14/2018  . Acute post-traumatic headache, not intractable 07/30/2018  . Pelvic pain 07/31/2017  . Chronic migraine without aura without status migrainosus, not intractable 05/06/2016  . Depression with anxiety 04/04/2012  . Healthcare maintenance 04/01/2012  . Migraine with aura     Past Surgical History:  Procedure Laterality Date  . KNEE ARTHROSCOPY   08/17/2012   Procedure: ARTHROSCOPY KNEE;  Surgeon: Alta Corning, MD;  Location: [facility];  Service: Orthopedics;  Laterality: Left;  left knee scope with tight lateral retinacular release and plica removal  . TYMPANOSTOMY TUBE PLACEMENT Bilateral 1998  . VAGINAL HYSTERECTOMY Bilateral 08/14/2018   Procedure: HYSTERECTOMY VAGINAL WITH SALPINGECTOMY;  Surgeon: Donnamae Jude, MD;  Location: [facility];  Service: Gynecology;  Laterality: Bilateral;     OB History    Gravida  3   Para  2   Term  2   Preterm      AB  1   Living  2     SAB      TAB  1   Ectopic      Multiple      Live Births  2           Family History  Problem Relation Age of Onset  . Hypertension Mother   . Heart disease Mother        MI  . Anesthesia problems Mother        post-op N/V  . Depression Mother   . Anxiety disorder Mother   . Obesity Mother   . Congestive Heart Failure Mother   . Asthma Sister   . Bipolar disorder Sister   . Asthma Brother   . Diabetes Maternal Aunt   .  Heart disease Father   . Bipolar disorder Father   . Hyperlipidemia Father   . Uterine cancer Maternal Grandmother   . Pancreatic cancer Maternal Grandmother   . Lung cancer Maternal Grandfather     Social History   Tobacco Use  . Smoking status: Never Smoker  . Smokeless tobacco: Never Used  Substance Use Topics  . Alcohol use: Not Currently    Alcohol/week: 0.0 standard drinks    Comment: occassionally  . Drug use: No    Home Medications Prior to Admission medications   Medication Sig Start Date End Date Taking? Authorizing Provider  acetaminophen (TYLENOL 8 HOUR) 650 MG CR tablet Take 1 tablet (650 mg total) by mouth every 8 (eight) hours as needed. Patient taking differently: Take 650 mg by mouth every 8 (eight) hours as needed for pain.  04/17/18   Nanavati, Ankit, MD  AIMOVIG 140 MG/ML SOAJ INJECT 140 MG INTO THE SKIN EVERY 30 (THIRTY) DAYS. 09/12/19   Anson Fret, MD    albuterol (PROVENTIL) (2.5 MG/3ML) 0.083% nebulizer solution Take 3 mLs (2.5 mg total) by nebulization every 6 (six) hours as needed for wheezing or shortness of breath. 12/08/19   Hall-Potvin, [place], PA-C  ALPRAZolam (XANAX) 0.5 MG tablet Take 1 tablet (0.5 mg total) by mouth daily as needed for anxiety. 11/12/18   Danford, Orpha Bur D, NP  azithromycin (ZITHROMAX) 500 MG tablet Take by mouth daily.    [provider]  FLUoxetine (PROZAC) 40 MG capsule Take 1 capsule (40 mg total) by mouth daily. Patient taking differently: Take 20 mg by mouth daily.  02/11/19   Danford, Orpha Bur D, NP  ibuprofen (ADVIL,MOTRIN) 600 MG tablet Take 1 tablet (600 mg total) by mouth every 6 (six) hours as needed. 08/15/18   Reva Bores, MD  metoCLOPramide (REGLAN) 10 MG tablet Take 1 tablet (10 mg total) by mouth 3 (three) times daily as needed. For nausea or migraine. 01/31/19   Anson Fret, MD  ondansetron (ZOFRAN) 4 MG tablet Take 1 tablet (4 mg total) by mouth every 8 (eight) hours as needed for nausea or vomiting. 02/11/19   Danford, Orpha Bur D, NP  predniSONE (STERAPRED UNI-PAK 21 TAB) 10 MG (21) TBPK tablet Take by mouth daily.    [provider]  rizatriptan (MAXALT) 10 MG tablet Take 1 tablet (10 mg total) by mouth as needed for migraine. May repeat in 2 hours if needed. Do exceed 2 tablets in 24 hours. 12/27/18   Anson Fret, MD  sucralfate (CARAFATE) 1 g tablet Take 1 tablet (1 g total) by mouth 4 (four) times daily -  with meals and at bedtime. 12/08/19   Hall-Potvin, [place], PA-C  tiZANidine (ZANAFLEX) 4 MG tablet Take 1 tablet (4 mg total) by mouth every 6 (six) hours as needed for muscle spasms. Or headaches. 08/08/18   Anson Fret, MD  promethazine (PHENERGAN) 25 MG suppository Place 1 suppository (25 mg total) rectally every 6 (six) hours as needed for nausea or vomiting. 08/18/18 12/08/19  Rasch, Victorino Dike I, NP    Allergies    Percocet [oxycodone-acetaminophen], Cefuroxime axetil, and  Penicillins  Review of Systems   Review of Systems  Constitutional: Negative for chills and fever.  Eyes: Positive for photophobia and visual disturbance.  Respiratory: Negative for cough and shortness of breath.   Cardiovascular: Negative for chest pain.  Gastrointestinal: Positive for nausea. Negative for vomiting.  Musculoskeletal: Negative for neck pain and neck stiffness.  Neurological: Positive for light-headedness  and headaches. Negative for seizures and syncope.  All other systems reviewed and are negative.   Physical Exam Updated Vital Signs BP 104/65 (BP Location: Left Arm)   Pulse 69   Temp 97.7 F (36.5 C) (Oral)   Resp 15   Ht 5\' 4"  (1.626 m)   Wt 99.8 kg   LMP 07/15/2018 (Exact Date)   SpO2 100%   BMI 37.76 kg/m   Physical Exam Vitals and nursing note reviewed.  Constitutional:      Appearance: She is well-developed.     Comments: Lying in dark room with towel over face  HENT:     Head: Normocephalic and atraumatic.  Eyes:     Extraocular Movements: Extraocular movements intact.     Conjunctiva/sclera: Conjunctivae normal.     Pupils: Pupils are equal, round, and reactive to light.  Cardiovascular:     Rate and Rhythm: Normal rate and regular rhythm.  Pulmonary:     Effort: Pulmonary effort is normal. No respiratory distress.  Musculoskeletal:     Cervical back: Neck supple.  Skin:    General: Skin is warm and dry.  Neurological:     General: No focal deficit present.     Mental Status: She is alert and oriented to person, place, and time.     Cranial Nerves: No cranial nerve deficit.     Sensory: No sensory deficit.  Psychiatric:        Mood and Affect: Mood normal.        Behavior: Behavior normal.     ED Results / Procedures / Treatments   Labs (all labs ordered are listed, but only abnormal results are displayed) Labs Reviewed - No data to display  EKG None  Radiology No results found.  Procedures Procedures (including critical  care time)  Medications Ordered in ED Medications  ketorolac (TORADOL) 15 MG/ML injection 15 mg (15 mg Intravenous Given 12/30/19 2227)  metoCLOPramide (REGLAN) injection 10 mg (10 mg Intravenous Given 12/30/19 2227)  dexamethasone (DECADRON) injection 4 mg (4 mg Intravenous Given 12/30/19 2227)  diphenhydrAMINE (BENADRYL) injection 25 mg (25 mg Intravenous Given 12/30/19 2226)  sodium chloride 0.9 % bolus 500 mL (0 mLs Intravenous Stopped 12/30/19 2331)    ED Course  I have reviewed the triage vital signs and the nursing notes.  Pertinent labs & imaging results that were available during my care of the patient were reviewed by me and considered in my medical decision making (see chart for details).  24 yo female presenting with migraine headache, in its typical pattern.  Reports she's out of her migraine medications due to insurance issues, but is in the process of reacquiring these.  Low suspicion for meningitis, SAH, ICH, stroke based on this presentation.  Vitals unremarkable.  Afebrile. I do not believe she requires [facility] or bloodwork at this time  Plan for IV migraine cocktail and reassess symptoms   Clinical Course as of Dec 30 1124  Mon Dec 30, 2019  2324 Patient is feeling better, reports to me that the edges off of her headache.  She is ready to go home.   [MT]    Clinical Course User Index [MT] Babygirl Trager, [id number], MD    Final Clinical Impression(s) / ED Diagnoses Final diagnoses:  Other migraine without status migrainosus, not intractable    Rx / DC Orders ED Discharge Orders    None       Kermit Balo, MD 12/31/19 1126

## 2019-12-30 NOTE — ED Triage Notes (Signed)
Headache. Hx of migraines. States she was unable to control the pain at home. She has not been able to take her normal migraine medication due to loss of insurance.

## 2020-01-01 ENCOUNTER — Telehealth: Payer: Self-pay | Admitting: Family Medicine

## 2020-01-01 NOTE — Telephone Encounter (Signed)
Pt called stating that she is needing a refill on her tiZANidine (ZANAFLEX) 4 MG tablet and her AIMOVIG 140 MG/ML SOAJ sent in to the [facility].

## 2020-01-02 NOTE — Telephone Encounter (Signed)
lmvm for pt to return call for appt with NP this afternoon or 1130 with AL/NP 01-06-20.

## 2020-01-02 NOTE — Telephone Encounter (Signed)
Pt has called back and took next avail which is 02-04 and will check in at 8:00 that morning

## 2020-01-08 ENCOUNTER — Encounter: Payer: Self-pay | Admitting: Family Medicine

## 2020-01-08 ENCOUNTER — Other Ambulatory Visit: Payer: Self-pay

## 2020-01-08 ENCOUNTER — Ambulatory Visit: Payer: BLUE CROSS/BLUE SHIELD | Admitting: Family Medicine

## 2020-01-08 VITALS — BP 125/79 | HR 95 | Temp 97.3°F | Ht 64.0 in | Wt 227.8 lb

## 2020-01-08 DIAGNOSIS — G43109 Migraine with aura, not intractable, without status migrainosus: Secondary | ICD-10-CM | POA: Diagnosis not present

## 2020-01-08 MED ORDER — METOCLOPRAMIDE HCL 10 MG PO TABS: 10.0000 mg | ORAL_TABLET | Freq: Three times a day (TID) | ORAL | 0 refills | Status: DC | PRN

## 2020-01-08 MED ORDER — RIZATRIPTAN BENZOATE 10 MG PO TABS: 10.0000 mg | ORAL_TABLET | ORAL | 11 refills | Status: DC | PRN

## 2020-01-08 MED ORDER — AIMOVIG 140 MG/ML ~~LOC~~ SOAJ: 140.0000 mg | SUBCUTANEOUS | 11 refills | Status: DC

## 2020-01-08 MED ORDER — RIZATRIPTAN BENZOATE 10 MG PO TABS: 10.0000 mg | ORAL_TABLET | ORAL | 0 refills | Status: DC | PRN

## 2020-01-08 MED ORDER — AIMOVIG 140 MG/ML ~~LOC~~ SOAJ: 140.0000 mg | SUBCUTANEOUS | 0 refills | Status: DC

## 2020-01-08 MED ORDER — TIZANIDINE HCL 4 MG PO TABS: 4.0000 mg | ORAL_TABLET | Freq: Four times a day (QID) | ORAL | 0 refills | Status: DC | PRN

## 2020-01-08 MED FILL — AIMOVIG 140 MG/ML SOAJ: 140 | 30 days supply | Qty: 1 | Fill #0

## 2020-01-08 MED FILL — RIZATRIPTAN BENZOATE 10 MG: 10 | 27 days supply | Qty: 9 | Fill #0

## 2020-01-08 MED FILL — tiZANidine HCL 4 MG TABS: 4 | 8 days supply | Qty: 30 | Fill #0

## 2020-01-08 MED FILL — METOCLOPRAMIDE 10 MG TABLET: 10 | 3 days supply | Qty: 10 | Fill #0

## 2020-01-08 NOTE — Progress Notes (Addendum)
PATIENT: [name] Sanchez DOB: 09/18/1996  REASON FOR VISIT: follow up HISTORY FROM: patient  Chief Complaint  Patient presents with  . Follow-up    Rm1. Alone. Sturggling with migraines for the last 2 weeks. States she is out of all her headach e medications. Patient stipulates she now has vertigo with her migraines.     HISTORY OF PRESENT ILLNESS: Today 01/08/20 [name] Sanchez is a 24 y.o. female here today for follow up for migraines. She was doing well on Amovig but lost her insurance in November. Last injection was in October. Since, headaches have returned. She was seen in the ER recently due to intractable migraine. She does have white flashing aura before migraine. She is not on birth control as she is s/p hysterectomy. Rizatriptan, tizanidine and Reglan help to abort headache. She rarely uses these medications when taking Amovig regularly.  She does have seasonal allergies and vertigo symptoms from time to time. Xyzal helps.   HISTORY: (copied from my note on 02/11/2019)  [name] Sanchez is a 24 y.o. female here today for follow up on migraines. She was doing well on Amovig and unfortunately had a lapse in insurance causing her to be without medication for about 3 months. She just recently got Amovig approved again. She has had more frequent and severe migraines. Unusally unilateral with radiation to the back of her head. She is very nauseous and sensitive to light and sounds. She was recently given metoclopramide that has helped some.  She complains of a pressure sensation behind the left eye that radiates to the back of her head. She has throbbing with nausea and vomiting. Light and sound make it worse. Sleep makes it better. She is currently being treated for a sinus infection. She has been on a total of three antibiotics. She does not feel that the antibiotics are helping. She is taking metoclopramide occasionally. Last dose was last night. She has not taken Maxalt in about 2  weeks. She took her last dose of Zofran last night.   She had an MRI ordered in September. She has not had this done yet.   Last eye exam July 2019.    HISTORY: (copied from Dr Trevor Mace note on 08/08/2018) [name] N Turneris a 24 y.o.femalehere as requested by Dr. Sherre Poot chronic migraines.Past medical history of migraine, depression, anxiety. Migraines since a child. No Fhx of migraies that she knows of. Migraines really started in HS. Seen by Neuro Dr. Sherryll Burger and here Dr. Neale Burly in [place] and tried nerve blocks, topamax, trigger point injections, sphenopalatine ganglion block. Triptans made it worse. Also tried Amitriptyline. She had an amazing response to Aimovig will reorder. Daily headaches with superimposed migraines 15 migraine days a month, severe, worsening, last 1-2 days, pounding/pulsating/throbbing, +light sensitivity/ noise sensitivity, nausea has vomited movement akes it worse. She has black and white spots of aura.   Reviewed notes, labs and imaging from outside physicians, which showed:  Reviewed notes from [facility]. Patient was first seen in August of this year as a new patient. Patient reported migraine with aura estimated to have 2-3 headaches per week she was followed by [facility] but is coming to [place] because of health insurance coverage she is not currently on any migraine Rx. She also has chronic anxiety and depression. 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 Exam was unremarkable.  Reviewed emergency room notes from August 03, 2018 for progressively worsening left-sided headache the same as previous migraines  with associated nausea vomiting photophobia and phonophobia previously followed by a neurologist in [place]. Not on any preventative or abortive therapies. She is been going to urgent care for IM Toradol and Phenergan when her headaches get bad. She also fell down the stairs 1 week prior and hit her head with intermittent  blurred vision and an increase in her migraine since the falls.  Reviewed CT scan report 08/03/2018:IMPRESSION: Limited evaluation of the brain due to severe motion artifact and nondiagnostic study of the lower portion of the brain and base of the skull. No acute intracranial pathology identified in the upper portion of the brain. If there is high clinical concern for acute intracranial pathology, repeat CT is recommended.   REVIEW OF SYSTEMS: Out of a complete 14 system review of symptoms, the patient complains only of the following symptoms, headaches, seasonal allergies, and all other reviewed systems are negative.  ALLERGIES: Allergies  Allergen Reactions  . Percocet [Oxycodone-Acetaminophen] Hives and Itching  . Cefuroxime Axetil Rash  . Penicillins Rash    HOME MEDICATIONS: Outpatient Medications Prior to Visit  Medication Sig Dispense Refill  . acetaminophen (TYLENOL 8 HOUR) 650 MG CR tablet Take 1 tablet (650 mg total) by mouth every 8 (eight) hours as needed. (Patient taking differently: Take 650 mg by mouth every 8 (eight) hours as needed for pain. ) 30 tablet 0  . ALPRAZolam (XANAX) 0.5 MG tablet Take 1 tablet (0.5 mg total) by mouth daily as needed for anxiety. 20 tablet 0  . FLUoxetine (PROZAC) 40 MG capsule Take 1 capsule (40 mg total) by mouth daily. (Patient taking differently: Take 20 mg by mouth daily. ) 90 capsule 3  . ibuprofen (ADVIL,MOTRIN) 600 MG tablet Take 1 tablet (600 mg total) by mouth every 6 (six) hours as needed. 30 tablet 1  . ondansetron (ZOFRAN) 4 MG tablet Take 1 tablet (4 mg total) by mouth every 8 (eight) hours as needed for nausea or vomiting. 20 tablet 1  . sucralfate (CARAFATE) 1 g tablet Take 1 tablet (1 g total) by mouth 4 (four) times daily -  with meals and at bedtime. 12 tablet 0  . AIMOVIG 140 MG/ML SOAJ INJECT 140 MG INTO THE SKIN EVERY 30 (THIRTY) DAYS. 1 mL 1  . metoCLOPramide (REGLAN) 10 MG tablet Take 1 tablet (10 mg total) by mouth 3  (three) times daily as needed. For nausea or migraine. 30 tablet 6  . rizatriptan (MAXALT) 10 MG tablet Take 1 tablet (10 mg total) by mouth as needed for migraine. May repeat in 2 hours if needed. Do exceed 2 tablets in 24 hours. 9 tablet 11  . tiZANidine (ZANAFLEX) 4 MG tablet Take 1 tablet (4 mg total) by mouth every 6 (six) hours as needed for muscle spasms. Or headaches. 30 tablet 0  . albuterol (PROVENTIL) (2.5 MG/3ML) 0.083% nebulizer solution Take 3 mLs (2.5 mg total) by nebulization every 6 (six) hours as needed for wheezing or shortness of breath. 75 mL 0  . azithromycin (ZITHROMAX) 500 MG tablet Take by mouth daily.    . predniSONE (STERAPRED UNI-PAK 21 TAB) 10 MG (21) TBPK tablet Take by mouth daily.     No facility-administered medications prior to visit.    PAST MEDICAL HISTORY: Past Medical History:  Diagnosis Date  . Anxiety   . Complication of anesthesia    wakes up crying  . Depression   . Endometriosis   . Family history of adverse reaction to anesthesia    hard  to wake up, low heart rate  . Gestational hypertension 01/2012   also had postpartum preelampsia  . History of stomach ulcers   . Hx of ovarian cyst    x 2-3  . Hx of varicella   . IBS (irritable bowel syndrome)   . Migraine    chronic  . Panic attack   . Pinched nerve    left arm  . Plica of knee 08/2012   medial plica left knee    PAST SURGICAL HISTORY: Past Surgical History:  Procedure Laterality Date  . KNEE ARTHROSCOPY  08/17/2012   Procedure: ARTHROSCOPY KNEE;  Surgeon: Harvie JuniorJohn L Graves, MD;  Location: [facility];  Service: Orthopedics;  Laterality: Left;  left knee scope with tight lateral retinacular release and plica removal  . TYMPANOSTOMY TUBE PLACEMENT Bilateral 1998  . VAGINAL HYSTERECTOMY Bilateral 08/14/2018   Procedure: HYSTERECTOMY VAGINAL WITH SALPINGECTOMY;  Surgeon: Reva BoresPratt, Tanya S, MD;  Location: [facility];  Service: Gynecology;  Laterality: Bilateral;    FAMILY  HISTORY: Family History  Problem Relation Age of Onset  . Hypertension Mother   . Heart disease Mother        MI  . Anesthesia problems Mother        post-op N/V  . Depression Mother   . Anxiety disorder Mother   . Obesity Mother   . Congestive Heart Failure Mother   . Asthma Sister   . Bipolar disorder Sister   . Asthma Brother   . Diabetes Maternal Aunt   . Heart disease Father   . Bipolar disorder Father   . Hyperlipidemia Father   . Uterine cancer Maternal Grandmother   . Pancreatic cancer Maternal Grandmother   . Lung cancer Maternal Grandfather     SOCIAL HISTORY: Social History   Socioeconomic History  . Marital status: Married    Spouse name: Will  . Number of children: 2  . Years of education: Not on file  . Highest education level: Some college, no degree  Occupational History  . Occupation: Furniture conservator/restorerT Tech    Employer: [employer]  Tobacco Use  . Smoking status: Never Smoker  . Smokeless tobacco: Never Used  Substance and Sexual Activity  . Alcohol use: Not Currently    Alcohol/week: 0.0 standard drinks    Comment: occassionally  . Drug use: No  . Sexual activity: Yes    Partners: Male    Birth control/protection: None, Surgical    Comment: last night  Other Topics Concern  . Not on file  Social History Narrative   Lives at home with her husband and children   Right handed   Caffeine 3 cups daily   Social Determinants of Health   Financial Resource Strain:   . Difficulty of Paying Living Expenses: Not on file  Food Insecurity:   . Worried About Programme researcher, broadcasting/film/videounning Out of Food in the Last Year: Not on file  . Ran Out of Food in the Last Year: Not on file  Transportation Needs:   . Lack of Transportation (Medical): Not on file  . Lack of Transportation (Non-Medical): Not on file  Physical Activity:   . Days of Exercise per Week: Not on file  . Minutes of Exercise per Session: Not on file  Stress:   . Feeling of Stress : Not on file  Social Connections:     . Frequency of Communication with Friends and Family: Not on file  . Frequency of Social Gatherings with Friends and Family: Not on  file  . Attends Religious Services: Not on file  . Active Member of Clubs or Organizations: Not on file  . Attends Banker Meetings: Not on file  . Marital Status: Not on file  Intimate Partner Violence:   . Fear of Current or Ex-Partner: Not on file  . Emotionally Abused: Not on file  . Physically Abused: Not on file  . Sexually Abused: Not on file      PHYSICAL EXAM  Vitals:   01/08/20 0821  BP: 125/79  Pulse: 95  Temp: (!) 97.3 F (36.3 C)  Weight: 227 lb 12.8 oz (103.3 kg)  Height: 5\' 4"  (1.626 m)   Body mass index is 39.1 kg/m.  Generalized: Well developed, in no acute distress  Cardiology: normal rate and rhythm, no murmur noted Respiratory: Clear to auscultation bilaterally  Neurological examination  Mentation: Alert oriented to time, place, history taking. Follows all commands speech and language fluent Cranial nerve II-XII: Pupils were equal round reactive to light. Extraocular movements were full, visual field were full  Motor: The motor testing reveals 5 over 5 strength of all 4 extremities. Good symmetric motor tone is noted throughout.  Gait and station: Gait is normal.   DIAGNOSTIC DATA (LABS, IMAGING, TESTING) - I reviewed patient records, labs, notes, testing and imaging myself where available.  No flowsheet data found.   Lab Results  Component Value Date   WBC 8.7 08/18/2018   HGB 13.4 08/18/2018   HCT 40.4 08/18/2018   MCV 92.9 08/18/2018   PLT 270 08/18/2018      Component Value Date/Time   NA 137 08/18/2018 2032   NA 139 05/24/2017 1027   K 4.1 08/18/2018 2032   CL 101 08/18/2018 2032   CO2 28 08/18/2018 2032   GLUCOSE 110 (H) 08/18/2018 2032   BUN 13 08/18/2018 2032   BUN 14 05/24/2017 1027   CREATININE 0.67 08/18/2018 2032   CALCIUM 8.9 08/18/2018 2032   PROT 7.0 08/18/2018 2032   PROT  7.1 05/24/2017 1027   ALBUMIN 3.8 08/18/2018 2032   ALBUMIN 4.2 05/24/2017 1027   AST 18 08/18/2018 2032   ALT 19 08/18/2018 2032   ALKPHOS 55 08/18/2018 2032   BILITOT 0.5 08/18/2018 2032   BILITOT 0.4 05/24/2017 1027   GFRNONAA >60 08/18/2018 2032   GFRAA >60 08/18/2018 2032   Lab Results  Component Value Date   CHOL 168 05/24/2017   HDL 59 05/24/2017   LDLCALC 94 05/24/2017   TRIG 76 05/24/2017   CHOLHDL 3 04/04/2012   Lab Results  Component Value Date   HGBA1C 5.1 05/24/2017   Lab Results  Component Value Date   VITAMINB12 389 05/24/2017   Lab Results  Component Value Date   TSH 1.790 05/24/2017     ASSESSMENT AND PLAN 24 y.o. year old female  has a past medical history of Anxiety, Complication of anesthesia, Depression, Endometriosis, Family history of adverse reaction to anesthesia, Gestational hypertension (01/2012), History of stomach ulcers, ovarian cyst, varicella, IBS (irritable bowel syndrome), Migraine, Panic attack, Pinched nerve, and Plica of knee (08/2012). here with     ICD-10-CM   1. Migraine with aura and without status migrainosus, not intractable  G43.109     Gracelynne has had an increase of migraine intensity and frequency while being off Amovig. We will restart therapy asap. Rizatriptan, tizanidine and Reglan sparingly for abortive therapy. She will continue Xyzal and ensure adequate hydration. We will consider restarting topiramate in the future if headaches  persist as she tolerated this well in the past. Well balanced diet and regular exercise advised. Follow up in 6-12 months. She verbalizes understanding and agreement with this plan.    No orders of the defined types were placed in this encounter.    Meds ordered this encounter  Medications  . DISCONTD: Erenumab-aooe (AIMOVIG) 140 MG/ML SOAJ    Sig: Inject 140 mg into the skin every 30 (thirty) days.    Dispense:  1 pen    Refill:  11    Order Specific Question:   Supervising Provider     Answer:   Melvenia Beam [id number]  . DISCONTD: rizatriptan (MAXALT) 10 MG tablet    Sig: Take 1 tablet (10 mg total) by mouth as needed for migraine. May repeat in 2 hours if needed. Do exceed 2 tablets in 24 hours.    Dispense:  9 tablet    Refill:  11    Order Specific Question:   Supervising Provider    Answer:   Melvenia Beam [id number]  . rizatriptan (MAXALT) 10 MG tablet    Sig: Take 1 tablet (10 mg total) by mouth as needed for migraine. May repeat in 2 hours if needed. Do exceed 2 tablets in 24 hours.    Dispense:  9 tablet    Refill:  0    Order Specific Question:   Supervising Provider    Answer:   Melvenia Beam [id number]  . Erenumab-aooe (AIMOVIG) 140 MG/ML SOAJ    Sig: Inject 140 mg into the skin every 30 (thirty) days.    Dispense:  1 pen    Refill:  0    Order Specific Question:   Supervising Provider    Answer:   Melvenia Beam [id number]  . tiZANidine (ZANAFLEX) 4 MG tablet    Sig: Take 1 tablet (4 mg total) by mouth every 6 (six) hours as needed for muscle spasms. Or headaches.    Dispense:  30 tablet    Refill:  0    Order Specific Question:   Supervising Provider    Answer:   Melvenia Beam [id number]  . metoCLOPramide (REGLAN) 10 MG tablet    Sig: Take 1 tablet (10 mg total) by mouth 3 (three) times daily as needed. For nausea or migraine.    Dispense:  10 tablet    Refill:  0    Order Specific Question:   Supervising Provider    Answer:   Melvenia Beam [id number]      I spent 15 minutes with the patient. 50% of this time was spent counseling and educating patient on plan of care and medications.    Debbora Presto, FNP-C 01/08/2020, 9:04 AM [facility] [address], [place], [place] [address] [phone]  Made any corrections needed, and agree with history, physical, neuro exam,assessment and plan as stated.     Sarina Ill, MD [facility]

## 2020-01-08 NOTE — Patient Instructions (Addendum)
We will restart Amovig  Use rizatriptan, metoclopramide and tizanidine sparingly as needed   Drink plenty of water   Follow up in 6 months   Migraine Headache A migraine headache is a very strong throbbing pain on one side or both sides of your head. This type of headache can also cause other symptoms. It can last from 4 hours to 3 days. Talk with your doctor about what things may bring on (trigger) this condition. What are the causes? The exact cause of this condition is not known. This condition may be triggered or caused by:  Drinking alcohol.  Smoking.  Taking medicines, such as: ? Medicine used to treat chest pain (nitroglycerin). ? Birth control pills. ? Estrogen. ? Some blood pressure medicines.  Eating or drinking certain products.  Doing physical activity. Other things that may trigger a migraine headache include:  Having a menstrual period.  Pregnancy.  Hunger.  Stress.  Not getting enough sleep or getting too much sleep.  Weather changes.  Tiredness (fatigue). What increases the risk?  Being 29-36 years old.  Being female.  Having a family history of migraine headaches.  Being Caucasian.  Having depression or anxiety.  Being very overweight. What are the signs or symptoms?  A throbbing pain. This pain may: ? Happen in any area of the head, such as on one side or both sides. ? Make it hard to do daily activities. ? Get worse with physical activity. ? Get worse around bright lights or loud noises.  Other symptoms may include: ? Feeling sick to your stomach (nauseous). ? Vomiting. ? Dizziness. ? Being sensitive to bright lights, loud noises, or smells.  Before you get a migraine headache, you may get warning signs (an aura). An aura may include: ? Seeing flashing lights or having blind spots. ? Seeing bright spots, halos, or zigzag lines. ? Having tunnel vision or blurred vision. ? Having numbness or a tingling feeling. ? Having  trouble talking. ? Having weak muscles.  Some people have symptoms after a migraine headache (postdromal phase), such as: ? Tiredness. ? Trouble thinking (concentrating). How is this treated?  Taking medicines that: ? Relieve pain. ? Relieve the feeling of being sick to your stomach. ? Prevent migraine headaches.  Treatment may also include: ? Having acupuncture. ? Avoiding foods that bring on migraine headaches. ? Learning ways to control your body functions (biofeedback). ? Therapy to help you know and deal with negative thoughts (cognitive behavioral therapy). Follow these instructions at home: Medicines  Take over-the-counter and prescription medicines only as told by your doctor.  Ask your doctor if the medicine prescribed to you: ? Requires you to avoid driving or using heavy machinery. ? Can cause trouble pooping (constipation). You may need to take these steps to prevent or treat trouble pooping:  Drink enough fluid to keep your pee (urine) pale yellow.  Take over-the-counter or prescription medicines.  Eat foods that are high in fiber. These include beans, whole grains, and fresh fruits and vegetables.  Limit foods that are high in fat and sugar. These include fried or sweet foods. Lifestyle  Do not drink alcohol.  Do not use any products that contain nicotine or tobacco, such as cigarettes, e-cigarettes, and chewing tobacco. If you need help quitting, ask your doctor.  Get at least 8 hours of sleep every night.  Limit and deal with stress. General instructions      Keep a journal to find out what may bring on your migraine  headaches. For example, write down: ? What you eat and drink. ? How much sleep you get. ? Any change in what you eat or drink. ? Any change in your medicines.  If you have a migraine headache: ? Avoid things that make your symptoms worse, such as bright lights. ? It may help to lie down in a dark, quiet room. ? Do not drive or use  heavy machinery. ? Ask your doctor what activities are safe for you.  Keep all follow-up visits as told by your doctor. This is important. Contact a doctor if:  You get a migraine headache that is different or worse than others you have had.  You have more than 15 headache days in one month. Get help right away if:  Your migraine headache gets very bad.  Your migraine headache lasts longer than 72 hours.  You have a fever.  You have a stiff neck.  You have trouble seeing.  Your muscles feel weak or like you cannot control them.  You start to lose your balance a lot.  You start to have trouble walking.  You pass out (faint).  You have a seizure. Summary  A migraine headache is a very strong throbbing pain on one side or both sides of your head. These headaches can also cause other symptoms.  This condition may be treated with medicines and changes to your lifestyle.  Keep a journal to find out what may bring on your migraine headaches.  Contact a doctor if you get a migraine headache that is different or worse than others you have had.  Contact your doctor if you have more than 15 headache days in a month. This information is not intended to replace advice given to you by your health care provider. Make sure you discuss any questions you have with your health care provider. Document Revised: 03/15/2019 Document Reviewed: 01/03/2019 Elsevier Patient Education  [place].

## 2020-01-09 ENCOUNTER — Ambulatory Visit: Payer: 59 | Admitting: Family Medicine

## 2020-01-09 ENCOUNTER — Telehealth: Payer: Self-pay

## 2020-01-09 NOTE — Telephone Encounter (Signed)
PA for Aimovig has been initiated on CMM. Key: [id number]. Rx #: [id number]. ICD 10 code: G43.709    This request has received a Favorable outcome from [facility].  Please keep in mind this is not a guarantee of payment. Eligibility and Benefit determinations will be made at the time of service.  Please note any additional information provided by [facility] at the bottom of the screen.

## 2020-02-26 ENCOUNTER — Ambulatory Visit: Payer: 59 | Admitting: Family Medicine

## 2020-03-01 ENCOUNTER — Encounter: Payer: Self-pay | Admitting: Family Medicine

## 2020-03-02 MED ORDER — TOPIRAMATE 25 MG PO TABS: 25.0000 mg | ORAL_TABLET | Freq: Two times a day (BID) | ORAL | 3 refills | Status: DC

## 2020-04-08 ENCOUNTER — Telehealth: Payer: Self-pay | Admitting: Family Medicine

## 2020-04-08 ENCOUNTER — Other Ambulatory Visit: Payer: Self-pay | Admitting: Family Medicine

## 2020-04-08 MED ORDER — NURTEC 75 MG PO TBDP: 75.0000 mg | ORAL_TABLET | Freq: Every day | ORAL | 11 refills | Status: DC | PRN

## 2020-04-08 MED ORDER — METOCLOPRAMIDE HCL 10 MG PO TABS: 10.0000 mg | ORAL_TABLET | Freq: Three times a day (TID) | ORAL | 0 refills | Status: DC | PRN

## 2020-04-08 NOTE — Telephone Encounter (Signed)
Pt called stating that she has had a Migraine for the past 3 days and she is feeling a pressure at the base of her skull and feeling nauseated something she has never experienced before. Pt would like to discuss with RN.

## 2020-04-08 NOTE — Telephone Encounter (Signed)
I called pt and relayed to her the recommendations of AL/NP and she was reassured MRI was normal 2020.  I refilled the  reglan for her as she did not have any.  I relayed the nurtec (saving card may be used) get online.  May require a PA.  She can use nurtec and not triptan same day.  She will let us know how she does.

## 2020-04-08 NOTE — Telephone Encounter (Signed)
Please let her know that I will call in a different abortive med. She can take Nurtec once daily as needed but avoid more than 10 doses a month. She can also take benadryl and metoclopramide if needed. As long as there are no worsening symptoms I am ok to just monitor. Migraines can occur in various locations. If she has significant worsening despite treatment and rest or any new or worrisome findings like slurred speech, sudden weakness or loss of vision, etc...she should be seen by UC or ER. I am reassured that MRI was normal in March 2020.

## 2020-04-08 NOTE — Telephone Encounter (Signed)
I called pt and she is having 3 day migraine, which started when she woke up on Monday.  Weather is trigger.  She has symptom that she states she has not had before pressure in base of skull, when more active notes more pressure and nausea.  No knot there, does not feel like has any tenseness.  Taking aimovig, topamax 25mg  po bid, rizatriptan, tizanidine 1/2 tab at night due to has 2 kids.  Level 6-7.  Please advise.  Has not had prednisone or depacon infusion.  Concerned due to back of skull pressure (new presentation).  Please advise.

## 2020-04-09 ENCOUNTER — Telehealth: Payer: Self-pay

## 2020-04-09 NOTE — Telephone Encounter (Signed)
Error

## 2020-04-09 NOTE — Telephone Encounter (Signed)
PA for Nurtec 75MG  dispersible tablets sent on [facility] Rx #: : [id number]

## 2020-04-14 NOTE — Telephone Encounter (Signed)
Received denial for NURTEC as pt has not tried 2 different triptans, (like sumatriptan or rizatriptan).  Please advise.

## 2020-04-15 ENCOUNTER — Telehealth: Payer: Self-pay

## 2020-04-15 NOTE — Telephone Encounter (Signed)
Approved [employer] [id number] 04-09-20 thru 07-01-20.  Fax confirmation to pharmacy.

## 2020-04-15 NOTE — Telephone Encounter (Addendum)
Spoke to pt verified she has tried and failed 2 triptans,(rizatriptan and imitrex ). Called [employer] to give additional information.  NURTEC APPROVED  nurtec approved for 12 weeks, if medication is beneficial  Can be approve for a longer period.  Pt aware.

## 2020-04-29 NOTE — Telephone Encounter (Signed)
Error

## 2020-05-07 ENCOUNTER — Telehealth: Payer: Self-pay | Admitting: *Deleted

## 2020-05-07 ENCOUNTER — Ambulatory Visit (INDEPENDENT_AMBULATORY_CARE_PROVIDER_SITE_OTHER): Payer: BLUE CROSS/BLUE SHIELD | Admitting: Obstetrics and Gynecology

## 2020-05-07 ENCOUNTER — Encounter: Payer: Self-pay | Admitting: Obstetrics and Gynecology

## 2020-05-07 ENCOUNTER — Inpatient Hospital Stay (HOSPITAL_COMMUNITY): Admit: 2020-05-07 | Payer: BLUE CROSS/BLUE SHIELD

## 2020-05-07 ENCOUNTER — Other Ambulatory Visit: Payer: Self-pay

## 2020-05-07 VITALS — BP 125/86 | HR 72 | Wt 220.0 lb

## 2020-05-07 DIAGNOSIS — R109 Unspecified abdominal pain: Secondary | ICD-10-CM

## 2020-05-07 DIAGNOSIS — R1032 Left lower quadrant pain: Secondary | ICD-10-CM | POA: Diagnosis not present

## 2020-05-07 LAB — POCT URINALYSIS DIPSTICK
Glucose, UA: NEGATIVE
Leukocytes, UA: NEGATIVE
Spec Grav, UA: 1.01 (ref 1.010–1.025)
pH, UA: 6 (ref 5.0–8.0)

## 2020-05-07 MED ORDER — IBUPROFEN 600 MG PO TABS: ORAL_TABLET | ORAL | 1 refills | Status: DC

## 2020-05-07 NOTE — Progress Notes (Signed)
Obstetrics and Gynecology Visit Return Patient Evaluation  Appointment Date: 05/07/2020  Primary Care Provider: No primary care provider on file.  OBGYN Clinic: [facility] for [facility]  Chief Complaint: LLQ pain starting yesterday  History of Present Illness:  ZI SEK is a 24 y.o. with above CC. She had a late 2019 TVH/BS for pelvic pain.  Ever since then, she states she's been doing well w/o pelvic pain but had intense LLQ pain starting yesterday. She does not have cyclic menstrual s/s. Pain is intense enough to make it difficult to walk. No GI s/s, vaginal itching, discharge, bleeding, lower urinary tract s/s.  Review of Systems: as noted in the History of Present Illness.  Medications:  Rayanna Matusik. [place] had no medications administered during this visit. Current Outpatient Medications  Medication Sig Dispense Refill  . FLUoxetine (PROZAC) 40 MG capsule Take 1 capsule (40 mg total) by mouth daily. (Patient taking differently: Take 20 mg by mouth daily. ) 90 capsule 3  . ondansetron (ZOFRAN) 4 MG tablet Take 1 tablet (4 mg total) by mouth every 8 (eight) hours as needed for nausea or vomiting. 20 tablet 1  . Rimegepant Sulfate (NURTEC) 75 MG TBDP Take 75 mg by mouth daily as needed (take for abortive therapy of migraine, no more than 1 tablet in 24 hours or 10 per month). 10 tablet 11  . rizatriptan (MAXALT) 10 MG tablet Take 1 tablet (10 mg total) by mouth as needed for migraine. May repeat in 2 hours if needed. Do exceed 2 tablets in 24 hours. 9 tablet 0  . tiZANidine (ZANAFLEX) 4 MG tablet Take 1 tablet (4 mg total) by mouth every 6 (six) hours as needed for muscle spasms. Or headaches. 30 tablet 0  . topiramate (TOPAMAX) 25 MG tablet Take 1 tablet (25 mg total) by mouth 2 (two) times daily. 180 tablet 3  . acetaminophen (TYLENOL 8 HOUR) 650 MG CR tablet Take 1 tablet (650 mg total) by mouth every 8 (eight) hours as needed. (Patient not taking: Reported on  05/07/2020) 30 tablet 0  . ALPRAZolam (XANAX) 0.5 MG tablet Take 1 tablet (0.5 mg total) by mouth daily as needed for anxiety. (Patient not taking: Reported on 05/07/2020) 20 tablet 0  . Erenumab-aooe (AIMOVIG) 140 MG/ML SOAJ Inject 140 mg into the skin every 30 (thirty) days. (Patient not taking: Reported on 05/07/2020) 1 pen 0  . ibuprofen (ADVIL) 600 MG tablet 600mg  by mouth every 6 hours for next two days. 30 tablet 1  . metoCLOPramide (REGLAN) 10 MG tablet Take 1 tablet (10 mg total) by mouth 3 (three) times daily as needed. For nausea or migraine. (Patient not taking: Reported on 05/07/2020) 10 tablet 0  . sucralfate (CARAFATE) 1 g tablet Take 1 tablet (1 g total) by mouth 4 (four) times daily -  with meals and at bedtime. (Patient not taking: Reported on 05/07/2020) 12 tablet 0   No current facility-administered medications for this visit.    Allergies: is allergic to percocet [oxycodone-acetaminophen]; cefuroxime axetil; and penicillins.  Physical Exam:  BP 125/86   Pulse 72   Wt 220 lb (99.8 kg)   LMP 07/15/2018 (Exact Date)   BMI 37.76 kg/m  Body mass index is 37.76 kg/m. General appearance: Well nourished, well developed female in no acute distress.  Abdomen: mildly ttp in llq inside of asis, non distended, and no masses, hernias Neuro/Psych:  Normal mood and affect.    Pelvic exam:  EGBUS, vaginal vault: normal  Cuff: normal, nttp Bimanual exam: negative   Assessment: pt stabld  Plan:  1. Left sided abdominal pain Likely ovarian cyst pain. Recommend OTCs, heating pad and if s/s dont improve and go away by July to let us know and I would recommend u/s. Pt amenable to plan.  - POCT urinalysis dipstick - Cervicovaginal ancillary only( [facility])  RTC: PRN  Durene Romans MD Attending [facility])

## 2020-05-07 NOTE — Telephone Encounter (Signed)
PA approved effective from 05/07/2020 through 05/07/2021.

## 2020-05-07 NOTE — Progress Notes (Signed)
Patient is here to discuss her left side pain that started 2 days ago.

## 2020-05-07 NOTE — Telephone Encounter (Signed)
Submitted PA Aimovig on CMM. Key: [id number]. Waiting on determination from [facility]

## 2020-05-08 LAB — CERVICOVAGINAL ANCILLARY ONLY
Bacterial Vaginitis (gardnerella): NEGATIVE
Candida Glabrata: NEGATIVE
Candida Vaginitis: NEGATIVE
Chlamydia: NEGATIVE
Comment: NEGATIVE
Comment: NEGATIVE
Comment: NEGATIVE
Comment: NEGATIVE
Comment: NEGATIVE
Comment: NORMAL
Neisseria Gonorrhea: NEGATIVE
Trichomonas: NEGATIVE

## 2020-06-05 ENCOUNTER — Emergency Department (HOSPITAL_BASED_OUTPATIENT_CLINIC_OR_DEPARTMENT_OTHER)
Admission: EM | Admit: 2020-06-05 | Discharge: 2020-06-05 | Disposition: A | Discharge status: Home / Self Care | Payer: BLUE CROSS/BLUE SHIELD | Attending: Emergency Medicine | Admitting: Emergency Medicine

## 2020-06-05 ENCOUNTER — Encounter (HOSPITAL_BASED_OUTPATIENT_CLINIC_OR_DEPARTMENT_OTHER): Payer: Self-pay | Admitting: *Deleted

## 2020-06-05 ENCOUNTER — Other Ambulatory Visit: Payer: Self-pay

## 2020-06-05 DIAGNOSIS — Z96652 Presence of left artificial knee joint: Secondary | ICD-10-CM | POA: Diagnosis not present

## 2020-06-05 DIAGNOSIS — G43901 Migraine, unspecified, not intractable, with status migrainosus: Secondary | ICD-10-CM | POA: Diagnosis not present

## 2020-06-05 DIAGNOSIS — R519 Headache, unspecified: Secondary | ICD-10-CM | POA: Diagnosis present

## 2020-06-05 MED ORDER — PROCHLORPERAZINE EDISYLATE 10 MG/2ML IJ SOLN
10.0000 mg | Freq: Once | INTRAMUSCULAR | Status: AC
  Administered 2020-06-05: 10 mg via INTRAVENOUS
  Filled 2020-06-05: qty 2

## 2020-06-05 MED ORDER — DEXAMETHASONE SODIUM PHOSPHATE 10 MG/ML IJ SOLN
10.0000 mg | Freq: Once | INTRAMUSCULAR | Status: AC
  Administered 2020-06-05: 10 mg via INTRAVENOUS
  Filled 2020-06-05: qty 1

## 2020-06-05 MED ORDER — DIPHENHYDRAMINE HCL 50 MG/ML IJ SOLN
25.0000 mg | Freq: Once | INTRAMUSCULAR | Status: AC
  Administered 2020-06-05: 25 mg via INTRAVENOUS
  Filled 2020-06-05: qty 1

## 2020-06-05 MED ORDER — SODIUM CHLORIDE 0.9 % IV BOLUS
1000.0000 mL | Freq: Once | INTRAVENOUS | Status: AC
  Administered 2020-06-05: 1000 mL via INTRAVENOUS

## 2020-06-05 NOTE — ED Triage Notes (Signed)
Migraine headache since Wednesday. 

## 2020-06-05 NOTE — ED Provider Notes (Signed)
[facility] EMERGENCY DEPARTMENT Provider Note   CSN: [id number] Arrival date & time: 06/05/20  6222     History Chief Complaint  Patient presents with  . Migraine    [name] Sanchez is a 24 y.o. female.  Patient is a 24 year old female with a history of endometriosis status post hysterectomy, migraines on Aimovig and multiple other migraine therapies who is presenting today with headache.  Patient reports for the last 2 to 3 weeks she has had a waxing and waning migraine.  She states she has tried multiple medications at home which usually abort the headache but this time it is not going away.  The last few days have been more severe.  It is on the left side of her face and her eye and radiates down to the back of her skull.  She reports her migraine symptoms are typically like this but there is more pressure in the back of her skull than usual.  She denies any fever, vomiting, unilateral numbness, weakness, visual changes or speech issues.  She does have nausea.  The history is provided by the patient.  Migraine This is a recurrent problem.       Past Medical History:  Diagnosis Date  . Anxiety   . Complication of anesthesia    wakes up crying  . Depression   . Endometriosis   . Family history of adverse reaction to anesthesia    hard to wake up, low heart rate  . Gestational hypertension 01/2012   also had postpartum preelampsia  . History of stomach ulcers   . Hx of ovarian cyst    x 2-3  . Hx of varicella   . IBS (irritable bowel syndrome)   . Migraine    chronic  . Panic attack   . Pinched nerve    left arm  . Plica of knee 08/2012   medial plica left knee    Patient Active Problem List   Diagnosis Date Noted  . BMI 37.0-37.9, adult 02/11/2019  . Swelling of lymph node 10/02/2018  . Seasonal affective disorder (HCC) 09/19/2018  . Urinary frequency 09/19/2018  . Status post hysterectomy 08/14/2018  . Acute post-traumatic headache, not intractable  07/30/2018  . Pelvic pain 07/31/2017  . Chronic migraine without aura without status migrainosus, not intractable 05/06/2016  . Depression with anxiety 04/04/2012  . Healthcare maintenance 04/01/2012  . Migraine with aura and without status migrainosus, not intractable     Past Surgical History:  Procedure Laterality Date  . KNEE ARTHROSCOPY  08/17/2012   Procedure: ARTHROSCOPY KNEE;  Surgeon: Harvie Junior, MD;  Location: [facility];  Service: Orthopedics;  Laterality: Left;  left knee scope with tight lateral retinacular release and plica removal  . TYMPANOSTOMY TUBE PLACEMENT Bilateral 1998  . VAGINAL HYSTERECTOMY Bilateral 08/14/2018   Procedure: HYSTERECTOMY VAGINAL WITH SALPINGECTOMY;  Surgeon: Reva Bores, MD;  Location: [facility];  Service: Gynecology;  Laterality: Bilateral;     OB History    Gravida  3   Para  2   Term  2   Preterm      AB  1   Living  2     SAB      TAB  1   Ectopic      Multiple      Live Births  2           Family History  Problem Relation Age of Onset  . Hypertension Mother   .  Heart disease Mother        MI  . Anesthesia problems Mother        post-op N/V  . Depression Mother   . Anxiety disorder Mother   . Obesity Mother   . Congestive Heart Failure Mother   . Asthma Sister   . Bipolar disorder Sister   . Asthma Brother   . Diabetes Maternal Aunt   . Heart disease Father   . Bipolar disorder Father   . Hyperlipidemia Father   . Uterine cancer Maternal Grandmother   . Pancreatic cancer Maternal Grandmother   . Lung cancer Maternal Grandfather     Social History   Tobacco Use  . Smoking status: Never Smoker  . Smokeless tobacco: Never Used  Vaping Use  . Vaping Use: Never used  Substance Use Topics  . Alcohol use: Not Currently    Alcohol/week: 0.0 standard drinks    Comment: occassionally  . Drug use: No    Home Medications Prior to Admission medications   Medication Sig Start Date End  Date Taking? Authorizing Provider  Erenumab-aooe (AIMOVIG) 140 MG/ML SOAJ Inject 140 mg into the skin every 30 (thirty) days. 01/08/20  Yes Lomax, Amy, NP  FLUoxetine (PROZAC) 40 MG capsule Take 1 capsule (40 mg total) by mouth daily. Patient taking differently: Take 20 mg by mouth daily.  02/11/19  Yes Danford, Katy D, NP  ibuprofen (ADVIL) 600 MG tablet 600mg  by mouth every 6 hours for next two days. 05/07/20  Yes Biggs BingPickens, Charlie, MD  metoCLOPramide (REGLAN) 10 MG tablet Take 1 tablet (10 mg total) by mouth 3 (three) times daily as needed. For nausea or migraine. 04/08/20  Yes Lomax, Amy, NP  tiZANidine (ZANAFLEX) 4 MG tablet Take 1 tablet (4 mg total) by mouth every 6 (six) hours as needed for muscle spasms. Or headaches. 01/08/20  Yes Lomax, Amy, NP  topiramate (TOPAMAX) 25 MG tablet Take 1 tablet (25 mg total) by mouth 2 (two) times daily. 03/02/20  Yes Lomax, Amy, NP  acetaminophen (TYLENOL 8 HOUR) 650 MG CR tablet Take 1 tablet (650 mg total) by mouth every 8 (eight) hours as needed. Patient not taking: Reported on 05/07/2020 04/17/18   Derwood KaplanNanavati, Ankit, MD  ALPRAZolam Prudy Feeler(XANAX) 0.5 MG tablet Take 1 tablet (0.5 mg total) by mouth daily as needed for anxiety. Patient not taking: Reported on 05/07/2020 11/12/18   William Hamburgeranford, Katy D, NP  Rimegepant Sulfate (NURTEC) 75 MG TBDP Take 75 mg by mouth daily as needed (take for abortive therapy of migraine, no more than 1 tablet in 24 hours or 10 per month). 04/08/20   Lomax, Amy, NP  rizatriptan (MAXALT) 10 MG tablet Take 1 tablet (10 mg total) by mouth as needed for migraine. May repeat in 2 hours if needed. Do exceed 2 tablets in 24 hours. 01/08/20   Lomax, Amy, NP  promethazine (PHENERGAN) 25 MG suppository Place 1 suppository (25 mg total) rectally every 6 (six) hours as needed for nausea or vomiting. 08/18/18 12/08/19  Rasch, Harolyn RutherfordJennifer I, NP    Allergies    Percocet [oxycodone-acetaminophen], Cefuroxime axetil, and Penicillins  Review of Systems   Review of Systems  All  other systems reviewed and are negative.   Physical Exam Updated Vital Signs BP 111/75 (BP Location: Right Arm)   Pulse 74   Temp 98.4 F (36.9 C) (Oral)   Resp 18   Ht 5\' 4"  (1.626 m)   Wt 97.5 kg   LMP 07/15/2018 (Exact Date)  SpO2 100%   BMI 36.90 kg/m   Physical Exam Vitals and nursing note reviewed.  Constitutional:      General: She is not in acute distress.    Appearance: Normal appearance. She is well-developed. She is obese.  HENT:     Head: Normocephalic and atraumatic.     Mouth/Throat:     Mouth: Mucous membranes are moist.  Eyes:     Extraocular Movements: Extraocular movements intact.     Conjunctiva/sclera: Conjunctivae normal.     Pupils: Pupils are equal, round, and reactive to light.     Funduscopic exam:    Right eye: No papilledema.        Left eye: No papilledema.  Cardiovascular:     Rate and Rhythm: Normal rate and regular rhythm.     Heart sounds: Normal heart sounds. No murmur heard.  No friction rub.  Pulmonary:     Effort: Pulmonary effort is normal.     Breath sounds: Normal breath sounds. No wheezing or rales.  Abdominal:     General: Bowel sounds are normal. There is no distension.     Palpations: Abdomen is soft.     Tenderness: There is no abdominal tenderness. There is no guarding or rebound.  Musculoskeletal:        General: No tenderness. Normal range of motion.     Cervical back: Normal range of motion and neck supple.     Comments: No edema  Lymphadenopathy:     Cervical: No cervical adenopathy.  Skin:    General: Skin is warm and dry.     Findings: No rash.  Neurological:     Mental Status: She is alert and oriented to person, place, and time. Mental status is at baseline.     Cranial Nerves: No cranial nerve deficit.     Sensory: No sensory deficit.     Motor: No weakness.     Coordination: Coordination normal.     Gait: Gait normal.     Comments: photophobia  Psychiatric:        Mood and Affect: Mood normal.         Behavior: Behavior normal.        Thought Content: Thought content normal.     ED Results / Procedures / Treatments   Labs (all labs ordered are listed, but only abnormal results are displayed) Labs Reviewed - No data to display  EKG None  Radiology No results found.  Procedures Procedures (including critical care time)  Medications Ordered in ED Medications  dexamethasone (DECADRON) injection 10 mg (has no administration in time range)  diphenhydrAMINE (BENADRYL) injection 25 mg (has no administration in time range)  prochlorperazine (COMPAZINE) injection 10 mg (has no administration in time range)  sodium chloride 0.9 % bolus 1,000 mL (has no administration in time range)    ED Course  I have reviewed the triage vital signs and the nursing notes.  Pertinent labs & imaging results that were available during my care of the patient were reviewed by me and considered in my medical decision making (see chart for details).    MDM Rules/Calculators/A&P                          Pt with typical migraine HA without sx suggestive of SAH(sudden onset, worst of life, or deficits), infection, or cavernous vein thrombosis.  Normal neuro exam and vital signs. Will give HA cocktail and re-eval.  10:37 AM Pt's  headache resolved and will d/c home.  Final Clinical Impression(s) / ED Diagnoses Final diagnoses:  Migraine with status migrainosus, not intractable, unspecified migraine type    Rx / DC Orders ED Discharge Orders    None       Gwyneth Sprout, MD 06/05/20 1038

## 2020-06-19 IMAGING — DX DG RIBS W/ CHEST 3+V*L*
3 series · 3 of 3 positions shown · non-contrast
Comparison: Chest x-ray dated 04/17/2018.

CLINICAL DATA: Chronic swelling over the LEFT ribs, pain increased
over the past week. Pain increases with inspiration. No injury.

EXAM:
LEFT RIBS AND CHEST - 3+ VIEW

[chest pa]
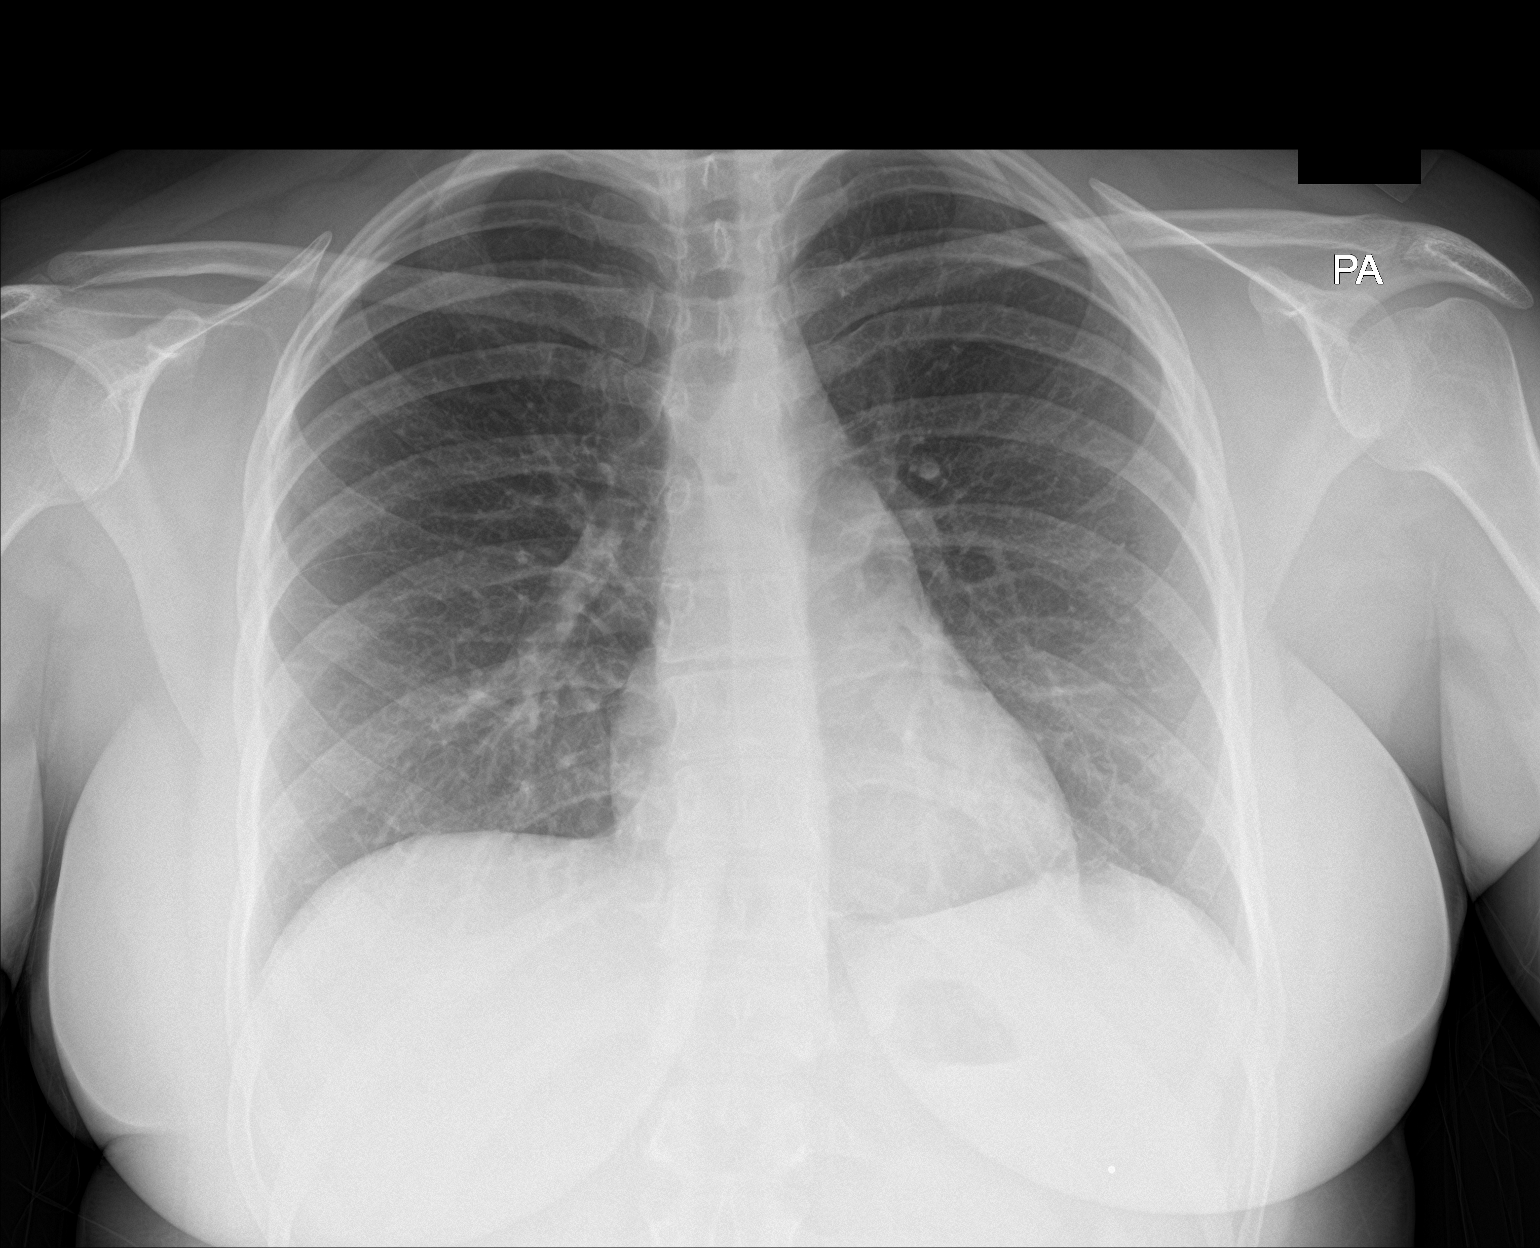

[rib obl]
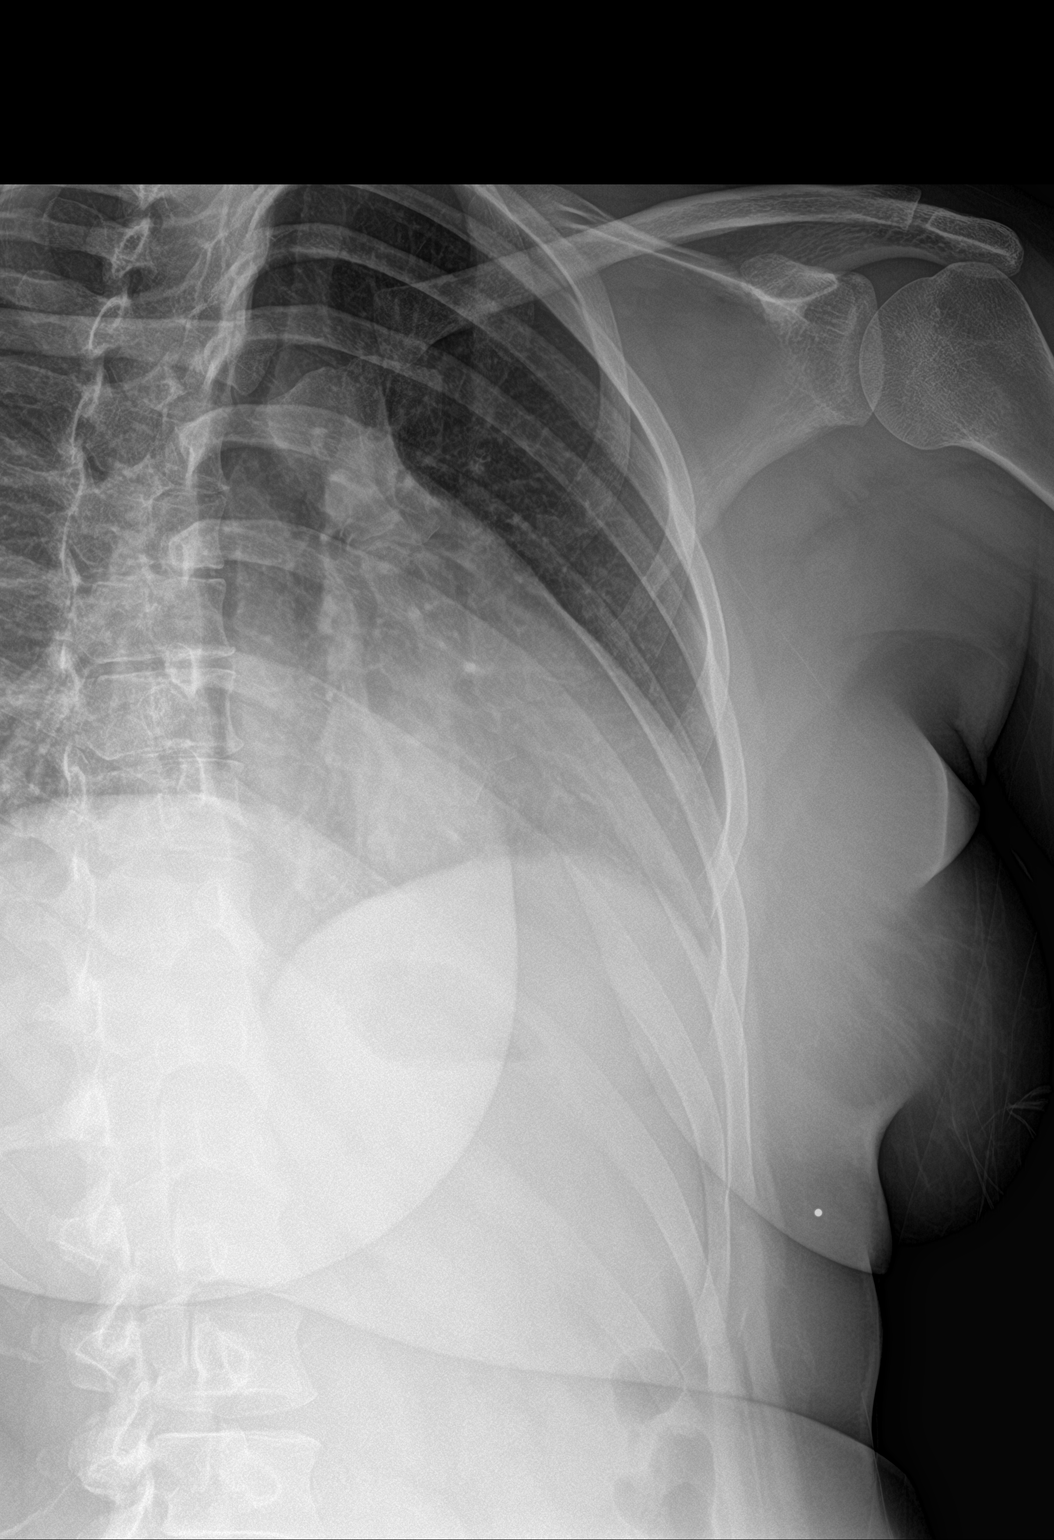

[rib pa]
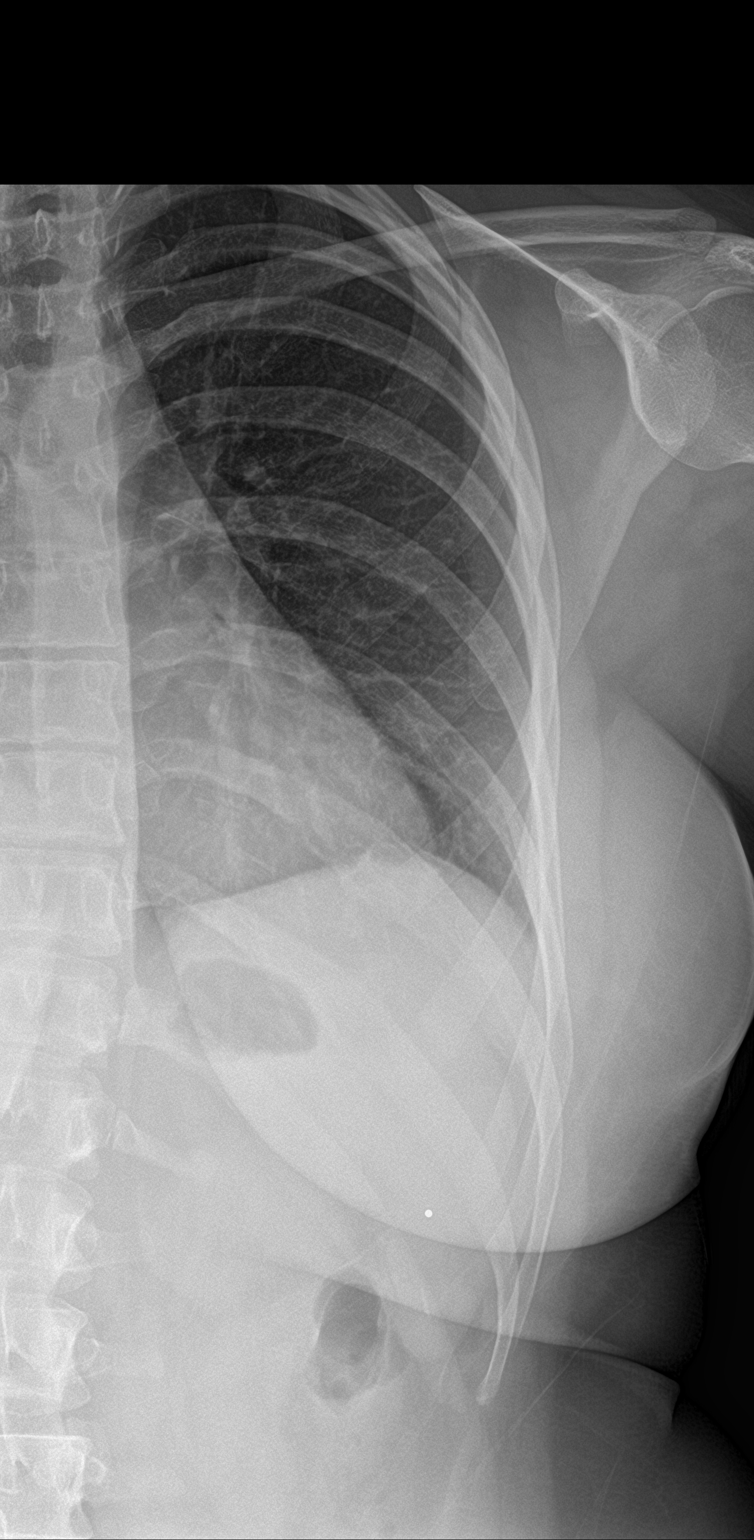

[3 of 3 positions shown; findings below may reference images not displayed]

FINDINGS: Single-view of the chest and two views of the LEFT ribs are
provided. Heart size and mediastinal contours are within normal
limits. Lungs are clear. No pleural effusion or pneumothorax seen.

Osseous structures about the chest are unremarkable. No LEFT-sided
rib fracture or displacement. No acute or suspicious osseous lesion.
Soft tissues about the chest are unremarkable.
IMPRESSION: Negative.

## 2020-07-09 ENCOUNTER — Ambulatory Visit: Payer: BLUE CROSS/BLUE SHIELD | Admitting: Family Medicine

## 2020-08-11 ENCOUNTER — Telehealth: Payer: Self-pay

## 2020-08-11 NOTE — Telephone Encounter (Signed)
[name] Sanchez (Key: [id number]) Rx #: [id number] Nurtec 75MG  dispersible tablets  A PA has been sent via CMM  Pending

## 2020-08-13 ENCOUNTER — Telehealth: Payer: Self-pay | Admitting: Unknown Physician Specialty

## 2020-08-13 NOTE — Telephone Encounter (Signed)
Called to Discuss with patient about Covid symptoms and the use of the monoclonal antibody infusion for those with mild to moderate Covid symptoms and at a high risk of hospitalization.     Pt appears to qualify for this infusion due to co-morbid conditions and/or a member of an at-risk group in accordance with the FDA Emergency Use Authorization.    Unable to reach pt    

## 2020-09-10 ENCOUNTER — Ambulatory Visit
Admission: EM | Admit: 2020-09-10 | Discharge: 2020-09-10 | Disposition: A | Discharge status: Home / Self Care | Payer: BLUE CROSS/BLUE SHIELD | Attending: Emergency Medicine | Admitting: Emergency Medicine

## 2020-09-10 ENCOUNTER — Other Ambulatory Visit: Payer: Self-pay

## 2020-09-10 ENCOUNTER — Ambulatory Visit (INDEPENDENT_AMBULATORY_CARE_PROVIDER_SITE_OTHER): Payer: BLUE CROSS/BLUE SHIELD

## 2020-09-10 DIAGNOSIS — J22 Unspecified acute lower respiratory infection: Secondary | ICD-10-CM | POA: Diagnosis not present

## 2020-09-10 DIAGNOSIS — R059 Cough, unspecified: Secondary | ICD-10-CM

## 2020-09-10 MED ORDER — ALBUTEROL SULFATE HFA 108 (90 BASE) MCG/ACT IN AERS: 1.0000 | INHALATION_SPRAY | Freq: Four times a day (QID) | RESPIRATORY_TRACT | 0 refills | Status: DC | PRN

## 2020-09-10 MED ORDER — DM-GUAIFENESIN ER 30-600 MG PO TB12
1.0000 | ORAL_TABLET | Freq: Two times a day (BID) | ORAL | 0 refills | Status: DC
Start: 2020-09-10 — End: 2020-09-29

## 2020-09-10 MED ORDER — BENZONATATE 200 MG PO CAPS
200.0000 mg | ORAL_CAPSULE | Freq: Three times a day (TID) | ORAL | 0 refills | Status: AC | PRN
Start: 2020-09-10 — End: 2020-09-17

## 2020-09-10 MED ORDER — GUAIFENESIN-CODEINE 100-10 MG/5ML PO SOLN: 10.0000 mL | Freq: Three times a day (TID) | ORAL | 0 refills | Status: DC | PRN

## 2020-09-10 NOTE — Discharge Instructions (Signed)
Chest xray normal Continue course of Levaquin, prednisone Continue albuterol inhaler as needed, I sent a refill if needed Please use benzonatate/Tessalon for cough during the day Robitussin-AC for persistent/nighttime cough Sinex DM to further help with congestion and cough Honey/lemon/ginger and hot tea Rest and fluids Follow-up if not improving or worsening

## 2020-09-10 NOTE — ED Provider Notes (Signed)
[facility]    CSN: [id number] Arrival date & time: 09/10/20  6759      History   Chief Complaint Chief Complaint  Patient presents with  . Cough    x 1 month  . Nasal Congestion    x 1 month    HPI [name] Sanchez is a 24 y.o. female presenting today for evaluation of a cough.  Patient reports approximately 1 month ago she tested positive for Covid and is on a round of antibiotics and steroids.  Symptoms did improve, but returned again approximately 2 weeks ago.  She has had persistent cough congestion and some shortness of breath.  Feels as if her airway is very small.  She was seen by her primary doctor 2 days ago and was started on Levaquin 750 daily, given a steroid injection prior to discharge and continues on 6-day prednisone taper.  She improved for 2 days, but yesterday symptoms worsened again.  She reports continued cough, shortness of breath, soreness in her back.  Uses albuterol inhaler as well, which does help breathing but only for approximately 1 hour.  Denies history of any lung problems, asthma, smoking.    HPI  Past Medical History:  Diagnosis Date  . Anxiety   . Complication of anesthesia    wakes up crying  . Depression   . Endometriosis   . Family history of adverse reaction to anesthesia    hard to wake up, low heart rate  . Gestational hypertension 01/2012   also had postpartum preelampsia  . History of stomach ulcers   . Hx of ovarian cyst    x 2-3  . Hx of varicella   . IBS (irritable bowel syndrome)   . Migraine    chronic  . Panic attack   . Pinched nerve    left arm  . Plica of knee 08/2012   medial plica left knee    Patient Active Problem List   Diagnosis Date Noted  . BMI 37.0-37.9, adult 02/11/2019  . Swelling of lymph node 10/02/2018  . Seasonal affective disorder (HCC) 09/19/2018  . Urinary frequency 09/19/2018  . Status post hysterectomy 08/14/2018  . Acute post-traumatic headache, not intractable 07/30/2018  .  Pelvic pain 07/31/2017  . Chronic migraine without aura without status migrainosus, not intractable 05/06/2016  . Depression with anxiety 04/04/2012  . Healthcare maintenance 04/01/2012  . Migraine with aura and without status migrainosus, not intractable     Past Surgical History:  Procedure Laterality Date  . KNEE ARTHROSCOPY  08/17/2012   Procedure: ARTHROSCOPY KNEE;  Surgeon: Harvie Junior, MD;  Location: [facility];  Service: Orthopedics;  Laterality: Left;  left knee scope with tight lateral retinacular release and plica removal  . TYMPANOSTOMY TUBE PLACEMENT Bilateral 1998  . VAGINAL HYSTERECTOMY Bilateral 08/14/2018   Procedure: HYSTERECTOMY VAGINAL WITH SALPINGECTOMY;  Surgeon: Reva Bores, MD;  Location: [facility];  Service: Gynecology;  Laterality: Bilateral;    OB History    Gravida  3   Para  2   Term  2   Preterm      AB  1   Living  2     SAB      TAB  1   Ectopic      Multiple      Live Births  2            Home Medications    Prior to Admission medications   Medication Sig  Start Date End Date Taking? Authorizing Provider  acetaminophen (TYLENOL 8 HOUR) 650 MG CR tablet Take 1 tablet (650 mg total) by mouth every 8 (eight) hours as needed. 04/17/18  Yes Nanavati, Ankit, MD  Erenumab-aooe (AIMOVIG) 140 MG/ML SOAJ Inject 140 mg into the skin every 30 (thirty) days. 01/08/20  Yes Lomax, Amy, NP  FLUoxetine (PROZAC) 40 MG capsule Take 1 capsule (40 mg total) by mouth daily. Patient taking differently: Take 20 mg by mouth daily.  02/11/19  Yes Danford, Katy D, NP  ibuprofen (ADVIL) 600 MG tablet 600mg  by mouth every 6 hours for next two days. 05/07/20  Yes 07/07/20, MD  Rimegepant Sulfate (NURTEC) 75 MG TBDP Take 75 mg by mouth daily as needed (take for abortive therapy of migraine, no more than 1 tablet in 24 hours or 10 per month). 04/08/20  Yes Lomax, Amy, NP  topiramate (TOPAMAX) 25 MG tablet Take 1 tablet (25 mg total) by mouth 2  (two) times daily. 03/02/20  Yes Lomax, Amy, NP  albuterol (VENTOLIN HFA) 108 (90 Base) MCG/ACT inhaler Inhale 1-2 puffs into the lungs every 6 (six) hours as needed for wheezing or shortness of breath. 09/10/20   Haruna Rohlfs C, PA-C  ALPRAZolam (XANAX) 0.5 MG tablet Take 1 tablet (0.5 mg total) by mouth daily as needed for anxiety. Patient not taking: Reported on 05/07/2020 11/12/18   14/9/19 D, NP  benzonatate (TESSALON) 200 MG capsule Take 1 capsule (200 mg total) by mouth 3 (three) times daily as needed for up to 7 days for cough. 09/10/20 09/17/20  Mekhia Brogan C, PA-C  dextromethorphan-guaiFENesin (MUCINEX DM) 30-600 MG 12hr tablet Take 1 tablet by mouth 2 (two) times daily. 09/10/20   Michiah Mudry C, PA-C  guaiFENesin-codeine 100-10 MG/5ML syrup Take 10 mLs by mouth 3 (three) times daily as needed for cough. 09/10/20   Marshall Kampf C, PA-C  metoCLOPramide (REGLAN) 10 MG tablet Take 1 tablet (10 mg total) by mouth 3 (three) times daily as needed. For nausea or migraine. 04/08/20   Lomax, Amy, NP  rizatriptan (MAXALT) 10 MG tablet Take 1 tablet (10 mg total) by mouth as needed for migraine. May repeat in 2 hours if needed. Do exceed 2 tablets in 24 hours. 01/08/20   Lomax, Amy, NP  tiZANidine (ZANAFLEX) 4 MG tablet Take 1 tablet (4 mg total) by mouth every 6 (six) hours as needed for muscle spasms. Or headaches. 01/08/20   Lomax, Amy, NP  promethazine (PHENERGAN) 25 MG suppository Place 1 suppository (25 mg total) rectally every 6 (six) hours as needed for nausea or vomiting. 08/18/18 12/08/19  Rasch, 02/05/20, NP    Family History Family History  Problem Relation Age of Onset  . Hypertension Mother   . Heart disease Mother        MI  . Anesthesia problems Mother        post-op N/V  . Depression Mother   . Anxiety disorder Mother   . Obesity Mother   . Congestive Heart Failure Mother   . Asthma Sister   . Bipolar disorder Sister   . Asthma Brother   . Diabetes Maternal Aunt   .  Heart disease Father   . Bipolar disorder Father   . Hyperlipidemia Father   . Uterine cancer Maternal Grandmother   . Pancreatic cancer Maternal Grandmother   . Lung cancer Maternal Grandfather     Social History Social History   Tobacco Use  . Smoking status: Never Smoker  .  Smokeless tobacco: Never Used  Vaping Use  . Vaping Use: Never used  Substance Use Topics  . Alcohol use: Not Currently    Alcohol/week: 0.0 standard drinks    Comment: occassionally  . Drug use: No     Allergies   Percocet [oxycodone-acetaminophen], Cefuroxime axetil, and Penicillins   Review of Systems Review of Systems  Constitutional: Negative for activity change, appetite change, chills, fatigue and fever.  HENT: Positive for congestion, rhinorrhea and sore throat. Negative for ear pain, sinus pressure and trouble swallowing.   Eyes: Negative for discharge and redness.  Respiratory: Positive for cough and shortness of breath. Negative for chest tightness.   Cardiovascular: Negative for chest pain.  Gastrointestinal: Negative for abdominal pain, diarrhea, nausea and vomiting.  Musculoskeletal: Positive for back pain and myalgias.  Skin: Negative for rash.  Neurological: Negative for dizziness, light-headedness and headaches.     Physical Exam Triage Vital Signs ED Triage Vitals  Enc Vitals Group     BP      Pulse      Resp      Temp      Temp src      SpO2      Weight      Height      Head Circumference      Peak Flow      Pain Score      Pain Loc      Pain Edu?      Excl. in GC?    No data found.  Updated Vital Signs BP 109/73 (BP Location: Left Arm)   Pulse 91   Temp 98.3 F (36.8 C) (Oral)   Resp 18   LMP 07/15/2018 (Exact Date)   SpO2 98%   Visual Acuity Right Eye Distance:   Left Eye Distance:   Bilateral Distance:    Right Eye Near:   Left Eye Near:    Bilateral Near:     Physical Exam Vitals and nursing note reviewed.  Constitutional:       Appearance: She is well-developed.     Comments: No acute distress  HENT:     Head: Normocephalic and atraumatic.     Ears:     Comments: Bilateral ears without tenderness to palpation of external auricle, tragus and mastoid, EAC's without erythema or swelling, TM's with good bony landmarks and cone of light. Non erythematous.     Nose: Nose normal.     Mouth/Throat:     Comments: Oral mucosa pink and moist, no tonsillar enlargement or exudate. Posterior pharynx patent and nonerythematous, no uvula deviation or swelling. Normal phonation. Eyes:     Conjunctiva/sclera: Conjunctivae normal.  Cardiovascular:     Rate and Rhythm: Normal rate.  Pulmonary:     Effort: Pulmonary effort is normal. No respiratory distress.     Comments: Breathing comfortably at rest, CTABL, no wheezing, rales or other adventitious sounds auscultated  Frequent coarse coughing in room Abdominal:     General: There is no distension.  Musculoskeletal:        General: Normal range of motion.     Cervical back: Neck supple.  Skin:    General: Skin is warm and dry.  Neurological:     Mental Status: She is alert and oriented to person, place, and time.      UC Treatments / Results  Labs (all labs ordered are listed, but only abnormal results are displayed) Labs Reviewed - No data to display  EKG  Radiology DG Chest 2 View  Result Date: 09/10/2020 CLINICAL DATA:  Cough EXAM: CHEST - 2 VIEW COMPARISON:  May 26, 2020 FINDINGS: The cardiomediastinal silhouette is normal in contour. No pleural effusion. No pneumothorax. No acute pleuroparenchymal abnormality. Visualized abdomen is unremarkable. No acute osseous abnormality noted. IMPRESSION: No acute cardiopulmonary abnormality. Electronically Signed   By: Meda Klinefelter MD   On: 09/10/2020 09:25    Procedures Procedures (including critical care time)  Medications Ordered in UC Medications - No data to display  Initial Impression / Assessment and  Plan / UC Course  I have reviewed the triage vital signs and the nursing notes.  Pertinent labs & imaging results that were available during my care of the patient were reviewed by me and considered in my medical decision making (see chart for details).     Chest x-ray normal, suspect likely persistent postviral cough, continue prednisone course, Levaquin and albuterol inhaler as needed.  Adding in stronger cough medicine to further help with cough.  Robitussin-AC, has allergy to oxycodone, will try codeine, if having reaction advised to stop and contact us immediately.  Tessalon and Mucinex DM for during the day.  Discussed strict return precautions. Patient verbalized understanding and is agreeable with plan.  Final Clinical Impressions(s) / UC Diagnoses   Final diagnoses:  Cough  Lower respiratory infection (e.g., bronchitis, pneumonia, pneumonitis, pulmonitis)     Discharge Instructions     Chest xray normal Continue course of Levaquin, prednisone Continue albuterol inhaler as needed, I sent a refill if needed Please use benzonatate/Tessalon for cough during the day Robitussin-AC for persistent/nighttime cough Sinex DM to further help with congestion and cough Honey/lemon/ginger and hot tea Rest and fluids Follow-up if not improving or worsening    ED Prescriptions    Medication Sig Dispense Auth. Provider   albuterol (VENTOLIN HFA) 108 (90 Base) MCG/ACT inhaler Inhale 1-2 puffs into the lungs every 6 (six) hours as needed for wheezing or shortness of breath. 18 g Guerin Lashomb C, PA-C   benzonatate (TESSALON) 200 MG capsule Take 1 capsule (200 mg total) by mouth 3 (three) times daily as needed for up to 7 days for cough. 28 capsule Jiselle Sheu C, PA-C   guaiFENesin-codeine 100-10 MG/5ML syrup Take 10 mLs by mouth 3 (three) times daily as needed for cough. 120 mL Palma Buster C, PA-C   dextromethorphan-guaiFENesin (MUCINEX DM) 30-600 MG 12hr tablet Take 1 tablet by  mouth 2 (two) times daily. 20 tablet Elo Marmolejos, [place] C, PA-C     PDMP not reviewed this encounter.   Lew Dawes, [place] 09/10/20 [phone]

## 2020-09-10 NOTE — ED Triage Notes (Signed)
PT states she had a positive Covid test around labor day and the symptoms went away but have returned about 2 weeks ago. Pt is aox4 and ambulatory.

## 2020-09-28 NOTE — Progress Notes (Addendum)
**Note [name]-Identified via Obfuscation** Chief Complaint  Patient presents with  . Follow-up    room 2, alone, migraine fu, Pt states migraines worse since she has had covid 19 in 08/2020, reports daily headaches      HISTORY OF PRESENT ILLNESS: Today 09/29/20  [name] Sanchez is a 24 y.o. female here today for follow up for migraines. We restarted Amovig in 01/2020. She called in 02/2020 with continued migraines. We started topiramate 25mg  BID. Rizatriptan was not effective and switched to Nurtec. She was doing well until last month when she was diagnosed with Covid. Since, she has had constant headaches. She has had to take more OTC medications to treat migraines. Nurtec does still help. Reglan helps with nausea. She takes 1/2 tablet of tizanidine from time to time that helps.    HISTORY (copied from my note on 01/08/2020)  [name] Sanchez is a 24 y.o. female here today for follow up for migraines. She was doing well on Amovig but lost her insurance in November. Last injection was in October. Since, headaches have returned. She was seen in the ER recently due to intractable migraine. She does have white flashing aura before migraine. She is not on birth control as she is s/p hysterectomy. Rizatriptan, tizanidine and Reglan help to abort headache. She rarely uses these medications when taking Amovig regularly.  She does have seasonal allergies and vertigo symptoms from time to time. Xyzal helps.   HISTORY: (copied from my note on 02/11/2019)  04/13/2019 [name] Sanchez a 24 y.o.femalehere today for follow up on migraines. She was doing well on Amovig and unfortunately had a lapse in insurance causing her to be without medication for about 3 months. She just recently got Amovig approved again. She has had more frequent and severe migraines. Unusally unilateral with radiation to the back of her head. She is very nauseous and sensitive to light and sounds. She was recently given metoclopramide that has helped some.She complains of a pressure  sensation behind the left eye that radiates to the back of her head. She has throbbing with nausea and vomiting. Light and sound make it worse. Sleep makes it better. She is currently being treated for a sinus infection. She has been on a total of three antibiotics. She does not feel that the antibiotics are helping. She is taking metoclopramide occasionally. Last dose was last night. She has not taken Maxalt in about 2 weeks. She took her last dose of Zofran last night.   She had an MRI ordered in September. She has not had this done yet.   Last eye exam July 2019.   HISTORY: (copied fromDr Ahern'snote on 08/08/2018) 10/08/2018 [name] Sanchez a 24 y.o.femalehere as requested by Dr. SWN:IOEVOJ chronic migraines.Past medical history of migraine, depression, anxiety. Migraines since a child. No Fhx of migraies that she knows of. Migraines really started in HS. Seen by Neuro Dr. Sherre Poot and here Dr. Sherryll Burger in [place] and tried nerve blocks, topamax, trigger point injections, sphenopalatine ganglion block. Triptans made it worse. Also tried Amitriptyline. She had an amazing response to Aimovig will reorder. Daily headaches with superimposed migraines 15 migraine days a month, severe, worsening, last 1-2 days, pounding/pulsating/throbbing, +light sensitivity/ noise sensitivity, nausea has vomited movement akes it worse. She has black and white spots of aura.   Reviewed notes, labs and imaging from outside physicians, which showed:  Reviewed notes from [facility]. Patient was first seen in August of this year as a new patient. Patient reported migraine with aura  estimated to have 2-3 headaches per week she [name] but is coming to us because of health insurance coverage she is not currently on any migraine Rx. She also has chronic anxiety and depression. She works as a LawyerCNA at [employer]. Exam was unremarkable.  Reviewed emergency room notes from  August 03, 2018 for progressively worsening left-sided headache the same as previous migraines with associated nausea vomiting photophobia and phonophobia [place]. Not on any preventative or abortive therapies. She is been going to urgent care for IM Toradol and Phenergan when her headaches get bad. She also fell down the stairs 1 week prior and hit her head with intermittent blurred vision and an increase in her migraine since the falls.  Reviewed CT scan report 08/03/2018:IMPRESSION: Limited evaluation of the brain due to severe motion artifact and nondiagnostic study of the lower portion of the brain and base of the skull. No acute intracranial pathology identified in the upper portion of the brain. If there is high clinical concern for acute intracranial pathology, repeat CT is recommended.     REVIEW OF SYSTEMS: Out of a complete 14 system review of symptoms, the patient complains only of the following symptoms, headaches, nausea, and all other reviewed systems are negative.   ALLERGIES: Allergies  Allergen Reactions  . Percocet [Oxycodone-Acetaminophen] Hives and Itching  . Cefuroxime Axetil Rash  . Penicillins Rash     HOME MEDICATIONS: Outpatient Medications Prior to Visit  Medication Sig Dispense Refill  . ALPRAZolam (XANAX) 0.5 MG tablet Take 1 tablet (0.5 mg total) by mouth daily as needed for anxiety. 20 tablet 0  . Erenumab-aooe (AIMOVIG) 140 MG/ML SOAJ Inject 140 mg into the skin every 30 (thirty) days. 1 pen 0  . FLUoxetine (PROZAC) 40 MG capsule Take 1 capsule (40 mg total) by mouth daily. (Patient taking differently: Take 20 mg by mouth daily. ) 90 capsule 3  . metoCLOPramide (REGLAN) 10 MG tablet Take 1 tablet (10 mg total) by mouth 3 (three) times daily as needed. For nausea or migraine. 10 tablet 0  . Rimegepant Sulfate (NURTEC) 75 MG TBDP Take 75 mg by mouth daily as needed (take for abortive therapy of migraine, no  more than 1 tablet in 24 hours or 10 per month). 10 tablet 11  . rizatriptan (MAXALT) 10 MG tablet Take 1 tablet (10 mg total) by mouth as needed for migraine. May repeat in 2 hours if needed. Do exceed 2 tablets in 24 hours. 9 tablet 0  . tiZANidine (ZANAFLEX) 4 MG tablet Take 1 tablet (4 mg total) by mouth every 6 (six) hours as needed for muscle spasms. Or headaches. 30 tablet 0  . topiramate (TOPAMAX) 25 MG tablet Take 1 tablet (25 mg total) by mouth 2 (two) times daily. 180 tablet 3  . acetaminophen (TYLENOL 8 HOUR) 650 MG CR tablet Take 1 tablet (650 mg total) by mouth every 8 (eight) hours as needed. 30 tablet 0  . albuterol (VENTOLIN HFA) 108 (90 Base) MCG/ACT inhaler Inhale 1-2 puffs into the lungs every 6 (six) hours as needed for wheezing or shortness of breath. 18 g 0  . dextromethorphan-guaiFENesin (MUCINEX DM) 30-600 MG 12hr tablet Take 1 tablet by mouth 2 (two) times daily. 20 tablet 0  . guaiFENesin-codeine 100-10 MG/5ML syrup Take 10 mLs by mouth 3 (three) times daily as needed for cough. 120 mL 0  . ibuprofen (ADVIL) 600 MG tablet 600mg  by mouth  every 6 hours for next two days. 30 tablet 1   No facility-administered medications prior to visit.     PAST MEDICAL HISTORY: Past Medical History:  Diagnosis Date  . Anxiety   . Complication of anesthesia    wakes up crying  . Depression   . Endometriosis   . Family history of adverse reaction to anesthesia    hard to wake up, low heart rate  . Gestational hypertension 01/2012   also had postpartum preelampsia  . History of stomach ulcers   . Hx of ovarian cyst    x 2-3  . Hx of varicella   . IBS (irritable bowel syndrome)   . Migraine    chronic  . Panic attack   . Pinched nerve    left arm  . Plica of knee 08/2012   medial plica left knee     PAST SURGICAL HISTORY: Past Surgical History:  Procedure Laterality Date  . KNEE ARTHROSCOPY  08/17/2012   Procedure: ARTHROSCOPY KNEE;  Surgeon: Harvie Junior, MD;   Location: [facility];  Service: Orthopedics;  Laterality: Left;  left knee scope with tight lateral retinacular release and plica removal  . TYMPANOSTOMY TUBE PLACEMENT Bilateral 1998  . VAGINAL HYSTERECTOMY Bilateral 08/14/2018   Procedure: HYSTERECTOMY VAGINAL WITH SALPINGECTOMY;  Surgeon: Reva Bores, MD;  Location: [facility];  Service: Gynecology;  Laterality: Bilateral;     FAMILY HISTORY: Family History  Problem Relation Age of Onset  . Hypertension Mother   . Heart disease Mother        MI  . Anesthesia problems Mother        post-op [name]/V  . Depression Mother   . Anxiety disorder Mother   . Obesity Mother   . Congestive Heart Failure Mother   . Asthma Sister   . Bipolar disorder Sister   . Asthma Brother   . Diabetes Maternal Aunt   . Heart disease Father   . Bipolar disorder Father   . Hyperlipidemia Father   . Uterine cancer Maternal Grandmother   . Pancreatic cancer Maternal Grandmother   . Lung cancer Maternal Grandfather      SOCIAL HISTORY: Social History   Socioeconomic History  . Marital status: Married    Spouse name: Will  . Number of children: 2  . Years of education: Not on file  . Highest education level: Some college, no degree  Occupational History  . Occupation: Furniture conservator/restorer: [employer]  Tobacco Use  . Smoking status: Never Smoker  . Smokeless tobacco: Never Used  Vaping Use  . Vaping Use: Never used  Substance and Sexual Activity  . Alcohol use: Not Currently    Alcohol/week: 0.0 standard drinks    Comment: occassionally  . Drug use: No  . Sexual activity: Yes    Partners: Male    Birth control/protection: None, Surgical    Comment: last night  Other Topics Concern  . Not on file  Social History Narrative   Lives at home with her husband and children   Right handed   Caffeine 3 cups daily   Social Determinants of Health   Financial Resource Strain:   . Difficulty of Paying Living Expenses: Not on  file  Food Insecurity:   . Worried About Programme researcher, broadcasting/film/video in the Last Year: Not on file  . Ran Out of Food in the Last Year: Not on file  Transportation Needs:   . Lack of  Transportation (Medical): Not on file  . Lack of Transportation (Non-Medical): Not on file  Physical Activity:   . Days of Exercise per Week: Not on file  . Minutes of Exercise per Session: Not on file  Stress:   . Feeling of Stress : Not on file  Social Connections:   . Frequency of Communication with Friends and Family: Not on file  . Frequency of Social Gatherings with Friends and Family: Not on file  . Attends Religious Services: Not on file  . Active Member of Clubs or Organizations: Not on file  . Attends Banker Meetings: Not on file  . Marital Status: Not on file  Intimate Partner Violence:   . Fear of Current or Ex-Partner: Not on file  . Emotionally Abused: Not on file  . Physically Abused: Not on file  . Sexually Abused: Not on file      PHYSICAL EXAM  Vitals:   09/29/20 0751  BP: 113/69  Pulse: 73  Weight: 218 lb (98.9 kg)  Height: 5\' 4"  (1.626 m)   Body mass index is 37.42 kg/m.   Generalized: Well developed, in no acute distress   Neurological examination  Mentation: Alert oriented to time, place, history taking. Follows all commands speech and language fluent Cranial nerve II-XII: Pupils were equal round reactive to light. Extraocular movements were full, visual field were full on confrontational test. Facial sensation and strength were normal.  Motor: The motor testing reveals 5 over 5 strength of all 4 extremities. Good symmetric motor tone is noted throughout.  Gait and station: Gait is normal.      DIAGNOSTIC DATA (LABS, IMAGING, TESTING) - I reviewed patient records, labs, notes, testing and imaging myself where available.  Lab Results  Component Value Date   WBC 8.7 08/18/2018   HGB 13.4 08/18/2018   HCT 40.4 08/18/2018   MCV 92.9 08/18/2018   PLT 270  08/18/2018      Component Value Date/Time   NA 137 08/18/2018 2032   NA 139 05/24/2017 1027   K 4.1 08/18/2018 2032   CL 101 08/18/2018 2032   CO2 28 08/18/2018 2032   GLUCOSE 110 (H) 08/18/2018 2032   BUN 13 08/18/2018 2032   BUN 14 05/24/2017 1027   CREATININE 0.67 08/18/2018 2032   CALCIUM 8.9 08/18/2018 2032   PROT 7.0 08/18/2018 2032   PROT 7.1 05/24/2017 1027   ALBUMIN 3.8 08/18/2018 2032   ALBUMIN 4.2 05/24/2017 1027   AST 18 08/18/2018 2032   ALT 19 08/18/2018 2032   ALKPHOS 55 08/18/2018 2032   BILITOT 0.5 08/18/2018 2032   BILITOT 0.4 05/24/2017 1027   GFRNONAA >60 08/18/2018 2032   GFRAA >60 08/18/2018 2032   Lab Results  Component Value Date   CHOL 168 05/24/2017   HDL 59 05/24/2017   LDLCALC 94 05/24/2017   TRIG 76 05/24/2017   CHOLHDL 3 04/04/2012   Lab Results  Component Value Date   HGBA1C 5.1 05/24/2017   Lab Results  Component Value Date   VITAMINB12 389 05/24/2017   Lab Results  Component Value Date   TSH 1.790 05/24/2017      ASSESSMENT AND PLAN  24 y.o. year old female  has a past medical history of Anxiety, Complication of anesthesia, Depression, Endometriosis, Family history of adverse reaction to anesthesia, Gestational hypertension (01/2012), History of stomach ulcers, ovarian cyst, varicella, IBS (irritable bowel syndrome), Migraine, Panic attack, Pinched nerve, and Plica of knee (08/2012). here with   Migraine  with aura and without status migrainosus, not intractable  Hedaya reports that migraines have increased in frequency and intensity since being diagnosed with Covid in early September.  She is having to take over-the-counter medications on a regular basis.  I have advised that she try to avoid regular use of over-the-counter medications due to concerns of rebound headaches.  We will continue Aimovig injections monthly.  I will increase topiramate to 50 mg twice daily.  She will continue Nurtec for abortive therapy.  May use Reglan  and tizanidine as needed.  Adequate hydration, well-balanced diet and regular exercise encouraged.  She was encouraged to follow-up with her primary care provider post Covid infection for CPE.  She will follow-up with me in 6 months, sooner if needed.  She verbalizes understanding and agreement with this plan.  I spent 20 minutes of face-to-face and non-face-to-face time with patient.  This included previsit chart review, lab review, study review, order entry, electronic health record documentation, patient education.    Shawnie Dapper, MSN, FNP-C 09/29/2020, 8:11 AM  [facility] [address] [place], [place] [address] [phone]  Made any corrections needed, and agree with history, physical, neuro exam,assessment and plan as stated.     Naomie Dean, MD [facility]

## 2020-09-28 NOTE — Patient Instructions (Addendum)
Below is our plan:  We will continue Amovig monthly. I will increase topiramate to 50mg  twice daily. Continue Nurtec for abortive therapy.   Please make sure you are staying well hydrated. I recommend 50-60 ounces daily. Well balanced diet and regular exercise encouraged.    Please continue follow up with care team as directed.   Follow up with me in 6 months   You may receive a survey regarding today's visit. I encourage you to leave honest feed back as I do use this information to improve patient care. Thank you for seeing me today!      Migraine Headache A migraine headache is a very strong throbbing pain on one side or both sides of your head. This type of headache can also cause other symptoms. It can last from 4 hours to 3 days. Talk with your doctor about what things may bring on (trigger) this condition. What are the causes? The exact cause of this condition is not known. This condition may be triggered or caused by:  Drinking alcohol.  Smoking.  Taking medicines, such as: ? Medicine used to treat chest pain (nitroglycerin). ? Birth control pills. ? Estrogen. ? Some blood pressure medicines.  Eating or drinking certain products.  Doing physical activity. Other things that may trigger a migraine headache include:  Having a menstrual period.  Pregnancy.  Hunger.  Stress.  Not getting enough sleep or getting too much sleep.  Weather changes.  Tiredness (fatigue). What increases the risk?  Being 71-4 years old.  Being female.  Having a family history of migraine headaches.  Being Caucasian.  Having depression or anxiety.  Being very overweight. What are the signs or symptoms?  A throbbing pain. This pain may: ? Happen in any area of the head, such as on one side or both sides. ? Make it hard to do daily activities. ? Get worse with physical activity. ? Get worse around bright lights or loud noises.  Other symptoms may include: ? Feeling sick  to your stomach (nauseous). ? Vomiting. ? Dizziness. ? Being sensitive to bright lights, loud noises, or smells.  Before you get a migraine headache, you may get warning signs (an aura). An aura may include: ? Seeing flashing lights or having blind spots. ? Seeing bright spots, halos, or zigzag lines. ? Having tunnel vision or blurred vision. ? Having numbness or a tingling feeling. ? Having trouble talking. ? Having weak muscles.  Some people have symptoms after a migraine headache (postdromal phase), such as: ? Tiredness. ? Trouble thinking (concentrating). How is this treated?  Taking medicines that: ? Relieve pain. ? Relieve the feeling of being sick to your stomach. ? Prevent migraine headaches.  Treatment may also include: ? Having acupuncture. ? Avoiding foods that bring on migraine headaches. ? Learning ways to control your body functions (biofeedback). ? Therapy to help you know and deal with negative thoughts (cognitive behavioral therapy). Follow these instructions at home: Medicines  Take over-the-counter and prescription medicines only as told by your doctor.  Ask your doctor if the medicine prescribed to you: ? Requires you to avoid driving or using heavy machinery. ? Can cause trouble pooping (constipation). You may need to take these steps to prevent or treat trouble pooping:  Drink enough fluid to keep your pee (urine) pale yellow.  Take over-the-counter or prescription medicines.  Eat foods that are high in fiber. These include beans, whole grains, and fresh fruits and vegetables.  Limit foods that are high  in fat and sugar. These include fried or sweet foods. Lifestyle  Do not drink alcohol.  Do not use any products that contain nicotine or tobacco, such as cigarettes, e-cigarettes, and chewing tobacco. If you need help quitting, ask your doctor.  Get at least 8 hours of sleep every night.  Limit and deal with stress. General instructions       Keep a journal to find out what may bring on your migraine headaches. For example, write down: ? What you eat and drink. ? How much sleep you get. ? Any change in what you eat or drink. ? Any change in your medicines.  If you have a migraine headache: ? Avoid things that make your symptoms worse, such as bright lights. ? It may help to lie down in a dark, quiet room. ? Do not drive or use heavy machinery. ? Ask your doctor what activities are safe for you.  Keep all follow-up visits as told by your doctor. This is important. Contact a doctor if:  You get a migraine headache that is different or worse than others you have had.  You have more than 15 headache days in one month. Get help right away if:  Your migraine headache gets very bad.  Your migraine headache lasts longer than 72 hours.  You have a fever.  You have a stiff neck.  You have trouble seeing.  Your muscles feel weak or like you cannot control them.  You start to lose your balance a lot.  You start to have trouble walking.  You pass out (faint).  You have a seizure. Summary  A migraine headache is a very strong throbbing pain on one side or both sides of your head. These headaches can also cause other symptoms.  This condition may be treated with medicines and changes to your lifestyle.  Keep a journal to find out what may bring on your migraine headaches.  Contact a doctor if you get a migraine headache that is different or worse than others you have had.  Contact your doctor if you have more than 15 headache days in a month. This information is not intended to replace advice given to you by your health care provider. Make sure you discuss any questions you have with your health care provider. Document Revised: 03/15/2019 Document Reviewed: 01/03/2019 Elsevier Patient Education  2020 [employer].

## 2020-09-29 ENCOUNTER — Other Ambulatory Visit: Payer: Self-pay | Admitting: Family Medicine

## 2020-09-29 ENCOUNTER — Ambulatory Visit: Payer: BLUE CROSS/BLUE SHIELD | Admitting: Family Medicine

## 2020-09-29 ENCOUNTER — Encounter: Payer: Self-pay | Admitting: Family Medicine

## 2020-09-29 VITALS — BP 113/69 | HR 73 | Ht 64.0 in | Wt 218.0 lb

## 2020-09-29 DIAGNOSIS — G43109 Migraine with aura, not intractable, without status migrainosus: Secondary | ICD-10-CM | POA: Diagnosis not present

## 2020-09-29 MED ORDER — TOPIRAMATE 50 MG PO TABS: 50.0000 mg | ORAL_TABLET | Freq: Two times a day (BID) | ORAL | 3 refills | Status: DC

## 2020-09-29 MED ORDER — METOCLOPRAMIDE HCL 10 MG PO TABS: 10.0000 mg | ORAL_TABLET | Freq: Three times a day (TID) | ORAL | 5 refills | Status: DC | PRN

## 2020-09-29 MED ORDER — AIMOVIG 140 MG/ML ~~LOC~~ SOAJ: 140.0000 mg | SUBCUTANEOUS | 11 refills | Status: DC

## 2020-10-06 MED FILL — AIMOVIG 140 MG/ML SOAJ: 140 | 30 days supply | Qty: 1 | Fill #0

## 2020-10-20 ENCOUNTER — Ambulatory Visit: Payer: BLUE CROSS/BLUE SHIELD | Admitting: Family Medicine

## 2020-10-20 ENCOUNTER — Other Ambulatory Visit: Payer: Self-pay

## 2020-10-20 ENCOUNTER — Encounter: Payer: Self-pay | Admitting: Family Medicine

## 2020-10-20 VITALS — BP 132/80 | HR 88 | Ht 64.0 in | Wt 220.0 lb

## 2020-10-20 DIAGNOSIS — R5383 Other fatigue: Secondary | ICD-10-CM | POA: Diagnosis not present

## 2020-10-20 DIAGNOSIS — N6452 Nipple discharge: Secondary | ICD-10-CM

## 2020-10-20 DIAGNOSIS — N6320 Unspecified lump in the left breast, unspecified quadrant: Secondary | ICD-10-CM

## 2020-10-20 NOTE — Progress Notes (Signed)
° °  Subjective:    Patient ID: [name] Sanchez is a 24 y.o. female presenting with Breast Problem  on 10/20/2020  HPI: 2 months of breast lump and now is sore related to her bra.  Has not felt well for 2 months. Reports poor appetite, fatigue. Feels nauseous. No sick contacts. No fever, no chills. Also notes hair loss in last one month. Has had a hysterectomy. Has nipple pericings and noted some nipple discharge which is milky. No blood.  Review of Systems  Constitutional: Negative for chills and fever.  Respiratory: Negative for shortness of breath.   Cardiovascular: Negative for chest pain.  Gastrointestinal: Negative for abdominal pain, nausea and vomiting.  Genitourinary: Negative for dysuria.  Skin: Negative for rash.      Objective:    BP 132/80    Pulse 88    Ht 5\' 4"  (1.626 m)    Wt 220 lb (99.8 kg)    LMP 07/15/2018 (Exact Date)    BMI 37.76 kg/m  Physical Exam Constitutional:      General: She is not in acute distress.    Appearance: She is well-developed.  HENT:     Head: Normocephalic and atraumatic.  Eyes:     General: No scleral icterus. Cardiovascular:     Rate and Rhythm: Normal rate.  Pulmonary:     Effort: Pulmonary effort is normal.  Chest:       Comments: Bilateral nipple piercing, no discharge noted. Abdominal:     Palpations: Abdomen is soft.  Musculoskeletal:     Cervical back: Neck supple.  Skin:    General: Skin is warm and dry.  Neurological:     Mental Status: She is alert and oriented to person, place, and time.         Assessment & Plan:  Nipple discharge - given bloody discharge, will image both breasts - Plan: Prolactin, MM Digital Diagnostic Bilat, 09/14/2018 BREAST LTD UNI RIGHT INC AXILLA, US BREAST LTD UNI LEFT INC AXILLA  Other fatigue - Check labs to endure there is nothing else going on - Plan: Follicle stimulating hormone, TSH, CBC, Comprehensive metabolic panel, Hemoglobin A1c, VITAMIN D 25 Hydroxy (Vit-D Deficiency,  Fractures)  Breast mass, left - likely fibrocystic change--advice to limit caffeine, check imaging. - Plan: US BREAST LTD UNI LEFT INC AXILLA   Total time in review of prior notes, pathology, labs, history taking, review with patient, exam, note writing, discussion of options, plan for next steps, alternatives and risks of treatment: 21 minutes.  Return if symptoms worsen or fail to improve.  [place] 10/20/2020 4:46 PM

## 2020-10-20 NOTE — Progress Notes (Signed)
Lump on left side of left breast x 2 months, has been sore now for the last 2 weeks

## 2020-10-21 LAB — COMPREHENSIVE METABOLIC PANEL
ALT: 18 IU/L (ref 0–32)
AST: 17 IU/L (ref 0–40)
Albumin/Globulin Ratio: 1.7 (ref 1.2–2.2)
Albumin: 4.6 g/dL (ref 3.9–5.0)
Alkaline Phosphatase: 65 IU/L (ref 44–121)
BUN/Creatinine Ratio: 14 (ref 9–23)
BUN: 11 mg/dL (ref 6–20)
Bilirubin Total: 0.3 mg/dL (ref 0.0–1.2)
CO2: 22 mmol/L (ref 20–29)
Calcium: 9.8 mg/dL (ref 8.7–10.2)
Chloride: 105 mmol/L (ref 96–106)
Creatinine, Ser: 0.81 mg/dL (ref 0.57–1.00)
GFR calc Af Amer: 117 mL/min/{1.73_m2} (ref >59)
GFR calc non Af Amer: 101 mL/min/{1.73_m2} (ref >59)
Globulin, Total: 2.7 g/dL (ref 1.5–4.5)
Glucose: 91 mg/dL (ref 65–99)
Potassium: 4.3 mmol/L (ref 3.5–5.2)
Sodium: 140 mmol/L (ref 134–144)
Total Protein: 7.3 g/dL (ref 6.0–8.5)

## 2020-10-21 LAB — CBC
Hematocrit: 40.7 % (ref 34.0–46.6)
Hemoglobin: 14 g/dL (ref 11.1–15.9)
MCH: 32.3 pg (ref 26.6–33.0)
MCHC: 34.4 g/dL (ref 31.5–35.7)
MCV: 94 fL (ref 79–97)
Platelets: 268 10*3/uL (ref 150–450)
RBC: 4.34 x10E6/uL (ref 3.77–5.28)
RDW: 12.1 % (ref 11.7–15.4)
WBC: 6.1 10*3/uL (ref 3.4–10.8)

## 2020-10-21 LAB — FOLLICLE STIMULATING HORMONE: FSH: 4.4 m[IU]/mL

## 2020-10-21 LAB — TSH: TSH: 2.43 u[IU]/mL (ref 0.450–4.500)

## 2020-10-21 LAB — HEMOGLOBIN A1C
Est. average glucose Bld gHb Est-mCnc: 105 mg/dL
Hgb A1c MFr Bld: 5.3 % (ref 4.8–5.6)

## 2020-10-21 LAB — PROLACTIN: Prolactin: 12.3 ng/mL (ref 4.8–23.3)

## 2020-10-21 LAB — VITAMIN D 25 HYDROXY (VIT D DEFICIENCY, FRACTURES): Vit D, 25-Hydroxy: 25.8 ng/mL — ABNORMAL LOW (ref 30.0–100.0)

## 2020-11-03 ENCOUNTER — Ambulatory Visit
Admission: RE | Admit: 2020-11-03 | Discharge: 2020-11-03 | Disposition: A | Discharge status: Home / Self Care | Payer: BLUE CROSS/BLUE SHIELD | Source: Ambulatory Visit | Attending: Family Medicine | Admitting: Family Medicine

## 2020-11-03 ENCOUNTER — Other Ambulatory Visit: Payer: Self-pay

## 2020-11-03 ENCOUNTER — Other Ambulatory Visit: Payer: Self-pay | Admitting: Family Medicine

## 2020-11-03 DIAGNOSIS — N6452 Nipple discharge: Secondary | ICD-10-CM

## 2020-11-03 DIAGNOSIS — N6320 Unspecified lump in the left breast, unspecified quadrant: Secondary | ICD-10-CM

## 2020-11-05 MED FILL — AIMOVIG 140 MG/ML SOAJ: 140 | 30 days supply | Qty: 1 | Fill #1

## 2020-11-13 MED FILL — AIMOVIG 140 MG/ML SOAJ: 140 | 30 days supply | Qty: 1 | Fill #1

## 2020-12-07 ENCOUNTER — Ambulatory Visit: Payer: BLUE CROSS/BLUE SHIELD | Admitting: Podiatry

## 2020-12-08 NOTE — Telephone Encounter (Signed)
I completed another Nurtec PA that we received. Key: [id number]. Awaiting determination from [employer].

## 2020-12-10 NOTE — Telephone Encounter (Signed)
Received request from [employer] to fill out paper PA for new start as they state Nurtec was not previously approved through [employer]. Form completed and pending AL,NP's signature.

## 2020-12-12 ENCOUNTER — Encounter: Payer: Self-pay | Admitting: Emergency Medicine

## 2020-12-12 ENCOUNTER — Other Ambulatory Visit: Payer: Self-pay

## 2020-12-12 ENCOUNTER — Ambulatory Visit
Admission: EM | Admit: 2020-12-12 | Discharge: 2020-12-12 | Disposition: A | Discharge status: Home / Self Care | Payer: BLUE CROSS/BLUE SHIELD | Attending: Emergency Medicine | Admitting: Emergency Medicine

## 2020-12-12 DIAGNOSIS — Z20822 Contact with and (suspected) exposure to covid-19: Secondary | ICD-10-CM | POA: Insufficient documentation

## 2020-12-12 DIAGNOSIS — J069 Acute upper respiratory infection, unspecified: Secondary | ICD-10-CM | POA: Insufficient documentation

## 2020-12-12 LAB — POCT RAPID STREP A (OFFICE): Rapid Strep A Screen: NEGATIVE

## 2020-12-12 MED ORDER — ONDANSETRON 4 MG PO TBDP
4.0000 mg | ORAL_TABLET | Freq: Three times a day (TID) | ORAL | 0 refills | Status: DC | PRN
Start: 2020-12-12 — End: 2021-07-01

## 2020-12-12 NOTE — ED Provider Notes (Signed)
[facility]    CSN: [id number] Arrival date & time: 12/12/20  0859      History   Chief Complaint Chief Complaint  Patient presents with  . Sore Throat  . Generalized Body Aches    HPI [name] Sanchez is a 25 y.o. female presenting today for evaluation of URI symptoms.  Patient reports sore throat and body aches.  Also reports bilateral ear pain.  Reports prior Covid infection in September.  Main complaint has been nausea, tolerating saltines and ginger ale.  Has had subjective fevers.  Reports pressure on ears, history of ear infections and tubes.  HPI  Past Medical History:  Diagnosis Date  . Anxiety   . Complication of anesthesia    wakes up crying  . Depression   . Endometriosis   . Family history of adverse reaction to anesthesia    hard to wake up, low heart rate  . Gestational hypertension 01/2012   also had postpartum preelampsia  . History of stomach ulcers   . Hx of ovarian cyst    x 2-3  . Hx of varicella   . IBS (irritable bowel syndrome)   . Migraine    chronic  . Panic attack   . Pinched nerve    left arm  . Plica of knee 08/2012   medial plica left knee    Patient Active Problem List   Diagnosis Date Noted  . BMI 37.0-37.9, adult 02/11/2019  . Swelling of lymph node 10/02/2018  . Seasonal affective disorder (HCC) 09/19/2018  . Urinary frequency 09/19/2018  . Status post hysterectomy 08/14/2018  . Acute post-traumatic headache, not intractable 07/30/2018  . Pelvic pain 07/31/2017  . Chronic migraine without aura without status migrainosus, not intractable 05/06/2016  . Depression with anxiety 04/04/2012  . Healthcare maintenance 04/01/2012  . Migraine with aura and without status migrainosus, not intractable     Past Surgical History:  Procedure Laterality Date  . KNEE ARTHROSCOPY  08/17/2012   Procedure: ARTHROSCOPY KNEE;  Surgeon: Harvie Junior, MD;  Location: [facility];  Service: Orthopedics;  Laterality: Left;   left knee scope with tight lateral retinacular release and plica removal  . TYMPANOSTOMY TUBE PLACEMENT Bilateral 1998  . VAGINAL HYSTERECTOMY Bilateral 08/14/2018   Procedure: HYSTERECTOMY VAGINAL WITH SALPINGECTOMY;  Surgeon: Reva Bores, MD;  Location: [facility];  Service: Gynecology;  Laterality: Bilateral;    OB History    Gravida  3   Para  2   Term  2   Preterm      AB  1   Living  2     SAB      IAB  1   Ectopic      Multiple      Live Births  2            Home Medications    Prior to Admission medications   Medication Sig Start Date End Date Taking? Authorizing Provider  ondansetron (ZOFRAN ODT) 4 MG disintegrating tablet Take 1 tablet (4 mg total) by mouth every 8 (eight) hours as needed for nausea or vomiting. 12/12/20  Yes Ervan Heber C, PA-C  ALPRAZolam (XANAX) 0.5 MG tablet Take 1 tablet (0.5 mg total) by mouth daily as needed for anxiety. 11/12/18   Danford, Jinny Blossom, NP  Erenumab-aooe (AIMOVIG) 140 MG/ML SOAJ Inject 140 mg into the skin every 30 (thirty) days. 09/29/20   Lomax, Amy, NP  FLUoxetine (PROZAC) 40 MG capsule Take 1  capsule (40 mg total) by mouth daily. Patient taking differently: Take 20 mg by mouth daily.  02/11/19   Danford, Orpha Bur D, NP  metoCLOPramide (REGLAN) 10 MG tablet Take 1 tablet (10 mg total) by mouth 3 (three) times daily as needed. For nausea or migraine. 09/29/20   Lomax, Amy, NP  Rimegepant Sulfate (NURTEC) 75 MG TBDP Take 75 mg by mouth daily as needed (take for abortive therapy of migraine, no more than 1 tablet in 24 hours or 10 per month). 04/08/20   Lomax, Amy, NP  tiZANidine (ZANAFLEX) 4 MG tablet Take 1 tablet (4 mg total) by mouth every 6 (six) hours as needed for muscle spasms. Or headaches. Patient not taking: Reported on 12/12/2020 01/08/20   Shawnie Dapper, NP  topiramate (TOPAMAX) 50 MG tablet Take 1 tablet (50 mg total) by mouth 2 (two) times daily. 09/29/20   Lomax, Amy, NP  promethazine (PHENERGAN) 25 MG suppository Place  1 suppository (25 mg total) rectally every 6 (six) hours as needed for nausea or vomiting. 08/18/18 12/08/19  Rasch, Harolyn Rutherford, NP    Family History Family History  Problem Relation Age of Onset  . Hypertension Mother   . Heart disease Mother        MI  . Anesthesia problems Mother        post-op N/V  . Depression Mother   . Anxiety disorder Mother   . Obesity Mother   . Congestive Heart Failure Mother   . Asthma Sister   . Bipolar disorder Sister   . Asthma Brother   . Diabetes Maternal Aunt   . Heart disease Father   . Bipolar disorder Father   . Hyperlipidemia Father   . Uterine cancer Maternal Grandmother   . Pancreatic cancer Maternal Grandmother   . Lung cancer Maternal Grandfather     Social History Social History   Tobacco Use  . Smoking status: Never Smoker  . Smokeless tobacco: Never Used  Vaping Use  . Vaping Use: Never used  Substance Use Topics  . Alcohol use: Not Currently    Alcohol/week: 0.0 standard drinks    Comment: occassionally  . Drug use: No     Allergies   Percocet [oxycodone-acetaminophen], Cefuroxime axetil, and Penicillins   Review of Systems Review of Systems  Constitutional: Positive for fatigue and fever. Negative for activity change, appetite change and chills.  HENT: Positive for congestion, ear pain and sore throat. Negative for rhinorrhea, sinus pressure and trouble swallowing.   Eyes: Negative for discharge and redness.  Respiratory: Negative for cough, chest tightness and shortness of breath.   Cardiovascular: Negative for chest pain.  Gastrointestinal: Positive for nausea. Negative for abdominal pain, diarrhea and vomiting.  Musculoskeletal: Negative for myalgias.  Skin: Negative for rash.  Neurological: Negative for dizziness, light-headedness and headaches.     Physical Exam Triage Vital Signs ED Triage Vitals [12/12/20 0918]  Enc Vitals Group     BP 125/85     Pulse Rate 95     Resp 18     Temp 98.5 F (36.9 C)      Temp Source Oral     SpO2 96 %     Weight      Height      Head Circumference      Peak Flow      Pain Score      Pain Loc      Pain Edu?      Excl. in GC?  No data found.  Updated Vital Signs BP 125/85 (BP Location: Left Arm)   Pulse 95   Temp 98.5 F (36.9 C) (Oral)   Resp 18   LMP 07/15/2018 (Exact Date)   SpO2 96%   Visual Acuity Right Eye Distance:   Left Eye Distance:   Bilateral Distance:    Right Eye Near:   Left Eye Near:    Bilateral Near:     Physical Exam Vitals and nursing note reviewed.  Constitutional:      Appearance: She is well-developed and well-nourished.     Comments: No acute distress  HENT:     Head: Normocephalic and atraumatic.     Ears:     Comments: Bilateral ears without tenderness to palpation of external auricle, tragus and mastoid, EAC's without erythema or swelling, TM's with good bony landmarks and cone of light. Non erythematous. Scarring present on bilateral TMs    Nose: Nose normal.     Mouth/Throat:     Comments: Oral mucosa pink and moist, no tonsillar enlargement or exudate. Posterior pharynx patent and nonerythematous, no uvula deviation or swelling. Normal phonation. Eyes:     Conjunctiva/sclera: Conjunctivae normal.  Cardiovascular:     Rate and Rhythm: Normal rate and regular rhythm.  Pulmonary:     Effort: Pulmonary effort is normal. No respiratory distress.     Comments: Breathing comfortably at rest, CTABL, no wheezing, rales or other adventitious sounds auscultated Abdominal:     General: There is no distension.  Musculoskeletal:        General: Normal range of motion.     Cervical back: Neck supple.  Skin:    General: Skin is warm and dry.  Neurological:     Mental Status: She is alert and oriented to person, place, and time.  Psychiatric:        Mood and Affect: Mood and affect normal.      UC Treatments / Results  Labs (all labs ordered are listed, but only abnormal results are  displayed) Labs Reviewed  COVID-19, FLU A+B NAA  CULTURE, GROUP A STREP [facility])  POCT RAPID STREP A (OFFICE)    EKG   Radiology No results found.  Procedures Procedures (including critical care time)  Medications Ordered in UC Medications - No data to display  Initial Impression / Assessment and Plan / UC Course  I have reviewed the triage vital signs and the nursing notes.  Pertinent labs & imaging results that were available during my care of the patient were reviewed by me and considered in my medical decision making (see chart for details).     Viral URI with cough-Covid test/flu test pending, strep test negative.  Suspect viral etiology and recommending symptomatic and supportive care. Discussed strict return precautions. Patient verbalized understanding and is agreeable with plan.  Final Clinical Impressions(s) / UC Diagnoses   Final diagnoses:  Encounter for screening laboratory testing for COVID-19 virus  Viral URI with cough     Discharge Instructions     Covid/flu test pending Strep test negative Zofran as needed for nausea Drink plenty of fluids Start daily Flonase, may also use daily cetirizine/Zyrtec already/Claritin for postnasal drainage/throat irritation Honey and hot tea Follow-up if not improving or worsening    ED Prescriptions    Medication Sig Dispense Auth. Provider   ondansetron (ZOFRAN ODT) 4 MG disintegrating tablet Take 1 tablet (4 mg total) by mouth every 8 (eight) hours as needed for nausea or vomiting. 20 tablet Atalia Litzinger, [place]  C, PA-C     PDMP not reviewed this encounter.   Lew DawesWieters, [place] C, [place]PA-C 12/12/20 [phone]

## 2020-12-12 NOTE — Discharge Instructions (Signed)
Covid/flu test pending Strep test negative Zofran as needed for nausea Drink plenty of fluids Start daily Flonase, may also use daily cetirizine/Zyrtec already/Claritin for postnasal drainage/throat irritation Honey and hot tea Follow-up if not improving or worsening

## 2020-12-12 NOTE — ED Triage Notes (Signed)
Pt here for sore throat and body aches with fever x 3 days; pt sts some nausea; pt sts had covid in September

## 2020-12-15 LAB — COVID-19, FLU A+B NAA
Influenza A, NAA: NOT DETECTED
Influenza B, NAA: NOT DETECTED
SARS-CoV-2, NAA: NOT DETECTED

## 2020-12-15 LAB — CULTURE, GROUP A STREP (THRC)

## 2020-12-16 ENCOUNTER — Telehealth: Payer: Self-pay | Admitting: Family Medicine

## 2020-12-16 MED FILL — AIMOVIG 140 MG/ML SOAJ: 140 | 30 days supply | Qty: 1 | Fill #2

## 2020-12-16 NOTE — Telephone Encounter (Signed)
Nikki from [employer] called stating that the pt's Rimegepant Sulfate (NURTEC) 75 MG TBDP has been approved from 12/08/20-12/07/21  Auth # [id number] Lowella Bandy also stated that a letter has been sent.

## 2020-12-16 NOTE — Telephone Encounter (Signed)
Noted  

## 2020-12-16 NOTE — Telephone Encounter (Signed)
Per Cover My Meds, Nurtec approved effective from 12/08/2020 through 12/07/2021. Notified pharmacy via fax. Received a receipt of confirmation.

## 2021-01-15 MED FILL — AIMOVIG 140 MG/ML SOAJ: 140 | 30 days supply | Qty: 1 | Fill #3

## 2021-02-12 MED FILL — AIMOVIG 140 MG/ML SOAJ: 140 | 30 days supply | Qty: 1 | Fill #4

## 2021-02-15 ENCOUNTER — Telehealth: Payer: Self-pay | Admitting: Family Medicine

## 2021-02-15 NOTE — Telephone Encounter (Signed)
I agree with trying PAP. She may also call her insurance to inquire about cost of Ajovy or Emgality. TY.

## 2021-02-15 NOTE — Telephone Encounter (Signed)
Called pt and she is stating her copay now is $109/ mon.  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  This price is with her copay savings card and insurance. Do you want to try another injectable? Please advise.  She states that is does help.

## 2021-02-15 NOTE — Telephone Encounter (Signed)
Pt called, need a call back about Erenumab-aooe (AIMOVIG) 140 MG/ML SOAJ. Having issue with copay. Would like a call from the nurse.

## 2021-02-26 ENCOUNTER — Other Ambulatory Visit (HOSPITAL_BASED_OUTPATIENT_CLINIC_OR_DEPARTMENT_OTHER): Payer: Self-pay

## 2021-03-08 ENCOUNTER — Other Ambulatory Visit: Payer: Self-pay

## 2021-03-24 ENCOUNTER — Other Ambulatory Visit (HOSPITAL_COMMUNITY): Payer: Self-pay

## 2021-03-31 ENCOUNTER — Ambulatory Visit: Payer: BLUE CROSS/BLUE SHIELD | Admitting: Family Medicine

## 2021-03-31 ENCOUNTER — Encounter: Payer: Self-pay | Admitting: Family Medicine

## 2021-03-31 NOTE — Patient Instructions (Incomplete)
Below is our plan:  We will ***  Please make sure you are staying well hydrated. I recommend 50-60 ounces daily. Well balanced diet and regular exercise encouraged. Consistent sleep schedule with 6-8 hours recommended.   Please continue follow up with care team as directed.   Follow up with *** in ***  You may receive a survey regarding today's visit. I encourage you to leave honest feed back as I do use this information to improve patient care. Thank you for seeing me today!      Migraine Headache A migraine headache is a very strong throbbing pain on one side or both sides of your head. This type of headache can also cause other symptoms. It can last from 4 hours to 3 days. Talk with your doctor about what things may bring on (trigger) this condition. What are the causes? The exact cause of this condition is not known. This condition may be triggered or caused by:  Drinking alcohol.  Smoking.  Taking medicines, such as: ? Medicine used to treat chest pain (nitroglycerin). ? Birth control pills. ? Estrogen. ? Some blood pressure medicines.  Eating or drinking certain products.  Doing physical activity. Other things that may trigger a migraine headache include:  Having a menstrual period.  Pregnancy.  Hunger.  Stress.  Not getting enough sleep or getting too much sleep.  Weather changes.  Tiredness (fatigue). What increases the risk?  Being 25-55 years old.  Being female.  Having a family history of migraine headaches.  Being Caucasian.  Having depression or anxiety.  Being very overweight. What are the signs or symptoms?  A throbbing pain. This pain may: ? Happen in any area of the head, such as on one side or both sides. ? Make it hard to do daily activities. ? Get worse with physical activity. ? Get worse around bright lights or loud noises.  Other symptoms may include: ? Feeling sick to your stomach  (nauseous). ? Vomiting. ? Dizziness. ? Being sensitive to bright lights, loud noises, or smells.  Before you get a migraine headache, you may get warning signs (an aura). An aura may include: ? Seeing flashing lights or having blind spots. ? Seeing bright spots, halos, or zigzag lines. ? Having tunnel vision or blurred vision. ? Having numbness or a tingling feeling. ? Having trouble talking. ? Having weak muscles.  Some people have symptoms after a migraine headache (postdromal phase), such as: ? Tiredness. ? Trouble thinking (concentrating). How is this treated?  Taking medicines that: ? Relieve pain. ? Relieve the feeling of being sick to your stomach. ? Prevent migraine headaches.  Treatment may also include: ? Having acupuncture. ? Avoiding foods that bring on migraine headaches. ? Learning ways to control your body functions (biofeedback). ? Therapy to help you know and deal with negative thoughts (cognitive behavioral therapy). Follow these instructions at home: Medicines  Take over-the-counter and prescription medicines only as told by your doctor.  Ask your doctor if the medicine prescribed to you: ? Requires you to avoid driving or using heavy machinery. ? Can cause trouble pooping (constipation). You may need to take these steps to prevent or treat trouble pooping:  Drink enough fluid to keep your pee (urine) pale yellow.  Take over-the-counter or prescription medicines.  Eat foods that are high in fiber. These include beans, whole grains, and fresh fruits and vegetables.  Limit foods that are high in fat and sugar. These include fried or sweet foods. Lifestyle    Do not drink alcohol.  Do not use any products that contain nicotine or tobacco, such as cigarettes, e-cigarettes, and chewing tobacco. If you need help quitting, ask your doctor.  Get at least 8 hours of sleep every night.  Limit and deal with stress. General instructions  Keep a journal to  find out what may bring on your migraine headaches. For example, write down: ? What you eat and drink. ? How much sleep you get. ? Any change in what you eat or drink. ? Any change in your medicines.  If you have a migraine headache: ? Avoid things that make your symptoms worse, such as bright lights. ? It may help to lie down in a dark, quiet room. ? Do not drive or use heavy machinery. ? Ask your doctor what activities are safe for you.  Keep all follow-up visits as told by your doctor. This is important.      Contact a doctor if:  You get a migraine headache that is different or worse than others you have had.  You have more than 15 headache days in one month. Get help right away if:  Your migraine headache gets very bad.  Your migraine headache lasts longer than 72 hours.  You have a fever.  You have a stiff neck.  You have trouble seeing.  Your muscles feel weak or like you cannot control them.  You start to lose your balance a lot.  You start to have trouble walking.  You pass out (faint).  You have a seizure. Summary  A migraine headache is a very strong throbbing pain on one side or both sides of your head. These headaches can also cause other symptoms.  This condition may be treated with medicines and changes to your lifestyle.  Keep a journal to find out what may bring on your migraine headaches.  Contact a doctor if you get a migraine headache that is different or worse than others you have had.  Contact your doctor if you have more than 15 headache days in a month. This information is not intended to replace advice given to you by your health care provider. Make sure you discuss any questions you have with your health care provider. Document Revised: 03/15/2019 Document Reviewed: 01/03/2019 Elsevier Patient Education  2021 [employer]  

## 2021-03-31 NOTE — Progress Notes (Deleted)
No chief complaint on file.    HISTORY OF PRESENT ILLNESS: 03/31/21 ALL: [name] Sanchez returns today for migraine follow up. We increased topiramate to 50mg  BID and continued Amovig monthly, Nurtec, tizanidine and ondansetron PRN.    09/29/2020 ALL:  [name] Sanchez is a 25 y.o. female here today for follow up for migraines. We restarted Amovig in 01/2020. She called in 02/2020 with continued migraines. We started topiramate 25mg  BID. Rizatriptan was not effective and switched to Nurtec. She was doing well until last month when she was diagnosed with Covid. Since, she has had constant headaches. She has had to take more OTC medications to treat migraines. Nurtec does still help. Reglan helps with nausea. She takes 1/2 tablet of tizanidine from time to time that helps.    HISTORY (copied from my note on 01/08/2020)  [name] Sanchez is a 25 y.o. female here today for follow up for migraines. She was doing well on Amovig but lost her insurance in [name]. Last injection was in October. Since, headaches have returned. She was seen in the ER recently due to intractable migraine. She does have white flashing aura before migraine. She is not on birth control as she is s/p hysterectomy. Rizatriptan, tizanidine and Reglan help to abort headache. She rarely uses these medications when taking Amovig regularly.  She does have seasonal allergies and vertigo symptoms from time to time. Xyzal helps.   HISTORY: (copied from my note on 02/11/2019)  [name] Turneris a 25 y.o.femalehere today for follow up on migraines. She was doing well on Amovig and unfortunately had a lapse in insurance causing her to be without medication for about 3 months. She just recently got Amovig approved again. She has had more frequent and severe migraines. Unusally unilateral with radiation to the back of her head. She is very nauseous and sensitive to light and sounds. She was recently given metoclopramide that has helped some.She  complains of a pressure sensation behind the left eye that radiates to the back of her head. She has throbbing with nausea and vomiting. Light and sound make it worse. Sleep makes it better. She is currently being treated for a sinus infection. She has been on a total of three antibiotics. She does not feel that the antibiotics are helping. She is taking metoclopramide occasionally. Last dose was last night. She has not taken Maxalt in about 2 weeks. She took her last dose of Zofran last night.   She had an MRI ordered in September. She has not had this done yet.   Last eye exam July 2019.   HISTORY: (copied fromDr Ahern'snote on 08/08/2018) [name] 2019 N Turneris a 25 y.o.femalehere as requested by Dr. 10/08/2018 chronic migraines.Past medical history of migraine, depression, anxiety. Migraines since a child. No Fhx of migraies that she knows of. Migraines really started in HS. Seen by Neuro Dr. [facility] and here Dr. Sherre Poot in [place] and tried nerve blocks, topamax, trigger point injections, sphenopalatine ganglion block. Triptans made it worse. Also tried Amitriptyline. She had an amazing response to Aimovig will reorder. Daily headaches with superimposed migraines 15 migraine days a month, severe, worsening, last 1-2 days, pounding/pulsating/throbbing, +light sensitivity/ noise sensitivity, nausea has vomited movement akes it worse. She has black and white spots of aura.   Reviewed notes, labs and imaging from outside physicians, which showed:  Reviewed notes from [facility]. Patient was first seen in [name] of this year as a new patient. Patient reported migraine with aura estimated to  have 2-3 headaches per week she was followed by [facility] but is coming to [place] because of health insurance coverage she is not currently on any migraine Rx. She also has chronic anxiety and depression. 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 Exam was unremarkable.  Reviewed  emergency room notes from [name] 30, 2019 for progressively worsening left-sided headache the same as previous migraines with associated nausea vomiting photophobia and phonophobia previously followed by a neurologist in [place]. Not on any preventative or abortive therapies. She is been going to urgent care for IM Toradol and Phenergan when her headaches get bad. She also fell down the stairs 1 week prior and hit her head with intermittent blurred vision and an increase in her migraine since the falls.  Reviewed CT scan report 08/03/2018:IMPRESSION: Limited evaluation of the brain due to severe motion artifact and nondiagnostic study of the lower portion of the brain and base of the skull. No acute intracranial pathology identified in the upper portion of the brain. If there is high clinical concern for acute intracranial pathology, repeat CT is recommended.     REVIEW OF SYSTEMS: Out of a complete 14 system review of symptoms, the patient complains only of the following symptoms, headaches, nausea, and all other reviewed systems are negative.   ALLERGIES: Allergies  Allergen Reactions  . Percocet [Oxycodone-Acetaminophen] Hives and Itching  . Cefuroxime Axetil Rash  . Penicillins Rash     HOME MEDICATIONS: Outpatient Medications Prior to Visit  Medication Sig Dispense Refill  . ALPRAZolam (XANAX) 0.5 MG tablet Take 1 tablet (0.5 mg total) by mouth daily as needed for anxiety. 20 tablet 0  . Erenumab-aooe 140 MG/ML SOAJ INJECT 140 MG INTO THE SKIN EVERY 30 DAYS 1 mL 11  . FLUoxetine (PROZAC) 40 MG capsule Take 1 capsule (40 mg total) by mouth daily. (Patient taking differently: Take 20 mg by mouth daily. ) 90 capsule 3  . metoCLOPramide (REGLAN) 10 MG tablet Take 1 tablet (10 mg total) by mouth 3 (three) times daily as needed. For nausea or migraine. 10 tablet 5  . ondansetron (ZOFRAN ODT) 4 MG disintegrating tablet Take 1 tablet (4 mg total) by mouth every 8 (eight) hours as  needed for nausea or vomiting. 20 tablet 0  . Rimegepant Sulfate (NURTEC) 75 MG TBDP Take 75 mg by mouth daily as needed (take for abortive therapy of migraine, no more than 1 tablet in 24 hours or 10 per month). 10 tablet 11  . tiZANidine (ZANAFLEX) 4 MG tablet Take 1 tablet (4 mg total) by mouth every 6 (six) hours as needed for muscle spasms. Or headaches. (Patient not taking: Reported on 12/12/2020) 30 tablet 0  . topiramate (TOPAMAX) 50 MG tablet Take 1 tablet (50 mg total) by mouth 2 (two) times daily. 180 tablet 3   No facility-administered medications prior to visit.     PAST MEDICAL HISTORY: Past Medical History:  Diagnosis Date  . Anxiety   . Complication of anesthesia    wakes up crying  . Depression   . Endometriosis   . Family history of adverse reaction to anesthesia    hard to wake up, low heart rate  . Gestational hypertension 01/2012   also had postpartum preelampsia  . History of stomach ulcers   . Hx of ovarian cyst    x 2-3  . Hx of varicella   . IBS (irritable bowel syndrome)   . Migraine    chronic  . Panic  attack   . Pinched nerve    left arm  . Plica of knee 08/2012   medial plica left knee     PAST SURGICAL HISTORY: Past Surgical History:  Procedure Laterality Date  . KNEE ARTHROSCOPY  08/17/2012   Procedure: ARTHROSCOPY KNEE;  Surgeon: Harvie Junior, MD;  Location: [facility];  Service: Orthopedics;  Laterality: Left;  left knee scope with tight lateral retinacular release and plica removal  . TYMPANOSTOMY TUBE PLACEMENT Bilateral 1998  . VAGINAL HYSTERECTOMY Bilateral 08/14/2018   Procedure: HYSTERECTOMY VAGINAL WITH SALPINGECTOMY;  Surgeon: Reva Bores, MD;  Location: [facility];  Service: Gynecology;  Laterality: Bilateral;     FAMILY HISTORY: Family History  Problem Relation Age of Onset  . Hypertension Mother   . Heart disease Mother        MI  . Anesthesia problems Mother        post-op N/V  . Depression Mother   .  Anxiety disorder Mother   . Obesity Mother   . Congestive Heart Failure Mother   . Asthma Sister   . Bipolar disorder Sister   . Asthma Brother   . Diabetes Maternal Aunt   . Heart disease Father   . Bipolar disorder Father   . Hyperlipidemia Father   . Uterine cancer Maternal Grandmother   . Pancreatic cancer Maternal Grandmother   . Lung cancer Maternal Grandfather      SOCIAL HISTORY: Social History   Socioeconomic History  . Marital status: Married    Spouse name: Will  . Number of children: 2  . Years of education: Not on file  . Highest education level: Some college, no degree  Occupational History  . Occupation: Furniture conservator/restorer: [employer]  Tobacco Use  . Smoking status: Never Smoker  . Smokeless tobacco: Never Used  Vaping Use  . Vaping Use: Never used  Substance and Sexual Activity  . Alcohol use: Not Currently    Alcohol/week: 0.0 standard drinks    Comment: occassionally  . Drug use: No  . Sexual activity: Yes    Partners: Male    Birth control/protection: Surgical  Other Topics Concern  . Not on file  Social History Narrative   Lives at home with her husband and children   Right handed   Caffeine 3 cups daily   Social Determinants of Health   Financial Resource Strain: Not on file  Food Insecurity: Not on file  Transportation Needs: Not on file  Physical Activity: Not on file  Stress: Not on file  Social Connections: Not on file  Intimate Partner Violence: Not on file      PHYSICAL EXAM  There were no vitals filed for this visit. There is no height or weight on file to calculate BMI.   Generalized: Well developed, in no acute distress   Neurological examination  Mentation: Alert oriented to time, place, history taking. Follows all commands speech and language fluent Cranial nerve II-XII: Pupils were equal round reactive to light. Extraocular movements were full, visual field were full on confrontational test. Facial sensation  and strength were normal.  Motor: The motor testing reveals 5 over 5 strength of all 4 extremities. Good symmetric motor tone is noted throughout.  Gait and station: Gait is normal.      DIAGNOSTIC DATA (LABS, IMAGING, TESTING) - I reviewed patient records, labs, notes, testing and imaging myself where available.  Lab Results  Component Value Date   WBC  6.1 [name] Sanchez   HGB 14.0 [name] Sanchez   HCT 40.7 [name] Sanchez   MCV 94 [name] Sanchez   PLT 268 [name] Sanchez      Component Value Date/Time   NA 140 [name] Sanchez 1027   K 4.3 [name] Sanchez 1027   CL 105 [name] Sanchez 1027   CO2 22 [name] Sanchez 1027   GLUCOSE 91 [name] Sanchez 1027   GLUCOSE 110 (H) 08/18/2018 2032   BUN 11 [name] Sanchez 1027   CREATININE 0.81 [name] Sanchez 1027   CALCIUM 9.8 [name] Sanchez 1027   PROT 7.3 [name] Sanchez 1027   ALBUMIN 4.6 [name] Sanchez 1027   AST 17 [name] Sanchez 1027   ALT 18 [name] Sanchez 1027   ALKPHOS 65 [name] Sanchez 1027   BILITOT 0.3 [name] Sanchez 1027   GFRNONAA 101 [name] Sanchez 1027   GFRAA 117 [name] Sanchez 1027   Lab Results  Component Value Date   CHOL 168 05/24/2017   HDL 59 05/24/2017   LDLCALC 94 05/24/2017   TRIG 76 05/24/2017   CHOLHDL 3 04/04/2012   Lab Results  Component Value Date   HGBA1C 5.3 [name] Sanchez   Lab Results  Component Value Date   VITAMINB12 389 05/24/2017   Lab Results  Component Value Date   TSH 2.430 [name] Sanchez      ASSESSMENT AND PLAN  25 y.o. year old female  has a past medical history of Anxiety, Complication of anesthesia, Depression, Endometriosis, Family history of adverse reaction to anesthesia, Gestational hypertension (01/2012), History of stomach ulcers, ovarian cyst, varicella, IBS (irritable bowel syndrome), Migraine, Panic attack, Pinched nerve, and Plica of knee (08/2012). here with   No diagnosis found.  [name] Sanchez reports that migraines have increased in frequency and intensity since being diagnosed with Covid in early September.  She is having to take over-the-counter medications  on a regular basis.  I have advised that she try to avoid regular use of over-the-counter medications due to concerns of rebound headaches.  We will continue Aimovig injections monthly.  I will increase topiramate to 50 mg twice daily.  She will continue Nurtec for abortive therapy.  May use Reglan and tizanidine as needed.  Adequate hydration, well-balanced diet and regular exercise encouraged.  She was encouraged to follow-up with her primary care provider post Covid infection for CPE.  She will follow-up with me in 6 months, sooner if needed.  She verbalizes understanding and agreement with this plan.  I spent 20 minutes of face-to-face and non-face-to-face time with patient.  This included previsit chart review, lab review, study review, order entry, electronic health record documentation, patient education.    Shawnie DapperAmy Macoy Rodwell, MSN, FNP-C 03/31/2021, 7:32 AM  [facility] [address], [address] [place], [place] [address] [phone]

## 2021-04-06 ENCOUNTER — Other Ambulatory Visit (HOSPITAL_COMMUNITY): Payer: Self-pay

## 2021-04-06 MED FILL — Erenumab-aooe Subcutaneous Soln Auto-Injector 140 MG/ML: SUBCUTANEOUS | 30 days supply | Qty: 1 | Fill #0 | Status: AC

## 2021-04-07 ENCOUNTER — Emergency Department (HOSPITAL_COMMUNITY)
Admission: EM | Admit: 2021-04-07 | Discharge: 2021-04-07 | Discharge status: Against Medical Advice | Payer: BLUE CROSS/BLUE SHIELD | Attending: Emergency Medicine | Admitting: Emergency Medicine

## 2021-04-07 ENCOUNTER — Other Ambulatory Visit: Payer: Self-pay

## 2021-04-07 ENCOUNTER — Emergency Department (HOSPITAL_COMMUNITY): Payer: BLUE CROSS/BLUE SHIELD

## 2021-04-07 DIAGNOSIS — R112 Nausea with vomiting, unspecified: Secondary | ICD-10-CM | POA: Insufficient documentation

## 2021-04-07 DIAGNOSIS — R519 Headache, unspecified: Secondary | ICD-10-CM | POA: Diagnosis present

## 2021-04-07 DIAGNOSIS — H53149 Visual discomfort, unspecified: Secondary | ICD-10-CM | POA: Diagnosis not present

## 2021-04-07 DIAGNOSIS — Z5321 Procedure and treatment not carried out due to patient leaving prior to being seen by health care provider: Secondary | ICD-10-CM | POA: Diagnosis not present

## 2021-04-07 LAB — I-STAT BETA HCG BLOOD, ED (MC, WL, AP ONLY): I-stat hCG, quantitative: <5 m[IU]/mL (ref <5)

## 2021-04-07 NOTE — ED Provider Notes (Signed)
Emergency Medicine Provider Triage Evaluation Note  [name] Sanchez , a 25 y.o. female  was evaluated in triage.  Pt complains of headache. She has history of migraines, but states this is the worse headache of her life. It started one week ago and hasn't stopped. She says toradol has helped, but not completed alleviated it. States she feels a heavy pressure in the back of her head. It is worse with movement of her eyes. She vomited twice today. No blurry vision, no recent trauma, no fever.   Review of Systems  Positive: Headache, nausea, vomiting, photophobia Negative: Vision changes, diarrhea, muscle weakness, fevers, chills, dizziness   Physical Exam  LMP 07/15/2018 (Exact Date)  Gen:   Awake, tearful.  Resp:  Normal effort MSK:   Moves extremities without difficulty. Grip strength equal bilaterally. LE strength equal bilaterally.   Eyes  EOMI in tact.    Medical Decision Making  Medically screening exam initiated at 5:53 PM.  Appropriate orders placed.  TREASE BREMNER was informed that the remainder of the evaluation will be completed by another provider, this initial triage assessment does not replace that evaluation, and the importance of remaining in the ED until their evaluation is complete.  Dx: worse headache of life   Theron Arista, Cordelia Poche 04/07/21 1757    Cathren Laine, MD 04/07/21 2050

## 2021-04-07 NOTE — ED Triage Notes (Signed)
Pt with hx of migraines here for eval of migraine x 1 week. Has been seen twice for same and given toradol with only temporary relief. Endorses n/v and is taking zofran for same.

## 2021-04-07 NOTE — ED Notes (Signed)
Pt stated she is going home, she cannot stay in here any longer with this migraine she has. Tried to encourage pt to stay and told her I would be happy to put her in a recliner. Pt states "it's ok, Im just gonna go home"

## 2021-04-08 ENCOUNTER — Telehealth: Payer: Self-pay | Admitting: Family Medicine

## 2021-04-08 NOTE — Telephone Encounter (Signed)
Called patient.  She has had a migraine for about a week starting when she was at the beach last weekend she has been to urgent care where received Toradol Reglan Prednisone at beach. When came home still with migraine went to her primary care for a swollen lymph node but also gave her a Toradol injection at that point as well.  Still experiencing heaviness pressure throbbing when she is up moving around level is 7-8 she does have some nausea,  lights and sounds are bothersome to her.  She has not had infusion here previously.  She is allergic to Ceftin and penicillin.  She is not pregnant.  I touched base with intra fusion and Liane, they are available  for migraine infusion for patient at 930 or 1300.  I relayed to patient we will touch base with Amy NP.   Will get back with her in soon as I speak with her.

## 2021-04-08 NOTE — Telephone Encounter (Signed)
I agree with infusion. If not improving following infusion, please have her schedule follow up to discuss. TY.

## 2021-04-08 NOTE — Telephone Encounter (Signed)
Relayed to pt and she will come in for infusion at 1300.  Orders to Liane RN in intrafusion.  She will have driver. Depacon 1000mg  IV, toradol 30mg  IV compazine 10mg  IV. Not pregnant, has driver. Pt is taking topamax, aimovig and nurtec.

## 2021-04-08 NOTE — Telephone Encounter (Signed)
Pt called and stated that she has had a headache for a week and has tried everything even gone to urgent care and nothing has helped. She would like to know if she can have an infusion appt. Please advise.

## 2021-04-13 ENCOUNTER — Other Ambulatory Visit: Payer: Self-pay

## 2021-04-13 MED ORDER — NURTEC 75 MG PO TBDP: 75.0000 mg | ORAL_TABLET | Freq: Every day | ORAL | 11 refills | Status: DC | PRN

## 2021-04-16 ENCOUNTER — Other Ambulatory Visit: Payer: Self-pay

## 2021-04-16 ENCOUNTER — Emergency Department (HOSPITAL_COMMUNITY)
Admission: EM | Admit: 2021-04-16 | Discharge: 2021-04-16 | Disposition: A | Discharge status: Home / Self Care | Payer: BLUE CROSS/BLUE SHIELD | Attending: Emergency Medicine | Admitting: Emergency Medicine

## 2021-04-16 ENCOUNTER — Emergency Department (HOSPITAL_COMMUNITY): Payer: BLUE CROSS/BLUE SHIELD

## 2021-04-16 ENCOUNTER — Encounter (HOSPITAL_COMMUNITY): Payer: Self-pay | Admitting: *Deleted

## 2021-04-16 DIAGNOSIS — H538 Other visual disturbances: Secondary | ICD-10-CM | POA: Diagnosis not present

## 2021-04-16 DIAGNOSIS — G8929 Other chronic pain: Secondary | ICD-10-CM | POA: Diagnosis not present

## 2021-04-16 DIAGNOSIS — R519 Headache, unspecified: Secondary | ICD-10-CM | POA: Diagnosis present

## 2021-04-16 LAB — BASIC METABOLIC PANEL
Anion gap: 6 (ref 5–15)
BUN: 14 mg/dL (ref 6–20)
CO2: 26 mmol/L (ref 22–32)
Calcium: 9.4 mg/dL (ref 8.9–10.3)
Chloride: 106 mmol/L (ref 98–111)
Creatinine, Ser: 0.72 mg/dL (ref 0.44–1.00)
GFR, Estimated: >60 mL/min (ref >60)
Glucose, Bld: 101 mg/dL — ABNORMAL HIGH (ref 70–99)
Potassium: 3.9 mmol/L (ref 3.5–5.1)
Sodium: 138 mmol/L (ref 135–145)

## 2021-04-16 LAB — CBC WITH DIFFERENTIAL/PLATELET
Abs Immature Granulocytes: 0.08 10*3/uL — ABNORMAL HIGH (ref 0.00–0.07)
Basophils Absolute: 0 10*3/uL (ref 0.0–0.1)
Basophils Relative: 0 %
Eosinophils Absolute: 0 10*3/uL (ref 0.0–0.5)
Eosinophils Relative: 0 %
HCT: 40.9 % (ref 36.0–46.0)
Hemoglobin: 13.6 g/dL (ref 12.0–15.0)
Immature Granulocytes: 1 %
Lymphocytes Relative: 22 %
Lymphs Abs: 2.7 10*3/uL (ref 0.7–4.0)
MCH: 31.6 pg (ref 26.0–34.0)
MCHC: 33.3 g/dL (ref 30.0–36.0)
MCV: 95.1 fL (ref 80.0–100.0)
Monocytes Absolute: 0.7 10*3/uL (ref 0.1–1.0)
Monocytes Relative: 5 %
Neutro Abs: 9 10*3/uL — ABNORMAL HIGH (ref 1.7–7.7)
Neutrophils Relative %: 72 %
Platelets: 239 10*3/uL (ref 150–400)
RBC: 4.3 MIL/uL (ref 3.87–5.11)
RDW: 12.2 % (ref 11.5–15.5)
WBC: 12.5 10*3/uL — ABNORMAL HIGH (ref 4.0–10.5)
nRBC: 0 % (ref 0.0–0.2)

## 2021-04-16 LAB — URINALYSIS, ROUTINE W REFLEX MICROSCOPIC
Bilirubin Urine: NEGATIVE
Glucose, UA: NEGATIVE mg/dL
Hgb urine dipstick: NEGATIVE
Ketones, ur: NEGATIVE mg/dL
Leukocytes,Ua: NEGATIVE
Nitrite: NEGATIVE
Protein, ur: NEGATIVE mg/dL
Specific Gravity, Urine: 1.021 (ref 1.005–1.030)
pH: 6 (ref 5.0–8.0)

## 2021-04-16 LAB — PREGNANCY, URINE: Preg Test, Ur: NEGATIVE

## 2021-04-16 LAB — POC URINE PREG, ED: Preg Test, Ur: NEGATIVE

## 2021-04-16 MED ORDER — PROCHLORPERAZINE EDISYLATE 10 MG/2ML IJ SOLN
10.0000 mg | Freq: Once | INTRAMUSCULAR | Status: DC
  Filled 2021-04-16: qty 2

## 2021-04-16 MED ORDER — DIPHENHYDRAMINE HCL 50 MG/ML IJ SOLN
25.0000 mg | Freq: Once | INTRAMUSCULAR | Status: AC
  Administered 2021-04-16: 25 mg via INTRAVENOUS
  Filled 2021-04-16: qty 1

## 2021-04-16 MED ORDER — METOCLOPRAMIDE HCL 5 MG/ML IJ SOLN
10.0000 mg | Freq: Once | INTRAMUSCULAR | Status: AC
  Administered 2021-04-16: 10 mg via INTRAVENOUS
  Filled 2021-04-16: qty 2

## 2021-04-16 MED ORDER — KETOROLAC TROMETHAMINE 30 MG/ML IJ SOLN
30.0000 mg | Freq: Once | INTRAMUSCULAR | Status: AC
  Administered 2021-04-16: 30 mg via INTRAVENOUS
  Filled 2021-04-16: qty 1

## 2021-04-16 NOTE — ED Triage Notes (Signed)
Patient presents to ed with c/o heahache that started 4/29 was started on prednisone and antibiotic. States she went to her pcp today for headache and weakness in her arm and decreased vision of which hs had her eyes checked and now her PCP wants her to have and MRI

## 2021-04-16 NOTE — ED Notes (Signed)
Pat to MRI 

## 2021-04-16 NOTE — ED Provider Notes (Signed)
[facility] EMERGENCY DEPARTMENT Provider Note   CSN: [id number] Arrival date & time: 04/16/21  1607     History Chief Complaint  Patient presents with  . Headache    [name] Sanchez is a 25 y.o. female.  Patient presents with recurrent migraine headache.  Describes as left-sided throbbing headache worse with light worse with loud noises.  She feels like it is different from her prior headaches.  She states that this time is associated with changes in her vision.  She states that when she looks left or right she has blurry vision the last for a few seconds and then gradually improves.  She saw her neurologist who sent her to optometrist who said there was concern for possible stroke causing this.  She subsequently sent to the ER by her primary care doctor to work-up stroke and get an MRI of the brain.        Past Medical History:  Diagnosis Date  . Anxiety   . Complication of anesthesia    wakes up crying  . Depression   . Endometriosis   . Family history of adverse reaction to anesthesia    hard to wake up, low heart rate  . Gestational hypertension 01/2012   also had postpartum preelampsia  . History of stomach ulcers   . Hx of ovarian cyst    x 2-3  . Hx of varicella   . IBS (irritable bowel syndrome)   . Migraine    chronic  . Panic attack   . Pinched nerve    left arm  . Plica of knee 08/2012   medial plica left knee    Patient Active Problem List   Diagnosis Date Noted  . BMI 37.0-37.9, adult 02/11/2019  . Swelling of lymph node 10/02/2018  . Seasonal affective disorder (HCC) 09/19/2018  . Urinary frequency 09/19/2018  . Status post hysterectomy 08/14/2018  . Acute post-traumatic headache, not intractable 07/30/2018  . Pelvic pain 07/31/2017  . Chronic migraine without aura without status migrainosus, not intractable 05/06/2016  . Depression with anxiety 04/04/2012  . Healthcare maintenance 04/01/2012  . Migraine with aura and without  status migrainosus, not intractable     Past Surgical History:  Procedure Laterality Date  . KNEE ARTHROSCOPY  08/17/2012   Procedure: ARTHROSCOPY KNEE;  Surgeon: Harvie Junior, MD;  Location: [facility];  Service: Orthopedics;  Laterality: Left;  left knee scope with tight lateral retinacular release and plica removal  . TYMPANOSTOMY TUBE PLACEMENT Bilateral 1998  . VAGINAL HYSTERECTOMY Bilateral 08/14/2018   Procedure: HYSTERECTOMY VAGINAL WITH SALPINGECTOMY;  Surgeon: Reva Bores, MD;  Location: [facility];  Service: Gynecology;  Laterality: Bilateral;     OB History    Gravida  3   Para  2   Term  2   Preterm      AB  1   Living  2     SAB      IAB  1   Ectopic      Multiple      Live Births  2           Family History  Problem Relation Age of Onset  . Hypertension Mother   . Heart disease Mother        MI  . Anesthesia problems Mother        post-op N/V  . Depression Mother   . Anxiety disorder Mother   . Obesity Mother   .  Congestive Heart Failure Mother   . Asthma Sister   . Bipolar disorder Sister   . Asthma Brother   . Diabetes Maternal Aunt   . Heart disease Father   . Bipolar disorder Father   . Hyperlipidemia Father   . Uterine cancer Maternal Grandmother   . Pancreatic cancer Maternal Grandmother   . Lung cancer Maternal Grandfather     Social History   Tobacco Use  . Smoking status: Never Smoker  . Smokeless tobacco: Never Used  Vaping Use  . Vaping Use: Never used  Substance Use Topics  . Alcohol use: Not Currently    Alcohol/week: 0.0 standard drinks    Comment: occassionally  . Drug use: No    Home Medications Prior to Admission medications   Medication Sig Start Date End Date Taking? Authorizing Provider  ALPRAZolam Prudy Feeler) 0.5 MG tablet Take 1 tablet (0.5 mg total) by mouth daily as needed for anxiety. 11/12/18   Danford, Orpha Bur D, NP  Erenumab-aooe 140 MG/ML SOAJ INJECT 140 MG INTO THE SKIN EVERY 30 DAYS  09/29/20 09/29/21  Lomax, Amy, NP  FLUoxetine (PROZAC) 40 MG capsule Take 1 capsule (40 mg total) by mouth daily. Patient taking differently: Take 20 mg by mouth daily.  02/11/19   Danford, Orpha Bur D, NP  metoCLOPramide (REGLAN) 10 MG tablet Take 1 tablet (10 mg total) by mouth 3 (three) times daily as needed. For nausea or migraine. 09/29/20   Lomax, Amy, NP  ondansetron (ZOFRAN ODT) 4 MG disintegrating tablet Take 1 tablet (4 mg total) by mouth every 8 (eight) hours as needed for nausea or vomiting. 12/12/20   Wieters, Hallie C, PA-C  Rimegepant Sulfate (NURTEC) 75 MG TBDP Take 75 mg by mouth daily as needed (take for abortive therapy of migraine, no more than 1 tablet in 24 hours or 10 per month). 04/13/21   Lomax, Amy, NP  tiZANidine (ZANAFLEX) 4 MG tablet Take 1 tablet (4 mg total) by mouth every 6 (six) hours as needed for muscle spasms. Or headaches. Patient not taking: Reported on 12/12/2020 01/08/20   Shawnie Dapper, NP  topiramate (TOPAMAX) 50 MG tablet Take 1 tablet (50 mg total) by mouth 2 (two) times daily. 09/29/20   Lomax, Amy, NP  promethazine (PHENERGAN) 25 MG suppository Place 1 suppository (25 mg total) rectally every 6 (six) hours as needed for nausea or vomiting. 08/18/18 12/08/19  Rasch, Harolyn Rutherford, NP    Allergies    Percocet [oxycodone-acetaminophen], Cefuroxime axetil, and Penicillins  Review of Systems   Review of Systems  Constitutional: Negative for fever.  HENT: Negative for ear pain.   Eyes: Negative for pain.  Respiratory: Negative for cough.   Cardiovascular: Negative for chest pain.  Gastrointestinal: Negative for abdominal pain.  Genitourinary: Negative for flank pain.  Musculoskeletal: Negative for back pain.  Skin: Negative for rash.  Neurological: Positive for headaches.    Physical Exam Updated Vital Signs BP 113/79 (BP Location: Right Arm)   Pulse 70   Temp 99 F (37.2 C) (Oral)   Resp 17   Ht 5\' 4"  (1.626 m)   Wt 92.5 kg   LMP 07/15/2018 (Exact Date)   SpO2  100%   BMI 35.02 kg/m   Physical Exam Constitutional:      General: She is not in acute distress.    Appearance: Normal appearance.  HENT:     Head: Normocephalic.     Nose: Nose normal.  Eyes:     Extraocular Movements: Extraocular movements intact.  Cardiovascular:     Rate and Rhythm: Normal rate.  Pulmonary:     Effort: Pulmonary effort is normal.  Musculoskeletal:        General: Normal range of motion.     Cervical back: Normal range of motion.  Neurological:     General: No focal deficit present.     Mental Status: She is alert and oriented to person, place, and time. Mental status is at baseline.     Cranial Nerves: No cranial nerve deficit.     Sensory: No sensory deficit.     Motor: No weakness.     Coordination: Coordination normal.     Gait: Gait normal.     Deep Tendon Reflexes: Reflexes normal.     Comments: Extraocular motions intact pupils reactive bilaterally.  Gait is normal strength 5/5 all extremities.     ED Results / Procedures / Treatments   Labs (all labs ordered are listed, but only abnormal results are displayed) Labs Reviewed  CBC WITH DIFFERENTIAL/PLATELET - Abnormal; Notable for the following components:      Result Value   WBC 12.5 (*)    Neutro Abs 9.0 (*)    Abs Immature Granulocytes 0.08 (*)    All other components within normal limits  BASIC METABOLIC PANEL - Abnormal; Notable for the following components:   Glucose, Bld 101 (*)    All other components within normal limits  URINALYSIS, ROUTINE W REFLEX MICROSCOPIC - Abnormal; Notable for the following components:   APPearance HAZY (*)    Bacteria, UA RARE (*)    All other components within normal limits  PREGNANCY, URINE  POC URINE PREG, ED    EKG EKG Interpretation  Date/Time:  Friday Apr 16 2021 10:09:32 EDT Ventricular Rate:  88 PR Interval:  140 QRS Duration: 76 QT Interval:  332 QTC Calculation: 401 R Axis:   92 Text Interpretation: Normal sinus rhythm Rightward  axis Nonspecific ST abnormality Abnormal ECG Confirmed by Norman Clay ([id number]) on 04/16/2021 6:58:44 PM   Radiology MR BRAIN WO CONTRAST  Result Date: 04/16/2021 CLINICAL DATA:  Initial evaluation for acute headache. EXAM: MRI HEAD WITHOUT CONTRAST TECHNIQUE: Multiplanar, multiecho pulse sequences of the brain and surrounding structures were obtained without intravenous contrast. COMPARISON:  Prior CT from 04/07/2021 FINDINGS: Brain: Cerebral volume within normal limits. Few punctate foci of FLAIR hyperintensity noted involving the subcortical white matter of the right frontal lobe (series 6, image 16), nonspecific, but minimal in nature and of doubtful significance. No abnormal foci of restricted diffusion to suggest acute or subacute ischemia. Gray-white matter differentiation maintained. No encephalomalacia to suggest chronic cortical infarction. No foci of susceptibility artifact to suggest acute or chronic intracranial hemorrhage. Incidental note made of 2 adjacent 7 mm benign appearing T2 hyperintense simple cyst within the pineal gland. No other mass lesion, mass effect, or midline shift. No hydrocephalus or extra-axial fluid collection. Pituitary gland suprasellar region normal. Midline structures intact. Vascular: Major intracranial vascular flow voids are maintained. Skull and upper cervical spine: Craniocervical junction within normal limits. Bone marrow signal intensity normal. No scalp soft tissue abnormality. Sinuses/Orbits: Globes and orbital soft tissues demonstrate no acute finding. Paranasal sinuses are largely clear. No significant mastoid effusion. Inner ear structures grossly normal. Other: None. IMPRESSION: Essentially normal brain MRI. No acute intracranial abnormality identified. Electronically Signed   By: Rise Mu M.D.   On: 04/16/2021 19:42    Procedures Procedures   Medications Ordered in ED Medications  ketorolac (TORADOL) 30 MG/ML injection  30 mg (30 mg  Intravenous Given 04/16/21 1732)  diphenhydrAMINE (BENADRYL) injection 25 mg (25 mg Intravenous Given 04/16/21 1732)  metoCLOPramide (REGLAN) injection 10 mg (10 mg Intravenous Given 04/16/21 1821)    ED Course  I have reviewed the triage vital signs and the nursing notes.  Pertinent labs & imaging results that were available during my care of the patient were reviewed by me and considered in my medical decision making (see chart for details).    MDM Rules/Calculators/A&P                          Work-up unremarkable for acute findings.  Symptoms resolved with medications provided in the ER  Given findings recommending continued outpatient follow-up with neurology, advising immediate return for worsening symptoms pain or any additional concerns.Marland Kitchen.  MRI of the brain performed, no evidence of acute stroke.   Final Clinical Impression(s) / ED Diagnoses Final diagnoses:  Chronic nonintractable headache, unspecified headache type    Rx / DC Orders ED Discharge Orders    None       Cheryll CockayneHong, Keontay Vora S, MD 04/16/21 2021

## 2021-04-16 NOTE — Discharge Instructions (Addendum)
Call your primary care doctor or specialist as discussed in the next 2-3 days.   Return immediately back to the ER if:  Your symptoms worsen within the next 12-24 hours. You develop new symptoms such as new fevers, persistent vomiting, new pain, shortness of breath, or new weakness or numbness, or if you have any other concerns.  

## 2021-04-16 NOTE — ED Notes (Signed)
Patient walked to bathroom at this time for UA

## 2021-04-19 ENCOUNTER — Telehealth: Payer: Self-pay | Admitting: Family Medicine

## 2021-04-19 ENCOUNTER — Encounter: Payer: Self-pay | Admitting: Family Medicine

## 2021-04-19 NOTE — Telephone Encounter (Signed)
Amy NP responded to pt via mychart.

## 2021-04-19 NOTE — Telephone Encounter (Signed)
Awaiting update per ALNP.  Pt to see opthamology?

## 2021-04-19 NOTE — Telephone Encounter (Signed)
Pt called needing to schedule a sooner appt due to an ER visit on Friday the 13th. Pt states that she was informed to follow up with Neuro and to move up her appt if possible. Please advise.

## 2021-05-11 ENCOUNTER — Other Ambulatory Visit (HOSPITAL_COMMUNITY): Payer: Self-pay

## 2021-05-11 MED FILL — Erenumab-aooe Subcutaneous Soln Auto-Injector 140 MG/ML: SUBCUTANEOUS | 30 days supply | Qty: 1 | Fill #1 | Status: CN

## 2021-05-13 ENCOUNTER — Telehealth: Payer: Self-pay | Admitting: Family Medicine

## 2021-05-13 NOTE — Telephone Encounter (Signed)
Pt moved from 6/14 due to amy being out.

## 2021-05-18 ENCOUNTER — Telehealth: Payer: Self-pay

## 2021-05-18 ENCOUNTER — Ambulatory Visit: Payer: BLUE CROSS/BLUE SHIELD | Admitting: Family Medicine

## 2021-05-18 NOTE — Telephone Encounter (Signed)
PA submitted via CMM, Danique Capek (Key: [id number]) Aimovig 140MG /ML auto-injectors Waiting on determination from [facility] [employer].

## 2021-05-19 ENCOUNTER — Ambulatory Visit: Payer: BLUE CROSS/BLUE SHIELD | Admitting: Neurology

## 2021-05-19 ENCOUNTER — Encounter: Payer: Self-pay | Admitting: Neurology

## 2021-05-19 VITALS — BP 116/67 | HR 74 | Ht 64.0 in | Wt 213.0 lb

## 2021-05-19 DIAGNOSIS — H547 Unspecified visual loss: Secondary | ICD-10-CM | POA: Diagnosis not present

## 2021-05-19 DIAGNOSIS — R519 Headache, unspecified: Secondary | ICD-10-CM

## 2021-05-19 DIAGNOSIS — G43009 Migraine without aura, not intractable, without status migrainosus: Secondary | ICD-10-CM | POA: Diagnosis not present

## 2021-05-19 DIAGNOSIS — G43101 Migraine with aura, not intractable, with status migrainosus: Secondary | ICD-10-CM

## 2021-05-19 DIAGNOSIS — W57XXXA Bitten or stung by nonvenomous insect and other nonvenomous arthropods, initial encounter: Secondary | ICD-10-CM

## 2021-05-19 MED ORDER — QULIPTA 60 MG PO TABS: 60.0000 mg | ORAL_TABLET | Freq: Every day | ORAL | 11 refills | Status: DC

## 2021-05-19 MED ORDER — QULIPTA 60 MG PO TABS: 60.0000 mg | ORAL_TABLET | Freq: Every day | ORAL | 0 refills | Status: DC

## 2021-05-19 NOTE — Progress Notes (Signed)
[facility]    Provider:  Dr Lucia Gaskins Referring Provider: Iona Hansen, NP Primary Care Physician:  Iona Hansen, NP  CC:  Chronic migraines    Interval history 05/19/2021: Initially seen in 2019 and has followed up in our office 3 times since with NP Amy Lomax(see Below).  She has done well on Aimovig over the years and more recently is also on Nurtec due to failure of multiple acute management medications including triptans and others.   In April, she was out of town, she felt severe pressure in her head, never had that before, continued through Sunday she went to the ED and received medications and was able to drive home. The doctor felt a lymph node on 5/3 her pcp and was placed on doxycycline for 7 days, toradol shot, she went to the ED and waited for 7 hours, MRI of the brain was essentially normal, on 5/14 she had an infusion in our office and went to the ED a week later. She tried reglan, nurtec, tizanidine, cannot take triptans as she has side effects. She was doing great on Aimovig but now feels effectiveness wearing off. 4-5 migraine days a month. She also saw ophthalmology. More presssure around the head. Ear ache, episodes of blurry vision.   MRI brain 04/16/2021: IMPRESSION: Essentially normal brain MRI. No acute intracranial abnormality identified. Reviewed images and agree.  From review of chart:Medications tried that can be used in migraine management include: Aimovig, Tylenol, tizanidine, topiramate, baclofen, Fioricet, dexamethasone, Benadryl, Lexapro, Prozac, Nurtec, ibuprofen, BC powders, ketorolac injections and tablets, magnesium, Mobic, Depo-Medrol injections, Reglan oral and injections, Zofran ODT tablets and injections, Paxil, prednisone tablets, Compazine injections, Phenergan suppositories and tablets, propranolol, Maxalt, sumatriptan, Zoloft, amitriptyline, maxalt, triptans contraindicated  HISTORY (copied from Amy Lomax note on  01/08/2020)  [name] Sanchez is a 25 y.o. female here today for follow up for migraines. We restarted Amovig in 01/2020. She called in 02/2020 with continued migraines. We started topiramate 25mg  BID. Rizatriptan was not effective and switched to Nurtec. She was doing well until last month when she was diagnosed with Covid. Since, she has had constant headaches. She has had to take more OTC medications to treat migraines. Nurtec does still help. Reglan helps with nausea. She takes 1/2 tablet of tizanidine from time to time that helps.    HISTORY (copied from Amy Lomax note on 01/08/2020)  [name] Sanchez is a 25 y.o. female here today for follow up for migraines. She was doing well on Amovig but lost her insurance in November. Last injection was in October. Since, headaches have returned. She was seen in the ER recently due to intractable migraine. She does have white flashing aura before migraine. She is not on birth control as she is s/p hysterectomy. Rizatriptan, tizanidine and Reglan help to abort headache. She rarely uses these medications when taking Amovig regularly.  She does have seasonal allergies and vertigo symptoms from time to time. Xyzal helps.    HISTORY: (copied from my Amy Lomax  on 02/11/2019)   [name] Sanchez is a 25 y.o. female here today for follow up on migraines. She was doing well on Amovig and unfortunately had a lapse in insurance causing her to be without medication for about 3 months. She just recently got Amovig approved again. She has had more frequent and severe migraines. Unusally unilateral with radiation to the back of her head. She is very nauseous and sensitive to light and sounds. She was  recently given metoclopramide that has helped some.  She complains of a pressure sensation behind the left eye that radiates to the back of her head. She has throbbing with nausea and vomiting. Light and sound make it worse. Sleep makes it better. She is currently being treated for a sinus  infection. She has been on a total of three antibiotics. She does not feel that the antibiotics are helping. She is taking metoclopramide occasionally. Last dose was last night. She has not taken Maxalt in about 2 weeks. She took her last dose of Zofran last night.   HPI: 08/08/2018  [name] Sanchez is a 25 y.o. female here as requested by Dr. Yetta Barre for chronic migraines.  Past medical history of migraine, depression, anxiety. Migraines since a child. No Fhx of migraies that she knows of. Migraines really started in HS. Seen by Neuro Dr. Sherryll Burger and here Dr. Neale Burly in [place] and tried nerve blocks, topamax, trigger point injections, sphenopalatine ganglion block. Triptans made it worse. Also tried Amitriptyline. She had an amazing response to Aimovig will reorder.  Daily headaches with superimposed migraines 15 migraine days a month, severe, worsening, last 1-2 days, pounding/pulsating/throbbing, +light sensitivity/ noise sensitivity, nausea has vomited movement akes it worse. She has black and white spots of aura.    Reviewed notes, labs and imaging from outside physicians, which showed:  Reviewed notes from [facility]  Patient was first seen in August of this year as a new patient.  Patient reported migraine with aura estimated to have 2-3 headaches per week she was followed by [facility] but is coming to [place] because of health insurance coverage she is not currently on any migraine Rx.  She also has chronic anxiety and depression.  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  Exam was unremarkable.  Reviewed emergency room notes from August 03, 2018 for progressively worsening left-sided headache the same as previous migraines with associated nausea vomiting photophobia and phonophobia previously followed by a neurologist in [place].  Not on any preventative or abortive therapies.  She is been going to urgent care for IM Toradol and Phenergan when her headaches get bad.  She also fell down  the stairs 1 week prior and hit her head with intermittent blurred vision and an increase in her migraine since the falls.  Reviewed CT scan report 08/03/2018: IMPRESSION: Limited evaluation of the brain due to severe motion artifact and nondiagnostic study of the lower portion of the brain and base of the skull. No acute intracranial pathology identified in the upper portion of the brain. If there is high clinical concern for acute intracranial pathology, repeat CT is recommended.  Review of Systems: Patient complains of symptoms per HPI as well as the following symptoms: pressure headache. Pertinent negatives and positives per HPI. All others negative    Social History   Socioeconomic History   Marital status: Married    Spouse name: Will   Number of children: 2   Years of education: Not on file   Highest education level: Some college, no degree  Occupational History   Occupation: Furniture conservator/restorer: [employer]  Tobacco Use   Smoking status: Never   Smokeless tobacco: Never  Vaping Use   Vaping Use: Never used  Substance and Sexual Activity   Alcohol use: Not Currently    Alcohol/week: 0.0 standard drinks    Comment: occassionally   Drug use: No   Sexual activity: Yes  Partners: Male    Birth control/protection: Surgical  Other Topics Concern   Not on file  Social History Narrative   Lives at home with her husband and children   Right handed   Caffeine 1 cup daily   Social Determinants of Health   Financial Resource Strain: Not on file  Food Insecurity: Not on file  Transportation Needs: Not on file  Physical Activity: Not on file  Stress: Not on file  Social Connections: Not on file  Intimate Partner Violence: Not on file    Family History  Problem Relation Age of Onset   Hypertension Mother    Heart disease Mother        MI   Anesthesia problems Mother        post-op N/V   Depression Mother    Anxiety disorder Mother    Obesity Mother     Congestive Heart Failure Mother    Asthma Sister    Bipolar disorder Sister    Asthma Brother    Diabetes Maternal Aunt    Heart disease Father    Bipolar disorder Father    Hyperlipidemia Father    Uterine cancer Maternal Grandmother    Pancreatic cancer Maternal Grandmother    Lung cancer Maternal Grandfather     Past Medical History:  Diagnosis Date   Anxiety    Complication of anesthesia    wakes up crying   Depression    Endometriosis    Family history of adverse reaction to anesthesia    hard to wake up, low heart rate   Gestational hypertension 01/2012   also had postpartum preelampsia   History of stomach ulcers    Hx of ovarian cyst    x 2-3   Hx of varicella    IBS (irritable bowel syndrome)    Migraine    chronic   Panic attack    Pinched nerve    left arm   Plica of knee 08/2012   medial plica left knee    Past Surgical History:  Procedure Laterality Date   KNEE ARTHROSCOPY  08/17/2012   Procedure: ARTHROSCOPY KNEE;  Surgeon: Harvie Junior, MD;  Location: [facility];  Service: Orthopedics;  Laterality: Left;  left knee scope with tight lateral retinacular release and plica removal   TYMPANOSTOMY TUBE PLACEMENT Bilateral 1998   VAGINAL HYSTERECTOMY Bilateral 08/14/2018   Procedure: HYSTERECTOMY VAGINAL WITH SALPINGECTOMY;  Surgeon: Reva Bores, MD;  Location: [facility];  Service: Gynecology;  Laterality: Bilateral;    Current Outpatient Medications  Medication Sig Dispense Refill   ALPRAZolam (XANAX) 0.5 MG tablet Take 1 tablet (0.5 mg total) by mouth daily as needed for anxiety. 20 tablet 0   Atogepant (QULIPTA) 60 MG TABS Take 60 mg by mouth daily. 30 tablet 11   Atogepant (QULIPTA) 60 MG TABS Take 60 mg by mouth daily. 12 tablet 0   FLUoxetine (PROZAC) 40 MG capsule Take 1 capsule (40 mg total) by mouth daily. (Patient taking differently: Take 20 mg by mouth daily.) 90 capsule 3   metoCLOPramide (REGLAN) 10 MG tablet Take 1 tablet (10 mg  total) by mouth 3 (three) times daily as needed. For nausea or migraine. 10 tablet 5   ondansetron (ZOFRAN ODT) 4 MG disintegrating tablet Take 1 tablet (4 mg total) by mouth every 8 (eight) hours as needed for nausea or vomiting. 20 tablet 0   tiZANidine (ZANAFLEX) 4 MG tablet Take 1 tablet (4 mg total) by mouth every  6 (six) hours as needed for muscle spasms. Or headaches. 30 tablet 0   No current facility-administered medications for this visit.    Allergies as of 05/19/2021 - Review Complete 05/19/2021  Allergen Reaction Noted   Percocet [oxycodone-acetaminophen] Hives and Itching 08/01/2018   Cefuroxime axetil Rash 09/04/2011   Penicillins Rash 07/17/2018    Vitals: BP 116/67 (BP Location: Right Arm, Patient Position: Sitting, Cuff Size: Large)   Pulse 74   Ht 5\' 4"  (1.626 m)   Wt 213 lb (96.6 kg)   LMP 07/15/2018 (Exact Date)   SpO2 100%   BMI 36.56 kg/m  Last Weight:  Wt Readings from Last 1 Encounters:  05/19/21 213 lb (96.6 kg)   Last Height:   Ht Readings from Last 1 Encounters:  05/19/21 5\' 4"  (1.626 m)  Exam: NAD, pleasant                  Speech:    Speech is normal; fluent and spontaneous with normal comprehension.  Cognition:    The patient is oriented to person, place, and time;     recent and remote memory intact;     language fluent;    Cranial Nerves:    The pupils are equal, round, and reactive to light.fundi margins sharp and flat; no papilledema. Trigeminal sensation is intact and the muscles of mastication are normal. The face is symmetric. The palate elevates in the midline. Hearing intact. Voice is normal. Shoulder shrug is normal. The tongue has normal motion without fasciculations.   Coordination:  No dysmetria  Motor Observation:    No asymmetry, no atrophy, and no involuntary movements noted. Tone:    Normal muscle tone.     Strength:    Strength is V/V in the upper and lower limbs.      Sensation: intact to LT       Assessment/Plan:  25 year old with episodic migraines with and without aura. Failed multipe acute and preventatives. Aimovig not working anymore.  New headache, pressure around the head, episodic vision changes, 25 years old and obese: LP to check opening pressure for IDIOPATHIC INTRACRANIAL HYPERTENSION. Stop topamax as may confound results of LP.   Episodic Migraine Preventative: Change to [employer] daily. If this does not work, try 30.   Migraine acute: consider trying ubrelvy based on response to qulipta  Discussed again: There is increased risk for stroke in women with migraine with aura and a  Contraindication for the combined contraceptive pill for use by women who have migraine with aura, which is in line with World Health Organisation recommendations. The risk for women with migraine without aura is lower and other risk factors like smoking are far more likely to increase stroke risk than migraine. There is a recommendation for no smoking and for the use of low estrogen or progestogen only pills particularly for women with migraine with aura. It is important however that women with migraine who are taking the pill do not decide to suddenly stop taking it without discussing this with their doctor. Please discuss with her OB/GYN.  Discussed: To prevent or relieve headaches, try the following: Cool Compress. Lie down and place a cool compress on your head.  Avoid headache triggers. If certain foods or odors seem to have triggered your migraines in the past, avoid them. A headache diary might help you identify triggers.  Include physical activity in your daily routine. Try a daily walk or other moderate aerobic exercise.  Manage stress.  Find healthy ways to cope with the stressors, such as delegating tasks on your to-do list.  Practice relaxation techniques. Try deep breathing, yoga, massage and visualization.  Eat regularly. Eating regularly scheduled meals and maintaining a  healthy diet might help prevent headaches. Also, drink plenty of fluids.  Follow a regular sleep schedule. Sleep deprivation might contribute to headaches Consider biofeedback. With this mind-body technique, you learn to control certain bodily functions -- such as muscle tension, heart rate and blood pressure -- to prevent headaches or reduce headache pain.    Proceed to emergency room if you experience new or worsening symptoms or symptoms do not resolve, if you have new neurologic symptoms or if headache is severe, or for any concerning symptom.   Provided education and documentation from American headache Society toolbox including articles on: chronic migraine medication overuse headache, chronic migraines, prevention of migraines, behavioral and other nonpharmacologic treatments for headache.  Cc: Jenne PaneKaty Danford  Angelyna Henderson, MD  [facility] [address] [place], [place] [id number]  Phone [phone] Fax [phone]  I spent over 40 minutes of face-to-face and non-face-to-face time with patient on the  1. Migraine without aura and without status migrainosus, not intractable   2. Pressure in head   3. Vision problems   4. Migraine with aura and with status migrainosus, not intractable    diagnosis.  This included previsit chart review, lab review, study review, order entry, electronic health record documentation, patient education on the different diagnostic and therapeutic options, counseling and coordination of care, risks and benefits of management, compliance, or risk factor reduction

## 2021-05-19 NOTE — Addendum Note (Signed)
Addended by: Naomie Dean B on: 05/19/2021 01:51 PM   Modules accepted: Orders

## 2021-05-19 NOTE — Patient Instructions (Signed)
Stop topiramate  Lumbar Puncture Start Qulipta daily  [employer]visor 2018, 1st Edition (1st ed., pp. [id number]). [place], [place]: Elsevier."> Rosalie Gums and [facility] (7th ed., pp. [phone]). [place], [place]: [employer]."> https://www.ncbi.nlm.nih.gov/books/NBK507811/">  Idiopathic Intracranial Hypertension  Idiopathic intracranial hypertension (IIH) is a condition that increases pressure around the brain. The fluid that surrounds the brain and spinal cord (cerebrospinal fluid, or CSF) increases and causes the pressure. Idiopathic means that the cause ofthis condition is not known. IIH affects the brain and spinal cord (neurological disorder). If this condition is not treated, it can cause vision loss or blindness. What are the causes? The cause of this condition is not known. What increases the risk? The following factors may make you more likely to develop this condition: Being very overweight (obese). Being a female between the ages of 41 and 18 years old, who has not gone through menopause. Taking certain medicines, such as birth control or steroids. What are the signs or symptoms? Symptoms of this condition include: Headaches. This is the most common symptom. Brief episodes of total blindness. Double vision, blurred vision, or poor side (peripheral) vision. Pain in the shoulders or neck. Nausea and vomiting. A sound like rushing water or a pulsing sound within the ears (pulsatile tinnitus), or ringing in the ears. How is this diagnosed? This condition may be diagnosed based on: Your symptoms and medical history. Imaging tests of the brain, such as: CT scan. MRI. Magnetic resonance venogram (MRV) to check the veins. Diagnostic lumbar puncture. This is a procedure to remove and examine a sample of cerebrospinal fluid. This procedure can determine whether too much fluid may be causing IIH. A thorough eye exam to check for swelling or nerve damage  in the eyes. How is this treated? Treatment for this condition depends on the symptoms. The goal of treatment is to decrease the pressure around your brain. Common treatments include: Weight loss through healthy eating, salt restriction, and exercise, if you are overweight. Medicines to decrease the production of spinal fluid and lower the pressure within your skull. Medicines to prevent or treat headaches. Other treatments may include: Surgery to place drains (shunts) in your brain for removing excess fluid. Lumbar puncture to remove excess cerebrospinal fluid. Follow these instructions at home: If you are overweight or obese, work with your health care provider to lose weight. Take over-the-counter and prescription medicines only as told by your health care provider. Ask your health care provider if the medicine prescribed to you requires you to avoid driving or using machinery. Do not use any products that contain nicotine or tobacco, such as cigarettes, e-cigarettes, and chewing tobacco. If you need help quitting, ask your health care provider. Keep all follow-up visits as told by your health care provider. This is important. Contact a health care provider if: You have changes in your vision, such as: Double vision. Blurred vision. Poor peripheral vision. Get help right away if: You have any of the following symptoms and they get worse or do not get better: Headaches. Nausea. Vomiting. Sudden trouble seeing. Summary Idiopathic intracranial hypertension (IIH) is a condition that increases pressure around the brain. The cause is not known (is idiopathic). The most common symptom of IIH is headaches. Vision changes, pain in the shoulders or neck, nausea, and vomiting may also occur. Treatment for this condition depends on your symptoms. The goal of treatment is to decrease the pressure around your brain. If you are overweight or obese, work with your health care provider  to lose  weight. Take over-the-counter and prescription medicines only as told by your health care provider. This information is not intended to replace advice given to you by your health care provider. Make sure you discuss any questions you have with your healthcare provider. Document Revised: 11/02/2019 Document Reviewed: 11/02/2019 Elsevier Patient Education  2022 Elsevier Inc. Lumbar Puncture A lumbar puncture, also called a spinal tap, is a procedure that is done to remove a small amount of the fluid that surrounds the brain and spinal cord (cerebrospinal fluid, CSF). The fluid is then examined in the lab. This procedure may be done to: Help diagnose various problems, such as meningitis, encephalitis, multiple sclerosis, and other infections. Remove fluid and relieve pressure that occurs with certain types of headaches. Look for bleeding within the brain and spinal cord areas (central nervous system). Place medicine into the spinal fluid. Tell a health care provider about: Any allergies you have. All medicines you are taking, including vitamins, herbs, eye drops, creams, and over-the-counter medicines like aspirin or NSAIDs. Any problems you or family members have had with anesthetic medicines. Any blood disorders you have. Any surgeries you have had. Any medical conditions you have. Whether you are pregnant or may be pregnant. What are the risks? Generally, this is a safe procedure. However, problems may occur, including: Infection at the insertion site that can spread to the bone or spinal fluid. Bleeding. Leakage of CSF. Spinal headache. This is a severe headache that occurs when there is a leak of spinal fluid. Allergic reactions to medicines or dyes. Damage to other structures or organs. What happens before the procedure? Staying hydrated Follow instructions from your health care provider about hydration, which may include: Up to 2 hours before the procedure - you may continue to  drink clear liquids, such as water, clear fruit juice, black coffee, and plain tea. Eating and drinking restrictions Follow instructions from your health care provider about eating and drinking. If you will be given a medicine to help you relax (sedative), these instructions may include: 8 hours before the procedure - stop eating heavy meals or foods such as meat, fried foods, or fatty foods. 6 hours before the procedure - stop eating light meals or foods, such as toast or cereal. 6 hours before the procedure - stop drinking milk or drinks that contain milk. 2 hours before the procedure - stop drinking clear liquids. Medicines Ask your health care provider about: Changing or stopping your regular medicines. This is especially important if you are taking diabetes medicines or blood thinners. Taking medicines such as aspirin and ibuprofen. These medicines can thin your blood. Do not take these medicines before your procedure if your health care provider instructs you not to. You may be given antibiotic medicine to help prevent infection. If so, take the antibiotic as told by your health care provider. General instructions You may have a blood sample taken. You may be asked to shower with a germ-killing soap. Ask your health care provider how your surgical site will be marked or identified. Plan to have someone take you home from the hospital or clinic. This is especially important if you will be given a sedative. If you will be going home right after the procedure, plan to have someone with you for 24 hours. What happens during the procedure?  You may lie down on your side with your knees bent, or you may sit with your head resting on a pillow on your lap. How you are positioned  depends on your age and size. You will be positioned so that the spaces between the bones of the spine (vertebrae) are as wide as possible. This will make it easier to pass the needle into the spinal canal. The skin on  your lower back (lumbar region) will be cleaned. You will be given an injection of medicine to numb your lower back area (local anesthetic). You may be given pain medicine or a sedative. A small needle will be inserted into your lower back until it enters the space that contains the cerebrospinal fluid. The needle will not enter the spinal cord. Fluid will be collected into tubes. It will be sent to a lab for examination. The needle will be withdrawn, and a bandage (dressing) will be placed over the area. The procedure may vary among health care providers and hospitals. What happens after the procedure? Your blood pressure, heart rate, breathing rate, and blood oxygen level will be monitored until any medicines you were given have worn off. You may need to stay lying down for a while. You will need to drink plenty of fluids and caffeine to help prevent a headache. Do not drive for 24 hours if you received a sedative. Summary A lumbar puncture, also called a spinal tap, is a procedure that is done to remove a small amount of the cerebrospinal fluid, CSF. This may be done to help diagnose a wide variety of conditions. Before the procedure, tell your health care provider about all medicines you are taking, including vitamins, herbs, eye drops, creams, and over-the-counter medicines. Before the procedure, ask your health care provider about changing or stopping your regular medicines. This is especially important if you are taking blood thinners. Your lower back will be numbed with an injection before the needle is placed into your spinal canal. After the procedure, you will lie down for a while and you will drink plenty of fluids. This information is not intended to replace advice given to you by your health care provider. Make sure you discuss any questions you have with your healthcare provider. Document Revised: 10/01/2020 Document Reviewed: 10/01/2020 Elsevier Patient Education  2022 Elsevier  Inc.   Atogepant tablets What is this medication? ATOGEPANT (a TOE je pant) is used to prevent migraine headaches. This medicine may be used for other purposes; ask your health care provider orpharmacist if you have questions. COMMON BRAND NAME(S): QULIPTA What should I tell my care team before I take this medication? They need to know if you have any of these conditions: kidney disease liver disease an unusual or allergic reaction to atogepant, other medicines, foods, dyes, or preservatives pregnant or trying to get pregnant breast-feeding How should I use this medication? Take this medicine by mouth with water. Take it as directed on the prescription label at the same time every day. You can take it with or without food. If it upsets your stomach, take it with food. Keep taking it unless your health careprovider tells you to stop. Talk to your health care provider about the use of this medicine in children.Special care may be needed. Overdosage: If you think you have taken too much of this medicine contact apoison control center or emergency room at once. NOTE: This medicine is only for you. Do not share this medicine with others. What if I miss a dose? If you miss a dose, take it as soon as you can. If it is almost time for yournext dose, take only that dose. Do not take double  or extra doses. What may interact with this medication? carbamazepine certain medicines for fungal infections like itraconazole, ketoconazole clarithromycin cyclosporine efavirenz etravirine phenytoin rifampin [facility]. John's Wort This list may not describe all possible interactions. Give your health care provider a list of all the medicines, herbs, non-prescription drugs, or dietary supplements you use. Also tell them if you smoke, drink alcohol, or use illegaldrugs. Some items may interact with your medicine. What should I watch for while using this medication? Visit your health care provider for regular  checks on your progress. Tell your health care provider if your symptoms do not start to get better or if they getworse. What side effects may I notice from receiving this medication? Side effects that you should report to your doctor or health care provider assoon as possible: allergic reactions (skin rash, itching or hives; swelling of the face, lips, tongue) light-colored stool liver injury (dark yellow or brown urine; general ill feeling or flu-like symptoms; loss of appetite, right upper belly pain; unusually weak or tired, yellowing of the eyes or skin) Side effects that usually do not require medical attention (report these toyour doctor or health care provider if they continue or are bothersome): constipation lack or loss of appetite nausea unusually weak or tired weight loss This list may not describe all possible side effects. Call your doctor for medical advice about side effects. You may report side effects to FDA at1-800-FDA-1088. Where should I keep my medication? Keep out of the reach of children and pets. Store at room temperature between 20 and 25 degrees C (68 and 77 degrees F).Get rid of any unused medicine after the expiration date. To get rid of medicines that are no longer needed or have expired: Take the medicine to a medicine take-back program. Check with your pharmacy or law enforcement to find a location. If you cannot return the medicine, check the label or package insert to see if the medicine should be thrown out in the garbage or flushed down the toilet. If you are not sure, ask your health care provider. If it is safe to put it in the trash, take the medicine out of the container. Mix the medicine with cat litter, dirt, coffee grounds, or other unwanted substance. Seal the mixture in a bag or container. Put it in the trash. NOTE: This sheet is a summary. It may not cover all possible information. If you have questions about this medicine, talk to your doctor,  pharmacist, orhealth care provider.  2022 Elsevier/Gold Standard (2020-09-03 11:20:03)

## 2021-05-20 ENCOUNTER — Telehealth: Payer: Self-pay | Admitting: *Deleted

## 2021-05-20 LAB — CBC WITH DIFFERENTIAL/PLATELET
Basophils Absolute: 0.1 10*3/uL (ref 0.0–0.2)
Basos: 1 %
EOS (ABSOLUTE): 0.1 10*3/uL (ref 0.0–0.4)
Eos: 1 %
Hematocrit: 38.9 % (ref 34.0–46.6)
Hemoglobin: 13.2 g/dL (ref 11.1–15.9)
Immature Grans (Abs): 0 10*3/uL (ref 0.0–0.1)
Immature Granulocytes: 0 %
Lymphocytes Absolute: 2.2 10*3/uL (ref 0.7–3.1)
Lymphs: 29 %
MCH: 31.8 pg (ref 26.6–33.0)
MCHC: 33.9 g/dL (ref 31.5–35.7)
MCV: 94 fL (ref 79–97)
Monocytes Absolute: 0.6 10*3/uL (ref 0.1–0.9)
Monocytes: 8 %
Neutrophils Absolute: 4.6 10*3/uL (ref 1.4–7.0)
Neutrophils: 61 %
Platelets: 258 10*3/uL (ref 150–450)
RBC: 4.15 x10E6/uL (ref 3.77–5.28)
RDW: 12.4 % (ref 11.7–15.4)
WBC: 7.6 10*3/uL (ref 3.4–10.8)

## 2021-05-20 LAB — COMPREHENSIVE METABOLIC PANEL
ALT: 15 IU/L (ref 0–32)
AST: 23 IU/L (ref 0–40)
Albumin/Globulin Ratio: 1.9 (ref 1.2–2.2)
Albumin: 4.6 g/dL (ref 3.9–5.0)
Alkaline Phosphatase: 56 IU/L (ref 44–121)
BUN/Creatinine Ratio: 14 (ref 9–23)
BUN: 11 mg/dL (ref 6–20)
Bilirubin Total: 0.2 mg/dL (ref 0.0–1.2)
CO2: 22 mmol/L (ref 20–29)
Calcium: 9.8 mg/dL (ref 8.7–10.2)
Chloride: 105 mmol/L (ref 96–106)
Creatinine, Ser: 0.77 mg/dL (ref 0.57–1.00)
Globulin, Total: 2.4 g/dL (ref 1.5–4.5)
Glucose: 95 mg/dL (ref 65–99)
Potassium: 4.2 mmol/L (ref 3.5–5.2)
Sodium: 139 mmol/L (ref 134–144)
Total Protein: 7 g/dL (ref 6.0–8.5)
eGFR: 109 mL/min/{1.73_m2} (ref >59)

## 2021-05-20 LAB — LYME DISEASE SEROLOGY W/REFLEX: Lyme Total Antibody EIA: NEGATIVE

## 2021-05-20 LAB — TSH: TSH: 1.69 u[IU]/mL (ref 0.450–4.500)

## 2021-05-20 NOTE — Telephone Encounter (Signed)
-----   Message from Anson Fret, MD sent at 05/20/2021  9:32 AM EDT ----- Please let patient know that her labs were normal. The Lyme test has not come back yet but if that is abnormal or concerning we will let her know, if it is normal we won;t call back. Thank you.

## 2021-05-24 NOTE — Telephone Encounter (Signed)
Determination Unfavorable

## 2021-05-25 ENCOUNTER — Other Ambulatory Visit (HOSPITAL_COMMUNITY): Payer: Self-pay

## 2021-05-26 ENCOUNTER — Other Ambulatory Visit: Payer: Self-pay

## 2021-05-26 ENCOUNTER — Encounter: Payer: Self-pay | Admitting: Neurology

## 2021-05-26 ENCOUNTER — Ambulatory Visit
Admission: RE | Admit: 2021-05-26 | Discharge: 2021-05-26 | Disposition: A | Discharge status: Home / Self Care | Payer: BLUE CROSS/BLUE SHIELD | Source: Ambulatory Visit | Attending: Neurology | Admitting: Neurology

## 2021-05-26 VITALS — BP 116/59 | HR 70

## 2021-05-26 DIAGNOSIS — R519 Headache, unspecified: Secondary | ICD-10-CM

## 2021-05-26 DIAGNOSIS — H547 Unspecified visual loss: Secondary | ICD-10-CM

## 2021-05-26 LAB — PROTEIN, CSF: Total Protein, CSF: 24 mg/dL (ref 15–45)

## 2021-05-26 LAB — CSF CELL COUNT WITH DIFFERENTIAL
RBC Count, CSF: 0 cells/uL
WBC, CSF: 0 cells/uL (ref 0–5)

## 2021-05-26 LAB — GLUCOSE, CSF: Glucose, CSF: 57 mg/dL (ref 40–80)

## 2021-05-26 NOTE — Discharge Instructions (Signed)

## 2021-05-30 DIAGNOSIS — G971 Other reaction to spinal and lumbar puncture: Secondary | ICD-10-CM

## 2021-05-31 ENCOUNTER — Telehealth: Payer: Self-pay

## 2021-05-31 NOTE — Telephone Encounter (Signed)
Received telephone call from pt stating she had a severe headache, thought to be a spinal headache. PT reports she has been drinking lots of fluids and lying flat for a few days now, since her LP on Wednesday. Advised the patient to call her ordering providers office and explain to them, pt might would need a Blood patch.  We received the order for the blood patch. I called the patient to tell her to come in today that we would work her in, but she stated she did not want a blood patch at this time. The patient stated "I am starting to feel a little better". The patient has my direct number if she changes her mind and we will be able to work her in for a blood patch. I did advise the patient to continue her bedrest and drinking extra fluids to help with the spinal headache for 24 hours. Pt verbalized understanding.

## 2021-05-31 NOTE — Telephone Encounter (Signed)
Per instructions from Dr Lucia Gaskins, verbal order placed for blood patch. Spoke with Olegario Messier @ [facility]. She is aware the blood patch order has been placed.

## 2021-07-01 ENCOUNTER — Other Ambulatory Visit: Payer: Self-pay | Admitting: Neurology

## 2021-07-01 MED ORDER — ONDANSETRON 4 MG PO TBDP: 4.0000 mg | ORAL_TABLET | Freq: Three times a day (TID) | ORAL | 3 refills | Status: DC | PRN

## 2021-07-30 ENCOUNTER — Other Ambulatory Visit: Payer: Self-pay

## 2021-07-30 ENCOUNTER — Emergency Department (HOSPITAL_COMMUNITY): Payer: BLUE CROSS/BLUE SHIELD

## 2021-07-30 ENCOUNTER — Emergency Department (HOSPITAL_COMMUNITY)
Admission: EM | Admit: 2021-07-30 | Discharge: 2021-07-31 | Disposition: A | Discharge status: Home / Self Care | Payer: BLUE CROSS/BLUE SHIELD | Attending: Emergency Medicine | Admitting: Emergency Medicine

## 2021-07-30 ENCOUNTER — Encounter (HOSPITAL_COMMUNITY): Payer: Self-pay

## 2021-07-30 DIAGNOSIS — Z20822 Contact with and (suspected) exposure to covid-19: Secondary | ICD-10-CM | POA: Diagnosis not present

## 2021-07-30 DIAGNOSIS — R531 Weakness: Secondary | ICD-10-CM | POA: Diagnosis not present

## 2021-07-30 DIAGNOSIS — Z79899 Other long term (current) drug therapy: Secondary | ICD-10-CM | POA: Insufficient documentation

## 2021-07-30 DIAGNOSIS — R519 Headache, unspecified: Secondary | ICD-10-CM | POA: Insufficient documentation

## 2021-07-30 DIAGNOSIS — I639 Cerebral infarction, unspecified: Secondary | ICD-10-CM | POA: Diagnosis not present

## 2021-07-30 DIAGNOSIS — Y900 Blood alcohol level of less than 20 mg/100 ml: Secondary | ICD-10-CM | POA: Diagnosis not present

## 2021-07-30 DIAGNOSIS — G43109 Migraine with aura, not intractable, without status migrainosus: Secondary | ICD-10-CM

## 2021-07-30 LAB — CBC
HCT: 40.4 % (ref 36.0–46.0)
Hemoglobin: 13.9 g/dL (ref 12.0–15.0)
MCH: 32.6 pg (ref 26.0–34.0)
MCHC: 34.4 g/dL (ref 30.0–36.0)
MCV: 94.6 fL (ref 80.0–100.0)
Platelets: 275 10*3/uL (ref 150–400)
RBC: 4.27 MIL/uL (ref 3.87–5.11)
RDW: 11.7 % (ref 11.5–15.5)
WBC: 6.7 10*3/uL (ref 4.0–10.5)
nRBC: 0 % (ref 0.0–0.2)

## 2021-07-30 LAB — I-STAT CHEM 8, ED
BUN: 13 mg/dL (ref 6–20)
Calcium, Ion: 1.23 mmol/L (ref 1.15–1.40)
Chloride: 107 mmol/L (ref 98–111)
Creatinine, Ser: 0.8 mg/dL (ref 0.44–1.00)
Glucose, Bld: 110 mg/dL — ABNORMAL HIGH (ref 70–99)
HCT: 40 % (ref 36.0–46.0)
Hemoglobin: 13.6 g/dL (ref 12.0–15.0)
Potassium: 3.8 mmol/L (ref 3.5–5.1)
Sodium: 141 mmol/L (ref 135–145)
TCO2: 24 mmol/L (ref 22–32)

## 2021-07-30 LAB — COMPREHENSIVE METABOLIC PANEL
ALT: 17 U/L (ref 0–44)
AST: 17 U/L (ref 15–41)
Albumin: 4.3 g/dL (ref 3.5–5.0)
Alkaline Phosphatase: 43 U/L (ref 38–126)
Anion gap: 8 (ref 5–15)
BUN: 13 mg/dL (ref 6–20)
CO2: 23 mmol/L (ref 22–32)
Calcium: 9.5 mg/dL (ref 8.9–10.3)
Chloride: 107 mmol/L (ref 98–111)
Creatinine, Ser: 0.95 mg/dL (ref 0.44–1.00)
GFR, Estimated: >60 mL/min (ref >60)
Glucose, Bld: 116 mg/dL — ABNORMAL HIGH (ref 70–99)
Potassium: 3.8 mmol/L (ref 3.5–5.1)
Sodium: 138 mmol/L (ref 135–145)
Total Bilirubin: 0.4 mg/dL (ref 0.3–1.2)
Total Protein: 7.2 g/dL (ref 6.5–8.1)

## 2021-07-30 LAB — DIFFERENTIAL
Abs Immature Granulocytes: 0.03 10*3/uL (ref 0.00–0.07)
Basophils Absolute: 0.1 10*3/uL (ref 0.0–0.1)
Basophils Relative: 1 %
Eosinophils Absolute: 0 10*3/uL (ref 0.0–0.5)
Eosinophils Relative: 1 %
Immature Granulocytes: 0 %
Lymphocytes Relative: 22 %
Lymphs Abs: 1.5 10*3/uL (ref 0.7–4.0)
Monocytes Absolute: 0.4 10*3/uL (ref 0.1–1.0)
Monocytes Relative: 6 %
Neutro Abs: 4.7 10*3/uL (ref 1.7–7.7)
Neutrophils Relative %: 70 %

## 2021-07-30 LAB — RESP PANEL BY RT-PCR (FLU A&B, COVID) ARPGX2
Influenza A by PCR: NEGATIVE
Influenza B by PCR: NEGATIVE
SARS Coronavirus 2 by RT PCR: NEGATIVE

## 2021-07-30 LAB — I-STAT BETA HCG BLOOD, ED (MC, WL, AP ONLY): I-stat hCG, quantitative: <5 m[IU]/mL (ref <5)

## 2021-07-30 LAB — PROTIME-INR
INR: 1 (ref 0.8–1.2)
Prothrombin Time: 13.1 seconds (ref 11.4–15.2)

## 2021-07-30 LAB — APTT: aPTT: 32 seconds (ref 24–36)

## 2021-07-30 LAB — ETHANOL: Alcohol, Ethyl (B): <10 mg/dL (ref <10)

## 2021-07-30 MED ORDER — DIPHENHYDRAMINE HCL 50 MG/ML IJ SOLN
25.0000 mg | Freq: Once | INTRAMUSCULAR | Status: AC
  Administered 2021-07-30: 25 mg via INTRAVENOUS
  Filled 2021-07-30: qty 1

## 2021-07-30 MED ORDER — PROCHLORPERAZINE EDISYLATE 10 MG/2ML IJ SOLN: 10.0000 mg | Freq: Once | INTRAMUSCULAR | Status: DC

## 2021-07-30 MED ORDER — KETOROLAC TROMETHAMINE 15 MG/ML IJ SOLN
15.0000 mg | Freq: Once | INTRAMUSCULAR | Status: AC
  Administered 2021-07-30: 15 mg via INTRAVENOUS
  Filled 2021-07-30: qty 1

## 2021-07-30 MED ORDER — GADOBUTROL 1 MMOL/ML IV SOLN
9.5000 mL | Freq: Once | INTRAVENOUS | Status: AC | PRN
  Administered 2021-07-30: 9.5 mL via INTRAVENOUS

## 2021-07-30 NOTE — ED Triage Notes (Signed)
Pt BIB [facility] from [facility] d/t Migraine that started on Tuesday, it progressively worsened & she began to feel Left leg numbness & Left facial numbness & reports drooling. 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 EMS reports  148/76, 90 bpm, NSR, CBG 114 98% on RA Resp 16.

## 2021-07-30 NOTE — ED Provider Notes (Signed)
[facility] EMERGENCY DEPARTMENT Provider Note   CSN: [id number] Arrival date & time: 07/30/21  1808     History Chief Complaint  Patient presents with   Migraine & Stroke-like s/s    [name] Sanchez is a 25 y.o. female.  HPI  25 year old female with a past medical history of anxiety, depression, obesity, IBS presenting to the emergency department with headache and left-sided weakness.  Patient reports that she has a history of migraines and has been experiencing a headache for the past 3 to 4 days.  It is pulsatile, primarily left-sided.  She states it feels similar to migraines in the past.  She states that last night, she began to experience weakness in her left leg and left arm.  She states that she had a similar presentation in May of this year, at which point she was referred to neurology for possible IH.  She states that her symptoms resolved after she got a migraine cocktail.  She states that she underwent a lumbar puncture but her CSF pressures were not elevated enough to warrant treatment for her neurologist that she is not currently on any medication for I IH.  She states that she went to her primary care doctor today and reported that she also began to feel some left numbness in her arm and her face, and her primary care doctor recommended that she go to the emergency department to receive a contrasted MRI.  The patient denies any fever, neck pain.  She denies any recent falls or trauma to her head or neck.  No history of demyelinating disease in the family.  Past Medical History:  Diagnosis Date   Anxiety    Complication of anesthesia    wakes up crying   Depression    Endometriosis    Family history of adverse reaction to anesthesia    hard to wake up, low heart rate   Gestational hypertension 01/2012   also had postpartum preelampsia   History of stomach ulcers    Hx of ovarian cyst    x 2-3   Hx of varicella    IBS (irritable bowel syndrome)     Migraine    chronic   Panic attack    Pinched nerve    left arm   Plica of knee 08/2012   medial plica left knee    Patient Active Problem List   Diagnosis Date Noted   Migraine with aura and with status migrainosus, not intractable 05/19/2021   BMI 37.0-37.9, adult 02/11/2019   Swelling of lymph node 10/02/2018   Seasonal affective disorder (HCC) 09/19/2018   Urinary frequency 09/19/2018   Status post hysterectomy 08/14/2018   Acute post-traumatic headache, not intractable 07/30/2018   Pelvic pain 07/31/2017   Migraine without aura and without status migrainosus, not intractable 05/06/2016   Depression with anxiety 04/04/2012   Healthcare maintenance 04/01/2012   Migraine with aura and without status migrainosus, not intractable     Past Surgical History:  Procedure Laterality Date   KNEE ARTHROSCOPY  08/17/2012   Procedure: ARTHROSCOPY KNEE;  Surgeon: Harvie Junior, MD;  Location: [facility];  Service: Orthopedics;  Laterality: Left;  left knee scope with tight lateral retinacular release and plica removal   TYMPANOSTOMY TUBE PLACEMENT Bilateral 1998   VAGINAL HYSTERECTOMY Bilateral 08/14/2018   Procedure: HYSTERECTOMY VAGINAL WITH SALPINGECTOMY;  Surgeon: Reva Bores, MD;  Location: [facility];  Service: Gynecology;  Laterality: Bilateral;     OB History  Gravida  3   Para  2   Term  2   Preterm      AB  1   Living  2      SAB      IAB  1   Ectopic      Multiple      Live Births  2           Family History  Problem Relation Age of Onset   Hypertension Mother    Heart disease Mother        MI   Anesthesia problems Mother        post-op N/V   Depression Mother    Anxiety disorder Mother    Obesity Mother    Congestive Heart Failure Mother    Asthma Sister    Bipolar disorder Sister    Asthma Brother    Diabetes Maternal Aunt    Heart disease Father    Bipolar disorder Father    Hyperlipidemia Father    Uterine cancer  Maternal Grandmother    Pancreatic cancer Maternal Grandmother    Lung cancer Maternal Grandfather     Social History   Tobacco Use   Smoking status: Never   Smokeless tobacco: Never  Vaping Use   Vaping Use: Never used  Substance Use Topics   Alcohol use: Not Currently    Alcohol/week: 0.0 standard drinks    Comment: occassionally   Drug use: No    Home Medications Prior to Admission medications   Medication Sig Start Date End Date Taking? Authorizing Provider  ALPRAZolam Prudy Feeler) 0.5 MG tablet Take 1 tablet (0.5 mg total) by mouth daily as needed for anxiety. 11/12/18   Danford, Orpha Bur D, NP  Atogepant (QULIPTA) 60 MG TABS Take 60 mg by mouth daily. 05/19/21   Anson Fret, MD  Atogepant (QULIPTA) 60 MG TABS Take 60 mg by mouth daily. 05/19/21   Anson Fret, MD  FLUoxetine (PROZAC) 40 MG capsule Take 1 capsule (40 mg total) by mouth daily. Patient taking differently: Take 20 mg by mouth daily. 02/11/19   Danford, Orpha Bur D, NP  metoCLOPramide (REGLAN) 10 MG tablet Take 1 tablet (10 mg total) by mouth 3 (three) times daily as needed. For nausea or migraine. 09/29/20   Lomax, Amy, NP  ondansetron (ZOFRAN-ODT) 4 MG disintegrating tablet Take 1-2 tablets (4-8 mg total) by mouth every 8 (eight) hours as needed. 07/01/21   Anson Fret, MD  tiZANidine (ZANAFLEX) 4 MG tablet Take 1 tablet (4 mg total) by mouth every 6 (six) hours as needed for muscle spasms. Or headaches. 01/08/20   Lomax, Amy, NP    Allergies    Percocet [oxycodone-acetaminophen], Cefuroxime axetil, and Penicillins  Review of Systems   Review of Systems  Constitutional:  Negative for chills and fever.  HENT:  Negative for ear pain and sore throat.   Eyes:  Negative for pain and visual disturbance.  Respiratory:  Negative for cough and shortness of breath.   Cardiovascular:  Negative for chest pain and palpitations.  Gastrointestinal:  Negative for abdominal pain and vomiting.  Genitourinary:  Negative for  dysuria and hematuria.  Musculoskeletal:  Negative for arthralgias and back pain.  Skin:  Negative for color change and rash.  Neurological:  Positive for weakness, light-headedness and headaches. Negative for seizures and syncope.  All other systems reviewed and are negative.  Physical Exam Updated Vital Signs BP 116/80   Pulse 82   Temp 99.6 F (37.6  C) (Oral)   Resp 16   Ht 5\' 4"  (1.626 m)   Wt 93 kg   LMP 07/15/2018 (Exact Date)   SpO2 100%   BMI 35.19 kg/m   Physical Exam Vitals and nursing note reviewed.  Constitutional:      General: She is not in acute distress.    Appearance: Normal appearance. She is well-developed. She is obese. She is not ill-appearing or toxic-appearing.  HENT:     Head: Normocephalic and atraumatic.  Eyes:     Conjunctiva/sclera: Conjunctivae normal.  Cardiovascular:     Rate and Rhythm: Normal rate and regular rhythm.     Heart sounds: No murmur heard. Pulmonary:     Effort: Pulmonary effort is normal. No respiratory distress.     Breath sounds: Normal breath sounds.  Abdominal:     Palpations: Abdomen is soft.     Tenderness: There is no abdominal tenderness.  Musculoskeletal:     Cervical back: Neck supple.  Skin:    General: Skin is warm and dry.     Capillary Refill: Capillary refill takes less than 2 seconds.  Neurological:     Mental Status: She is alert and oriented to person, place, and time.     Comments: No visual field cuts.  Extraocular movements are intact.  Reports subjectively decreased sensation to the left hemiface and and left hemibody.  When strength testing the bilateral lower extremities, patient is able to raise her right leg against gravity and I can feel her left heel pushing down into my hand.  When asking the patient to lift her left leg, she exhibits poor effort demonstrated by a lack of pressure from her right leg pushing into my hand on the bed.  When testing her grip in the left hand, the patient has a weak  grip, but she is able to hold her arm against gravity during grip testing.  Fluctuating strength when testing the flexion and extension of the bilateral elbows.    ED Results / Procedures / Treatments   Labs (all labs ordered are listed, but only abnormal results are displayed) Labs Reviewed  COMPREHENSIVE METABOLIC PANEL - Abnormal; Notable for the following components:      Result Value   Glucose, Bld 116 (*)    All other components within normal limits  I-STAT CHEM 8, ED - Abnormal; Notable for the following components:   Glucose, Bld 110 (*)    All other components within normal limits  RESP PANEL BY RT-PCR (FLU A&B, COVID) ARPGX2  ETHANOL  PROTIME-INR  APTT  CBC  DIFFERENTIAL  RAPID URINE DRUG SCREEN, HOSP PERFORMED  URINALYSIS, ROUTINE W REFLEX MICROSCOPIC  I-STAT BETA HCG BLOOD, ED ([facility], [facility], [facility] ONLY)    EKG EKG Interpretation  Date/Time:  Friday July 30 2021 18:30:59 EDT Ventricular Rate:  99 PR Interval:  138 QRS Duration: 82 QT Interval:  328 QTC Calculation: 420 R Axis:   73 Text Interpretation: Normal sinus rhythm Normal ECG Confirmed by Jacalyn LefevreHaviland, Julie [phone]([id number]) on 07/30/2021 7:01:54 PM  Radiology CT HEAD WO CONTRAST  Result Date: 07/30/2021 CLINICAL DATA:  Neuro deficit. Acute stroke suspected. Left side weakness, arm, leg and face EXAM: CT HEAD WITHOUT CONTRAST TECHNIQUE: Contiguous axial images were obtained from the base of the skull through the vertex without intravenous contrast. COMPARISON:  None. FINDINGS: Brain: No evidence of large-territorial acute infarction. No parenchymal hemorrhage. No mass lesion. No extra-axial collection. No mass effect or midline shift. No hydrocephalus. Basilar cisterns are  patent. Vascular: No hyperdense vessel. Skull: No acute fracture or focal lesion. Sinuses/Orbits: Paranasal sinuses and mastoid air cells are clear. The orbits are unremarkable. Other: None. IMPRESSION: No acute intracranial abnormality. Electronically Signed    By: Tish Frederickson M.D.   On: 07/30/2021 19:15    Procedures Procedures   Medications Ordered in ED Medications  prochlorperazine (COMPAZINE) injection 10 mg (has no administration in time range)  diphenhydrAMINE (BENADRYL) injection 25 mg (25 mg Intravenous Given 07/30/21 2136)  ketorolac (TORADOL) 15 MG/ML injection 15 mg (15 mg Intravenous Given 07/30/21 2135)  gadobutrol (GADAVIST) 1 MMOL/ML injection 9.5 mL (9.5 mLs Intravenous Contrast Given 07/30/21 2324)   ED Course  I have reviewed the triage vital signs and the nursing notes.  Pertinent labs & imaging results that were available during my care of the patient were reviewed by me and considered in my medical decision making (see chart for details).    MDM Rules/Calculators/A&P                           25 year old female with above past medical history presenting to the emergency department with a headache and left sided weakness and numbness.  Differential includes stroke, complex migraine, Todd's paralysis, idiopathic intracranial hypertension.  Vital signs reviewed on arrival, within acceptable limits.  Her blood sugar was within normal limits.  Given the patient's remitting and recurring nature of her symptoms, demyelinating disease remains in the differential but given her normal vision exam her symptoms do not seem typical of that.  The patient has fluctuating strength on her neurologic exam, I do not believe that she has suffered a stroke.  She has no history of seizures, therefore Todd's paralysis is less likely.  I am most concerned for a complex migraine, but we will obtain an MRI with and without contrast to evaluate for demyelinating lesion or other acute intracranial pathology.  In the meantime, we will treat her empirically with Benadryl, Toradol, and Compazine and reassess the patient.  Plan of care signed out to oncoming provider.  Patient is still at MRI at the time of signout, unable to reassess the patient prior to  transfer care.  Please see the ED provider's note for the remainder of her ED care and ultimate disposition. Final Clinical Impression(s) / ED Diagnoses Final diagnoses:  Nonintractable headache, unspecified chronicity pattern, unspecified headache type    Rx / DC Orders ED Discharge Orders     None        Lenard Lance, MD 07/30/21 7782    Jacalyn Lefevre, MD 08/04/21 [phone]

## 2021-07-30 NOTE — ED Notes (Signed)
Pt stated she doesn't want to take compazine because she had a bad reaction to it. Pt requested to wait to take the benadryl and Toradol until after the CT scan.

## 2021-07-30 NOTE — ED Provider Notes (Signed)
Emergency Medicine Provider Triage Evaluation Note  [name] Sanchez , a 25 y.o. female  was evaluated in triage.  Pt complains of ongoing headaches.  She states that she has had headache ongoing.  Her headache got bad yesterday.  She states that today at about 1130 she noted that she started having left-sided weakness and paresthesias.  She feels like this is continuing to progress.  She states that she has vision changes at baseline with migraines, however this is her usual  She has never had hemiplegic migraine before. .  Review of Systems  Positive: Headache, arm and leg weakness and numbness, drooling Negative: Fevers, chest pain  Physical Exam  BP 135/85 (BP Location: Right Arm)   Pulse (!) 101   Temp 99.6 F (37.6 C) (Oral)   Resp 16   Ht 5\' 4"  (1.626 m)   Wt 93 kg   LMP 07/15/2018 (Exact Date)   SpO2 100%   BMI 35.19 kg/m  Gen:   Awake Resp:  Normal effort  MSK:   Left-sided grip 3 or 4/5, right-sided is intact.  She right side she does not have any pronator drift, is able to lift leg, 5/5 strength.  Left side she is unable to hold her arm above gravity.   Other: Speech is not slurred, no obvious facial droop.  Medical Decision Making  Medically screening exam initiated at 6:55 PM.  Appropriate orders placed.  KARENNA ROMANOFF was informed that the remainder of the evaluation will be completed by another provider, this initial triage assessment does not replace that evaluation, and the importance of remaining in the ED until their evaluation is complete.   Last known well 1130am  1855: called charge, informed them that patient needs room.   Patient is outside of [facility] window.  Last known well 1130 am.  She feels like her numbness and weakness are worsening.   Note: Portions of this report may have been transcribed using voice recognition software. Every effort was made to ensure accuracy; however, inadvertent computerized transcription errors may be present    Fidela Salisbury 07/30/21 2155    2156, MD 08/02/21 [phone]

## 2021-07-30 NOTE — ED Provider Notes (Signed)
"25 year old female with a past medical history of anxiety, depression, obesity, IBS presenting to the emergency department with headache and left-sided weakness.  Patient reports that she has a history of migraines and has been experiencing a headache for the past 3 to 4 days.  It is pulsatile, primarily left-sided.  She states it feels similar to migraines in the past.  She states that last night, she began to experience weakness in her left leg and left arm.  She states that she had a similar presentation in May of this year, at which point she was referred to neurology for possible IH.  She states that her symptoms resolved after she got a migraine cocktail.  She states that she underwent a lumbar puncture but her CSF pressures were not elevated enough to warrant treatment for her neurologist that she is not currently on any medication for I IH.  She states that she went to her primary care doctor today and reported that she also began to feel some left numbness in her arm and her face, and her primary care doctor recommended that she go to the emergency department to receive a contrasted MRI.  The patient denies any fever, neck pain.  She denies any recent falls or trauma to her head or neck.  No history of demyelinating disease in the family."  Physical Exam  BP 106/68   Pulse 62   Temp 99.6 F (37.6 C) (Oral)   Resp 16   Ht 5\' 4"  (1.626 m)   Wt 93 kg   LMP 07/15/2018 (Exact Date)   SpO2 98%   BMI 35.19 kg/m   Physical Exam Vitals and nursing note reviewed.  Constitutional:      Appearance: She is well-developed.  HENT:     Head: Normocephalic and atraumatic.  Musculoskeletal:        General: Normal range of motion.     Cervical back: Normal range of motion.  Neurological:     Mental Status: She is alert and oriented to person, place, and time.     Comments: Patient has full active and passive range of motion of the arms and legs.  No pronator drift.  Good strength against resistance  of the arms and legs.  Sensations intact and equal throughout.  GCS 15.  Speaks in complete, fluent sentences.    ED Course/Procedures     Procedures  MDM   25 year old female received at signout from Dr. 31, ED resident, pending MRI.  Please see his note for further work-up and medical decision making.  MRI has been reviewed independently interpreted by me.  There is a single punctate focus of FLAIR within the subcortical white matter of the high frontal lobe.  No other specific findings.  No encephalomalacia or infarction.  Discussed these findings with the patient.  She is already established with neurology.  Encouraged outpatient follow-up.  She was given Toradol and Benadryl, but had declined Compazine prior to MRI.  She reports that her symptoms have not improved.  Patient was administered Compazine and Decadron, and a reevaluation, her headache had almost entirely resolved as well as her extremity deficits.  She has no drooling on my exam.  She reports that she is back to baseline.  At this time, suspect complex migraine.  She reports feeling much improved and is ready for discharge.  She will follow-up with her neurologist.  She is hemodynamically stable in no acute distress.  Safer discharge home with outpatient follow-up as discussed.  Barkley Boards, PA-C 07/31/21 0446    Marily Memos, MD 07/31/21 [phone]

## 2021-07-31 LAB — RAPID URINE DRUG SCREEN, HOSP PERFORMED
Amphetamines: NOT DETECTED
Barbiturates: NOT DETECTED
Benzodiazepines: NOT DETECTED
Cocaine: NOT DETECTED
Opiates: NOT DETECTED
Tetrahydrocannabinol: NOT DETECTED

## 2021-07-31 LAB — URINALYSIS, ROUTINE W REFLEX MICROSCOPIC
Bilirubin Urine: NEGATIVE
Glucose, UA: NEGATIVE mg/dL
Hgb urine dipstick: NEGATIVE
Ketones, ur: NEGATIVE mg/dL
Leukocytes,Ua: NEGATIVE
Nitrite: NEGATIVE
Protein, ur: NEGATIVE mg/dL
Specific Gravity, Urine: 1.015 (ref 1.005–1.030)
pH: 6 (ref 5.0–8.0)

## 2021-07-31 MED ORDER — PROCHLORPERAZINE EDISYLATE 10 MG/2ML IJ SOLN
10.0000 mg | Freq: Once | INTRAMUSCULAR | Status: AC
  Administered 2021-07-31: 10 mg via INTRAVENOUS
  Filled 2021-07-31: qty 2

## 2021-07-31 MED ORDER — DEXAMETHASONE SODIUM PHOSPHATE 10 MG/ML IJ SOLN
10.0000 mg | Freq: Once | INTRAMUSCULAR | Status: AC
  Administered 2021-07-31: 10 mg via INTRAVENOUS
  Filled 2021-07-31: qty 1

## 2021-07-31 MED ORDER — DIPHENHYDRAMINE HCL 50 MG/ML IJ SOLN
25.0000 mg | Freq: Once | INTRAMUSCULAR | Status: AC
  Administered 2021-07-31: 25 mg via INTRAVENOUS
  Filled 2021-07-31: qty 1

## 2021-07-31 NOTE — ED Notes (Signed)
Provided pt with graham crackers and peanut butter.

## 2021-07-31 NOTE — Discharge Instructions (Signed)
Thank you for allowing me to care for you today in the Emergency Department.   Follow up with your neurologist.   Return to the emergency department for new or worsening symptoms.

## 2021-07-31 NOTE — ED Notes (Signed)
Assisted pt on bedside commode. Pt was able to stand with steady balance but felt numbness still.

## 2021-07-31 NOTE — ED Notes (Signed)
EDP at the bedside.  ?

## 2021-08-03 ENCOUNTER — Encounter: Payer: Self-pay | Admitting: Neurology

## 2021-08-03 ENCOUNTER — Telehealth: Payer: Self-pay

## 2021-08-03 ENCOUNTER — Ambulatory Visit: Payer: BLUE CROSS/BLUE SHIELD | Admitting: Neurology

## 2021-08-03 VITALS — BP 113/80 | HR 121 | Ht 64.0 in | Wt 207.6 lb

## 2021-08-03 DIAGNOSIS — G43711 Chronic migraine without aura, intractable, with status migrainosus: Secondary | ICD-10-CM | POA: Diagnosis not present

## 2021-08-03 DIAGNOSIS — G43109 Migraine with aura, not intractable, without status migrainosus: Secondary | ICD-10-CM

## 2021-08-03 MED ORDER — PROMETHAZINE HCL 12.5 MG PO TABS: 12.5000 mg | ORAL_TABLET | Freq: Four times a day (QID) | ORAL | 6 refills | Status: DC | PRN

## 2021-08-03 MED ORDER — AIMOVIG 140 MG/ML ~~LOC~~ SOAJ
140.0000 mg | SUBCUTANEOUS | 11 refills | Status: DC
Start: 2021-08-03 — End: 2024-07-27

## 2021-08-03 MED ORDER — METOCLOPRAMIDE HCL 10 MG PO TABS: 10.0000 mg | ORAL_TABLET | Freq: Three times a day (TID) | ORAL | 5 refills | Status: DC | PRN

## 2021-08-03 MED ORDER — ONDANSETRON 4 MG PO TBDP: 4.0000 mg | ORAL_TABLET | Freq: Three times a day (TID) | ORAL | 3 refills | Status: DC | PRN

## 2021-08-03 MED ORDER — KETOROLAC TROMETHAMINE 60 MG/2ML IM SOLN
60.0000 mg | Freq: Once | INTRAMUSCULAR | Status: AC
  Administered 2021-08-03: 60 mg via INTRAMUSCULAR

## 2021-08-03 MED ORDER — UBRELVY 100 MG PO TABS: 100.0000 mg | ORAL_TABLET | ORAL | 0 refills | Status: AC | PRN

## 2021-08-03 MED ORDER — ACETAZOLAMIDE 250 MG PO TABS: 250.0000 mg | ORAL_TABLET | Freq: Two times a day (BID) | ORAL | 6 refills | Status: DC

## 2021-08-03 MED ORDER — NURTEC 75 MG PO TBDP: 75.0000 mg | ORAL_TABLET | Freq: Every day | ORAL | 11 refills | Status: DC | PRN

## 2021-08-03 MED ORDER — TIZANIDINE HCL 4 MG PO TABS: 4.0000 mg | ORAL_TABLET | Freq: Four times a day (QID) | ORAL | 6 refills | Status: AC | PRN

## 2021-08-03 MED ORDER — METHYLPREDNISOLONE 4 MG PO TBPK: ORAL_TABLET | ORAL | 1 refills | Status: DC

## 2021-08-03 NOTE — Patient Instructions (Signed)
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 Medrol dose pak 6 days  Ubrogepant tablets What is this medication? UBROGEPANT (ue BROE je pant) is used to treat migraine headaches with or without aura. An aura is a strange feeling or visual disturbance that warns you of an attack. It is not used to prevent migraines. This medicine may be used for other purposes; ask your health care provider or pharmacist if you have questions. COMMON BRAND NAME(S): Bernita Raisin What should I tell my care team before I take this medication? They need to know if you have any of these conditions: kidney disease liver disease an unusual or allergic reaction to ubrogepant, other medicines, foods, dyes, or preservatives pregnant or trying to get pregnant breast-feeding How should I use this medication? Take this medicine by mouth with a glass of water. Follow the directions on the prescription label. You can take it with or without food. If it upsets your stomach, take it with food. Take your medicine at regular intervals. Do not take it more often than directed. Do not stop taking except on your doctor's advice. Talk to your pediatrician about the use of this medicine in children. Special care may be needed. Overdosage: If you think you have taken too much of this medicine contact a poison control center or emergency room at once. NOTE: This medicine is only for you. Do not share this medicine with others. What if I miss a dose? This does not apply. This medicine is not for regular use. What may interact with this medication? Do not take this medicine with any of the following medicines: ceritinib certain antibiotics like chloramphenicol, clarithromycin, telithromycin certain antivirals for HIV like atazanavir, cobicistat, darunavir, delavirdine, fosamprenavir,  indinavir, ritonavir certain medicines for fungal infections like itraconazole, ketoconazole, posaconazole, voriconazole conivaptan grapefruit idelalisib mifepristone nefazodone ribociclib This medicine may also interact with the following medications: carvedilol certain medicines for seizures like phenobarbital, phenytoin ciprofloxacin cyclosporine eltrombopag fluconazole fluvoxamine quinidine rifampin [facility] John's wort verapamil This list may not describe all possible interactions. Give your health care provider a list of all the medicines, herbs, non-prescription drugs, or dietary supplements you use. Also tell them if you smoke, drink alcohol, or use illegal drugs. Some items may interact with your medicine. What should I watch for while using this medication? Visit your health care professional for regular checks on your progress. Tell your health care professional if your symptoms do not start to get better or if they get worse. Your mouth may get dry. Chewing sugarless gum or sucking hard candy and drinking plenty of water may help. Contact your health care professional if the problem does not go away or is severe. What side effects may I notice from receiving this medication? Side effects that you should report to your doctor or health care professional as soon as possible: allergic reactions like skin rash, itching or hives; swelling of the face, lips, or tongue Side effects that usually do not require medical attention (report these to your doctor or health care professional if they continue or are bothersome): drowsiness dry mouth nausea tiredness This list may not describe all possible side effects. Call your doctor for medical advice about side effects. You may report side effects to FDA at 1-800-FDA-1088. Where should I keep my medication? Keep out of the reach of children. Store at room temperature between 15 and 30  degrees C (59 and 86 degrees F). Throw away any unused  medicine after the expiration date. NOTE: This sheet is a summary. It may not cover all possible information. If you have questions about this medicine, talk to your doctor, pharmacist, or health care provider.  2022 [employer]/Gold Standard (2019-02-07 08:50:55) Acetazolamide Oral Tablets What is this medication? ACETAZOLAMIDE (a set a ZOLE a mide) is a diuretic. It helps you make more urine and to lose salt and excess water from your body. It treats swelling from heart disease. It helps treat some seizures and some kinds of glaucoma. It alsotreats and prevents symptoms of altitude sickness (acute mountain sickness). This medicine may be used for other purposes; ask your health care provider orpharmacist if you have questions. COMMON BRAND NAME(S): Diamox What should I tell my care team before I take this medication? They need to know if you have any of these conditions: glaucoma kidney disease liver disease low adrenal gland function lung or breathing disease (COPD, chronic bronchitis, emphysema) an unusual or allergic reaction to acetazolamide, sulfa drugs, other drugs, foods, dyes or preservatives pregnant or trying to get pregnant breast-feeding How should I use this medication? Take this drug by mouth. Take it as directed on the prescription label at the same time every day. You can take it with or without food. If it upsets your stomach, take it with food. Keep taking it unless your health care providertells you to stop. Talk to your health care provider about the use of this drug in children.Special care may be needed. Overdosage: If you think you have taken too much of this medicine contact apoison control center or emergency room at once. NOTE: This medicine is only for you. Do not share this medicine with others. What if I miss a dose? If you miss a dose, take it as soon as you can. If it is almost time for yournext dose, take only that dose. Do not take double or extra doses. What  may interact with this medication? Do not take this medicine with any of the following medications: methazolamide This medicine may also interact with the following medications: aspirin and aspirin-like medicines cyclosporine lithium medicine for diabetes methenamine other diuretics phenytoin primidone quinidine sodium bicarbonate stimulant medicines like dextroamphetamine This list may not describe all possible interactions. Give your health care provider a list of all the medicines, herbs, non-prescription drugs, or dietary supplements you use. Also tell them if you smoke, drink alcohol, or use illegaldrugs. Some items may interact with your medicine. What should I watch for while using this medication? Visit your health care provider for regular checks on your progress. Tell your health care provider if your symptoms do not start to get better or if they getworse. This drug may cause serious skin reactions. They can happen weeks to months after starting the drug. Contact your health care provider right away if you notice fevers or flu-like symptoms with a rash. The rash may be red or purple and then turn into blisters or peeling of the skin. Or, you might notice a red rash with swelling of the face, lips or lymph nodes in your neck or under yourarms. You may get drowsy or dizzy. Do not drive, use machinery, or do anything that needs mental alertness until you know how this drug affects you. Do not stand up or sit up quickly, especially if you are an older patient. This reduces therisk of dizzy or fainting spells. What side effects may I notice from  receiving this medication? Side effects that you should report to your doctor or health care provider assoon as possible: allergic reactions (skin rash, itching or hives; swelling of the face, lips, or tongue) high acid levels (trouble breathing; fast, irregular heartbeat; headache; confusion; unusually weak or tired; nausea, vomiting) infection  (fever, chills, cough, sore throat, pain or trouble passing urine) liver injury (dark yellow or brown urine; general ill feeling or flu-like symptoms; loss of appetite, right upper belly pain; unusually weak or tired, yellowing of the eyes or skin) low red blood cell counts (trouble breathing; feeling faint; lightheaded, falls; unusually weak or tired) redness, blistering, peeling, or loosening of the skin, including inside the mouth unusual bruising or bleeding Side effects that usually do not require medical attention (report to yourdoctor or health care provider if they continue or are bothersome): decreased hearing, ringing of the ears diarrhea increased thirst kidney stones (blood in the urine; pain when urinating; pain the lower back or side) loss of appetite feeling faint or lightheaded, falls; muscle cramps or pain) nausea pain, tingling, numbness in the hands or feet unusual sweating vomiting This list may not describe all possible side effects. Call your doctor for medical advice about side effects. You may report side effects to FDA at1-800-FDA-1088. Where should I keep my medication? Keep out of the reach of children and pets. Store at room temperature between 20 and 25 degrees C (68 and 77 degrees F).Throw away any unused drug after the expiration date. NOTE: This sheet is a summary. It may not cover all possible information. If you have questions about this medicine, talk to your doctor, pharmacist, orhealth care provider.  2022 [employer]/Gold Standard (2019-09-17 12:44:24) Erenumab injection What is this medication? ERENUMAB (e REN ue mab) is used to prevent migraine headaches. This medicine may be used for other purposes; ask your health care provider or pharmacist if you have questions. COMMON BRAND NAME(S): Aimovig What should I tell my care team before I take this medication? They need to know if you have any of these conditions: an unusual or allergic reaction to  erenumab, latex, other medicines, foods, dyes, or preservatives high blood pressure pregnant or trying to get pregnant breast-feeding How should I use this medication? This medicine is for injection under the skin. You will be taught how to prepare and give this medicine. Use exactly as directed. Take your medicine at regular intervals. Do not take your medicine more often than directed. It is important that you put your used needles and syringes in a special sharps container. Do not put them in a trash can. If you do not have a sharps container, call your pharmacist or healthcare provider to get one. Talk to your pediatrician regarding the use of this medicine in children. Special care may be needed. Overdosage: If you think you have taken too much of this medicine contact a poison control center or emergency room at once. NOTE: This medicine is only for you. Do not share this medicine with others. What if I miss a dose? If you miss a dose, take it as soon as you can. If it is almost time for your next dose, take only that dose. Do not take double or extra doses. What may interact with this medication? Interactions are not expected. This list may not describe all possible interactions. Give your health care provider a list of all the medicines, herbs, non-prescription drugs, or dietary supplements you use. Also tell them if you smoke, drink alcohol, or  use illegal drugs. Some items may interact with your medicine. What should I watch for while using this medication? Tell your doctor or healthcare professional if your symptoms do not start to get better or if they get worse. What side effects may I notice from receiving this medication? Side effects that you should report to your doctor or health care professional as soon as possible: allergic reactions like skin rash, itching or hives, swelling of the face, lips, or tongue chest pain fast, irregular heartbeat feeling faint or  lightheaded palpitations Side effects that usually do not require medical attention (report these to your doctor or health care professional if they continue or are bothersome): constipation muscle cramps pain, redness, or irritation at site where injected This list may not describe all possible side effects. Call your doctor for medical advice about side effects. You may report side effects to FDA at 1-800-FDA-1088. Where should I keep my medication? Keep out of the reach of children. You will be instructed on how to store this medicine. Throw away any unused medicine after the expiration date on the label. NOTE: This sheet is a summary. It may not cover all possible information. If you have questions about this medicine, talk to your doctor, pharmacist, or health care provider.  2022 [employer]/Gold Standard (2019-04-08 15:43:58)   Meds ordered this encounter  Medications   ondansetron (ZOFRAN-ODT) 4 MG disintegrating tablet    Sig: Take 1-2 tablets (4-8 mg total) by mouth every 8 (eight) hours as needed.    Dispense:  30 tablet    Refill:  3   tiZANidine (ZANAFLEX) 4 MG tablet    Sig: Take 1 tablet (4 mg total) by mouth every 6 (six) hours as needed for muscle spasms. Or headaches.    Dispense:  30 tablet    Refill:  6   metoCLOPramide (REGLAN) 10 MG tablet    Sig: Take 1 tablet (10 mg total) by mouth 3 (three) times daily as needed. For nausea or migraine.    Dispense:  10 tablet    Refill:  5   promethazine (PHENERGAN) 12.5 MG tablet    Sig: Take 1 tablet (12.5 mg total) by mouth every 6 (six) hours as needed for nausea or vomiting.    Dispense:  30 tablet    Refill:  6   Rimegepant Sulfate (NURTEC) 75 MG TBDP    Sig: Take 75 mg by mouth daily as needed. For migraines. Take as close to onset of migraine as possible. One daily maximum.    Dispense:  16 tablet    Refill:  11    Patient has copay card; she can have medication for $5 regardless of insurance approval or copay  amount.   methylPREDNISolone (MEDROL DOSEPAK) 4 MG TBPK tablet    Sig: Take pills all together with food for 6 days 6-5-4-3-2-1    Dispense:  21 tablet    Refill:  1   Erenumab-aooe (AIMOVIG) 140 MG/ML SOAJ    Sig: Inject 140 mg into the skin every 30 (thirty) days.    Dispense:  1.12 mL    Refill:  11    Patient has copay card; she can have medication for $5 regardless of insurance approval or copay amount.   acetaZOLAMIDE (DIAMOX) 250 MG tablet    Sig: Take 1 tablet (250 mg total) by mouth 2 (two) times daily.    Dispense:  60 tablet    Refill:  6   Ubrogepant (UBRELVY) 100 MG TABS  Sig: Take 100 mg by mouth every 2 (two) hours as needed.    Dispense:  2 tablet    Refill:  0

## 2021-08-03 NOTE — Progress Notes (Signed)
[facility]    Provider:  Dr Lucia Gaskins Referring Provider: Iona Hansen, NP Primary Care Physician:  Iona Hansen, NP  CC:  Chronic migraines  08/03/2021: Here for headache, went to ED, MRI negative, headache with left-sided weakness and numbness. Still having headache and numbness on her face. After the LP she got relief for 2.5 weeks, she has been battlingheadaches on and off since then, she woke up Tuesday with pressure-filled headache, Thursday her head felt numb, MRI was negative. We changed her to qulipta. Bennie Pierini is not helping. She feels pressure in her head se can feel it at the base of her neck. Stop qulipta and go back to Aimovig for migraines.   MRI 07/30/2021: IMPRESSION:reviewed images and agree Stable normal brain MRI. No acute intracranial abnormality. No significant cerebral white matter changes to suggest demyelinating disease.   Interval history 05/19/2021: Initially seen in 2019 and has followed up in our office 3 times since with NP Amy Lomax(see Below).  She has done well on Aimovig over the years and more recently is also on Nurtec due to failure of multiple acute management medications including triptans and others.   In April, she was out of town, she felt severe pressure in her head, never had that before, continued through Sunday she went to the ED and received medications and was able to drive home. The doctor felt a lymph node on 5/3 her pcp and was placed on doxycycline for 7 days, toradol shot, she went to the ED and waited for 7 hours, MRI of the brain was essentially normal, on 5/14 she had an infusion in our office and went to the ED a week later. She tried reglan, nurtec, tizanidine, cannot take triptans as she has side effects. She was doing great on Aimovig but now feels effectiveness wearing off. 4-5 migraine days a month. She also saw ophthalmology. More presssure around the head. Ear ache, episodes of blurry vision.   MRI brain  04/16/2021: IMPRESSION: Essentially normal brain MRI. No acute intracranial abnormality identified. Reviewed images and agree.  From review of chart:Medications tried that can be used in migraine management include: Aimovig, Tylenol, tizanidine, topiramate, baclofen, Fioricet, dexamethasone, Benadryl, Lexapro, Prozac, Nurtec, ibuprofen, BC powders, ketorolac injections and tablets, magnesium, Mobic, Depo-Medrol injections, Reglan oral and injections, Zofran ODT tablets and injections, Paxil, prednisone tablets, Compazine injections, Phenergan suppositories and tablets, propranolol, Maxalt, sumatriptan, Zoloft, amitriptyline, maxalt, triptans contraindicated  HISTORY (copied from Amy Lomax note on 01/08/2020)  CHERLYN SYRING is a 25 y.o. female here today for follow up for migraines. We restarted Amovig in 01/2020. She called in 02/2020 with continued migraines. We started topiramate 25mg  BID. Rizatriptan was not effective and switched to Nurtec. She was doing well until last month when she was diagnosed with Covid. Since, she has had constant headaches. She has had to take more OTC medications to treat migraines. Nurtec does still help. Reglan helps with nausea. She takes 1/2 tablet of tizanidine from time to time that helps.    HISTORY (copied from Amy Lomax note on 01/08/2020)  ANDRINA LOCKEN is a 25 y.o. female here today for follow up for migraines. She was doing well on Amovig but lost her insurance in November. Last injection was in October. Since, headaches have returned. She was seen in the ER recently due to intractable migraine. She does have white flashing aura before migraine. She is not on birth control as she is s/p hysterectomy. Rizatriptan, tizanidine and Reglan help  to abort headache. She rarely uses these medications when taking Amovig regularly.  She does have seasonal allergies and vertigo symptoms from time to time. Xyzal helps.    HISTORY: (copied from my Amy Lomax  on 02/11/2019)    VERMELL MADRID is a 25 y.o. female here today for follow up on migraines. She was doing well on Amovig and unfortunately had a lapse in insurance causing her to be without medication for about 3 months. She just recently got Amovig approved again. She has had more frequent and severe migraines. Unusally unilateral with radiation to the back of her head. She is very nauseous and sensitive to light and sounds. She was recently given metoclopramide that has helped some.  She complains of a pressure sensation behind the left eye that radiates to the back of her head. She has throbbing with nausea and vomiting. Light and sound make it worse. Sleep makes it better. She is currently being treated for a sinus infection. She has been on a total of three antibiotics. She does not feel that the antibiotics are helping. She is taking metoclopramide occasionally. Last dose was last night. She has not taken Maxalt in about 2 weeks. She took her last dose of Zofran last night.   HPI: 08/08/2018  MARTIE MUHLBAUER is a 25 y.o. female here as requested by Dr. Yetta Barre for chronic migraines.  Past medical history of migraine, depression, anxiety. Migraines since a child. No Fhx of migraies that she knows of. Migraines really started in HS. Seen by Neuro Dr. Sherryll Burger and here Dr. Neale Burly in [place] and tried nerve blocks, topamax, trigger point injections, sphenopalatine ganglion block. Triptans made it worse. Also tried Amitriptyline. She had an amazing response to Aimovig will reorder.  Daily headaches with superimposed migraines 15 migraine days a month, severe, worsening, last 1-2 days, pounding/pulsating/throbbing, +light sensitivity/ noise sensitivity, nausea has vomited movement akes it worse. She has black and white spots of aura.    Reviewed notes, labs and imaging from outside physicians, which showed:  Reviewed notes from [facility].  Patient was first seen in August of this year as a new patient.  Patient reported  migraine with aura estimated to have 2-3 headaches per week she was followed by [facility] but is coming to [place] because of health insurance coverage she is not currently on any migraine Rx.  She also has chronic anxiety and depression.  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  Exam was unremarkable.  Reviewed emergency room notes from August 03, 2018 for progressively worsening left-sided headache the same as previous migraines with associated nausea vomiting photophobia and phonophobia previously followed by a neurologist in [place].  Not on any preventative or abortive therapies.  She is been going to urgent care for IM Toradol and Phenergan when her headaches get bad.  She also fell down the stairs 1 week prior and hit her head with intermittent blurred vision and an increase in her migraine since the falls.  Reviewed CT scan report 08/03/2018: IMPRESSION: Limited evaluation of the brain due to severe motion artifact and nondiagnostic study of the lower portion of the brain and base of the skull. No acute intracranial pathology identified in the upper portion of the brain. If there is high clinical concern for acute intracranial pathology, repeat CT is recommended.  Review of Systems: Patient complains of symptoms per HPI as well as the following symptoms: pressure headache. Pertinent negatives and positives per HPI. All others  negative    Social History   Socioeconomic History   Marital status: Married    Spouse name: Will   Number of children: 2   Years of education: Not on file   Highest education level: Some college, no degree  Occupational History   Occupation: Furniture conservator/restorer: [employer]  Tobacco Use   Smoking status: Never   Smokeless tobacco: Never  Vaping Use   Vaping Use: Never used  Substance and Sexual Activity   Alcohol use: Not Currently    Alcohol/week: 0.0 standard drinks    Comment: occassionally   Drug use: No   Sexual activity: Yes     Partners: Male    Birth control/protection: Surgical  Other Topics Concern   Not on file  Social History Narrative   Lives at home with her husband and children   Right handed   Caffeine 1 cup daily   Social Determinants of Health   Financial Resource Strain: Not on file  Food Insecurity: Not on file  Transportation Needs: Not on file  Physical Activity: Not on file  Stress: Not on file  Social Connections: Not on file  Intimate Partner Violence: Not on file    Family History  Problem Relation Age of Onset   Hypertension Mother    Heart disease Mother        MI   Anesthesia problems Mother        post-op N/V   Depression Mother    Anxiety disorder Mother    Obesity Mother    Congestive Heart Failure Mother    Heart disease Father    Bipolar disorder Father    Hyperlipidemia Father    Asthma Sister    Bipolar disorder Sister    Asthma Brother    Diabetes Maternal Aunt    Uterine cancer Maternal Grandmother    Pancreatic cancer Maternal Grandmother    Lung cancer Maternal Grandfather    Migraines Neg Hx     Past Medical History:  Diagnosis Date   Anxiety    Complication of anesthesia    wakes up crying   Depression    Endometriosis    Family history of adverse reaction to anesthesia    hard to wake up, low heart rate   Gestational hypertension 01/2012   also had postpartum preelampsia   History of stomach ulcers    Hx of ovarian cyst    x 2-3   Hx of varicella    IBS (irritable bowel syndrome)    Migraine    chronic   Panic attack    Pinched nerve    left arm   Plica of knee 08/2012   medial plica left knee    Past Surgical History:  Procedure Laterality Date   KNEE ARTHROSCOPY  08/17/2012   Procedure: ARTHROSCOPY KNEE;  Surgeon: Harvie Junior, MD;  Location: [facility];  Service: Orthopedics;  Laterality: Left;  left knee scope with tight lateral retinacular release and plica removal   TYMPANOSTOMY TUBE PLACEMENT Bilateral 1998    VAGINAL HYSTERECTOMY Bilateral 08/14/2018   Procedure: HYSTERECTOMY VAGINAL WITH SALPINGECTOMY;  Surgeon: Reva Bores, MD;  Location: [facility];  Service: Gynecology;  Laterality: Bilateral;    Current Outpatient Medications  Medication Sig Dispense Refill   acetaZOLAMIDE (DIAMOX) 250 MG tablet Take 1 tablet (250 mg total) by mouth 2 (two) times daily. 60 tablet 6   ALPRAZolam (XANAX) 0.5 MG tablet Take 1 tablet (0.5 mg total)  by mouth daily as needed for anxiety. 20 tablet 0   Erenumab-aooe (AIMOVIG) 140 MG/ML SOAJ Inject 140 mg into the skin every 30 (thirty) days. 1.12 mL 11   methylPREDNISolone (MEDROL DOSEPAK) 4 MG TBPK tablet Take pills all together with food for 6 days 6-5-4-3-2-1 21 tablet 1   promethazine (PHENERGAN) 12.5 MG tablet Take 1 tablet (12.5 mg total) by mouth every 6 (six) hours as needed for nausea or vomiting. 30 tablet 6   Rimegepant Sulfate (NURTEC) 75 MG TBDP Take 75 mg by mouth daily as needed. For migraines. Take as close to onset of migraine as possible. One daily maximum. 16 tablet 11   Ubrogepant (UBRELVY) 100 MG TABS Take 100 mg by mouth every 2 (two) hours as needed. 2 tablet 0   metoCLOPramide (REGLAN) 10 MG tablet Take 1 tablet (10 mg total) by mouth 3 (three) times daily as needed. For nausea or migraine. 10 tablet 5   ondansetron (ZOFRAN-ODT) 4 MG disintegrating tablet Take 1-2 tablets (4-8 mg total) by mouth every 8 (eight) hours as needed. 30 tablet 3   tiZANidine (ZANAFLEX) 4 MG tablet Take 1 tablet (4 mg total) by mouth every 6 (six) hours as needed for muscle spasms. Or headaches. 30 tablet 6   No current facility-administered medications for this visit.    Allergies as of 08/03/2021 - Review Complete 08/03/2021  Allergen Reaction Noted   Percocet [oxycodone-acetaminophen] Hives and Itching 08/01/2018   Cefuroxime axetil Rash 09/04/2011   Penicillins Rash 07/17/2018    Vitals: BP 113/80   Pulse (!) 121   Ht 5\' 4"  (1.626 m)   Wt 207 lb 9.6 oz  (94.2 kg)   LMP 07/15/2018 (Exact Date)   BMI 35.63 kg/m  Last Weight:  Wt Readings from Last 1 Encounters:  08/03/21 207 lb 9.6 oz (94.2 kg)   Last Height:   Ht Readings from Last 1 Encounters:  08/03/21 5\' 4"  (1.626 m)  Exam: NAD, pleasant                  Speech:    Speech is normal; fluent and spontaneous with normal comprehension.  Cognition:    The patient is oriented to person, place, and time;     recent and remote memory intact;     language fluent;    Cranial Nerves:    The pupils are equal, round, and reactive to light.fundi margins sharp and flat; no papilledema. Trigeminal sensation is intact and the muscles of mastication are normal. The face is symmetric. The palate elevates in the midline. Hearing intact. Voice is normal. Shoulder shrug is normal. The tongue has normal motion without fasciculations.   Coordination:  No dysmetria  Motor Observation:    No asymmetry, no atrophy, and no involuntary movements noted. Tone:    Normal muscle tone.     Strength:    Strength is V/V in the upper and lower limbs.      Sensation: intact to LT      Assessment/Plan:  25 year old with episodic migraines with and without aura. Failed multipe acute and preventatives. She may have chronic migraines with a mild case of IDIOPATHIC INTRACRANIAL HYPERTENSION need to treat both.   New headache, pressure around the head, episodic vision changes, 25 years old and obese: LP to check opening pressure for IDIOPATHIC INTRACRANIAL HYPERTENSION. She felt better after the LP even though opening pressure was 21, being on Topamax may have confounded. She is still having pressure headaches on  topamax, change to diamox.  Chronic migraines: qulipta did not work, going back to [employer], gave 6 months of samples  Acutely: Nurtec works prescribed. Gave 2 samples of ubrelvy as well.   Ondansetron: during day for nausea  Tizanidine for muscle cramps, neck pain  Phenergan: can use for nausea if  drowsiness will not be a problem dont take together with zofran or raglan  Discussed again: There is increased risk for stroke in women with migraine with aura and a  Contraindication for the combined contraceptive pill for use by women who have migraine with aura, which is in line with World Health Organisation recommendations. The risk for women with migraine without aura is lower and other risk factors like smoking are far more likely to increase stroke risk than migraine. There is a recommendation for no smoking and for the use of low estrogen or progestogen only pills particularly for women with migraine with aura. It is important however that women with migraine who are taking the pill do not decide to suddenly stop taking it without discussing this with their doctor. Please discuss with her OB/GYN.  Discussed: To prevent or relieve headaches, try the following: Cool Compress. Lie down and place a cool compress on your head.  Avoid headache triggers. If certain foods or odors seem to have triggered your migraines in the past, avoid them. A headache diary might help you identify triggers.  Include physical activity in your daily routine. Try a daily walk or other moderate aerobic exercise.  Manage stress. Find healthy ways to cope with the stressors, such as delegating tasks on your to-do list.  Practice relaxation techniques. Try deep breathing, yoga, massage and visualization.  Eat regularly. Eating regularly scheduled meals and maintaining a healthy diet might help prevent headaches. Also, drink plenty of fluids.  Follow a regular sleep schedule. Sleep deprivation might contribute to headaches Consider biofeedback. With this mind-body technique, you learn to control certain bodily functions -- such as muscle tension, heart rate and blood pressure -- to prevent headaches or reduce headache pain.    Proceed to emergency room if you experience new or worsening symptoms or symptoms do not resolve,  if you have new neurologic symptoms or if headache is severe, or for any concerning symptom.   Provided education and documentation from American headache Society toolbox including articles on: chronic migraine medication overuse headache, chronic migraines, prevention of migraines, behavioral and other nonpharmacologic treatments for headache.  Cc: Jenne Pane, MD  [facility] [address] [place], [place] [address]  Phone [phone] Fax [phone] I spent over 40 minutes of face-to-face and non-face-to-face time with patient on the  1. Chronic migraine without aura, with intractable migraine, so stated, with status migrainosus   2. Migraine with aura and without status migrainosus, not intractable    diagnosis.  This included previsit chart review, lab review, study review, order entry, electronic health record documentation, patient education on the different diagnostic and therapeutic options, counseling and coordination of care, risks and benefits of management, compliance, or risk factor reduction

## 2021-08-03 NOTE — Telephone Encounter (Signed)
PA for Aimovig 140mg /ml submitted on CMM, Key: .   Awaiting determination from [employer].   From review of chart:Medications tried that can be used in migraine management include: Aimovig, Tylenol, tizanidine, topiramate, baclofen, Fioricet, dexamethasone, Benadryl, Lexapro, Prozac, Nurtec, ibuprofen, BC powders, ketorolac injections and tablets, magnesium, Mobic, Depo-Medrol injections, Reglan oral and injections, Zofran ODT tablets and injections, Paxil, prednisone tablets, Compazine injections, Phenergan suppositories and tablets, propranolol, Maxalt, sumatriptan, Zoloft, amitriptyline, maxalt, triptans contraindicated.

## 2021-08-03 NOTE — Progress Notes (Signed)
Gave 60mg  toradol shot in right buttocks.  Put Bandaid  on patient

## 2021-08-10 NOTE — Telephone Encounter (Signed)
Approved. Effective from 08/03/2021 through 08/02/2022.

## 2021-09-22 ENCOUNTER — Ambulatory Visit: Payer: BLUE CROSS/BLUE SHIELD | Admitting: Family Medicine

## 2021-11-01 ENCOUNTER — Other Ambulatory Visit (HOSPITAL_COMMUNITY): Payer: Self-pay

## 2021-11-01 MED ORDER — EMGALITY 120 MG/ML ~~LOC~~ SOAJ
SUBCUTANEOUS | 0 refills | Status: DC
  Filled 2021-11-01: qty 2, 30d supply, fill #0

## 2021-11-02 ENCOUNTER — Other Ambulatory Visit (HOSPITAL_COMMUNITY): Payer: Self-pay

## 2021-11-25 ENCOUNTER — Telehealth: Payer: Self-pay

## 2021-11-25 NOTE — Telephone Encounter (Signed)
I submitted a PA request for Nurtec #16 on CMM, Key: [id number]. Awaiting determination.

## 2021-11-30 NOTE — Telephone Encounter (Signed)
Approved, Effective from 11/25/2021 through 11/24/2022

## 2021-12-03 ENCOUNTER — Other Ambulatory Visit (HOSPITAL_COMMUNITY): Payer: Self-pay

## 2021-12-03 MED ORDER — EMGALITY 120 MG/ML ~~LOC~~ SOAJ: SUBCUTANEOUS | 0 refills | Status: DC

## 2021-12-07 ENCOUNTER — Other Ambulatory Visit (HOSPITAL_COMMUNITY): Payer: Self-pay

## 2021-12-09 ENCOUNTER — Other Ambulatory Visit (HOSPITAL_COMMUNITY): Payer: Self-pay

## 2022-08-29 IMAGING — DX DG CHEST 2V
2 series · 2 of 2 positions shown · non-contrast
Comparison: May 26, 2020

CLINICAL DATA: Cough

EXAM:
CHEST - 2 VIEW

[chest pa]
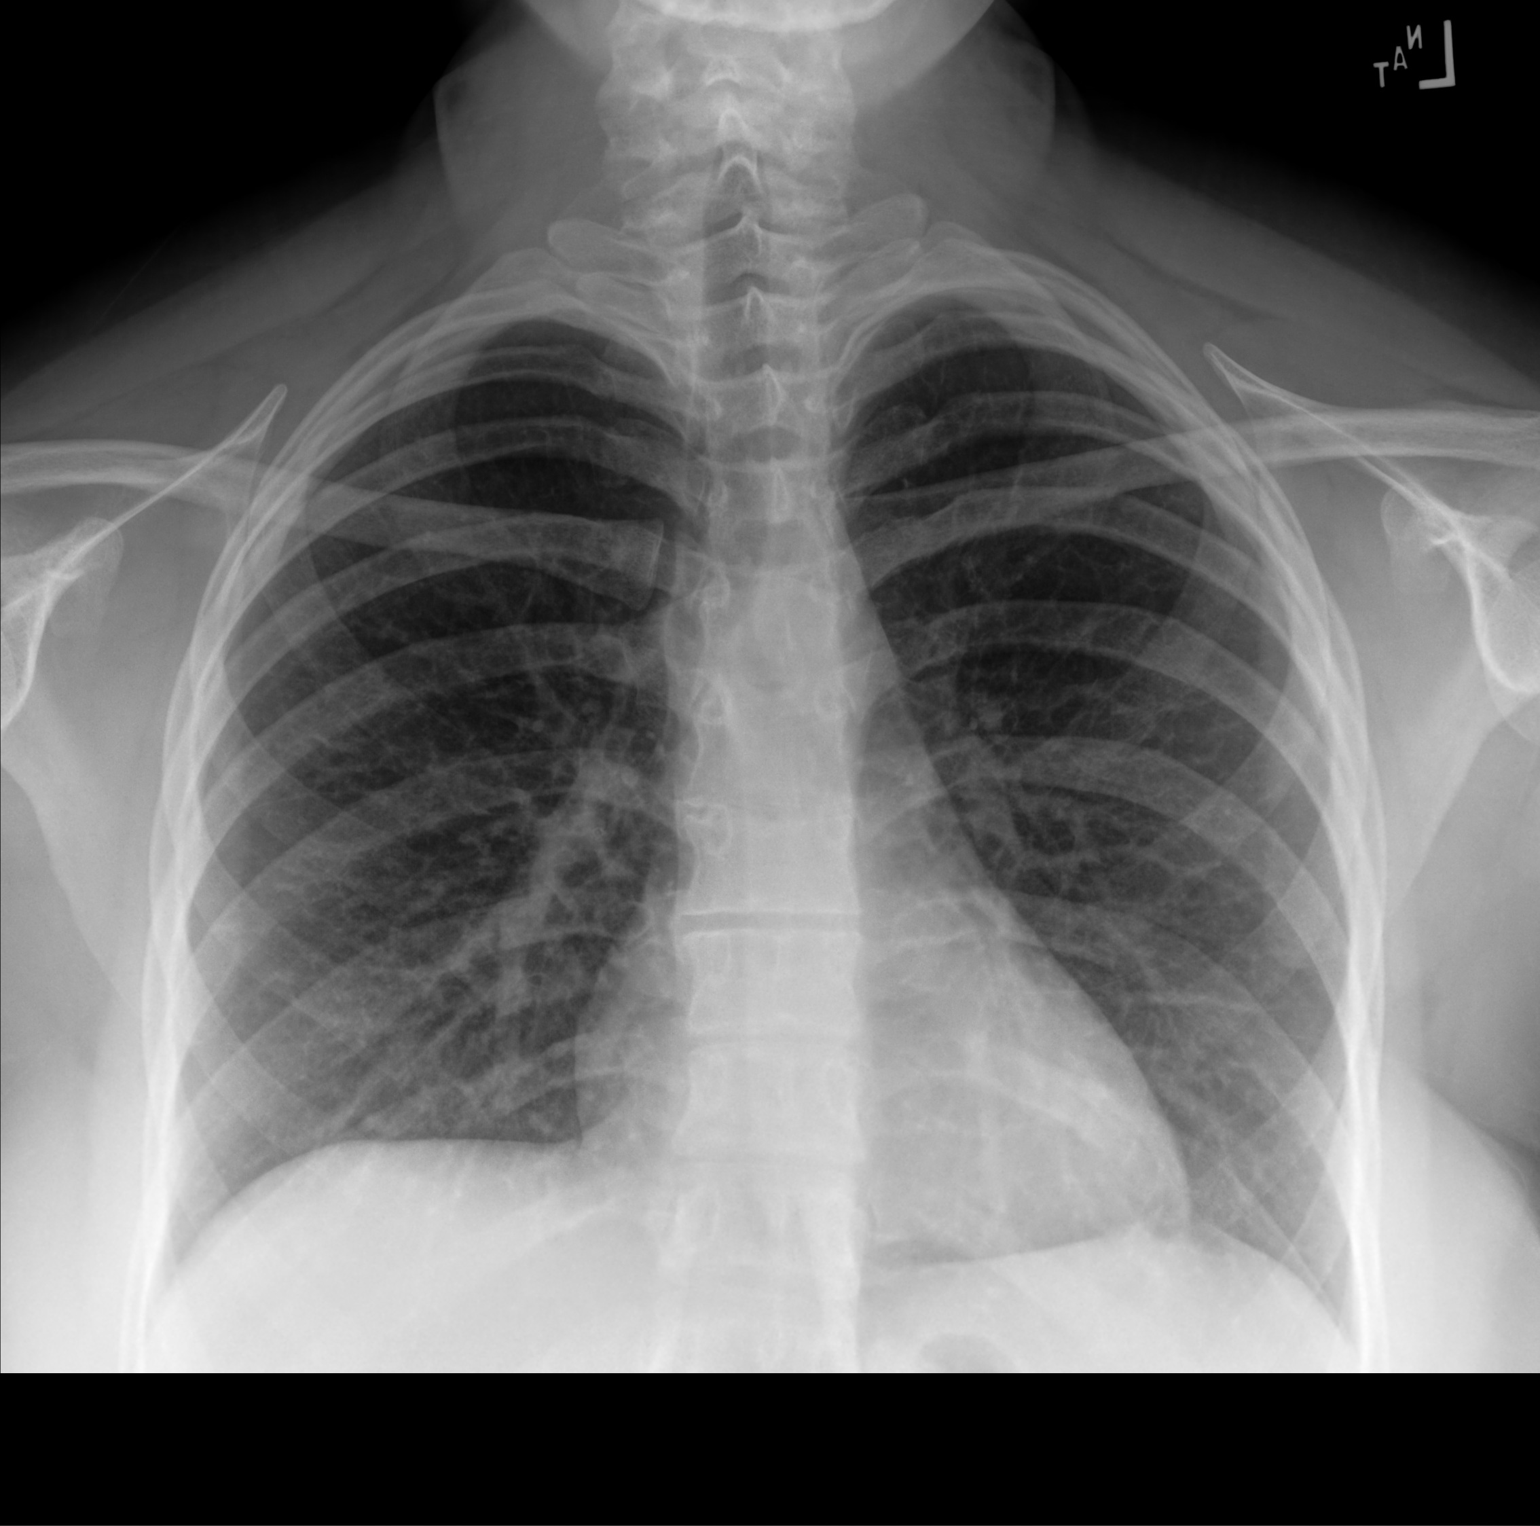

[chest lat]
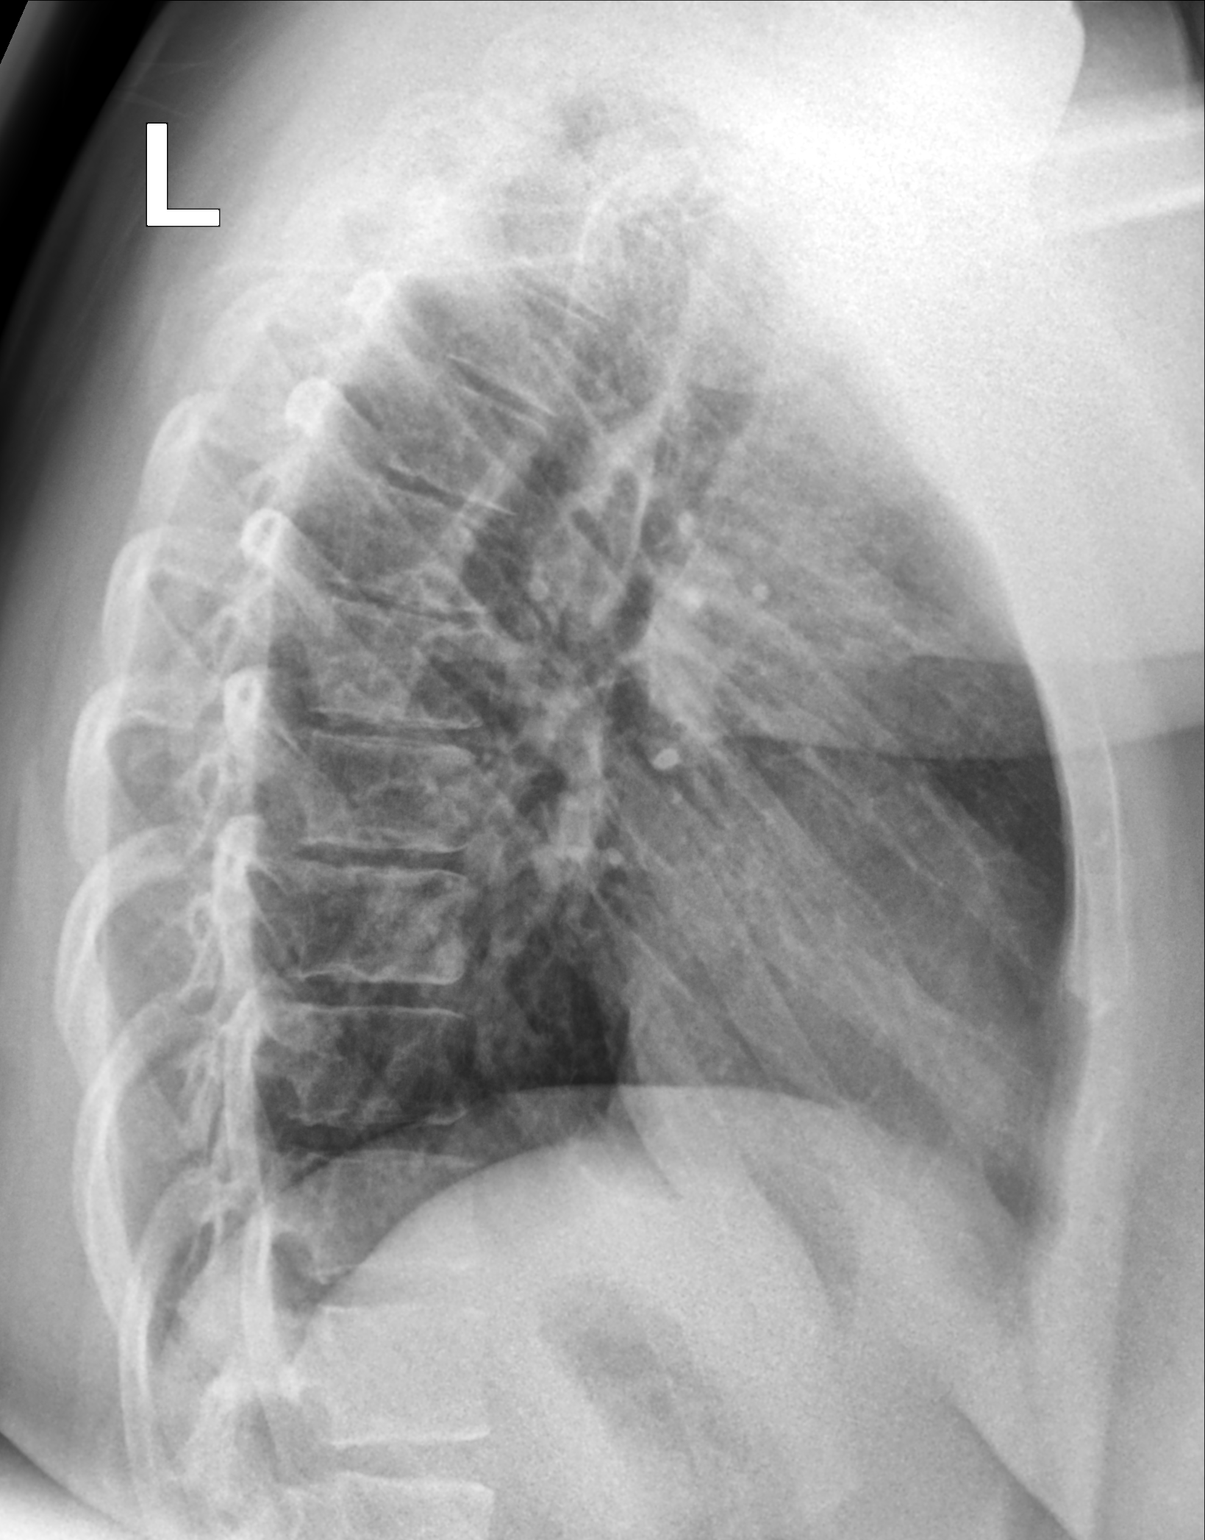

[2 of 2 positions shown; findings below may reference images not displayed]

FINDINGS: The cardiomediastinal silhouette is normal in contour. No pleural
effusion. No pneumothorax. No acute pleuroparenchymal abnormality.
Visualized abdomen is unremarkable. No acute osseous abnormality
noted.
IMPRESSION: No acute cardiopulmonary abnormality.

## 2023-05-31 ENCOUNTER — Other Ambulatory Visit: Payer: Self-pay

## 2024-01-07 ENCOUNTER — Encounter (HOSPITAL_BASED_OUTPATIENT_CLINIC_OR_DEPARTMENT_OTHER): Payer: Self-pay | Admitting: Emergency Medicine

## 2024-01-07 ENCOUNTER — Other Ambulatory Visit: Payer: Self-pay

## 2024-01-07 ENCOUNTER — Emergency Department (HOSPITAL_BASED_OUTPATIENT_CLINIC_OR_DEPARTMENT_OTHER): Payer: BLUE CROSS/BLUE SHIELD

## 2024-01-07 ENCOUNTER — Emergency Department (HOSPITAL_BASED_OUTPATIENT_CLINIC_OR_DEPARTMENT_OTHER)
Admission: EM | Admit: 2024-01-07 | Discharge: 2024-01-07 | Disposition: A | Discharge status: Home / Self Care | Payer: BLUE CROSS/BLUE SHIELD | Attending: Emergency Medicine | Admitting: Emergency Medicine

## 2024-01-07 DIAGNOSIS — Y92410 Unspecified street and highway as the place of occurrence of the external cause: Secondary | ICD-10-CM | POA: Diagnosis not present

## 2024-01-07 DIAGNOSIS — S060X9A Concussion with loss of consciousness of unspecified duration, initial encounter: Secondary | ICD-10-CM | POA: Insufficient documentation

## 2024-01-07 DIAGNOSIS — S0990XA Unspecified injury of head, initial encounter: Secondary | ICD-10-CM | POA: Diagnosis present

## 2024-01-07 MED ORDER — KETOROLAC TROMETHAMINE 15 MG/ML IJ SOLN: 15.0000 mg | Freq: Once | INTRAMUSCULAR | Status: DC

## 2024-01-07 MED ORDER — KETOROLAC TROMETHAMINE 15 MG/ML IJ SOLN
15.0000 mg | Freq: Once | INTRAMUSCULAR | Status: AC
  Administered 2024-01-07: 15 mg via INTRAMUSCULAR
  Filled 2024-01-07: qty 1

## 2024-01-07 MED ORDER — DIPHENHYDRAMINE HCL 50 MG/ML IJ SOLN: 50.0000 mg | Freq: Once | INTRAMUSCULAR | Status: DC

## 2024-01-07 MED ORDER — METOCLOPRAMIDE HCL 5 MG/ML IJ SOLN: 10.0000 mg | Freq: Once | INTRAMUSCULAR | Status: DC
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
## 2024-01-07 NOTE — Discharge Instructions (Signed)
You have been evaluated for your symptoms.  Your symptom is consistent with a concussion.  Fortunately CT scan of the head and cervical spine did not show any broken bone or blood in your brain.  Please get plenty of rest, continue with your migraine medication, and follow-up with your doctor for further care.

## 2024-03-04 ENCOUNTER — Other Ambulatory Visit: Payer: Self-pay

## 2024-03-04 ENCOUNTER — Emergency Department (HOSPITAL_BASED_OUTPATIENT_CLINIC_OR_DEPARTMENT_OTHER)
Admission: EM | Admit: 2024-03-04 | Discharge: 2024-03-04 | Disposition: A | Discharge status: Home / Self Care | Attending: Emergency Medicine | Admitting: Emergency Medicine

## 2024-03-04 ENCOUNTER — Encounter (HOSPITAL_BASED_OUTPATIENT_CLINIC_OR_DEPARTMENT_OTHER): Payer: Self-pay | Admitting: Emergency Medicine

## 2024-03-04 DIAGNOSIS — R000 Tachycardia, unspecified: Secondary | ICD-10-CM | POA: Insufficient documentation

## 2024-03-04 DIAGNOSIS — R519 Headache, unspecified: Secondary | ICD-10-CM | POA: Insufficient documentation

## 2024-03-04 DIAGNOSIS — M542 Cervicalgia: Secondary | ICD-10-CM | POA: Diagnosis not present

## 2024-03-04 DIAGNOSIS — R110 Nausea: Secondary | ICD-10-CM | POA: Diagnosis not present

## 2024-03-04 MED ORDER — KETOROLAC TROMETHAMINE 15 MG/ML IJ SOLN
15.0000 mg | Freq: Once | INTRAMUSCULAR | Status: AC
  Administered 2024-03-04: 15 mg via INTRAVENOUS
  Filled 2024-03-04: qty 1

## 2024-03-04 MED ORDER — MAGNESIUM SULFATE 2 GM/50ML IV SOLN
2.0000 g | Freq: Once | INTRAVENOUS | Status: AC
  Administered 2024-03-04: 2 g via INTRAVENOUS
  Filled 2024-03-04: qty 50

## 2024-03-04 MED ORDER — METOCLOPRAMIDE HCL 5 MG/ML IJ SOLN
10.0000 mg | Freq: Once | INTRAMUSCULAR | Status: AC
  Administered 2024-03-04: 10 mg via INTRAVENOUS
  Filled 2024-03-04: qty 2

## 2024-03-04 MED ORDER — DEXAMETHASONE SODIUM PHOSPHATE 10 MG/ML IJ SOLN
10.0000 mg | Freq: Once | INTRAMUSCULAR | Status: AC
  Administered 2024-03-04: 10 mg via INTRAVENOUS
  Filled 2024-03-04: qty 1

## 2024-03-04 MED ORDER — SODIUM CHLORIDE 0.9 % IV BOLUS
1000.0000 mL | Freq: Once | INTRAVENOUS | Status: AC
  Administered 2024-03-04: 1000 mL via INTRAVENOUS

## 2024-03-04 MED ORDER — DIPHENHYDRAMINE HCL 50 MG/ML IJ SOLN
50.0000 mg | Freq: Once | INTRAMUSCULAR | Status: AC
  Administered 2024-03-04: 50 mg via INTRAVENOUS
  Filled 2024-03-04: qty 1

## 2024-03-04 NOTE — ED Provider Notes (Signed)
 [place] EMERGENCY DEPARTMENT AT [facility] Provider Note   CSN: [id number] Arrival date & time: 03/04/24  1541     History  Chief Complaint  Patient presents with   Migraine    [name] Sanchez is a 28 y.o. female.  The history is provided by the patient and medical records. No language interpreter was used.  Migraine     28 year old female extensive history of migraine, seasonal affective disorder, anxiety presenting with complaint of headache.  Patient report ongoing migraine headache for the past week.  Described as a throbbing pressure sensation to the left side of her head and behind her left eye with associate light and sound sensitivity, along with nausea.  Headache felt similar to prior headaches except more intense and not adequately improved despite using her home headache regimen.  No associated fever or chills no rash no focal numbness or focal weakness.  No recent trauma.  She normally have headache at least 20 days/month.  On occasion she needs lumbar puncture and DHE infusion for her headache.  She attributed her headache to seasonal change.  Home Medications Prior to Admission medications   Medication Sig Start Date End Date Taking? Authorizing Provider  acetaZOLAMIDE (DIAMOX) 250 MG tablet Take 1 tablet (250 mg total) by mouth 2 (two) times daily. 08/03/21   Anson Fret, MD  ALPRAZolam Prudy Feeler) 0.5 MG tablet Take 1 tablet (0.5 mg total) by mouth daily as needed for anxiety. 11/12/18   Danford, Jinny Blossom, NP  Erenumab-aooe (AIMOVIG) 140 MG/ML SOAJ Inject 140 mg into the skin every 30 (thirty) days. 08/03/21   Anson Fret, MD  Galcanezumab-gnlm [facility]) 120 MG/ML SOAJ First month loading dose: Inject the contents of 2 pens (2 mls total) into the skin once for 1 dose 12/03/21     methylPREDNISolone (MEDROL DOSEPAK) 4 MG TBPK tablet Take pills all together with food for 6 days 6-5-4-3-2-1 08/03/21   Anson Fret, MD  metoCLOPramide (REGLAN) 10 MG  tablet Take 1 tablet (10 mg total) by mouth 3 (three) times daily as needed. For nausea or migraine. 08/03/21   Anson Fret, MD  ondansetron (ZOFRAN-ODT) 4 MG disintegrating tablet Take 1-2 tablets (4-8 mg total) by mouth every 8 (eight) hours as needed. 08/03/21   Anson Fret, MD  promethazine (PHENERGAN) 12.5 MG tablet Take 1 tablet (12.5 mg total) by mouth every 6 (six) hours as needed for nausea or vomiting. 08/03/21   Anson Fret, MD  Rimegepant Sulfate (NURTEC) 75 MG TBDP Take 75 mg by mouth daily as needed. For migraines. Take as close to onset of migraine as possible. One daily maximum. 08/03/21   Anson Fret, MD  tiZANidine (ZANAFLEX) 4 MG tablet Take 1 tablet (4 mg total) by mouth every 6 (six) hours as needed for muscle spasms. Or headaches. 08/03/21   Anson Fret, MD  Ubrogepant (UBRELVY) 100 MG TABS Take 100 mg by mouth every 2 (two) hours as needed. 08/03/21   Anson Fret, MD      Allergies    Compazine [prochlorperazine], Percocet [oxycodone-acetaminophen], Cefuroxime axetil, and Penicillins    Review of Systems   Review of Systems  All other systems reviewed and are negative.   Physical Exam Updated Vital Signs BP 109/76 (BP Location: Right Arm)   Pulse (!) 141   Temp 98.3 F (36.8 C)   Resp 20   LMP 07/15/2018 (Exact Date)   SpO2 100%  Physical Exam Vitals and nursing  note reviewed.  Constitutional:      General: She is not in acute distress.    Appearance: She is well-developed.     Comments: Well-appearing female resting company bed appears to be in no acute discomfort.  She is wearing sunglasses.  HENT:     Head: Normocephalic and atraumatic.     Mouth/Throat:     Mouth: Mucous membranes are moist.  Eyes:     Extraocular Movements: Extraocular movements intact.     Conjunctiva/sclera: Conjunctivae normal.     Pupils: Pupils are equal, round, and reactive to light.  Neck:     Comments: Neck with full range of motion.  No nuchal  rigidity Cardiovascular:     Rate and Rhythm: Tachycardia present.     Pulses: Normal pulses.     Heart sounds: Normal heart sounds.  Pulmonary:     Effort: Pulmonary effort is normal.  Abdominal:     Palpations: Abdomen is soft.  Musculoskeletal:        General: Normal range of motion.     Cervical back: Normal range of motion and neck supple. Tenderness (Mild tenderness to left cervical paraspinal muscle without any overlying skin changes.) present. No rigidity.     Comments: 5 out of 5 strength all 4 extremities  Skin:    Findings: No rash.  Neurological:     Mental Status: She is alert.     GCS: GCS eye subscore is 4. GCS verbal subscore is 5. GCS motor subscore is 6.     Sensory: Sensation is intact.     Motor: Motor function is intact.  Psychiatric:        Mood and Affect: Mood normal.     ED Results / Procedures / Treatments   Labs (all labs ordered are listed, but only abnormal results are displayed) Labs Reviewed - No data to display  EKG None  Radiology No results found.  Procedures Procedures    Medications Ordered in ED Medications - No data to display  ED Course/ Medical Decision Making/ A&P                                 Medical Decision Making Risk Prescription drug management.   BP 109/76 (BP Location: Right Arm)   Pulse (!) 141   Temp 98.3 F (36.8 C)   Resp 20   LMP 07/15/2018 (Exact Date)   SpO2 100%   61:53 PM  28 year old female extensive history of migraine, seasonal affective disorder, anxiety presenting with complaint of headache.  Patient report ongoing migraine headache for the past week.  Described as a throbbing pressure sensation to the left side of her head and behind her left eye with associate light and sound sensitivity, along with nausea.  Headache felt similar to prior headaches except more intense and not adequately improved despite using her home headache regimen.  No associated fever or chills no rash no focal numbness  or focal weakness.  No recent trauma.  She normally have headache at least 20 days/month.  On occasion she needs lumbar puncture and DHE infusion for her headache.  She attributed her headache to seasonal change.  Exam overall reassuring mild tenderness noted to the left cervical paraspinal muscle on palpation without any overlying skin changes.  Patient has equal strength throughout she is mentating appropriately and she is without any focal neurodeficit.  She is however tachycardic.  Given her extensive history  of recurrent migraine headache, will give migraine cocktail as well as IV fluid and will monitor closely.  Doubt infectious etiology.  Doubt acute emergent headache such as subarachnoid hemorrhage, pseudotumor cerebri, meningitis  8:08 PM Patient was giving a migraine cocktail which includes Reglan, Toradol, Decadron, Benadryl, and magnesium along with IV fluid.  Patient was monitored closely in the ER and at this time she felt much better and felt comfortable going home.  She will follow-up closely with neurologist for outpatient managements of her recurrent headache.  I gave patient return precaution.  Hospital admission considered but with improvement of symptoms she is stable for discharge.  I have also considered labs and imaging such as head CT scan and brain MRI but felt it is low yield.  I encourage patient to continue taking her home medication for headache.        Final Clinical Impression(s) / ED Diagnoses Final diagnoses:  Bad headache    Rx / DC Orders ED Discharge Orders     None         Fayrene Helper, PA-C 03/04/24 2010    Melene Plan, DO 03/04/24 2203

## 2024-03-04 NOTE — ED Notes (Signed)
 Pt/family requested to be discharged... Pt/family informed we were waiting on the PA to talk to them again... Provider informed.Marland KitchenMarland Kitchen

## 2024-03-04 NOTE — Discharge Instructions (Signed)
 Please call and follow-up closely with your neurologist for further evaluation and managements of your headache.  Return if you have any concern.

## 2024-03-04 NOTE — ED Notes (Signed)
 Discharge paperwork given and verbally understood.

## 2024-03-04 NOTE — ED Triage Notes (Signed)
 Migraine x 1 week Left side No relief with home medication Hx migraines

## 2024-07-26 ENCOUNTER — Emergency Department (HOSPITAL_BASED_OUTPATIENT_CLINIC_OR_DEPARTMENT_OTHER)

## 2024-07-26 ENCOUNTER — Inpatient Hospital Stay (HOSPITAL_BASED_OUTPATIENT_CLINIC_OR_DEPARTMENT_OTHER)
Admission: EM | Admit: 2024-07-26 | Discharge: 2024-07-30 | DRG: 389 | Disposition: A | Discharge status: Home / Self Care | Attending: Internal Medicine | Admitting: Internal Medicine

## 2024-07-26 ENCOUNTER — Encounter (HOSPITAL_BASED_OUTPATIENT_CLINIC_OR_DEPARTMENT_OTHER): Payer: Self-pay

## 2024-07-26 ENCOUNTER — Other Ambulatory Visit: Payer: Self-pay

## 2024-07-26 DIAGNOSIS — E8729 Other acidosis: Secondary | ICD-10-CM | POA: Diagnosis present

## 2024-07-26 DIAGNOSIS — Z8711 Personal history of peptic ulcer disease: Secondary | ICD-10-CM

## 2024-07-26 DIAGNOSIS — Z79899 Other long term (current) drug therapy: Secondary | ICD-10-CM

## 2024-07-26 DIAGNOSIS — R000 Tachycardia, unspecified: Secondary | ICD-10-CM | POA: Diagnosis not present

## 2024-07-26 DIAGNOSIS — Z888 Allergy status to other drugs, medicaments and biological substances status: Secondary | ICD-10-CM

## 2024-07-26 DIAGNOSIS — K5651 Intestinal adhesions [bands], with partial obstruction: Secondary | ICD-10-CM | POA: Diagnosis not present

## 2024-07-26 DIAGNOSIS — E878 Other disorders of electrolyte and fluid balance, not elsewhere classified: Secondary | ICD-10-CM | POA: Diagnosis present

## 2024-07-26 DIAGNOSIS — F39 Unspecified mood [affective] disorder: Secondary | ICD-10-CM | POA: Diagnosis present

## 2024-07-26 DIAGNOSIS — F418 Other specified anxiety disorders: Secondary | ICD-10-CM | POA: Diagnosis present

## 2024-07-26 DIAGNOSIS — E669 Obesity, unspecified: Secondary | ICD-10-CM | POA: Diagnosis present

## 2024-07-26 DIAGNOSIS — Z818 Family history of other mental and behavioral disorders: Secondary | ICD-10-CM

## 2024-07-26 DIAGNOSIS — Z885 Allergy status to narcotic agent status: Secondary | ICD-10-CM

## 2024-07-26 DIAGNOSIS — F410 Panic disorder [episodic paroxysmal anxiety] without agoraphobia: Secondary | ICD-10-CM | POA: Diagnosis present

## 2024-07-26 DIAGNOSIS — R1032 Left lower quadrant pain: Secondary | ICD-10-CM | POA: Diagnosis not present

## 2024-07-26 DIAGNOSIS — Z881 Allergy status to other antibiotic agents status: Secondary | ICD-10-CM

## 2024-07-26 DIAGNOSIS — Z90710 Acquired absence of both cervix and uterus: Secondary | ICD-10-CM | POA: Diagnosis present

## 2024-07-26 DIAGNOSIS — Z791 Long term (current) use of non-steroidal anti-inflammatories (NSAID): Secondary | ICD-10-CM

## 2024-07-26 DIAGNOSIS — K56609 Unspecified intestinal obstruction, unspecified as to partial versus complete obstruction: Secondary | ICD-10-CM | POA: Diagnosis not present

## 2024-07-26 DIAGNOSIS — Z880 Allergy status to penicillin: Secondary | ICD-10-CM

## 2024-07-26 DIAGNOSIS — G43009 Migraine without aura, not intractable, without status migrainosus: Secondary | ICD-10-CM | POA: Diagnosis present

## 2024-07-26 DIAGNOSIS — Z8249 Family history of ischemic heart disease and other diseases of the circulatory system: Secondary | ICD-10-CM

## 2024-07-26 DIAGNOSIS — F32A Depression, unspecified: Secondary | ICD-10-CM | POA: Diagnosis present

## 2024-07-26 DIAGNOSIS — G8929 Other chronic pain: Secondary | ICD-10-CM | POA: Diagnosis present

## 2024-07-26 DIAGNOSIS — Z833 Family history of diabetes mellitus: Secondary | ICD-10-CM

## 2024-07-26 DIAGNOSIS — M797 Fibromyalgia: Secondary | ICD-10-CM | POA: Diagnosis present

## 2024-07-26 DIAGNOSIS — F419 Anxiety disorder, unspecified: Secondary | ICD-10-CM | POA: Diagnosis present

## 2024-07-26 DIAGNOSIS — Z6839 Body mass index (BMI) 39.0-39.9, adult: Secondary | ICD-10-CM

## 2024-07-26 DIAGNOSIS — K581 Irritable bowel syndrome with constipation: Secondary | ICD-10-CM | POA: Diagnosis present

## 2024-07-26 LAB — URINALYSIS, ROUTINE W REFLEX MICROSCOPIC
Bilirubin Urine: NEGATIVE
Glucose, UA: NEGATIVE mg/dL
Hgb urine dipstick: NEGATIVE
Ketones, ur: NEGATIVE mg/dL
Leukocytes,Ua: NEGATIVE
Nitrite: NEGATIVE
Protein, ur: NEGATIVE mg/dL
Specific Gravity, Urine: 1.005 (ref 1.005–1.030)
pH: 7 (ref 5.0–8.0)

## 2024-07-26 LAB — CBC
HCT: 38.2 % (ref 36.0–46.0)
Hemoglobin: 12.8 g/dL (ref 12.0–15.0)
MCH: 31.2 pg (ref 26.0–34.0)
MCHC: 33.5 g/dL (ref 30.0–36.0)
MCV: 93.2 fL (ref 80.0–100.0)
Platelets: 251 K/uL (ref 150–400)
RBC: 4.1 MIL/uL (ref 3.87–5.11)
RDW: 12.4 % (ref 11.5–15.5)
WBC: 6.3 K/uL (ref 4.0–10.5)
nRBC: 0 % (ref 0.0–0.2)

## 2024-07-26 LAB — COMPREHENSIVE METABOLIC PANEL WITH GFR
ALT: 18 U/L (ref 0–44)
AST: 18 U/L (ref 15–41)
Albumin: 4.4 g/dL (ref 3.5–5.0)
Alkaline Phosphatase: 64 U/L (ref 38–126)
Anion gap: 13 (ref 5–15)
BUN: 8 mg/dL (ref 6–20)
CO2: 20 mmol/L — ABNORMAL LOW (ref 22–32)
Calcium: 9.7 mg/dL (ref 8.9–10.3)
Chloride: 109 mmol/L (ref 98–111)
Creatinine, Ser: 0.9 mg/dL (ref 0.44–1.00)
GFR, Estimated: >60 mL/min (ref >60)
Glucose, Bld: 99 mg/dL (ref 70–99)
Potassium: 4.1 mmol/L (ref 3.5–5.1)
Sodium: 142 mmol/L (ref 135–145)
Total Bilirubin: 0.3 mg/dL (ref 0.0–1.2)
Total Protein: 7.3 g/dL (ref 6.5–8.1)

## 2024-07-26 LAB — PREGNANCY, URINE: Preg Test, Ur: NEGATIVE

## 2024-07-26 LAB — LIPASE, BLOOD: Lipase: 31 U/L (ref 11–51)

## 2024-07-26 MED ORDER — MORPHINE SULFATE (PF) 4 MG/ML IV SOLN
4.0000 mg | Freq: Once | INTRAVENOUS | Status: AC
  Administered 2024-07-26: 4 mg via INTRAVENOUS
  Filled 2024-07-26: qty 1

## 2024-07-26 MED ORDER — SODIUM CHLORIDE 0.9 % IV SOLN
12.5000 mg | Freq: Four times a day (QID) | INTRAVENOUS | Status: DC | PRN
  Administered 2024-07-27 – 2024-07-28 (×2): 12.5 mg via INTRAVENOUS
  Filled 2024-07-26 (×2): qty 12.5

## 2024-07-26 MED ORDER — DEXTROSE-SODIUM CHLORIDE 5-0.45 % IV SOLN: INTRAVENOUS | Status: AC

## 2024-07-26 MED ORDER — TOPIRAMATE 25 MG PO TABS
100.0000 mg | ORAL_TABLET | Freq: Two times a day (BID) | ORAL | Status: DC
  Administered 2024-07-26 – 2024-07-30 (×7): 100 mg via ORAL
  Filled 2024-07-26 (×9): qty 4

## 2024-07-26 MED ORDER — PANTOPRAZOLE SODIUM 40 MG PO TBEC
40.0000 mg | DELAYED_RELEASE_TABLET | Freq: Every day | ORAL | Status: DC
  Administered 2024-07-27 – 2024-07-30 (×3): 40 mg via ORAL
  Filled 2024-07-26 (×4): qty 1

## 2024-07-26 MED ORDER — ONDANSETRON HCL 4 MG/2ML IJ SOLN
4.0000 mg | Freq: Once | INTRAMUSCULAR | Status: AC
  Administered 2024-07-26: 4 mg via INTRAVENOUS
  Filled 2024-07-26: qty 2

## 2024-07-26 MED ORDER — HYDROMORPHONE HCL 1 MG/ML IJ SOLN
0.5000 mg | INTRAMUSCULAR | Status: DC | PRN
  Administered 2024-07-28: 1 mg via INTRAVENOUS
  Filled 2024-07-26 (×2): qty 1

## 2024-07-26 MED ORDER — SODIUM CHLORIDE 0.9 % IV BOLUS
1000.0000 mL | Freq: Once | INTRAVENOUS | Status: AC
  Administered 2024-07-26: 1000 mL via INTRAVENOUS

## 2024-07-26 MED ORDER — GABAPENTIN 300 MG PO CAPS
300.0000 mg | ORAL_CAPSULE | Freq: Two times a day (BID) | ORAL | Status: DC
  Administered 2024-07-26 – 2024-07-30 (×7): 300 mg via ORAL
  Filled 2024-07-26 (×8): qty 1

## 2024-07-26 MED ORDER — ENOXAPARIN SODIUM 60 MG/0.6ML IJ SOSY
50.0000 mg | PREFILLED_SYRINGE | Freq: Every day | INTRAMUSCULAR | Status: DC
  Filled 2024-07-26 (×3): qty 0.6

## 2024-07-26 MED ORDER — RIMEGEPANT SULFATE 75 MG PO TBDP: 75.0000 mg | ORAL_TABLET | Freq: Every day | ORAL | Status: DC | PRN

## 2024-07-26 MED ORDER — ONDANSETRON HCL 4 MG/2ML IJ SOLN
4.0000 mg | Freq: Four times a day (QID) | INTRAMUSCULAR | Status: DC | PRN
  Administered 2024-07-26 – 2024-07-29 (×4): 4 mg via INTRAVENOUS
  Filled 2024-07-26 (×4): qty 2

## 2024-07-26 MED ORDER — IOHEXOL 300 MG/ML  SOLN
100.0000 mL | Freq: Once | INTRAMUSCULAR | Status: AC | PRN
  Administered 2024-07-26: 100 mL via INTRAVENOUS

## 2024-07-26 MED ORDER — DULOXETINE HCL 30 MG PO CPEP
30.0000 mg | ORAL_CAPSULE | Freq: Every day | ORAL | Status: DC
  Administered 2024-07-27 – 2024-07-30 (×4): 30 mg via ORAL
  Filled 2024-07-26 (×4): qty 1

## 2024-07-26 MED ORDER — ALPRAZOLAM 0.5 MG PO TABS
0.5000 mg | ORAL_TABLET | Freq: Every day | ORAL | Status: DC | PRN
  Administered 2024-07-26: 0.5 mg via ORAL
  Filled 2024-07-26: qty 1

## 2024-07-26 MED ORDER — ENOXAPARIN SODIUM 40 MG/0.4ML IJ SOSY: 40.0000 mg | PREFILLED_SYRINGE | INTRAMUSCULAR | Status: DC

## 2024-07-26 MED ORDER — DIATRIZOATE MEGLUMINE & SODIUM 66-10 % PO SOLN
90.0000 mL | Freq: Once | ORAL | Status: AC
  Administered 2024-07-27: 90 mL via ORAL
  Filled 2024-07-26: qty 90

## 2024-07-26 MED ORDER — TIZANIDINE HCL 2 MG PO TABS
4.0000 mg | ORAL_TABLET | Freq: Four times a day (QID) | ORAL | Status: DC | PRN
  Administered 2024-07-27 – 2024-07-29 (×2): 4 mg via ORAL
  Filled 2024-07-26 (×2): qty 2

## 2024-07-26 NOTE — H&P (Signed)
 History and Physical    Patient: [name] Sanchez FMW:[id number] DOB: 14-Aug-1996 DOA: 07/26/2024 DOS: the patient was seen and examined on 07/26/2024 PCP: Joshua Santana CROME, NP  Patient coming from: Home  Chief Complaint:  Chief Complaint  Patient presents with   Abdominal Pain   HPI: [name] Sanchez is a 28 y.o. female with a history of migraines, hysterectomy due to menorrhagia/endometriosis, anxiety who presented to the ED due to lower abdominal pain. She noticed somewhat abrupt onset of pressure like abdominal pain in the far low abdomen radiating into the groin that has been present regardless of position, eating, or BMs, worse with walking. This has been associated with significant nausea but no vomiting. LBM was yesterday at 3:30, no blood. She's never had this happen before. Pain is severe, improved with morphine  in ED.   Work up in [facility] ED revealed normal ovaries and vasculature by pelvic U/S and doppler, but CT showed fluid-filled distended small bowel loops (only up to 2.4cm though) possibly associated with right pelvic bowel adhesion. Distal ileal loops appeared decompressed. Surgery was consulted and recommended medicine admission, small bowel protocol for partial SBO.   Review of Systems: As mentioned in the history of present illness. All other systems reviewed and are negative. Past Medical History:  Diagnosis Date   Anxiety    Complication of anesthesia    wakes up crying   Depression    Endometriosis    Family history of adverse reaction to anesthesia    hard to wake up, low heart rate   Gestational hypertension 01/2012   also had postpartum preelampsia   History of stomach ulcers    Hx of ovarian cyst    x 2-3   Hx of varicella    IBS (irritable bowel syndrome)    Migraine    chronic   Panic attack    Pinched nerve    left arm   Plica of knee 08/2012   medial plica left knee   Past Surgical History:  Procedure Laterality Date   KNEE ARTHROSCOPY  08/17/2012    Procedure: ARTHROSCOPY KNEE;  Surgeon: Norleen CROME Gavel, MD;  Location: [facility];  Service: Orthopedics;  Laterality: Left;  left knee scope with tight lateral retinacular release and plica removal   TYMPANOSTOMY TUBE PLACEMENT Bilateral 1998   VAGINAL HYSTERECTOMY Bilateral 08/14/2018   Procedure: HYSTERECTOMY VAGINAL WITH SALPINGECTOMY;  Surgeon: Fredirick Glenys RAMAN, MD;  Location: [facility];  Service: Gynecology;  Laterality: Bilateral;   Social History:  reports that she has never smoked. She has never used smokeless tobacco. She reports that she does not currently use alcohol. She reports that she does not use drugs.  Allergies  Allergen Reactions   Compazine  [Prochlorperazine ] Other (See Comments)   Percocet [Oxycodone -Acetaminophen ] Hives and Itching   Cefuroxime Axetil Rash   Penicillins Rash    Family History  Problem Relation Age of Onset   Hypertension Mother    Heart disease Mother        MI   Anesthesia problems Mother        post-op N/V   Depression Mother    Anxiety disorder Mother    Obesity Mother    Congestive Heart Failure Mother    Heart disease Father    Bipolar disorder Father    Hyperlipidemia Father    Asthma Sister    Bipolar disorder Sister    Asthma Brother    Diabetes Maternal Aunt    Uterine cancer Maternal Grandmother  Pancreatic cancer Maternal Grandmother    Lung cancer Maternal Grandfather    Migraines Neg Hx     Prior to Admission medications   Medication Sig Start Date End Date Taking? Authorizing Provider  AJOVY 225 MG/1.5ML SOAJ Inject 225 mg into the skin every 28 (twenty-eight) days. 06/12/24  Yes [provider]  cloNIDine (CATAPRES - DOSED IN MG/24 HR) 0.1 mg/24hr patch Place 0.1 mg onto the skin once a week. 03/14/24  Yes [provider]  DULoxetine  (CYMBALTA ) 30 MG capsule Take 30 mg by mouth See admin instructions. Take 1 capsule (30 mg total) by mouth daily. X one week, then two daily. 04/12/24 04/12/25 Yes  [provider]  gabapentin  (NEURONTIN ) 300 MG capsule Take 300 mg by mouth 2 (two) times daily. 12/27/22  Yes [provider]  pantoprazole  (PROTONIX ) 40 MG tablet Take 40 mg by mouth daily. 01/25/23  Yes [provider]  topiramate  (TOPAMAX ) 100 MG tablet Take 100 mg by mouth 2 (two) times daily. 07/01/24  Yes [provider]  acetaZOLAMIDE  (DIAMOX ) 250 MG tablet Take 1 tablet (250 mg total) by mouth 2 (two) times daily. 08/03/21   Ines Onetha NOVAK, MD  ALPRAZolam  (XANAX ) 0.5 MG tablet Take 1 tablet (0.5 mg total) by mouth daily as needed for anxiety. 11/12/18   Danford, Rockie D, NP  Erenumab -aooe (AIMOVIG ) 140 MG/ML SOAJ Inject 140 mg into the skin every 30 (thirty) days. 08/03/21   Ines Onetha NOVAK, MD  Galcanezumab -gnlm (EMGALITY ) 120 MG/ML SOAJ First month loading dose: Inject the contents of 2 pens (2 mls total) into the skin once for 1 dose 12/03/21     methylPREDNISolone  (MEDROL  DOSEPAK) 4 MG TBPK tablet Take pills all together with food for 6 days 6-5-4-3-2-1 08/03/21   Ines Onetha NOVAK, MD  metoCLOPramide  (REGLAN ) 10 MG tablet Take 1 tablet (10 mg total) by mouth 3 (three) times daily as needed. For nausea or migraine. 08/03/21   Ines Onetha NOVAK, MD  ondansetron  (ZOFRAN -ODT) 4 MG disintegrating tablet Take 1-2 tablets (4-8 mg total) by mouth every 8 (eight) hours as needed. 08/03/21   Ines Onetha NOVAK, MD  promethazine  (PHENERGAN ) 12.5 MG tablet Take 1 tablet (12.5 mg total) by mouth every 6 (six) hours as needed for nausea or vomiting. 08/03/21   Ines Onetha NOVAK, MD  Rimegepant Sulfate  (NURTEC) 75 MG TBDP Take 75 mg by mouth daily as needed. For migraines. Take as close to onset of migraine as possible. One daily maximum. 08/03/21   Ines Onetha NOVAK, MD  tiZANidine  (ZANAFLEX ) 4 MG tablet Take 1 tablet (4 mg total) by mouth every 6 (six) hours as needed for muscle spasms. Or headaches. 08/03/21   Ines Onetha NOVAK, MD  Ubrogepant  (UBRELVY ) 100 MG TABS Take 100 mg by  mouth every 2 (two) hours as needed. 08/03/21   Ines Onetha NOVAK, MD    Physical Exam: Vitals:   07/26/24 1600 07/26/24 1630 07/26/24 2017 07/26/24 2134  BP: 118/74 114/69  120/80  Pulse: 82 83  75  Resp:  18  16  Temp:   98 F (36.7 C) 98.2 F (36.8 C)  TempSrc:   Oral Oral  SpO2: 100% 100%  100%  Weight:      Height:      Gen: Anxious-appearing, exceedingly pleasant female in no distress Pulm: Clear, nonlabored  CV: RRR, no MRG or pitting edema GI: Soft, nondistended. +tenderness to deeper palpation in lower quadrants without rebound or guarding. Present bowel sounds/hypoactive. Neuro: Alert  and oriented. No new focal deficits. Ext: Warm, no deformities. Skin: No rashes, lesions or ulcers on visualized skin   Data Reviewed: Bicarb is 20. CBC and CMP otherwise wnl.  UA dipstick negative.  UPT: Negative.  CT abd/pelvis: 1. A cluster small bowel loops in the pelvis are fluid-filled and distended up to a bowel 2.4 cm maximum diameter. There is a small bowel adhesion in the right pelvis which may result in a component of small-bowel stricture or partial obstruction as distal ileal loops extending into the terminal ileum are completely decompressed. No bowel wall thickening or pneumatosis. No intraperitoneal free fluid. 2. Appendix unremarkable. 3. No evidence for adnexal mass.  Assessment and Plan: Partial SBO: Likely related to post-hysterectomy adhesions. No peritonitis by exam, VSS. She's had no actual vomitus, but continues to have severe pain.  - Insert NGT after anxiolytic given, gastrografin  protocol as ordered.  - IV zofran  prn N/V, IV phenergan  prn refractory N/V - IV dilaudid  prn severe pain - D5 1/2NS @ 100cc/hr overnight, monitor electrolytes.   Migraine: Severe symptoms managed with multiple medications.  - Will attempt po administration of home meds now and change to IV/per tube as necessary going forward.   Anxiety:  - Give xanax  if tolerates, if not would  order IV ativan  prn.    Advance Care Planning: Full code  Consults: General surgery, Dr. Paola. Exam reassuring, could await AM evaluation.   Family Communication: None at bedside  Severity of Illness: The appropriate patient status for this patient is OBSERVATION. Observation status is judged to be reasonable and necessary in order to provide the required intensity of service to ensure the patient's safety. The patient's presenting symptoms, physical exam findings, and initial radiographic and laboratory data in the context of their medical condition is felt to place them at decreased risk for further clinical deterioration. Furthermore, it is anticipated that the patient will be medically stable for discharge from the hospital within 2 midnights of admission.   Author: Bernardino KATHEE Come, MD 07/26/2024 10:32 PM  For on call review [email or web address].

## 2024-07-26 NOTE — ED Triage Notes (Addendum)
 Patient arrives POV with complaints of worsening left side abdominal pain x3 days. Patient was referred here by Urgent Care to rule out  ovarian torsion vs. Appendicitis. Patient also voices concerns that her symptoms could be kidney related due to having trace blood in her urine at [facility].  Pain is a 9/10.

## 2024-07-26 NOTE — Consult Note (Addendum)
 Reason for Consult/Chief Complaint: SBO Consultant: Sanchez Sanchez  Sanchez Sanchez is an 28 y.o. female.   HPI: 40F p/w nausea and lower abdominal pain. Denies vomiting, last BM 330 yesterday. Seen at [facility] and found to have pSBO.    Past Medical History:  Diagnosis Date   Anxiety    Complication of anesthesia    wakes up crying   Depression    Endometriosis    Family history of adverse reaction to anesthesia    hard to wake up, low heart rate   Gestational hypertension 01/2012   also had postpartum preelampsia   History of stomach ulcers    Hx of ovarian cyst    x 2-3   Hx of varicella    IBS (irritable bowel syndrome)    Migraine    chronic   Panic attack    Pinched nerve    left arm   Plica of knee 08/2012   medial plica left knee    Past Surgical History:  Procedure Laterality Date   KNEE ARTHROSCOPY  08/17/2012   Procedure: ARTHROSCOPY KNEE;  Surgeon: [name] LITTIE Gavel, MD;  Location: [facility];  Service: Orthopedics;  Laterality: Left;  left knee scope with tight lateral retinacular release and plica removal   TYMPANOSTOMY TUBE PLACEMENT Bilateral 1998   VAGINAL HYSTERECTOMY Bilateral 08/14/2018   Procedure: HYSTERECTOMY VAGINAL WITH SALPINGECTOMY;  Surgeon: Sanchez Glenys RAMAN, MD;  Location: [facility];  Service: Gynecology;  Laterality: Bilateral;    Family History  Problem Relation Age of Onset   Hypertension Mother    Heart disease Mother        MI   Anesthesia problems Mother        post-op N/V   Depression Mother    Anxiety disorder Mother    Obesity Mother    Congestive Heart Failure Mother    Heart disease Father    Bipolar disorder Father    Hyperlipidemia Father    Asthma Sister    Bipolar disorder Sister    Asthma Brother    Diabetes Maternal Aunt    Uterine cancer Maternal Grandmother    Pancreatic cancer Maternal Grandmother    Lung cancer Maternal Grandfather    Migraines Neg Hx     Social History:  reports that she has never  smoked. She has never used smokeless tobacco. She reports that she does not currently use alcohol. She reports that she does not use drugs.  Allergies:  Allergies  Allergen Reactions   Compazine  [Prochlorperazine ] Other (See Comments)   Percocet [Oxycodone -Acetaminophen ] Hives and Itching   Cefuroxime Axetil Rash   Penicillins Rash    Medications: I have reviewed the patient's current medications.  Results for orders placed or performed during the hospital encounter of 07/26/24 (from the past 48 hours)  Lipase, blood     Status: None   Collection Time: 07/26/24  1:51 PM  Result Value Ref Range   Lipase 31 11 - 51 U/L    Comment: Performed at [facility], [address], [place], [place] [address]  Comprehensive metabolic panel     Status: Abnormal   Collection Time: 07/26/24  1:51 PM  Result Value Ref Range   Sodium 142 135 - 145 mmol/L   Potassium 4.1 3.5 - 5.1 mmol/L   Chloride 109 98 - 111 mmol/L   CO2 20 (L) 22 - 32 mmol/L   Glucose, Bld 99 70 - 99 mg/dL    Comment: Glucose reference  range applies only to samples taken after fasting for at least 8 hours.   BUN 8 6 - 20 mg/dL   Creatinine, Ser 9.09 0.44 - 1.00 mg/dL   Calcium 9.7 8.9 - 89.6 mg/dL   Total Protein 7.3 6.5 - 8.1 g/dL   Albumin 4.4 3.5 - 5.0 g/dL   AST 18 15 - 41 U/L   ALT 18 0 - 44 U/L   Alkaline Phosphatase 64 38 - 126 U/L   Total Bilirubin 0.3 0.0 - 1.2 mg/dL   GFR, Estimated >39 >39 mL/min    Comment: (NOTE) Calculated using the CKD-EPI Creatinine Equation (2021)    Anion gap 13 5 - 15    Comment: Performed at [facility], [address], [place], [place] [address]  CBC     Status: None   Collection Time: 07/26/24  1:51 PM  Result Value Ref Range   WBC 6.3 4.0 - 10.5 K/uL   RBC 4.10 3.87 - 5.11 MIL/uL   Hemoglobin 12.8 12.0 - 15.0 g/dL   HCT 61.7 63.9 - 53.9 %   MCV 93.2 80.0 - 100.0 fL   MCH 31.2 26.0 - 34.0 pg   MCHC 33.5 30.0 - 36.0 g/dL   RDW 87.5  88.4 - 84.4 %   Platelets 251 150 - 400 K/uL   nRBC 0.0 0.0 - 0.2 %    Comment: Performed at [facility], [address], [place], [place] [address]  Urinalysis, Routine w reflex microscopic -Urine, Clean Catch     Status: Abnormal   Collection Time: 07/26/24  1:51 PM  Result Value Ref Range   Color, Urine COLORLESS (A) YELLOW   APPearance CLEAR CLEAR   Specific Gravity, Urine 1.005 1.005 - 1.030   pH 7.0 5.0 - 8.0   Glucose, UA NEGATIVE NEGATIVE mg/dL   Hgb urine dipstick NEGATIVE NEGATIVE   Bilirubin Urine NEGATIVE NEGATIVE   Ketones, ur NEGATIVE NEGATIVE mg/dL   Protein, ur NEGATIVE NEGATIVE mg/dL   Nitrite NEGATIVE NEGATIVE   Leukocytes,Ua NEGATIVE NEGATIVE    Comment: Performed at [facility], [address], [place], [place] [address]  Pregnancy, urine     Status: None   Collection Time: 07/26/24  2:00 PM  Result Value Ref Range   Preg Test, Ur NEGATIVE NEGATIVE    Comment:        THE SENSITIVITY OF THIS METHODOLOGY IS >20 mIU/mL. Performed at [facility], [address] [place], [place] [address]     CT ABDOMEN PELVIS W CONTRAST Result Date: 07/26/2024 CLINICAL DATA:  3 day history of worsening left-sided abdominal pain. EXAM: CT ABDOMEN AND PELVIS WITH CONTRAST TECHNIQUE: Multidetector CT imaging of the abdomen and pelvis was performed using the standard protocol following bolus administration of intravenous contrast. RADIATION DOSE REDUCTION: This exam was performed according to the departmental dose-optimization program which includes automated exposure control, adjustment of the mA and/or kV according to patient size and/or use of iterative reconstruction technique. CONTRAST:  OMNIPAQUE  IOHEXOL  300 MG/ML  SOLN COMPARISON:  Pelvic ultrasound earlier same day. Abdomen pelvis CT 03/30/2022 FINDINGS: Lower chest: No acute findings. Hepatobiliary: No suspicious focal abnormality within the liver parenchyma.  Small area of low attenuation in the anterior liver, adjacent to the falciform ligament, is in a characteristic location for focal fatty deposition. There is no evidence for gallstones, gallbladder wall thickening, or pericholecystic fluid. No intrahepatic or extrahepatic biliary dilation. Pancreas: No focal mass lesion. No dilatation of the main duct.  No intraparenchymal cyst. No peripancreatic edema. Spleen: No splenomegaly. No suspicious focal mass lesion. Adrenals/Urinary Tract: No adrenal nodule or mass. Kidneys unremarkable. No evidence for hydroureter. The urinary bladder appears normal for the degree of distention. Stomach/Bowel: Stomach is unremarkable. No gastric wall thickening. No evidence of outlet obstruction. Duodenum is normally positioned as is the ligament of Treitz. Proximal small bowel is nondistended. Small bowel loops in the pelvis are fluid-filled and distended up to a bowel 2.4 cm maximum diameter. There is clustered small bowel the central pelvis (see axial 67/2) with a configuration raising the question of internal hernia although this possibility seems less likely on coronal and sagittal imaging. There is a small bowel adhesion in the right pelvis (59/2 axial and 65/4 coronal which may result in a component of small-bowel obstruction as distal ileal loops extending into the terminal ileum are completely decompressed. The appendix is best seen on coronal images and is unremarkable. Vascular/Lymphatic: No abdominal aortic aneurysm. No abdominal aortic atherosclerotic calcification. There is no gastrohepatic or hepatoduodenal ligament lymphadenopathy. No retroperitoneal or mesenteric lymphadenopathy. No pelvic sidewall lymphadenopathy. Reproductive: Uterus surgically absent. Left ovary unremarkable. Right ovary is position along the medial wall the cecum without a discrete ovarian mass lesion evident. Other: No intraperitoneal free fluid. Musculoskeletal: No worrisome lytic or sclerotic  osseous abnormality. IMPRESSION: 1. A cluster small bowel loops in the pelvis are fluid-filled and distended up to a bowel 2.4 cm maximum diameter. There is a small bowel adhesion in the right pelvis which may result in a component of small-bowel stricture or partial obstruction as distal ileal loops extending into the terminal ileum are completely decompressed. No bowel wall thickening or pneumatosis. No intraperitoneal free fluid. 2. Appendix unremarkable. 3. No evidence for adnexal mass. Electronically Signed   By: Camellia Candle M.D.   On: 07/26/2024 17:14   US  Pelvis Complete Result Date: 07/26/2024 CLINICAL DATA:  LLQ pain. EXAM: TRANSABDOMINAL AND TRANSVAGINAL ULTRASOUND OF PELVIS DOPPLER ULTRASOUND OF OVARIES TECHNIQUE: Both transabdominal and transvaginal ultrasound examinations of the pelvis were performed. Transabdominal technique was performed for global imaging of the pelvis including uterus, ovaries, adnexal regions, and pelvic cul-de-sac. It was necessary to proceed with endovaginal exam following the transabdominal exam to visualize the bilateral ovaries and bilateral adnexa. Color and duplex Doppler ultrasound was utilized to evaluate blood flow to the ovaries. COMPARISON:  None Available. FINDINGS: Uterus Surgically absent. Right ovary Measurements: 2.3 x 2.6 x 3.7 cm = volume: 11.6 mL. Normal appearance/no adnexal mass. Left ovary Measurements: 2.0 x 2.4 x 3.8 cm = volume: 9.7 mL. Normal appearance/no adnexal mass. Pulsed Doppler evaluation of both ovaries demonstrates normal low-resistance arterial and venous waveforms. Other findings No abnormal free fluid. IMPRESSION: Surgically absent uterus. Otherwise unremarkable exam. Electronically Signed   By: Ree Molt M.D.   On: 07/26/2024 15:21   US  Transvaginal Non-OB Result Date: 07/26/2024 CLINICAL DATA:  LLQ pain. EXAM: TRANSABDOMINAL AND TRANSVAGINAL ULTRASOUND OF PELVIS DOPPLER ULTRASOUND OF OVARIES TECHNIQUE: Both transabdominal and  transvaginal ultrasound examinations of the pelvis were performed. Transabdominal technique was performed for global imaging of the pelvis including uterus, ovaries, adnexal regions, and pelvic cul-de-sac. It was necessary to proceed with endovaginal exam following the transabdominal exam to visualize the bilateral ovaries and bilateral adnexa. Color and duplex Doppler ultrasound was utilized to evaluate blood flow to the ovaries. COMPARISON:  None Available. FINDINGS: Uterus Surgically absent. Right ovary Measurements: 2.3 x 2.6 x 3.7 cm = volume: 11.6 mL. Normal appearance/no adnexal mass. Left  ovary Measurements: 2.0 x 2.4 x 3.8 cm = volume: 9.7 mL. Normal appearance/no adnexal mass. Pulsed Doppler evaluation of both ovaries demonstrates normal low-resistance arterial and venous waveforms. Other findings No abnormal free fluid. IMPRESSION: Surgically absent uterus. Otherwise unremarkable exam. Electronically Signed   By: Ree Molt M.D.   On: 07/26/2024 15:21   US  Art/Ven Flow Abd Pelv Doppler Result Date: 07/26/2024 CLINICAL DATA:  LLQ pain. EXAM: TRANSABDOMINAL AND TRANSVAGINAL ULTRASOUND OF PELVIS DOPPLER ULTRASOUND OF OVARIES TECHNIQUE: Both transabdominal and transvaginal ultrasound examinations of the pelvis were performed. Transabdominal technique was performed for global imaging of the pelvis including uterus, ovaries, adnexal regions, and pelvic cul-de-sac. It was necessary to proceed with endovaginal exam following the transabdominal exam to visualize the bilateral ovaries and bilateral adnexa. Color and duplex Doppler ultrasound was utilized to evaluate blood flow to the ovaries. COMPARISON:  None Available. FINDINGS: Uterus Surgically absent. Right ovary Measurements: 2.3 x 2.6 x 3.7 cm = volume: 11.6 mL. Normal appearance/no adnexal mass. Left ovary Measurements: 2.0 x 2.4 x 3.8 cm = volume: 9.7 mL. Normal appearance/no adnexal mass. Pulsed Doppler evaluation of both ovaries demonstrates  normal low-resistance arterial and venous waveforms. Other findings No abnormal free fluid. IMPRESSION: Surgically absent uterus. Otherwise unremarkable exam. Electronically Signed   By: Ree Molt M.D.   On: 07/26/2024 15:21    ROS 10 point review of systems is negative except as listed above in HPI.   Physical Exam Blood pressure 114/69, pulse 83, temperature 99 F (37.2 C), temperature source Oral, resp. rate 18, height 5' 4 (1.626 m), weight 103.4 kg, last menstrual period 07/15/2018, SpO2 100%. Constitutional: well-developed, well-nourished HEENT: pupils equal, round, reactive to light, 2mm b/l, moist conjunctiva, external inspection of ears and nose normal, hearing intact Oropharynx: normal oropharyngeal mucosa, normal dentition Neck: no thyromegaly, trachea midline, no midline cervical tenderness to palpation Chest: breath sounds equal bilaterally, normal respiratory effort, no midline or lateral chest wall tenderness to palpation/deformity Abdomen: soft, NT, no bruising, no hepatosplenomegaly Skin: warm, dry, no rashes Psych: normal memory, normal mood/affect     Assessment/Plan: pSBO - SBO protocol, awaiting 8h CXR FEN - NPO except sips/chips, NGT  DVT - SCDs, LMWH Dispo - med-surg    Sanchez GEANNIE Hanger, MD General and Trauma Surgery [facility]

## 2024-07-26 NOTE — ED Provider Notes (Signed)
 [place] [facility]PARTMENT AT [facility] Provider Note   CSN: [id number] Arrival date & time: 07/26/24  1323     Patient presents with: Abdominal Pain  HPI [name] Sanchez is a 28 y.o. female presenting for abdominal pain.  Started all of a sudden yesterday afternoon.  Pain is in the left lower quadrant.  She states it is a sharp pain that radiates to her back.  She denies urinary or vaginal symptoms.  Dors is nausea but no vomiting or diarrhea.  Has had a normal bowel movement today.  Was seen earlier today by urgent care who sent her here for concern for ovarian torsion.  She does report prior history of hysterectomy and ovarian cyst.    Abdominal Pain      Prior to Admission medications   Medication Sig Start Date End Date Taking? Authorizing Provider  AJOVY 225 MG/1.5ML SOAJ Inject 225 mg into the skin every 28 (twenty-eight) days. 06/12/24  Yes [provider]  cloNIDine (CATAPRES - DOSED IN MG/24 HR) 0.1 mg/24hr patch Place 0.1 mg onto the skin once a week. 03/14/24  Yes [provider]  DULoxetine  (CYMBALTA ) 30 MG capsule Take 30 mg by mouth See admin instructions. Take 1 capsule (30 mg total) by mouth daily. X one week, then two daily. 04/12/24 04/12/25 Yes [provider]  gabapentin  (NEURONTIN ) 300 MG capsule Take 300 mg by mouth 2 (two) times daily. 12/27/22  Yes [provider]  pantoprazole  (PROTONIX ) 40 MG tablet Take 40 mg by mouth daily. 01/25/23  Yes [provider]  topiramate  (TOPAMAX ) 100 MG tablet Take 100 mg by mouth 2 (two) times daily. 07/01/24  Yes [provider]  acetaZOLAMIDE  (DIAMOX ) 250 MG tablet Take 1 tablet (250 mg total) by mouth 2 (two) times daily. 08/03/21   Ines Onetha NOVAK, MD  ALPRAZolam  (XANAX ) 0.5 MG tablet Take 1 tablet (0.5 mg total) by mouth daily as needed for anxiety. 11/12/18   Danford, Rockie D, NP  Erenumab -aooe (AIMOVIG ) 140 MG/ML SOAJ Inject 140 mg into the skin every 30 (thirty)  days. 08/03/21   Ines Onetha NOVAK, MD  Galcanezumab -gnlm (EMGALITY ) 120 MG/ML SOAJ First month loading dose: Inject the contents of 2 pens (2 mls total) into the skin once for 1 dose 12/03/21     methylPREDNISolone  (MEDROL  DOSEPAK) 4 MG TBPK tablet Take pills all together with food for 6 days 6-5-4-3-2-1 08/03/21   Ines Onetha NOVAK, MD  metoCLOPramide  (REGLAN ) 10 MG tablet Take 1 tablet (10 mg total) by mouth 3 (three) times daily as needed. For nausea or migraine. 08/03/21   Ines Onetha NOVAK, MD  ondansetron  (ZOFRAN -ODT) 4 MG disintegrating tablet Take 1-2 tablets (4-8 mg total) by mouth every 8 (eight) hours as needed. 08/03/21   Ines Onetha NOVAK, MD  promethazine  (PHENERGAN ) 12.5 MG tablet Take 1 tablet (12.5 mg total) by mouth every 6 (six) hours as needed for nausea or vomiting. 08/03/21   Ines Onetha NOVAK, MD  Rimegepant Sulfate  (NURTEC) 75 MG TBDP Take 75 mg by mouth daily as needed. For migraines. Take as close to onset of migraine as possible. One daily maximum. 08/03/21   Ines Onetha NOVAK, MD  tiZANidine  (ZANAFLEX ) 4 MG tablet Take 1 tablet (4 mg total) by mouth every 6 (six) hours as needed for muscle spasms. Or headaches. 08/03/21   Ines Onetha NOVAK, MD  Ubrogepant  (UBRELVY ) 100 MG TABS Take 100 mg by mouth every 2 (two) hours as needed. 08/03/21   Ines,  Onetha NOVAK, MD    Allergies: Compazine  [prochlorperazine ], Percocet [oxycodone -acetaminophen ], Cefuroxime axetil, and Penicillins    Review of Systems  Gastrointestinal:  Positive for abdominal pain.    Updated Vital Signs BP 114/69   Pulse 83   Temp 99 F (37.2 C) (Oral)   Resp 18   Ht 5' 4 (1.626 m)   Wt 103.4 kg   LMP 07/15/2018 (Exact Date)   SpO2 100%   BMI 39.14 kg/m   Physical Exam Vitals and nursing note reviewed.  HENT:     Head: Normocephalic and atraumatic.     Mouth/Throat:     Mouth: Mucous membranes are moist.  Eyes:     General:        Right eye: No discharge.        Left eye: No discharge.      Conjunctiva/sclera: Conjunctivae normal.  Cardiovascular:     Rate and Rhythm: Normal rate and regular rhythm.     Pulses: Normal pulses.     Heart sounds: Normal heart sounds.  Pulmonary:     Effort: Pulmonary effort is normal.     Breath sounds: Normal breath sounds.  Abdominal:     General: Abdomen is flat. There is no distension.     Palpations: Abdomen is soft.     Tenderness: There is abdominal tenderness in the left lower quadrant.  Skin:    General: Skin is warm and dry.  Neurological:     General: No focal deficit present.  Psychiatric:        Mood and Affect: Mood normal.     (all labs ordered are listed, but only abnormal results are displayed) Labs Reviewed  COMPREHENSIVE METABOLIC PANEL WITH GFR - Abnormal; Notable for the following components:      Result Value   CO2 20 (*)    All other components within normal limits  URINALYSIS, ROUTINE W REFLEX MICROSCOPIC - Abnormal; Notable for the following components:   Color, Urine COLORLESS (*)    All other components within normal limits  LIPASE, BLOOD  CBC  PREGNANCY, URINE    EKG: None  Radiology: CT ABDOMEN PELVIS W CONTRAST Result Date: 07/26/2024 CLINICAL DATA:  3 day history of worsening left-sided abdominal pain. EXAM: CT ABDOMEN AND PELVIS WITH CONTRAST TECHNIQUE: Multidetector CT imaging of the abdomen and pelvis was performed using the standard protocol following bolus administration of intravenous contrast. RADIATION DOSE REDUCTION: This exam was performed according to the departmental dose-optimization program which includes automated exposure control, adjustment of the mA and/or kV according to patient size and/or use of iterative reconstruction technique. CONTRAST:  OMNIPAQUE  IOHEXOL  300 MG/ML  SOLN COMPARISON:  Pelvic ultrasound earlier same day. Abdomen pelvis CT 03/30/2022 FINDINGS: Lower chest: No acute findings. Hepatobiliary: No suspicious focal abnormality within the liver parenchyma. Small  area of low attenuation in the anterior liver, adjacent to the falciform ligament, is in a characteristic location for focal fatty deposition. There is no evidence for gallstones, gallbladder wall thickening, or pericholecystic fluid. No intrahepatic or extrahepatic biliary dilation. Pancreas: No focal mass lesion. No dilatation of the main duct. No intraparenchymal cyst. No peripancreatic edema. Spleen: No splenomegaly. No suspicious focal mass lesion. Adrenals/Urinary Tract: No adrenal nodule or mass. Kidneys unremarkable. No evidence for hydroureter. The urinary bladder appears normal for the degree of distention. Stomach/Bowel: Stomach is unremarkable. No gastric wall thickening. No evidence of outlet obstruction. Duodenum is normally positioned as is the ligament of Treitz. Proximal small bowel is nondistended. Small  bowel loops in the pelvis are fluid-filled and distended up to a bowel 2.4 cm maximum diameter. There is clustered small bowel the central pelvis (see axial 67/2) with a configuration raising the question of internal hernia although this possibility seems less likely on coronal and sagittal imaging. There is a small bowel adhesion in the right pelvis (59/2 axial and 65/4 coronal which may result in a component of small-bowel obstruction as distal ileal loops extending into the terminal ileum are completely decompressed. The appendix is best seen on coronal images and is unremarkable. Vascular/Lymphatic: No abdominal aortic aneurysm. No abdominal aortic atherosclerotic calcification. There is no gastrohepatic or hepatoduodenal ligament lymphadenopathy. No retroperitoneal or mesenteric lymphadenopathy. No pelvic sidewall lymphadenopathy. Reproductive: Uterus surgically absent. Left ovary unremarkable. Right ovary is position along the medial wall the cecum without a discrete ovarian mass lesion evident. Other: No intraperitoneal free fluid. Musculoskeletal: No worrisome lytic or sclerotic osseous  abnormality. IMPRESSION: 1. A cluster small bowel loops in the pelvis are fluid-filled and distended up to a bowel 2.4 cm maximum diameter. There is a small bowel adhesion in the right pelvis which may result in a component of small-bowel stricture or partial obstruction as distal ileal loops extending into the terminal ileum are completely decompressed. No bowel wall thickening or pneumatosis. No intraperitoneal free fluid. 2. Appendix unremarkable. 3. No evidence for adnexal mass. Electronically Signed   By: Camellia Candle M.D.   On: 07/26/2024 17:14   US  Pelvis Complete Result Date: 07/26/2024 CLINICAL DATA:  LLQ pain. EXAM: TRANSABDOMINAL AND TRANSVAGINAL ULTRASOUND OF PELVIS DOPPLER ULTRASOUND OF OVARIES TECHNIQUE: Both transabdominal and transvaginal ultrasound examinations of the pelvis were performed. Transabdominal technique was performed for global imaging of the pelvis including uterus, ovaries, adnexal regions, and pelvic cul-de-sac. It was necessary to proceed with endovaginal exam following the transabdominal exam to visualize the bilateral ovaries and bilateral adnexa. Color and duplex Doppler ultrasound was utilized to evaluate blood flow to the ovaries. COMPARISON:  None Available. FINDINGS: Uterus Surgically absent. Right ovary Measurements: 2.3 x 2.6 x 3.7 cm = volume: 11.6 mL. Normal appearance/no adnexal mass. Left ovary Measurements: 2.0 x 2.4 x 3.8 cm = volume: 9.7 mL. Normal appearance/no adnexal mass. Pulsed Doppler evaluation of both ovaries demonstrates normal low-resistance arterial and venous waveforms. Other findings No abnormal free fluid. IMPRESSION: Surgically absent uterus. Otherwise unremarkable exam. Electronically Signed   By: Ree Molt M.D.   On: 07/26/2024 15:21   US  Transvaginal Non-OB Result Date: 07/26/2024 CLINICAL DATA:  LLQ pain. EXAM: TRANSABDOMINAL AND TRANSVAGINAL ULTRASOUND OF PELVIS DOPPLER ULTRASOUND OF OVARIES TECHNIQUE: Both transabdominal and  transvaginal ultrasound examinations of the pelvis were performed. Transabdominal technique was performed for global imaging of the pelvis including uterus, ovaries, adnexal regions, and pelvic cul-de-sac. It was necessary to proceed with endovaginal exam following the transabdominal exam to visualize the bilateral ovaries and bilateral adnexa. Color and duplex Doppler ultrasound was utilized to evaluate blood flow to the ovaries. COMPARISON:  None Available. FINDINGS: Uterus Surgically absent. Right ovary Measurements: 2.3 x 2.6 x 3.7 cm = volume: 11.6 mL. Normal appearance/no adnexal mass. Left ovary Measurements: 2.0 x 2.4 x 3.8 cm = volume: 9.7 mL. Normal appearance/no adnexal mass. Pulsed Doppler evaluation of both ovaries demonstrates normal low-resistance arterial and venous waveforms. Other findings No abnormal free fluid. IMPRESSION: Surgically absent uterus. Otherwise unremarkable exam. Electronically Signed   By: Ree Molt M.D.   On: 07/26/2024 15:21   US  Art/Ven Flow Abd Pelv Doppler Result Date:  07/26/2024 CLINICAL DATA:  LLQ pain. EXAM: TRANSABDOMINAL AND TRANSVAGINAL ULTRASOUND OF PELVIS DOPPLER ULTRASOUND OF OVARIES TECHNIQUE: Both transabdominal and transvaginal ultrasound examinations of the pelvis were performed. Transabdominal technique was performed for global imaging of the pelvis including uterus, ovaries, adnexal regions, and pelvic cul-de-sac. It was necessary to proceed with endovaginal exam following the transabdominal exam to visualize the bilateral ovaries and bilateral adnexa. Color and duplex Doppler ultrasound was utilized to evaluate blood flow to the ovaries. COMPARISON:  None Available. FINDINGS: Uterus Surgically absent. Right ovary Measurements: 2.3 x 2.6 x 3.7 cm = volume: 11.6 mL. Normal appearance/no adnexal mass. Left ovary Measurements: 2.0 x 2.4 x 3.8 cm = volume: 9.7 mL. Normal appearance/no adnexal mass. Pulsed Doppler evaluation of both ovaries demonstrates  normal low-resistance arterial and venous waveforms. Other findings No abnormal free fluid. IMPRESSION: Surgically absent uterus. Otherwise unremarkable exam. Electronically Signed   By: Ree Molt M.D.   On: 07/26/2024 15:21     Procedures   Medications Ordered in the ED  diatrizoate  meglumine -sodium (GASTROGRAFIN ) 66-10 % solution 90 mL (has no administration in time range)  morphine  (PF) 4 MG/ML injection 4 mg (has no administration in time range)  sodium chloride  0.9 % bolus 1,000 mL (1,000 mLs Intravenous New Bag/Given 07/26/24 1649)  morphine  (PF) 4 MG/ML injection 4 mg (4 mg Intravenous Given 07/26/24 1647)  ondansetron  (ZOFRAN ) injection 4 mg (4 mg Intravenous Given 07/26/24 1647)  iohexol  (OMNIPAQUE ) 300 MG/ML solution 100 mL (100 mLs Intravenous Contrast Given 07/26/24 1658)                                    Medical Decision Making Amount and/or Complexity of Data Reviewed Labs: ordered. Radiology: ordered.  Risk Prescription drug management. Decision regarding hospitalization.   Initial Impression and Ddx 28 year old well-appearing female presenting for abdominal pain.  Exam notable for left lower quadrant tenderness.  DDx includes ovarian torsion, appendicitis, diverticulitis, kidney stone, bowel obstruction, PID, other. Patient PMH that increases complexity of ED encounter:  s/p hysterectomy  Interpretation of Diagnostics - I independent reviewed and interpreted the labs as followed: Nonacute  - I independently visualized the following imaging with scope of interpretation limited to determining acute life threatening conditions related to emergency care: CT, which revealed findings suggestive of partial bowel obstruction and/or stricture  Patient Reassessment and Ultimate Disposition/Management Workup suggestive of possible possible bowel obstruction.  Spoke to Dr. Paola of general surgery who advised hospital admission and small bowel protocol.  Admitted to  hospital service with Dr. Bryn.  Plan is to initiate small bowel protocol after transfer.  No witnessed vomiting during this encounter.  She remains well-appearing, no acute distress hemodynamically stable.  Pain has improved after morphine .  Awaiting transfer.  Patient management required discussion with the following services or consulting groups:  Hospitalist Service and General/Trauma Surgery  Complexity of Problems Addressed Acute complicated illness or Injury  Additional Data Reviewed and Analyzed Further history obtained from: Past medical history and medications listed in the EMR and Prior ED visit notes  Patient Encounter Risk Assessment Consideration of hospitalization      Final diagnoses:  Small bowel obstruction [facility])    ED Discharge Orders     None          [name] Norleen POUR, PA-C 07/26/24 1910    Patsey Lot, MD 07/27/24 2253

## 2024-07-26 NOTE — ED Notes (Signed)
 Robertson PA states hold off NG tube until evaluation by surgery.  Patient remains nausea free and no vomiting.

## 2024-07-27 ENCOUNTER — Observation Stay (HOSPITAL_COMMUNITY)

## 2024-07-27 DIAGNOSIS — K5651 Intestinal adhesions [bands], with partial obstruction: Secondary | ICD-10-CM | POA: Diagnosis present

## 2024-07-27 DIAGNOSIS — Z90710 Acquired absence of both cervix and uterus: Secondary | ICD-10-CM | POA: Diagnosis not present

## 2024-07-27 DIAGNOSIS — F39 Unspecified mood [affective] disorder: Secondary | ICD-10-CM | POA: Diagnosis present

## 2024-07-27 DIAGNOSIS — K581 Irritable bowel syndrome with constipation: Secondary | ICD-10-CM | POA: Diagnosis present

## 2024-07-27 DIAGNOSIS — Z6839 Body mass index (BMI) 39.0-39.9, adult: Secondary | ICD-10-CM | POA: Diagnosis not present

## 2024-07-27 DIAGNOSIS — Z881 Allergy status to other antibiotic agents status: Secondary | ICD-10-CM | POA: Diagnosis not present

## 2024-07-27 DIAGNOSIS — E8729 Other acidosis: Secondary | ICD-10-CM | POA: Diagnosis present

## 2024-07-27 DIAGNOSIS — R1032 Left lower quadrant pain: Secondary | ICD-10-CM | POA: Diagnosis present

## 2024-07-27 DIAGNOSIS — E669 Obesity, unspecified: Secondary | ICD-10-CM | POA: Diagnosis present

## 2024-07-27 DIAGNOSIS — Z818 Family history of other mental and behavioral disorders: Secondary | ICD-10-CM | POA: Diagnosis not present

## 2024-07-27 DIAGNOSIS — F410 Panic disorder [episodic paroxysmal anxiety] without agoraphobia: Secondary | ICD-10-CM | POA: Diagnosis present

## 2024-07-27 DIAGNOSIS — Z885 Allergy status to narcotic agent status: Secondary | ICD-10-CM | POA: Diagnosis not present

## 2024-07-27 DIAGNOSIS — G8929 Other chronic pain: Secondary | ICD-10-CM | POA: Diagnosis present

## 2024-07-27 DIAGNOSIS — Z888 Allergy status to other drugs, medicaments and biological substances status: Secondary | ICD-10-CM | POA: Diagnosis not present

## 2024-07-27 DIAGNOSIS — G43009 Migraine without aura, not intractable, without status migrainosus: Secondary | ICD-10-CM | POA: Diagnosis present

## 2024-07-27 DIAGNOSIS — Z8711 Personal history of peptic ulcer disease: Secondary | ICD-10-CM | POA: Diagnosis not present

## 2024-07-27 DIAGNOSIS — Z880 Allergy status to penicillin: Secondary | ICD-10-CM | POA: Diagnosis not present

## 2024-07-27 DIAGNOSIS — F32A Depression, unspecified: Secondary | ICD-10-CM | POA: Diagnosis present

## 2024-07-27 DIAGNOSIS — E878 Other disorders of electrolyte and fluid balance, not elsewhere classified: Secondary | ICD-10-CM | POA: Diagnosis present

## 2024-07-27 DIAGNOSIS — F419 Anxiety disorder, unspecified: Secondary | ICD-10-CM | POA: Diagnosis present

## 2024-07-27 DIAGNOSIS — K56609 Unspecified intestinal obstruction, unspecified as to partial versus complete obstruction: Secondary | ICD-10-CM | POA: Diagnosis not present

## 2024-07-27 DIAGNOSIS — Z791 Long term (current) use of non-steroidal anti-inflammatories (NSAID): Secondary | ICD-10-CM | POA: Diagnosis not present

## 2024-07-27 DIAGNOSIS — Z8249 Family history of ischemic heart disease and other diseases of the circulatory system: Secondary | ICD-10-CM | POA: Diagnosis not present

## 2024-07-27 DIAGNOSIS — M797 Fibromyalgia: Secondary | ICD-10-CM | POA: Diagnosis present

## 2024-07-27 DIAGNOSIS — R000 Tachycardia, unspecified: Secondary | ICD-10-CM | POA: Diagnosis not present

## 2024-07-27 DIAGNOSIS — Z833 Family history of diabetes mellitus: Secondary | ICD-10-CM | POA: Diagnosis not present

## 2024-07-27 LAB — BASIC METABOLIC PANEL WITH GFR
Anion gap: 4 — ABNORMAL LOW (ref 5–15)
BUN: 7 mg/dL (ref 6–20)
CO2: 20 mmol/L — ABNORMAL LOW (ref 22–32)
Calcium: 8.8 mg/dL — ABNORMAL LOW (ref 8.9–10.3)
Chloride: 116 mmol/L — ABNORMAL HIGH (ref 98–111)
Creatinine, Ser: 0.93 mg/dL (ref 0.44–1.00)
GFR, Estimated: >60 mL/min (ref >60)
Glucose, Bld: 102 mg/dL — ABNORMAL HIGH (ref 70–99)
Potassium: 3.5 mmol/L (ref 3.5–5.1)
Sodium: 140 mmol/L (ref 135–145)

## 2024-07-27 LAB — HIV ANTIBODY (ROUTINE TESTING W REFLEX): HIV Screen 4th Generation wRfx: NONREACTIVE

## 2024-07-27 MED ORDER — DEXTROSE-SODIUM CHLORIDE 5-0.45 % IV SOLN: INTRAVENOUS | Status: AC

## 2024-07-27 MED ORDER — MENTHOL 3 MG MT LOZG
1.0000 | LOZENGE | OROMUCOSAL | Status: DC | PRN
  Filled 2024-07-27: qty 9

## 2024-07-27 MED ORDER — LIDOCAINE VISCOUS HCL 2 % MT SOLN: 15.0000 mL | OROMUCOSAL | Status: DC | PRN

## 2024-07-27 MED ORDER — PHENOL 1.4 % MT LIQD
1.0000 | OROMUCOSAL | Status: DC | PRN
  Filled 2024-07-27: qty 177

## 2024-07-27 NOTE — Progress Notes (Signed)
 Patient asked to speak with surgeon about NGT coming out. She states, I don't want this tube in all night. After reviewing her MyChart. Sent secure chat to Dr. Dreama Hanger about patient's request. Pending response will communicate patient's request to oncoming nurse.

## 2024-07-27 NOTE — Plan of Care (Signed)

## 2024-07-27 NOTE — Hospital Course (Signed)
 PMH of migraine, hysterectomy, fibromyalgia, depression presented to hospital with complaints of worsening abdominal pain. Patient reports that she has multiple GI issues and follows up with GI outpatient. Was evaluated for gastroparesis earlier this year which was negative. 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 Does not take any medication other than magnesium  on a regular basis. Now present to the hospital with complaints of worsening abdominal pain nausea and vomiting. CT scan shows evidence of small bowel obstruction. General surgery consulted  Assessment and Plan: Partial SBO. Adhesions from prior surgery versus constipation with IBS-C. Appreciate general surgery consultation. NG tube inserted. Small bowel protocol initiated. X-ray reassuring Clamping trial successful for NG tube and therefore patient's diet is advanced to full liquid diet NG tube is now removed on 8/25. Hold off on IV fluid. Minimize narcotics. May need Reglan .  History of anxiety, mood disorder as well as chronic pain secondary to fibromyalgia Resume home medication.  Migraine. Continue home Topamax . Continue Toradol .  Hyperchloremic metabolic acidosis. Monitor.  Obesity. Body mass index is 39.14 kg/m.  Placing the patient at a high risk for poor outcome.

## 2024-07-27 NOTE — Progress Notes (Signed)
 Patient arrived in the unit. Accompanied by transport .

## 2024-07-27 NOTE — Progress Notes (Signed)
 [facility] Progress Note Patient: [name] Sanchez FMW:[id number] DOB: [birth year] DOA: 07/26/2024  DOS: the patient was seen and examined on 07/27/2024  Brief Hospital Course: PMH of migraine, hysterectomy, fibromyalgia, depression presented to hospital with complaints of worsening abdominal pain. Patient reports that she has multiple GI issues and follows up with GI outpatient. Was evaluated for gastroparesis earlier this year which was negative. 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 Does not take any medication other than magnesium  on a regular basis. Now present to the hospital with complaints of worsening abdominal pain nausea and vomiting. CT scan shows evidence of small bowel obstruction. General surgery consulted  Assessment and Plan: Partial SBO. Adhesions from prior surgery versus constipation with IBS-C. Appreciate general surgery consultation. NG tube inserted. Small bowel protocol initiated. So far no BM so far passing out gas. IV fluid IV pain medication although minimize narcotic.  History of anxiety, mood disorder as well as chronic pain secondary to urology. Medications currently on hold. Resume as soon as possible.  Migraine. Pain medication for now.  Hyperchloremic metabolic acidosis. Change IV fluids. Monitor.  Obesity. Body mass index is 39.14 kg/m.  Placing the patient at a high risk for poor outcome.   Subjective: No nausea no vomiting.  Reports headache.  Reports anxiety.  Physical Exam: Clear to auscultation. S1-S2 present  bowel sound absent. No edema.  Data Reviewed: I have Reviewed nursing notes, Vitals, and Lab results. Since last encounter, pertinent lab results CBC and BMP   . I have ordered test including CBC and BMP  .   Disposition: Status is: Inpatient Remains inpatient appropriate because: Monitor for improvement in abdominal pain.  Family Communication: No one at bedside Level of care: Med-Surg   Vitals:   07/26/24 2333  07/27/24 0429 07/27/24 0845 07/27/24 1629  BP: (!) 104/59 95/69 115/74 125/83  Pulse: 64 (!) 58 (!) 56 66  Resp: 16 16 17 17   Temp: 97.8 F (36.6 C) 97.7 F (36.5 C) 97.7 F (36.5 C) 97.8 F (36.6 C)  TempSrc: Oral Oral    SpO2: 100% 100% 99% 99%  Weight:      Height:         Author: Yetta Blanch, MD 07/27/2024 5:53 PM  Please look on [email or web address] to find out who is on call.

## 2024-07-27 NOTE — Plan of Care (Signed)
   Problem: Education: Goal: Knowledge of General Education information will improve Description Including pain rating scale, medication(s)/side effects and non-pharmacologic comfort measures Outcome: Progressing

## 2024-07-27 NOTE — Progress Notes (Signed)
 Patient complaining of throat discomfort. She says chloraseptic spray is not relieving the pain. Notified Dr. Yetta Blanch. MD put in PRN new orders for throat lozenges and lidocaine  mouth wash.

## 2024-07-28 DIAGNOSIS — K56609 Unspecified intestinal obstruction, unspecified as to partial versus complete obstruction: Secondary | ICD-10-CM | POA: Diagnosis not present

## 2024-07-28 LAB — BASIC METABOLIC PANEL WITH GFR
Anion gap: 6 (ref 5–15)
BUN: 5 mg/dL — ABNORMAL LOW (ref 6–20)
CO2: 19 mmol/L — ABNORMAL LOW (ref 22–32)
Calcium: 8.8 mg/dL — ABNORMAL LOW (ref 8.9–10.3)
Chloride: 114 mmol/L — ABNORMAL HIGH (ref 98–111)
Creatinine, Ser: 0.82 mg/dL (ref 0.44–1.00)
GFR, Estimated: >60 mL/min (ref >60)
Glucose, Bld: 95 mg/dL (ref 70–99)
Potassium: 3.9 mmol/L (ref 3.5–5.1)
Sodium: 139 mmol/L (ref 135–145)

## 2024-07-28 LAB — CBC
HCT: 35.9 % — ABNORMAL LOW (ref 36.0–46.0)
Hemoglobin: 11.9 g/dL — ABNORMAL LOW (ref 12.0–15.0)
MCH: 31.1 pg (ref 26.0–34.0)
MCHC: 33.1 g/dL (ref 30.0–36.0)
MCV: 93.7 fL (ref 80.0–100.0)
Platelets: 243 K/uL (ref 150–400)
RBC: 3.83 MIL/uL — ABNORMAL LOW (ref 3.87–5.11)
RDW: 12.5 % (ref 11.5–15.5)
WBC: 6.2 K/uL (ref 4.0–10.5)
nRBC: 0 % (ref 0.0–0.2)

## 2024-07-28 LAB — MAGNESIUM: Magnesium: 1.9 mg/dL (ref 1.7–2.4)

## 2024-07-28 MED ORDER — KETOROLAC TROMETHAMINE 30 MG/ML IJ SOLN
30.0000 mg | Freq: Four times a day (QID) | INTRAMUSCULAR | Status: DC
  Administered 2024-07-28 – 2024-07-29 (×2): 30 mg via INTRAVENOUS
  Filled 2024-07-28 (×2): qty 1

## 2024-07-28 NOTE — Progress Notes (Signed)
 Patient continues to complain about abdominal pain. She rates 8 out of 10. Notified Michael Maczis, PA-C. Ordered to continue NPO, NGT to LIWS and  PRN pain medications.

## 2024-07-28 NOTE — Plan of Care (Signed)
  Problem: Activity: Goal: Risk for activity intolerance will decrease Outcome: Progressing   Problem: Coping: Goal: Level of anxiety will decrease Outcome: Progressing   Problem: Elimination: Goal: Will not experience complications related to bowel motility Outcome: Progressing Goal: Will not experience complications related to urinary retention Outcome: Progressing   Problem: Pain Managment: Goal: General experience of comfort will improve and/or be controlled Outcome: Progressing   Problem: Safety: Goal: Ability to remain free from injury will improve Outcome: Progressing

## 2024-07-28 NOTE — Progress Notes (Signed)
 Subjective: CC: She reports sharp abdominal pain that she presented with is resolved. Now with lower abdominal crampy pain. Still nauseated and does not feel this is better. NGT with 400cc bilious output in cannister (200cc/24 hours documented). No flatus or BM.   She denies typical risk factors for SBO (no prior abdominal surgeries, abdominopelvic radiation, hernia, IBD or prior abdominopelvic infectious/inflammatory disorders tx non-op). She reports her hysterectomy was done vaginally. She does report chronic abdominal pain and issues with n/v after po intake that she has been following with GI for at [facility] that was thought to be from gastroparesis although her gastric emptying study earlier this year was normal. She reports they are working her up for a possible ulcer given chronic NSAID use, she is on a PPI and is scheduled for a EGD later this year.   Objective: Vital signs in last 24 hours: Temp:  [97.7 F (36.5 C)-98 F (36.7 C)] 97.7 F (36.5 C) (08/24 0759) Pulse Rate:  [56-66] 63 (08/24 0759) Resp:  [17] 17 (08/24 0759) BP: (103-125)/(61-83) 107/74 (08/24 0759) SpO2:  [99 %] 99 % (08/24 0759)    Intake/Output from previous day: 08/23 0701 - 08/24 0700 In: 204.4 [I.V.:204.4] Out: 200 [Emesis/NG output:200] Intake/Output this shift: No intake/output data recorded.  PE: Gen:  Alert, NAD, pleasant Abd: Soft, mild distension, lower abdominal ttp without rigidity or guarding. No obvious palpable masses or hernias.   Lab Results:  Recent Labs    07/26/24 1351 07/28/24 0609  WBC 6.3 6.2  HGB 12.8 11.9*  HCT 38.2 35.9*  PLT 251 243   BMET Recent Labs    07/27/24 0731 07/28/24 0609  NA 140 139  K 3.5 3.9  CL 116* 114*  CO2 20* 19*  GLUCOSE 102* 95  BUN 7 5*  CREATININE 0.93 0.82  CALCIUM 8.8* 8.8*   PT/INR No results for input(s): LABPROT, INR in the last 72 hours. CMP     Component Value Date/Time   NA 139 07/28/2024 0609   NA 139 05/19/2021 1408    K 3.9 07/28/2024 0609   CL 114 (H) 07/28/2024 0609   CO2 19 (L) 07/28/2024 0609   GLUCOSE 95 07/28/2024 0609   BUN 5 (L) 07/28/2024 0609   BUN 11 05/19/2021 1408   CREATININE 0.82 07/28/2024 0609   CALCIUM 8.8 (L) 07/28/2024 0609   PROT 7.3 07/26/2024 1351   PROT 7.0 05/19/2021 1408   ALBUMIN 4.4 07/26/2024 1351   ALBUMIN 4.6 05/19/2021 1408   AST 18 07/26/2024 1351   ALT 18 07/26/2024 1351   ALKPHOS 64 07/26/2024 1351   BILITOT 0.3 07/26/2024 1351   BILITOT 0.2 05/19/2021 1408   GFRNONAA >60 07/28/2024 0609   GFRAA 117 10/20/2020 1027   Lipase     Component Value Date/Time   LIPASE 31 07/26/2024 1351    Studies/Results: DG Abd Portable 1V-Small Bowel Obstruction Protocol-initial, 8 hr delay Result Date: 07/27/2024 CLINICAL DATA:  Small bowel obstruction protocol. 8 hour delayed film EXAM: PORTABLE ABDOMEN - 1 VIEW COMPARISON:  07/27/2024 FINDINGS: NG tube tip is in the distal stomach. No gaseous small bowel dilatation to suggest obstruction. Contrast material is seen along the entire length of a nondilated colon. Contrast material in the bladder is consistent with excretion of intravenous contrast material from yesterday's CT scan. IMPRESSION: No gaseous small bowel dilatation to suggest obstruction. Contrast material is seen along the entire length of a nondilated colon compatible with transit through the small  bowel. Electronically Signed   By: Camellia Candle M.D.   On: 07/27/2024 12:03   DG Abd Portable 1V-Small Bowel Protocol-Position Verification Result Date: 07/27/2024 CLINICAL DATA:  Check the NGT insertion. EXAM: PORTABLE ABDOMEN - 1 VIEW COMPARISON:  CT with IV contrast yesterday at 4:56 p.m. FINDINGS: 12:40 a.m. NGT is well placed with the tip in distal gastric body. The bowel pattern appears nonobstructive. There is contrast in the renal collecting systems and bladder. No calculus or other significant radiographic findings. IMPRESSION: NGT is well placed with the tip in  the distal gastric body. Electronically Signed   By: Francis Quam M.D.   On: 07/27/2024 00:53   CT ABDOMEN PELVIS W CONTRAST Result Date: 07/26/2024 CLINICAL DATA:  3 day history of worsening left-sided abdominal pain. EXAM: CT ABDOMEN AND PELVIS WITH CONTRAST TECHNIQUE: Multidetector CT imaging of the abdomen and pelvis was performed using the standard protocol following bolus administration of intravenous contrast. RADIATION DOSE REDUCTION: This exam was performed according to the departmental dose-optimization program which includes automated exposure control, adjustment of the mA and/or kV according to patient size and/or use of iterative reconstruction technique. CONTRAST:  OMNIPAQUE  IOHEXOL  300 MG/ML  SOLN COMPARISON:  Pelvic ultrasound earlier same day. Abdomen pelvis CT 03/30/2022 FINDINGS: Lower chest: No acute findings. Hepatobiliary: No suspicious focal abnormality within the liver parenchyma. Small area of low attenuation in the anterior liver, adjacent to the falciform ligament, is in a characteristic location for focal fatty deposition. There is no evidence for gallstones, gallbladder wall thickening, or pericholecystic fluid. No intrahepatic or extrahepatic biliary dilation. Pancreas: No focal mass lesion. No dilatation of the main duct. No intraparenchymal cyst. No peripancreatic edema. Spleen: No splenomegaly. No suspicious focal mass lesion. Adrenals/Urinary Tract: No adrenal nodule or mass. Kidneys unremarkable. No evidence for hydroureter. The urinary bladder appears normal for the degree of distention. Stomach/Bowel: Stomach is unremarkable. No gastric wall thickening. No evidence of outlet obstruction. Duodenum is normally positioned as is the ligament of Treitz. Proximal small bowel is nondistended. Small bowel loops in the pelvis are fluid-filled and distended up to a bowel 2.4 cm maximum diameter. There is clustered small bowel the central pelvis (see axial 67/2) with a  configuration raising the question of internal hernia although this possibility seems less likely on coronal and sagittal imaging. There is a small bowel adhesion in the right pelvis (59/2 axial and 65/4 coronal which may result in a component of small-bowel obstruction as distal ileal loops extending into the terminal ileum are completely decompressed. The appendix is best seen on coronal images and is unremarkable. Vascular/Lymphatic: No abdominal aortic aneurysm. No abdominal aortic atherosclerotic calcification. There is no gastrohepatic or hepatoduodenal ligament lymphadenopathy. No retroperitoneal or mesenteric lymphadenopathy. No pelvic sidewall lymphadenopathy. Reproductive: Uterus surgically absent. Left ovary unremarkable. Right ovary is position along the medial wall the cecum without a discrete ovarian mass lesion evident. Other: No intraperitoneal free fluid. Musculoskeletal: No worrisome lytic or sclerotic osseous abnormality. IMPRESSION: 1. A cluster small bowel loops in the pelvis are fluid-filled and distended up to a bowel 2.4 cm maximum diameter. There is a small bowel adhesion in the right pelvis which may result in a component of small-bowel stricture or partial obstruction as distal ileal loops extending into the terminal ileum are completely decompressed. No bowel wall thickening or pneumatosis. No intraperitoneal free fluid. 2. Appendix unremarkable. 3. No evidence for adnexal mass. Electronically Signed   By: Camellia Candle M.D.   On: 07/26/2024 17:14  US  Pelvis Complete Result Date: 07/26/2024 CLINICAL DATA:  LLQ pain. EXAM: TRANSABDOMINAL AND TRANSVAGINAL ULTRASOUND OF PELVIS DOPPLER ULTRASOUND OF OVARIES TECHNIQUE: Both transabdominal and transvaginal ultrasound examinations of the pelvis were performed. Transabdominal technique was performed for global imaging of the pelvis including uterus, ovaries, adnexal regions, and pelvic cul-de-sac. It was necessary to proceed with endovaginal  exam following the transabdominal exam to visualize the bilateral ovaries and bilateral adnexa. Color and duplex Doppler ultrasound was utilized to evaluate blood flow to the ovaries. COMPARISON:  None Available. FINDINGS: Uterus Surgically absent. Right ovary Measurements: 2.3 x 2.6 x 3.7 cm = volume: 11.6 mL. Normal appearance/no adnexal mass. Left ovary Measurements: 2.0 x 2.4 x 3.8 cm = volume: 9.7 mL. Normal appearance/no adnexal mass. Pulsed Doppler evaluation of both ovaries demonstrates normal low-resistance arterial and venous waveforms. Other findings No abnormal free fluid. IMPRESSION: Surgically absent uterus. Otherwise unremarkable exam. Electronically Signed   By: Ree Molt M.D.   On: 07/26/2024 15:21   US  Transvaginal Non-OB Result Date: 07/26/2024 CLINICAL DATA:  LLQ pain. EXAM: TRANSABDOMINAL AND TRANSVAGINAL ULTRASOUND OF PELVIS DOPPLER ULTRASOUND OF OVARIES TECHNIQUE: Both transabdominal and transvaginal ultrasound examinations of the pelvis were performed. Transabdominal technique was performed for global imaging of the pelvis including uterus, ovaries, adnexal regions, and pelvic cul-de-sac. It was necessary to proceed with endovaginal exam following the transabdominal exam to visualize the bilateral ovaries and bilateral adnexa. Color and duplex Doppler ultrasound was utilized to evaluate blood flow to the ovaries. COMPARISON:  None Available. FINDINGS: Uterus Surgically absent. Right ovary Measurements: 2.3 x 2.6 x 3.7 cm = volume: 11.6 mL. Normal appearance/no adnexal mass. Left ovary Measurements: 2.0 x 2.4 x 3.8 cm = volume: 9.7 mL. Normal appearance/no adnexal mass. Pulsed Doppler evaluation of both ovaries demonstrates normal low-resistance arterial and venous waveforms. Other findings No abnormal free fluid. IMPRESSION: Surgically absent uterus. Otherwise unremarkable exam. Electronically Signed   By: Ree Molt M.D.   On: 07/26/2024 15:21   US  Art/Ven Flow Abd Pelv  Doppler Result Date: 07/26/2024 CLINICAL DATA:  LLQ pain. EXAM: TRANSABDOMINAL AND TRANSVAGINAL ULTRASOUND OF PELVIS DOPPLER ULTRASOUND OF OVARIES TECHNIQUE: Both transabdominal and transvaginal ultrasound examinations of the pelvis were performed. Transabdominal technique was performed for global imaging of the pelvis including uterus, ovaries, adnexal regions, and pelvic cul-de-sac. It was necessary to proceed with endovaginal exam following the transabdominal exam to visualize the bilateral ovaries and bilateral adnexa. Color and duplex Doppler ultrasound was utilized to evaluate blood flow to the ovaries. COMPARISON:  None Available. FINDINGS: Uterus Surgically absent. Right ovary Measurements: 2.3 x 2.6 x 3.7 cm = volume: 11.6 mL. Normal appearance/no adnexal mass. Left ovary Measurements: 2.0 x 2.4 x 3.8 cm = volume: 9.7 mL. Normal appearance/no adnexal mass. Pulsed Doppler evaluation of both ovaries demonstrates normal low-resistance arterial and venous waveforms. Other findings No abnormal free fluid. IMPRESSION: Surgically absent uterus. Otherwise unremarkable exam. Electronically Signed   By: Ree Molt M.D.   On: 07/26/2024 15:21    Anti-infectives: Anti-infectives (From admission, onward)    None        Assessment/Plan SBO - No prior abdominal surgeries. Reports chronic upper abdominal pain and nausea that she follows with GI for.  - Contrast in colon on delayed film and no small bowel dilation   - HDS without fever, tachycardia or hypotension. No peritonitis on exam. WBC wnl. No current indication for emergency surgery - Will leave NPO and NGT to LIWS for now given ongoing pain, nausea  and bilious output in cannister despite reassuring xray. If nausea improves and has return of bowel function, can clamp NGT and start CLD. Discussed with RN.  - Keep K >=4, Phos >= 3, Mg >= 2 and mobilize for bowel function - Hopefully patient will improve with conservative management. If patient  fails to improve with conservative management, they may require exploratory surgery during admission - We will follow with you.   FEN - NPO, NGT to LIWS, IVF per primary  VTE - SCDs, Lovenox  ID - None   I reviewed nursing notes, last 24 h vitals and pain scores, last 48 h intake and output, last 24 h labs and trends, and last 24 h imaging results   LOS: 1 day    Ozell CHRISTELLA Shaper, [facility] 07/28/2024, 8:12 AM Please see Amion for pager number during day hours 7:00am-4:30pm

## 2024-07-28 NOTE — Plan of Care (Signed)

## 2024-07-28 NOTE — Progress Notes (Signed)
 [facility] Progress Note Patient: [name] Sanchez FMW:[id number] DOB: 1996-09-05 DOA: 07/26/2024  DOS: the patient was seen and examined on 07/28/2024  Brief Hospital Course: PMH of migraine, hysterectomy, fibromyalgia, depression presented to hospital with complaints of worsening abdominal pain. Patient reports that she has multiple GI issues and follows up with GI outpatient. Was evaluated for gastroparesis earlier this year which was negative. 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 Does not take any medication other than magnesium  on a regular basis. Now present to the hospital with complaints of worsening abdominal pain nausea and vomiting. CT scan shows evidence of small bowel obstruction. General surgery consulted  Assessment and Plan: Partial SBO. Adhesions from prior surgery versus constipation with IBS-C. Appreciate general surgery consultation. NG tube inserted. Small bowel protocol initiated. X-ray reassuring with ongoing symptoms of abdominal pain.  Remains NPO. IV fluid IV pain medication although minimize narcotic. May need Reglan .  History of anxiety, mood disorder as well as chronic pain secondary to fibromyalgia Medications currently on hold. Resume as soon as possible.  Migraine. Pain medication for now.  Add Toradol .  Hyperchloremic metabolic acidosis. Change IV fluids. Monitor.  Obesity. Body mass index is 39.14 kg/m.  Placing the patient at a high risk for poor outcome.   Subjective: No nausea no vomiting at the time of my evaluation but reported that earlier she had some nausea as well as abdominal pain.  Passing gas.  Reports she is having worsening headache.  Physical Exam: Bowel sound present but sluggish. No edema. No crackles.  Data Reviewed: I have Reviewed nursing notes, Vitals, and Lab results. Since last encounter, pertinent lab results CBC and BMP   . I have ordered test including CBC and BMP and x-ray abdomen  .    Disposition: Status is: Inpatient Remains inpatient appropriate because: Monitor for improvement in abdominal pain  Family Communication: No one at bedside Level of care: Med-Surg   Vitals:   07/27/24 1629 07/27/24 2157 07/28/24 0516 07/28/24 0759  BP: 125/83 107/69 103/61 107/74  Pulse: 66 (!) 58 (!) 57 63  Resp: 17 17 17 17   Temp: 97.8 F (36.6 C) 97.8 F (36.6 C) 98 F (36.7 C) 97.7 F (36.5 C)  TempSrc:  Oral Oral   SpO2: 99% 99% 99% 99%  Weight:      Height:         Author: Yetta Blanch, MD 07/28/2024 6:06 PM  Please look on [email or web address] to find out who is on call.

## 2024-07-29 ENCOUNTER — Inpatient Hospital Stay (HOSPITAL_COMMUNITY)

## 2024-07-29 DIAGNOSIS — K56609 Unspecified intestinal obstruction, unspecified as to partial versus complete obstruction: Secondary | ICD-10-CM | POA: Diagnosis not present

## 2024-07-29 LAB — CBC
HCT: 36.5 % (ref 36.0–46.0)
Hemoglobin: 12.3 g/dL (ref 12.0–15.0)
MCH: 31.2 pg (ref 26.0–34.0)
MCHC: 33.7 g/dL (ref 30.0–36.0)
MCV: 92.6 fL (ref 80.0–100.0)
Platelets: 225 K/uL (ref 150–400)
RBC: 3.94 MIL/uL (ref 3.87–5.11)
RDW: 12.4 % (ref 11.5–15.5)
WBC: 6.9 K/uL (ref 4.0–10.5)
nRBC: 0 % (ref 0.0–0.2)

## 2024-07-29 LAB — BASIC METABOLIC PANEL WITH GFR
Anion gap: 8 (ref 5–15)
BUN: 10 mg/dL (ref 6–20)
CO2: 22 mmol/L (ref 22–32)
Calcium: 9.4 mg/dL (ref 8.9–10.3)
Chloride: 111 mmol/L (ref 98–111)
Creatinine, Ser: 0.83 mg/dL (ref 0.44–1.00)
GFR, Estimated: >60 mL/min (ref >60)
Glucose, Bld: 89 mg/dL (ref 70–99)
Potassium: 3.5 mmol/L (ref 3.5–5.1)
Sodium: 141 mmol/L (ref 135–145)

## 2024-07-29 LAB — MAGNESIUM: Magnesium: 1.8 mg/dL (ref 1.7–2.4)

## 2024-07-29 LAB — PHOSPHORUS: Phosphorus: 4.5 mg/dL (ref 2.5–4.6)

## 2024-07-29 MED ORDER — CETIRIZINE HCL 10 MG PO TABS
10.0000 mg | ORAL_TABLET | Freq: Every day | ORAL | Status: DC
  Administered 2024-07-29: 10 mg via ORAL
  Filled 2024-07-29 (×2): qty 1

## 2024-07-29 MED ORDER — POTASSIUM CHLORIDE 10 MEQ/100ML IV SOLN
10.0000 meq | INTRAVENOUS | Status: AC
  Administered 2024-07-29 (×6): 10 meq via INTRAVENOUS
  Filled 2024-07-29 (×6): qty 100

## 2024-07-29 MED ORDER — ONDANSETRON HCL 4 MG/2ML IJ SOLN
4.0000 mg | Freq: Four times a day (QID) | INTRAMUSCULAR | Status: DC | PRN
  Administered 2024-07-29 – 2024-07-30 (×2): 4 mg via INTRAVENOUS
  Filled 2024-07-29 (×2): qty 2

## 2024-07-29 MED ORDER — MAGNESIUM SULFATE 2 GM/50ML IV SOLN
2.0000 g | Freq: Once | INTRAVENOUS | Status: AC
  Administered 2024-07-29: 2 g via INTRAVENOUS
  Filled 2024-07-29: qty 50

## 2024-07-29 MED ORDER — METOCLOPRAMIDE HCL 5 MG/ML IJ SOLN: 5.0000 mg | Freq: Four times a day (QID) | INTRAMUSCULAR | Status: DC | PRN

## 2024-07-29 MED ORDER — KETOROLAC TROMETHAMINE 30 MG/ML IJ SOLN
30.0000 mg | Freq: Four times a day (QID) | INTRAMUSCULAR | Status: DC | PRN
  Administered 2024-07-29: 30 mg via INTRAVENOUS
  Filled 2024-07-29: qty 1

## 2024-07-29 MED ORDER — HYDROMORPHONE HCL 1 MG/ML IJ SOLN
0.5000 mg | INTRAMUSCULAR | Status: DC | PRN
  Administered 2024-07-29: 1 mg via INTRAVENOUS
  Filled 2024-07-29: qty 1

## 2024-07-29 MED ORDER — LEVOCETIRIZINE DIHYDROCHLORIDE 5 MG PO TABS: 5.0000 mg | ORAL_TABLET | Freq: Every day | ORAL | Status: DC

## 2024-07-29 NOTE — Progress Notes (Signed)
 Removed NG tube per order. Pt tolerated well.   Amado GORMAN Arabia, RN

## 2024-07-29 NOTE — Plan of Care (Signed)
  Problem: Education: Goal: Knowledge of General Education information will improve Description: Including pain rating scale, medication(s)/side effects and non-pharmacologic comfort measures Outcome: Progressing   Problem: Activity: Goal: Risk for activity intolerance will decrease Outcome: Progressing   Problem: Coping: Goal: Level of anxiety will decrease Outcome: Progressing   Problem: Elimination: Goal: Will not experience complications related to bowel motility Outcome: Progressing   Problem: Pain Managment: Goal: General experience of comfort will improve and/or be controlled Outcome: Progressing   Problem: Safety: Goal: Ability to remain free from injury will improve Outcome: Progressing

## 2024-07-29 NOTE — Progress Notes (Signed)
 [facility] Progress Note Patient: [name] Sanchez FMW:[id number] DOB: Aug 21, 1996  DOA: 07/26/2024 DOS: the patient was seen and examined on 07/29/2024  Brief Hospital Course: PMH of migraine, hysterectomy, fibromyalgia, depression presented to hospital with complaints of worsening abdominal pain. Patient reports that she has multiple GI issues and follows up with GI outpatient. Was evaluated for gastroparesis earlier this year which was negative. 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 Does not take any medication other than magnesium  on a regular basis. Now present to the hospital with complaints of worsening abdominal pain nausea and vomiting. CT scan shows evidence of small bowel obstruction. General surgery consulted  Assessment and Plan: Partial SBO. Adhesions from prior surgery versus constipation with IBS-C. Appreciate general surgery consultation. NG tube inserted. Small bowel protocol initiated. X-ray reassuring Clamping trial successful for NG tube and therefore patient's diet is advanced to full liquid diet NG tube is now removed on 8/25. Hold off on IV fluid. Minimize narcotics. May need Reglan .  History of anxiety, mood disorder as well as chronic pain secondary to fibromyalgia Resume home medication.  Migraine. Continue home Topamax . Continue Toradol .  Hyperchloremic metabolic acidosis. Monitor.  Obesity. Body mass index is 39.14 kg/m.  Placing the patient at a high risk for poor outcome.   Subjective: No nausea no vomiting.  Passing gas.  Had a BM earlier this morning.  Physical Exam: Clear to auscultation. No edema. Alert and oriented  Data Reviewed: I have Reviewed nursing notes, Vitals, and Lab results. Since last encounter, pertinent lab results CBC and BMP   . I have ordered test including CBC and BMP  .  Discussed with surgery Disposition: Status is: Inpatient Remains inpatient appropriate because: Monitor for diet tolerance  Family  Communication: No one at bedside Level of care: Med-Surg   Vitals:   07/28/24 2048 07/29/24 0501 07/29/24 0744 07/29/24 1543  BP: 130/81 115/70 117/73 135/78  Pulse: 61 69 71 (!) 101  Resp: 18 16 16 16   Temp: 98.3 F (36.8 C) 98.1 F (36.7 C) 98.1 F (36.7 C) 98.5 F (36.9 C)  TempSrc: Oral Oral Oral Oral  SpO2: 100% 99% 99% 99%  Weight:      Height:         Author: Yetta Blanch, MD 07/29/2024 5:18 PM  Please look on [email or web address] to find out who is on call.

## 2024-07-29 NOTE — Progress Notes (Addendum)
 Subjective: CC: Had some lower abdominal cramping and nausea earlier that has resolved currently. Didn't feel like nausea was related to her migraine. Abdominal distension improved. Passing flatus. BM yesterday.   Afebrile. No tachycardia or hypotension. WBC wnl.   Objective: Vital signs in last 24 hours: Temp:  [97.7 F (36.5 C)-98.3 F (36.8 C)] 98.1 F (36.7 C) (08/25 0744) Pulse Rate:  [61-71] 71 (08/25 0744) Resp:  [16-18] 16 (08/25 0744) BP: (115-130)/(70-81) 117/73 (08/25 0744) SpO2:  [99 %-100 %] 99 % (08/25 0744) Last BM Date : 07/28/24  Intake/Output from previous day: 08/24 0701 - 08/25 0700 In: 150 [P.O.:100; IV Piggyback:50] Out: 250 [Emesis/NG output:250] Intake/Output this shift: No intake/output data recorded.  PE: Gen:  Alert, NAD, pleasant Abd: Soft, improved mild distension, improved lower abdominal ttp without rigidity or guarding. No obvious palpable masses or hernias.   Lab Results:  Recent Labs    07/28/24 0609 07/29/24 0657  WBC 6.2 6.9  HGB 11.9* 12.3  HCT 35.9* 36.5  PLT 243 225   BMET Recent Labs    07/28/24 0609 07/29/24 0657  NA 139 141  K 3.9 3.5  CL 114* 111  CO2 19* 22  GLUCOSE 95 89  BUN 5* 10  CREATININE 0.82 0.83  CALCIUM 8.8* 9.4   PT/INR No results for input(s): LABPROT, INR in the last 72 hours. CMP     Component Value Date/Time   NA 141 07/29/2024 0657   NA 139 05/19/2021 1408   K 3.5 07/29/2024 0657   CL 111 07/29/2024 0657   CO2 22 07/29/2024 0657   GLUCOSE 89 07/29/2024 0657   BUN 10 07/29/2024 0657   BUN 11 05/19/2021 1408   CREATININE 0.83 07/29/2024 0657   CALCIUM 9.4 07/29/2024 0657   PROT 7.3 07/26/2024 1351   PROT 7.0 05/19/2021 1408   ALBUMIN 4.4 07/26/2024 1351   ALBUMIN 4.6 05/19/2021 1408   AST 18 07/26/2024 1351   ALT 18 07/26/2024 1351   ALKPHOS 64 07/26/2024 1351   BILITOT 0.3 07/26/2024 1351   BILITOT 0.2 05/19/2021 1408   GFRNONAA >60 07/29/2024 0657   GFRAA 117  10/20/2020 1027   Lipase     Component Value Date/Time   LIPASE 31 07/26/2024 1351    Studies/Results: DG Abd Portable 1V-Small Bowel Obstruction Protocol-initial, 8 hr delay Result Date: 07/27/2024 CLINICAL DATA:  Small bowel obstruction protocol. 8 hour delayed film EXAM: PORTABLE ABDOMEN - 1 VIEW COMPARISON:  07/27/2024 FINDINGS: NG tube tip is in the distal stomach. No gaseous small bowel dilatation to suggest obstruction. Contrast material is seen along the entire length of a nondilated colon. Contrast material in the bladder is consistent with excretion of intravenous contrast material from yesterday's CT scan. IMPRESSION: No gaseous small bowel dilatation to suggest obstruction. Contrast material is seen along the entire length of a nondilated colon compatible with transit through the small bowel. Electronically Signed   By: Camellia Candle M.D.   On: 07/27/2024 12:03    Anti-infectives: Anti-infectives (From admission, onward)    None        Assessment/Plan SBO - No prior abdominal surgeries. Reports chronic upper abdominal pain and nausea that she follows with GI for.  - HDS without fever, tachycardia or hypotension. No peritonitis on exam. WBC wnl. No current indication for emergency surgery - Contrast in colon on delayed film. Now having bowel function - Clamp NGT. CLD. Possible removal of NGT later today. Discussed plan with RN.  FEN - Clamp NGT, CLD, IVF per primary  VTE - SCDs, Lovenox  ID - None   I reviewed nursing notes, last 24 h vitals and pain scores, last 48 h intake and output, last 24 h labs and trends, and last 24 h imaging results   LOS: 2 days    Ozell CHRISTELLA Shaper, [facility] Surgery 07/29/2024, 8:33 AM Please see Amion for pager number during day hours 7:00am-4:30pm

## 2024-07-30 DIAGNOSIS — K56609 Unspecified intestinal obstruction, unspecified as to partial versus complete obstruction: Secondary | ICD-10-CM | POA: Diagnosis not present

## 2024-07-30 LAB — BASIC METABOLIC PANEL WITH GFR
Anion gap: 9 (ref 5–15)
BUN: 8 mg/dL (ref 6–20)
CO2: 20 mmol/L — ABNORMAL LOW (ref 22–32)
Calcium: 9 mg/dL (ref 8.9–10.3)
Chloride: 109 mmol/L (ref 98–111)
Creatinine, Ser: 0.86 mg/dL (ref 0.44–1.00)
GFR, Estimated: >60 mL/min (ref >60)
Glucose, Bld: 91 mg/dL (ref 70–99)
Potassium: 3.8 mmol/L (ref 3.5–5.1)
Sodium: 138 mmol/L (ref 135–145)

## 2024-07-30 LAB — CBC
HCT: 35.3 % — ABNORMAL LOW (ref 36.0–46.0)
Hemoglobin: 11.9 g/dL — ABNORMAL LOW (ref 12.0–15.0)
MCH: 31.2 pg (ref 26.0–34.0)
MCHC: 33.7 g/dL (ref 30.0–36.0)
MCV: 92.7 fL (ref 80.0–100.0)
Platelets: 240 K/uL (ref 150–400)
RBC: 3.81 MIL/uL — ABNORMAL LOW (ref 3.87–5.11)
RDW: 12.2 % (ref 11.5–15.5)
WBC: 6.7 K/uL (ref 4.0–10.5)
nRBC: 0 % (ref 0.0–0.2)

## 2024-07-30 LAB — MAGNESIUM: Magnesium: 1.9 mg/dL (ref 1.7–2.4)

## 2024-07-30 MED ORDER — ONDANSETRON 4 MG PO TBDP: ORAL_TABLET | ORAL | 0 refills | Status: AC

## 2024-07-30 MED ORDER — POLYETHYLENE GLYCOL 3350 17 G PO PACK: 17.0000 g | PACK | Freq: Every day | ORAL | 0 refills | Status: AC | PRN

## 2024-07-30 MED ORDER — DULOXETINE HCL 40 MG PO CPEP: 40.0000 mg | ORAL_CAPSULE | Freq: Every day | ORAL | 0 refills | Status: DC

## 2024-07-30 MED ORDER — ONDANSETRON 4 MG PO TBDP: 4.0000 mg | ORAL_TABLET | Freq: Three times a day (TID) | ORAL | 0 refills | Status: DC | PRN

## 2024-07-30 MED ORDER — DULOXETINE HCL 20 MG PO CPEP: 40.0000 mg | ORAL_CAPSULE | Freq: Every day | ORAL | Status: DC

## 2024-07-30 MED ORDER — SIMETHICONE 80 MG PO CHEW
80.0000 mg | CHEWABLE_TABLET | Freq: Four times a day (QID) | ORAL | Status: DC
  Administered 2024-07-30: 80 mg via ORAL
  Filled 2024-07-30: qty 1

## 2024-07-30 MED ORDER — ACETAMINOPHEN 500 MG PO TABS
1000.0000 mg | ORAL_TABLET | Freq: Four times a day (QID) | ORAL | Status: DC | PRN
  Administered 2024-07-30: 1000 mg via ORAL
  Filled 2024-07-30: qty 2

## 2024-07-30 MED ORDER — SIMETHICONE 80 MG PO CHEW: 80.0000 mg | CHEWABLE_TABLET | Freq: Four times a day (QID) | ORAL | 0 refills | Status: DC

## 2024-07-30 NOTE — Progress Notes (Signed)
 Patient is sleeping. Has had no new complaints of pain or nausea since receiving pain meds last night.

## 2024-07-30 NOTE — Progress Notes (Signed)
   07/30/24 1026  TOC Brief Assessment  Insurance and Status Reviewed  Patient has primary care physician Yes  Home environment has been reviewed spouse  Prior level of function: independent  Prior/Current Home Services No current home services  Social Drivers of Health Review SDOH reviewed no interventions necessary  Readmission risk has been reviewed No  Transition of care needs no transition of care needs at this time      Transition of Care Department [facility]) has reviewed patient and no TOC needs have been identified at this time. We will continue to monitor patient advancement through interdisciplinary progression rounds. If new patient transition needs arise, please place a TOC consult.

## 2024-07-30 NOTE — Progress Notes (Addendum)
 Subjective: CC: NGT out. Advanced to FLD yesterday.  Reports some lower abdominal cramping and nausea yesterday. There is report of vomiting in RN notes, patient denies this.  Abdominal pain has resolved this morning and nausea has improved. She reports she had FLD for dinner last night. Several loose BM's yesterday. She has not received any PRN anti-nausea medications or PRN pain medications since midnight.   Afebrile. Tachycardic to 101 yesterday - resolved this AM. Soft BP of 95/58 this AM - RN to repeat. WBC wnl. Hgb stable. Cr wnl.  K and Mg wnl.   Objective: Vital signs in last 24 hours: Temp:  [97.9 F (36.6 C)-98.5 F (36.9 C)] 98.3 F (36.8 C) (08/26 0759) Pulse Rate:  [61-101] 68 (08/26 0759) Resp:  [16-18] 18 (08/26 0759) BP: (93-135)/(54-78) 95/58 (08/26 0759) SpO2:  [99 %-100 %] 99 % (08/26 0759) Last BM Date : 07/29/24  Intake/Output from previous day: 08/25 0701 - 08/26 0700 In: 670 [P.O.:120; IV Piggyback:550] Out: -  Intake/Output this shift: No intake/output data recorded.  PE: Gen:  Alert, NAD, pleasant Abd: Soft, improved mild distension, improved lower abdominal ttp without rigidity or guarding. No obvious palpable masses or hernias.   Lab Results:  Recent Labs    07/29/24 0657 07/30/24 0330  WBC 6.9 6.7  HGB 12.3 11.9*  HCT 36.5 35.3*  PLT 225 240   BMET Recent Labs    07/29/24 0657 07/30/24 0330  NA 141 138  K 3.5 3.8  CL 111 109  CO2 22 20*  GLUCOSE 89 91  BUN 10 8  CREATININE 0.83 0.86  CALCIUM 9.4 9.0   PT/INR No results for input(s): LABPROT, INR in the last 72 hours. CMP     Component Value Date/Time   NA 138 07/30/2024 0330   NA 139 05/19/2021 1408   K 3.8 07/30/2024 0330   CL 109 07/30/2024 0330   CO2 20 (L) 07/30/2024 0330   GLUCOSE 91 07/30/2024 0330   BUN 8 07/30/2024 0330   BUN 11 05/19/2021 1408   CREATININE 0.86 07/30/2024 0330   CALCIUM 9.0 07/30/2024 0330   PROT 7.3 07/26/2024 1351   PROT 7.0  05/19/2021 1408   ALBUMIN 4.4 07/26/2024 1351   ALBUMIN 4.6 05/19/2021 1408   AST 18 07/26/2024 1351   ALT 18 07/26/2024 1351   ALKPHOS 64 07/26/2024 1351   BILITOT 0.3 07/26/2024 1351   BILITOT 0.2 05/19/2021 1408   GFRNONAA >60 07/30/2024 0330   GFRAA 117 10/20/2020 1027   Lipase     Component Value Date/Time   LIPASE 31 07/26/2024 1351    Studies/Results: DG Abd 1 View Result Date: 07/29/2024 CLINICAL DATA:  Small bowel obstruction. EXAM: ABDOMEN - 1 VIEW COMPARISON:  07/27/2024. FINDINGS: Oral contrast has advanced within the colon, now within the transverse, descending and rectosigmoid colon. No small bowel dilatation. Nasogastric tube terminates near the pylorus. IMPRESSION: Continued transit of oral contrast within the colon. No small bowel obstruction. Electronically Signed   By: Newell Eke M.D.   On: 07/29/2024 11:54    Anti-infectives: Anti-infectives (From admission, onward)    None        Assessment/Plan SBO - No prior abdominal surgeries. Reports chronic upper abdominal pain and nausea that she follows with GI for.  - No current indication for emergency surgery - Contrast in colon on delayed film. Now having bowel function - Adv to soft diet. If tolerates diet advancement without worsening abdominal pain, abdominal  distension, nausea or any vomiting she is okay for discharge from our standpoint.   FEN - Soft, IVF per primary  VTE - SCDs, Lovenox  ID - None   I reviewed nursing notes, last 24 h vitals and pain scores, last 48 h intake and output, last 24 h labs and trends, and last 24 h imaging results   LOS: 3 days    Ozell CHRISTELLA Shaper, [facility] Surgery 07/30/2024, 8:05 AM Please see Amion for pager number during day hours 7:00am-4:30pm

## 2024-07-30 NOTE — Progress Notes (Signed)
 Reviewed AVS, patient expressed understanding of medications, MD follow up reviewed.   Removed IV, Site clean, dry and intact.    Patient states all belongings brought to the hospital at time of admission are accounted for and packed to take home.  Patient informed and expressed understanding where to pick up discharge medications.    patient transported to entrance A where family member was waiting in vehicle to transport home.

## 2024-07-30 NOTE — Progress Notes (Signed)
 Patient states that she is again having some pain in her lower abdomen. No nausea, no vomiting, no fever. States that she would like to try the dilaudid  at this time. Pain score is 8

## 2024-07-30 NOTE — Plan of Care (Signed)
  Problem: Activity: Goal: Risk for activity intolerance will decrease Outcome: Progressing   Problem: Nutrition: Goal: Adequate nutrition will be maintained Outcome: Progressing   Problem: Coping: Goal: Level of anxiety will decrease Outcome: Progressing   Problem: Pain Managment: Goal: General experience of comfort will improve and/or be controlled Outcome: Progressing

## 2024-07-30 NOTE — Progress Notes (Signed)
 Patient complains of some nausea and LLQ pain. Has had one watery, mushy BM while this nurse was in the room. Bowel sounds are audible in all four quads. Giving zofran  at this time for nausea. States pain is a 7 but does not want to take dilaudid  at this time. Requested Zanaflex  tablet instead.

## 2024-07-30 NOTE — Progress Notes (Signed)
 Patient states that she has gotten relief from the zofran . No longer nauseated but does not want to finish her meal. Stated that she did throw up one time earlier, but feels better now. Pain is a little better ... Will reassess

## 2024-07-31 NOTE — ED Provider Notes (Signed)
 Patient placed in First Look pathway, seen and evaluated for chief complaint of abdominal pain.  Pt recently diagnosed with SBO, admitted to [facility], no surgery, discharged yesterday.  Pt has again developed lower abdominal pain, no BM, pt is not passing gas.  Pertinent exam findings include appears uncomfortable.   Patient/parent counseled on process, plan, and necessity for staying for completing the evaluation.      Note By: Peyton Gammons, PA-C 5:47 PM   [facility] Emergency Department Emergency Department Provider Note  This document was created using the aid of voice recognition Dragon dictation software.   Provider at bedside: 08/01/2024 6:05 PM  History obtained from the: Patient  History   Chief Complaint  Patient presents with  . Abdominal Pain    HPI  [name] Sanchez is a 28 y.o. female who presents to the ED with complaints of abdominal pain.  Patient states she noted acute onset left sided abdominal pain around 6:30 PM today.  States pain caused her to double over after taking a shower.  She did take Pepcid with mild relief in pain.  When she awoke today she noted consistent pain, nausea and decreased appetite.  Of note patient was recently admitted at outside hospital from 8/22 through 826 for small bowel obstruction.  During admission she had NG tube placed and eventually was trialed off NG tube and discharged home.  No surgical intervention was necessitated.  Patient states since being discharged from the hospital she has not had a bowel movement.  Has taken 1 capful of MiraLAX  with no relief.  She denies any fever chills or dysuria.  6:05 PM Previous medical records reviewed from [facility] Care Everywhere and EPIC Chart Review.   No LMP recorded. Patient has had a hysterectomy.   ROS: Pertinent positives and negatives per HPI. Pertinent past medical, surgical, social and family history records were reviewed. Current Medications and Allergies were  reviewed.  Physical Exam   Vitals:   07/31/24 1744 07/31/24 1850 07/31/24 1900  BP: 130/87 118/81 118/79  BP Location: Right arm    Patient Position: Sitting    Pulse: 101 90 91  Resp: 16    Temp: 97.9 F (36.6 C)    TempSrc: Oral    SpO2: 100% 100% 100%  Weight: 103 kg (227 lb)    Height: 162.6 cm (5' 4)      Physical Exam Vitals and nursing note reviewed.  Constitutional:      General: She is not in acute distress. HENT:     Head: Normocephalic and atraumatic.     Mouth/Throat:     Mouth: Mucous membranes are moist.  Cardiovascular:     Rate and Rhythm: Normal rate and regular rhythm.     Heart sounds: Normal heart sounds.  Pulmonary:     Effort: Pulmonary effort is normal.  Abdominal:     General: Abdomen is flat. Bowel sounds are decreased.     Palpations: Abdomen is soft.     Tenderness: There is generalized abdominal tenderness.  Musculoskeletal:     Cervical back: Normal range of motion.  Skin:    General: Skin is warm.     Capillary Refill: Capillary refill takes less than 2 seconds.  Neurological:     Mental Status: She is alert.  Psychiatric:        Behavior: Behavior normal.     Results  LABS Labs Reviewed  LACTIC ACID WITH 2 HOUR REFLEX - Normal  Result Value   Lactic Acid 1.1    LIPASE - Normal   Lipase 29    URINALYSIS WITH REFLEX TO MICROSCOPIC - Normal   Color, Urine Colorless     Clarity, Urine Clear     Specific Gravity, Urine 1.005     pH, Urine 7.0     Protein, Urine Negative     Glucose, Urine Negative     Ketones, Urine Negative     Bilirubin, Urine Negative     Blood, Urine Negative     Nitrite, Urine Negative     Leukocyte Esterase, Urine Negative     Urobilinogen, Urine Normal     Narrative:    Microscopic not indicated  BLOOD CULTURE,1ST SET  BLOOD CULTURE, 2ND SET  COMPREHENSIVE METABOLIC PANEL   Sodium 137     Potassium 3.5     Chloride 106     CO2 22     Anion Gap 9     Glucose, Random 91     Blood Urea  Nitrogen (BUN) 12     Creatinine 0.72     eGFR >90     Albumin 4.4     Total Protein 7.6     Bilirubin, Total 0.6     Alkaline Phosphatase (ALP) 57     Aspartate Aminotransferase (AST) 13     Alanine Aminotransferase (ALT) 13     Calcium 9.8     BUN/Creatinine Ratio      CBC WITH DIFFERENTIAL   WBC 7.07     RBC 4.17     Hemoglobin 13.0     Hematocrit 37.8     Mean Corpuscular Volume (MCV) 90.6     Mean Corpuscular Hemoglobin (MCH) 31.1     Mean Corpuscular Hemoglobin Conc (MCHC) 34.4     Red Cell Distribution Width (RDW) 12.9     Platelet Count (PLT) 227     Mean Platelet Volume (MPV) 9.3     Neutrophils % 68     Lymphocytes % 23     Monocytes % 8     Eosinophils % 2     Basophils % 0     Neutrophils Absolute 4.80     Lymphocytes # 1.60     Monocytes # 0.50     Eosinophils # 0.10     Basophils # 0.00      Radiology   Radiology Results (last 72 hours)     Procedure Component Value Units Date/Time   CT Abdomen Pelvis W Contrast [[id number]] Collected: 07/31/24 1956   Order Status: Completed Updated: 07/31/24 2003   Narrative:     CLINICAL DATA:  Lower abdominal pain. Recent admission for small bowel obstruction.  EXAM: CT ABDOMEN AND PELVIS WITH CONTRAST  TECHNIQUE: Multidetector CT imaging of the abdomen and pelvis was performed using the standard protocol following bolus administration of intravenous contrast.  RADIATION DOSE REDUCTION: This exam was performed according to the departmental dose-optimization program which includes automated exposure control, adjustment of the mA and/or kV according to patient size and/or use of iterative reconstruction technique.  CONTRAST:  100 mL iohexoL  (OMNIPAQUE ) 350 mg iodine /mL injection (MDV) 100 mL  COMPARISON:  CT abdomen pelvis dated 07/26/2024.  FINDINGS: Lower chest: The visualized lung bases are clear.  No intra-abdominal free air or free fluid.  Hepatobiliary: The liver is unremarkable. No biliary  ductal dilatation. The gallbladder is unremarkable.  Pancreas: Unremarkable. No pancreatic ductal dilatation or surrounding inflammatory changes.  Spleen: Normal in size  without focal abnormality.  Adrenals/Urinary Tract: The adrenal glands are unremarkable. The kidneys, visualized ureters, and urinary bladder appear unremarkable.  Stomach/Bowel: There is no bowel obstruction or active inflammation. The appendix is normal.  Vascular/Lymphatic: The abdominal aorta and IVC are unremarkable. No portal venous gas. There is no adenopathy.  Reproductive: Hysterectomy.  No suspicious adnexal masses.  Other: None  Musculoskeletal: No acute or significant osseous findings.    Impression:     No acute intra-abdominal or pelvic pathology. No bowel obstruction. Normal appendix.   Electronically Signed   By: Vanetta Chou M.D.   On: 07/31/2024 20:00                       Medications Given in Emergency Department   Medications  sodium chloride  (bolus) 0.9 % bolus 1,000 mL (0 mL intravenous Stopped 07/31/24 2216)  fentaNYL  (SUBLIMAZE ) injection 25 mcg (25 mcg intravenous Given 07/31/24 1918)  iohexoL  (OMNIPAQUE ) 350 mg iodine /mL injection (MDV) 100 mL (100 mL intravenous Given 07/31/24 1949)  diazePAM (VALIUM) injection 5 mg (5 mg intravenous Given 07/31/24 2051)     Medical Decision Making  Medical Decision Making Problems Addressed: Constipation, unspecified constipation type: complicated acute illness or injury Decreased appetite: complicated acute illness or injury Generalized abdominal pain: complicated acute illness or injury with systemic symptoms Nausea: complicated acute illness or injury  Amount and/or Complexity of Data Reviewed External Data Reviewed: notes. Labs: ordered. Decision-making details documented in ED Course. Radiology: ordered.  Risk OTC drugs. Prescription drug management.      Additional Information: I reviewed the patient's past  medical history, including notes from our facility and what is available in CareEverywhere.  Differentials considered: DDx: appendicitis, bowel obstruction, AAA, UTI, pyelonephritis, nephrolithiasis, pancreatitis, cholecystitis, shingles, perforated bowel or ulcer, diverticulitis, mesenteric ischemia, inflammatory bowel disease, or strangulated/incarcerated hernia.  On my initial exam, the pt was hemodynamically stable, nontoxic-appearing and in obvious discomfort.  Physical examination as described above revealed generalized abdominal tenderness with left being worse than right.  No peritoneal signs.  No CVA tenderness.  Upon workup today CBC without leukocytosis, anemia or thrombocytopenia.  CMP without significant electrolyte derangements, renal hepatic function within normal limits.  Urine analysis without signs of infection.  Lipase is not elevated.  Abdominal CT obtained and negative for any acute intra-abdominal abnormalities including SBO.  Patient has not had a bowel movement in several days suspect constipation may play a role.  Discussed continuing with outpatient bowel regimen versus trying enema.  Patient would like to proceed with enema she did note some anxiety and related to enema thus was pretreated with anxiolytics.  Did have successful bowel movement following enema.  Stable for discharge at this home with instructions to follow-up closely with GI/PCP.SABRA    The following decision tools were used to help assess clinical picture and make decisions: pts history, exam, labs,imaging   Patient hooked up to cardiac telemetry for close monitoring, telemetry interpretation: Sinus rhythm  ED Clinical Impression   Clinical Complexity Number and complexity of problems addressed: Patient's presentation is most consistent with acute complicated illness / injury requiring diagnostic workup.  Amount and/or complexity of data reviewed, analyzed: Assessment requiring independent historians  used: Spouse at bedside  External notes reviewed: Prior PCP visit, prior emergency department visits from outside hospital, prior subspecialist consultation visit, prior hospitalization  Lab results personally reviewed by me and considered in medical decision making of this patient's treatment and disposition, and pertinent information noted in ED  course  Radiology imaging reviewed and interpreted personally by me and pertinent information noted in ED Course  All radiology studies reviewed independently, additionally reading provided by radiologist available, unless otherwise noted.   Plan: Clinical picture was discussed with patient along with risks and benefits of management options, shared decision making we will discharge the patient home with close outpatient follow-up for continued management. Based on the above findings, I believe patient is hemodynamically stable for discharge.   The following prescriptions were given for continued management of symptoms or pathologies:none  Patient/and family educated about specific return precautions for given chief complaint and symptoms.  Patient/and family educated about follow-up with PCP and GI.  Patient/and family expressed understanding of return precautions and need for follow-up.  Patient discharged. Diagnosis, treatment, plan discussed with patient.  All questions were answered to the patient's satisfaction.   If patient was given medication(s), adverse effects, black box warnings, and drug interactions were discussed with patient.  Patient instructed to follow-up here in the emergency department for new or worsening symptoms.     1. Generalized abdominal pain   2. Nausea   3. Decreased appetite   4. Constipation, unspecified constipation type      Medication List     ASK your doctor about these medications    Ajovy Autoinjector 225 mg/1.5 mL Atin injection Generic drug: fremanezumab-vfrm Inject the contents of 1 pen into the  skin every 28 days (Inject 1.5 mL (225 mg total) under the skin every 28 days.)   ALPRAZolam  0.5 mg tablet Commonly known as: XANAX  TAKE 1 TABLET BY MOUTH DAILY AS NEEDED FOR SLEEP OR ANXIETY.   CYANOCOBALAMIN (VITAMIN B-12) ORAL Take 1 tablet by mouth daily.   divalproex 500 mg 12 hr tablet Commonly known as: DEPAKOTE DR 500 mg BID for 3-5 days to break a headache cycle   DULoxetine  30 mg capsule Commonly known as: CYMBALTA  Take 1 capsule (30 mg total) by mouth daily. X one week, then two daily.   EPINEPHrine  0.3 mg/0.3 mL injection syringe Commonly known as: EPIPEN  Inject 0.3 mg into the muscle once as needed for anaphylaxis for up to 1 dose.   gabapentin  300 mg capsule Commonly known as: NEURONTIN  TAKE 1 CAPSULE BY MOUTH 2 TIMES DAILY.   ketorolac  60 mg/2 mL Soln Commonly known as: TORADOL  Inject 2 mLs (60 mg total) into the muscle as needed (for severe headache rescue). Limit use to a maximum of 5 days per month. (Inject 2 mLs (60 mg total) into the muscle as needed (for severe headache rescue).)   levocetirizine 5 mg tablet Commonly known as: XYZAL  Take 5 mg by mouth every evening.   MAGNESIUM  OXIDE ORAL Take 500 mg by mouth nightly.   ondansetron  4 mg disintegrating tablet Commonly known as: ZOFRAN -ODT Dissolve 4 mg on tongue every 8 (eight) hours as needed for nausea or vomiting.   pantoprazole  40 mg EC tablet Commonly known as: PROTONIX  TAKE 1 TABLET (40 MG TOTAL) BY MOUTH DAILY.   promethazine  25 mg tablet Commonly known as: PHENERGAN  Take 25 mg by mouth every 6 (six) hours as needed for nausea or vomiting.   tiZANidine  4 mg tablet Commonly known as: ZANAFLEX  TAKE 1 TABLET BY MOUTH EVERY 6 HOURS AS NEEDED FOR MUSCLE SPASMS (HEADACHE)   topiramate  100 mg tablet Commonly known as: TOPAMAX  TAKE 1 TABLET BY MOUTH 2 TIMES A DAY.   traZODone 50 mg tablet Commonly known as: DESYREL Take 1 tablet (50 mg total) by mouth nightly  as needed for sleep.    ubrogepant  100 mg tablet Commonly known as: UBRELVY  Take 100mg  at onset of headache, repeat 2hr later if headache persists. Do not exceed 200mg  in 24hr.   UltiCare Low Dead Space Syring 3 mL 22 x 1 1/2 Syrg Generic drug: syringe with needle Use to inject ketorolac . (Use to inject ketorolac .)   VITAMIN D3 ORAL Take 1 tablet by mouth daily.       FOLLOW UP Santana Tarry Molt, FNP [address] [place] [address] [phone]  Schedule an appointment as soon as possible for a visit    [facility] -  EMERGENCY DEPARTMENT [address] [employer] [place]  [address] [phone] Go to  If symptoms worsen, As needed  _____________________________  Attestation: Charleen Fox, PA-C obtained and performed the history, physical exam and medical decision making elements that were entered into the chart.  08/01/2024  1:18 AM

## 2024-07-31 NOTE — Discharge Summary (Signed)
 Physician Discharge Summary   Patient: [name] Sanchez MRN: [id number] DOB: [birth year]  Admit date:     07/26/2024  Discharge date: 07/30/2024  Discharge Physician: Yetta Blanch  PCP: Joshua Santana CROME, NP  Recommendations at discharge:  Follow up with PCP in 1 week   Follow-up Information     Joshua Santana CROME, NP. Schedule an appointment as soon as possible for a visit in 1 week(s).   Specialty: Nurse Practitioner Contact information: [address] [place] [address] [phone]                Discharge Diagnoses: Principal Problem:   SBO (small bowel obstruction) (HCC) Active Problems:   Depression with anxiety   Migraine without aura and without status migrainosus, not intractable   Status post hysterectomy   Obesity (BMI 30-39.9)  Hospital Course: PMH of migraine, hysterectomy, fibromyalgia, depression presented to hospital with complaints of worsening abdominal pain. Patient reports that she has multiple GI issues and follows up with GI outpatient. Was evaluated for gastroparesis earlier this year which was negative. 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 Does not take any medication other than magnesium  on a regular basis. Now present to the hospital with complaints of worsening abdominal pain nausea and vomiting. CT scan shows evidence of small bowel obstruction. General surgery consulted  Assessment and Plan: Partial SBO. Adhesions from prior surgery versus constipation with IBS-C. Appreciate general surgery consultation. NG tube inserted. Small bowel protocol initiated. X-ray reassuring Able to tolerate oral diet.  Provide scheduled zofran   Follow up with GI to consider IBS C  History of anxiety, mood disorder as well as chronic pain secondary to fibromyalgia Resume home medication.  Migraine. Continue home Topamax . Continue Toradol .  Hyperchloremic metabolic acidosis. Monitor.  Obesity. Body mass index is 39.14 kg/m.  Placing  the patient at a high risk for poor outcome.  Consultants:  General surgery   Procedures performed:  none  DISCHARGE MEDICATION: Allergies as of 07/30/2024       Reactions   Compazine  [prochlorperazine ] Itching   Nurtec [rimegepant Sulfate ] Other (See Comments)   GI intolerance    Percocet [oxycodone -acetaminophen ] Hives, Itching   Ceftin [cefuroxime] Rash   Penicillins Rash        Medication List     TAKE these medications    acetaminophen  500 MG tablet Commonly known as: TYLENOL  Take 1,000 mg by mouth 2 (two) times daily as needed for moderate pain (pain score 4-6), fever or headache.   Ajovy 225 MG/1.5ML Soaj Generic drug: Fremanezumab-vfrm Inject 225 mg into the skin every 28 (twenty-eight) days.   ALPRAZolam  0.5 MG tablet Commonly known as: XANAX  Take 1 tablet (0.5 mg total) by mouth daily as needed for anxiety.   divalproex 500 MG DR tablet Commonly known as: DEPAKOTE Take 500 mg by mouth See admin instructions. Take 1 tablet (500mg ) by mouth for 3-5 days as needed to break headache cycle.   DULoxetine  HCl 40 MG Cpep Take 1 capsule (40 mg total) by mouth daily. What changed:  medication strength how much to take   gabapentin  300 MG capsule Commonly known as: NEURONTIN  Take 300 mg by mouth 2 (two) times daily.   ibuprofen  200 MG tablet Commonly known as: ADVIL  Take 400 mg by mouth 2 (two) times daily as needed for headache or moderate pain (pain score 4-6).   ketorolac  60 MG/2ML Soln injection Commonly known as: TORADOL  Inject 60 mg into the muscle once as needed (severe migraine).  levocetirizine 5 MG tablet Commonly known as: XYZAL  Take 5 mg by mouth at bedtime.   MAGNESIUM  PO Take 2 tablets by mouth at bedtime.   ondansetron  4 MG disintegrating tablet Commonly known as: ZOFRAN -ODT Take 1 tablet (4 mg total) by mouth 3 (three) times daily before meals for 3 days, THEN 1 tablet (4 mg total) 3 (three) times daily between meals as needed for  up to 4 days for nausea or vomiting. Start taking on: July 30, 2024 What changed: See the new instructions.   pantoprazole  40 MG tablet Commonly known as: PROTONIX  Take 40 mg by mouth daily.   polyethylene glycol 17 g packet Commonly known as: MiraLax  Take 17 g by mouth daily as needed.   promethazine  12.5 MG tablet Commonly known as: PHENERGAN  Take 1 tablet (12.5 mg total) by mouth every 6 (six) hours as needed for nausea or vomiting.   simethicone  80 MG chewable tablet Commonly known as: MYLICON Chew 1 tablet (80 mg total) by mouth 4 (four) times daily.   tiZANidine  4 MG tablet Commonly known as: Zanaflex  Take 1 tablet (4 mg total) by mouth every 6 (six) hours as needed for muscle spasms. Or headaches.   topiramate  100 MG tablet Commonly known as: TOPAMAX  Take 100 mg by mouth 2 (two) times daily.   Ubrelvy  100 MG Tabs Generic drug: Ubrogepant  Take 100 mg by mouth every 2 (two) hours as needed.       Disposition: Home Diet recommendation: Regular diet  Discharge Exam: Vitals:   07/29/24 1543 07/29/24 2036 07/30/24 0440 07/30/24 0759  BP: 135/78 105/77 (!) 93/54 (!) 95/58  Pulse: (!) 101 73 61 68  Resp: 16   18  Temp: 98.5 F (36.9 C) 98.2 F (36.8 C) 97.9 F (36.6 C) 98.3 F (36.8 C)  TempSrc: Oral Oral Oral Oral  SpO2: 99% 100% 99% 99%  Weight:      Height:       General: in Mild distress, No Rash Abdomen: Bowel Sound present, No tenderness  Filed Weights   07/26/24 1348  Weight: 103.4 kg   Condition at discharge: stable  The results of significant diagnostics from this hospitalization (including imaging, microbiology, ancillary and laboratory) are listed below for reference.   Imaging Studies: DG Abd 1 View Result Date: 07/29/2024 CLINICAL DATA:  Small bowel obstruction. EXAM: ABDOMEN - 1 VIEW COMPARISON:  07/27/2024. FINDINGS: Oral contrast has advanced within the colon, now within the transverse, descending and rectosigmoid colon. No small  bowel dilatation. Nasogastric tube terminates near the pylorus. IMPRESSION: Continued transit of oral contrast within the colon. No small bowel obstruction. Electronically Signed   By: Newell Eke M.D.   On: 07/29/2024 11:54   DG Abd Portable 1V-Small Bowel Obstruction Protocol-initial, 8 hr delay Result Date: 07/27/2024 CLINICAL DATA:  Small bowel obstruction protocol. 8 hour delayed film EXAM: PORTABLE ABDOMEN - 1 VIEW COMPARISON:  07/27/2024 FINDINGS: NG tube tip is in the distal stomach. No gaseous small bowel dilatation to suggest obstruction. Contrast material is seen along the entire length of a nondilated colon. Contrast material in the bladder is consistent with excretion of intravenous contrast material from yesterday's CT scan. IMPRESSION: No gaseous small bowel dilatation to suggest obstruction. Contrast material is seen along the entire length of a nondilated colon compatible with transit through the small bowel. Electronically Signed   By: Camellia Candle M.D.   On: 07/27/2024 12:03   DG Abd Portable 1V-Small Bowel Protocol-Position Verification Result Date: 07/27/2024 CLINICAL DATA:  Check the NGT insertion. EXAM: PORTABLE ABDOMEN - 1 VIEW COMPARISON:  CT with IV contrast yesterday at 4:56 p.m. FINDINGS: 12:40 a.m. NGT is well placed with the tip in distal gastric body. The bowel pattern appears nonobstructive. There is contrast in the renal collecting systems and bladder. No calculus or other significant radiographic findings. IMPRESSION: NGT is well placed with the tip in the distal gastric body. Electronically Signed   By: Francis Quam M.D.   On: 07/27/2024 00:53   CT ABDOMEN PELVIS W CONTRAST Result Date: 07/26/2024 CLINICAL DATA:  3 day history of worsening left-sided abdominal pain. EXAM: CT ABDOMEN AND PELVIS WITH CONTRAST TECHNIQUE: Multidetector CT imaging of the abdomen and pelvis was performed using the standard protocol following bolus administration of intravenous contrast.  RADIATION DOSE REDUCTION: This exam was performed according to the departmental dose-optimization program which includes automated exposure control, adjustment of the mA and/or kV according to patient size and/or use of iterative reconstruction technique. CONTRAST:  OMNIPAQUE  IOHEXOL  300 MG/ML  SOLN COMPARISON:  Pelvic ultrasound earlier same day. Abdomen pelvis CT 03/30/2022 FINDINGS: Lower chest: No acute findings. Hepatobiliary: No suspicious focal abnormality within the liver parenchyma. Small area of low attenuation in the anterior liver, adjacent to the falciform ligament, is in a characteristic location for focal fatty deposition. There is no evidence for gallstones, gallbladder wall thickening, or pericholecystic fluid. No intrahepatic or extrahepatic biliary dilation. Pancreas: No focal mass lesion. No dilatation of the main duct. No intraparenchymal cyst. No peripancreatic edema. Spleen: No splenomegaly. No suspicious focal mass lesion. Adrenals/Urinary Tract: No adrenal nodule or mass. Kidneys unremarkable. No evidence for hydroureter. The urinary bladder appears normal for the degree of distention. Stomach/Bowel: Stomach is unremarkable. No gastric wall thickening. No evidence of outlet obstruction. Duodenum is normally positioned as is the ligament of Treitz. Proximal small bowel is nondistended. Small bowel loops in the pelvis are fluid-filled and distended up to a bowel 2.4 cm maximum diameter. There is clustered small bowel the central pelvis (see axial 67/2) with a configuration raising the question of internal hernia although this possibility seems less likely on coronal and sagittal imaging. There is a small bowel adhesion in the right pelvis (59/2 axial and 65/4 coronal which may result in a component of small-bowel obstruction as distal ileal loops extending into the terminal ileum are completely decompressed. The appendix is best seen on coronal images and is unremarkable.  Vascular/Lymphatic: No abdominal aortic aneurysm. No abdominal aortic atherosclerotic calcification. There is no gastrohepatic or hepatoduodenal ligament lymphadenopathy. No retroperitoneal or mesenteric lymphadenopathy. No pelvic sidewall lymphadenopathy. Reproductive: Uterus surgically absent. Left ovary unremarkable. Right ovary is position along the medial wall the cecum without a discrete ovarian mass lesion evident. Other: No intraperitoneal free fluid. Musculoskeletal: No worrisome lytic or sclerotic osseous abnormality. IMPRESSION: 1. A cluster small bowel loops in the pelvis are fluid-filled and distended up to a bowel 2.4 cm maximum diameter. There is a small bowel adhesion in the right pelvis which may result in a component of small-bowel stricture or partial obstruction as distal ileal loops extending into the terminal ileum are completely decompressed. No bowel wall thickening or pneumatosis. No intraperitoneal free fluid. 2. Appendix unremarkable. 3. No evidence for adnexal mass. Electronically Signed   By: Camellia Candle M.D.   On: 07/26/2024 17:14   US  Pelvis Complete Result Date: 07/26/2024 CLINICAL DATA:  LLQ pain. EXAM: TRANSABDOMINAL AND TRANSVAGINAL ULTRASOUND OF PELVIS DOPPLER ULTRASOUND OF OVARIES TECHNIQUE: Both transabdominal and transvaginal ultrasound examinations  of the pelvis were performed. Transabdominal technique was performed for global imaging of the pelvis including uterus, ovaries, adnexal regions, and pelvic cul-de-sac. It was necessary to proceed with endovaginal exam following the transabdominal exam to visualize the bilateral ovaries and bilateral adnexa. Color and duplex Doppler ultrasound was utilized to evaluate blood flow to the ovaries. COMPARISON:  None Available. FINDINGS: Uterus Surgically absent. Right ovary Measurements: 2.3 x 2.6 x 3.7 cm = volume: 11.6 mL. Normal appearance/no adnexal mass. Left ovary Measurements: 2.0 x 2.4 x 3.8 cm = volume: 9.7 mL. Normal  appearance/no adnexal mass. Pulsed Doppler evaluation of both ovaries demonstrates normal low-resistance arterial and venous waveforms. Other findings No abnormal free fluid. IMPRESSION: Surgically absent uterus. Otherwise unremarkable exam. Electronically Signed   By: Ree Molt M.D.   On: 07/26/2024 15:21   US  Transvaginal Non-OB Result Date: 07/26/2024 CLINICAL DATA:  LLQ pain. EXAM: TRANSABDOMINAL AND TRANSVAGINAL ULTRASOUND OF PELVIS DOPPLER ULTRASOUND OF OVARIES TECHNIQUE: Both transabdominal and transvaginal ultrasound examinations of the pelvis were performed. Transabdominal technique was performed for global imaging of the pelvis including uterus, ovaries, adnexal regions, and pelvic cul-de-sac. It was necessary to proceed with endovaginal exam following the transabdominal exam to visualize the bilateral ovaries and bilateral adnexa. Color and duplex Doppler ultrasound was utilized to evaluate blood flow to the ovaries. COMPARISON:  None Available. FINDINGS: Uterus Surgically absent. Right ovary Measurements: 2.3 x 2.6 x 3.7 cm = volume: 11.6 mL. Normal appearance/no adnexal mass. Left ovary Measurements: 2.0 x 2.4 x 3.8 cm = volume: 9.7 mL. Normal appearance/no adnexal mass. Pulsed Doppler evaluation of both ovaries demonstrates normal low-resistance arterial and venous waveforms. Other findings No abnormal free fluid. IMPRESSION: Surgically absent uterus. Otherwise unremarkable exam. Electronically Signed   By: Ree Molt M.D.   On: 07/26/2024 15:21   US  Art/Ven Flow Abd Pelv Doppler Result Date: 07/26/2024 CLINICAL DATA:  LLQ pain. EXAM: TRANSABDOMINAL AND TRANSVAGINAL ULTRASOUND OF PELVIS DOPPLER ULTRASOUND OF OVARIES TECHNIQUE: Both transabdominal and transvaginal ultrasound examinations of the pelvis were performed. Transabdominal technique was performed for global imaging of the pelvis including uterus, ovaries, adnexal regions, and pelvic cul-de-sac. It was necessary to proceed with  endovaginal exam following the transabdominal exam to visualize the bilateral ovaries and bilateral adnexa. Color and duplex Doppler ultrasound was utilized to evaluate blood flow to the ovaries. COMPARISON:  None Available. FINDINGS: Uterus Surgically absent. Right ovary Measurements: 2.3 x 2.6 x 3.7 cm = volume: 11.6 mL. Normal appearance/no adnexal mass. Left ovary Measurements: 2.0 x 2.4 x 3.8 cm = volume: 9.7 mL. Normal appearance/no adnexal mass. Pulsed Doppler evaluation of both ovaries demonstrates normal low-resistance arterial and venous waveforms. Other findings No abnormal free fluid. IMPRESSION: Surgically absent uterus. Otherwise unremarkable exam. Electronically Signed   By: Ree Molt M.D.   On: 07/26/2024 15:21    Microbiology: Results for orders placed or performed during the hospital encounter of 07/30/21  Resp Panel by RT-PCR (Flu A&B, Covid) Nasopharyngeal Swab     Status: None   Collection Time: 07/30/21  8:25 PM   Specimen: Nasopharyngeal Swab; Nasopharyngeal(NP) swabs in vial transport medium  Result Value Ref Range Status   SARS Coronavirus 2 by RT PCR NEGATIVE NEGATIVE Final    Comment: (NOTE) SARS-CoV-2 target nucleic acids are NOT DETECTED.  The SARS-CoV-2 RNA is generally detectable in upper respiratory specimens during the acute phase of infection. The lowest concentration of SARS-CoV-2 viral copies this assay can detect is 138 copies/mL. A negative result does not  preclude SARS-Cov-2 infection and should not be used as the sole basis for treatment or other patient management decisions. A negative result may occur with  improper specimen collection/handling, submission of specimen other than nasopharyngeal swab, presence of viral mutation(s) within the areas targeted by this assay, and inadequate number of viral copies(<138 copies/mL). A negative result must be combined with clinical observations, patient history, and epidemiological information. The expected  result is Negative.  Fact Sheet for Patients:  BloggerCourse.com  Fact Sheet for Healthcare Providers:  SeriousBroker.it  This test is no t yet approved or cleared by the [place]  FDA and  has been authorized for detection and/or diagnosis of SARS-CoV-2 by FDA under an Emergency Use Authorization (EUA). This EUA will remain  in effect (meaning this test can be used) for the duration of the COVID-19 declaration under Section 564(b)(1) of the Act, 21 U.S.C.section 360bbb-3(b)(1), unless the authorization is terminated  or revoked sooner.       Influenza A by PCR NEGATIVE NEGATIVE Final   Influenza B by PCR NEGATIVE NEGATIVE Final    Comment: (NOTE) The Xpert Xpress SARS-CoV-2/FLU/RSV plus assay is intended as an aid in the diagnosis of influenza from Nasopharyngeal swab specimens and should not be used as a sole basis for treatment. Nasal washings and aspirates are unacceptable for Xpert Xpress SARS-CoV-2/FLU/RSV testing.  Fact Sheet for Patients: BloggerCourse.com  Fact Sheet for Healthcare Providers: SeriousBroker.it  This test is not yet approved or cleared by the [place]  FDA and has been authorized for detection and/or diagnosis of SARS-CoV-2 by FDA under an Emergency Use Authorization (EUA). This EUA will remain in effect (meaning this test can be used) for the duration of the COVID-19 declaration under Section 564(b)(1) of the Act, 21 U.S.C. section 360bbb-3(b)(1), unless the authorization is terminated or revoked.  Performed at [facility], [address] [place], [place] [address]    Labs: CBC: Recent Labs  Lab 07/26/24 1351 07/28/24 0609 07/29/24 0657 07/30/24 0330  WBC 6.3 6.2 6.9 6.7  HGB 12.8 11.9* 12.3 11.9*  HCT 38.2 35.9* 36.5 35.3*  MCV 93.2 93.7 92.6 92.7  PLT 251 243 225 240   Basic Metabolic Panel: Recent Labs  Lab  07/26/24 1351 07/27/24 0731 07/28/24 0609 07/29/24 0657 07/30/24 0330  NA 142 140 139 141 138  K 4.1 3.5 3.9 3.5 3.8  CL 109 116* 114* 111 109  CO2 20* 20* 19* 22 20*  GLUCOSE 99 102* 95 89 91  BUN 8 7 5* 10 8  CREATININE 0.90 0.93 0.82 0.83 0.86  CALCIUM 9.7 8.8* 8.8* 9.4 9.0  MG  --   --  1.9 1.8 1.9  PHOS  --   --   --  4.5  --    Liver Function Tests: Recent Labs  Lab 07/26/24 1351  AST 18  ALT 18  ALKPHOS 64  BILITOT 0.3  PROT 7.3  ALBUMIN 4.4   CBG: No results for input(s): GLUCAP in the last 168 hours.  Discharge time spent: greater than 30 minutes.  Author: Yetta Blanch, MD  [facility] 07/30/2024

## 2024-08-06 NOTE — Telephone Encounter (Signed)
 S: Diarrhea, change in stool color and consistency, abd pain, nausea B:  Abd pain, decreased appetite, Nausea, Constipation A: Pt states that she has had loose stools since Thursday of last week. They are seedy, yellow, like a breast fed newborn 7+ daily. No bleeding noted. Urgency with 1 accident. They are very foul smelling. She is not bloated.  She has LLQ pain that will wax and wane. Eating and drinking make it worse. Solid foods bring on severe pain. Some nausea without vomiting. See most recent ED notes. No sooner availability for an appt. What do you advise?

## 2024-08-07 NOTE — Telephone Encounter (Signed)
 Copied from CRM #[id number]. Topic: Clinical Concerns - Medical Question >> Aug 07, 2024  9:03 AM April H wrote: [name] Sanchez, [name] Sanchez is calling other request    Include all details related to the request(s) below: Patient says she lives an hour away from GI doctor. Says labs have been ordered by GI & she's wanting to know if pcp office has stool kit that she needs for test & if she can pick up kit from pcp office.     Confirm and type the Best Contact Number below:  Patient/caller contact number: [phone]            [] Home  [x] Mobile  [] Work  []  Other   []  Okay to leave a voicemail   Medication List:  Current Outpatient Medications:  .  ALPRAZolam  (XANAX ) 0.5 mg tablet, TAKE 1 TABLET BY MOUTH DAILY AS NEEDED FOR SLEEP OR ANXIETY., Disp: 15 tablet, Rfl: 0 .  cholecalciferol, vitamin D3, (VITAMIN D3 ORAL), Take 1 tablet by mouth daily., Disp: , Rfl:  .  CYANOCOBALAMIN, VITAMIN B-12, ORAL, Take 1 tablet by mouth daily., Disp: , Rfl:  .  divalproex (DEPAKOTE DR) 500 mg 12 hr tablet, 500 mg BID for 3-5 days to break a headache cycle, Disp: 20 tablet, Rfl: 1 .  DULoxetine  (CYMBALTA ) 30 mg capsule, Take 1 capsule (30 mg total) by mouth daily. X one week, then two daily., Disp: 60 capsule, Rfl: 3 .  EPINEPHrine  (EPIPEN ) 0.3 mg/0.3 mL injection syringe, Inject 0.3 mg into the muscle once as needed for anaphylaxis for up to 1 dose., Disp: 1 each, Rfl: 1 .  fremanezumab-vfrm (Ajovy Autoinjector) 225 mg/1.5 mL atIn injection, Inject 1.5 mL (225 mg total) under the skin every 28 days., Disp: 1 mL, Rfl: 11 .  gabapentin  (NEURONTIN ) 300 mg capsule, TAKE 1 CAPSULE BY MOUTH 2 TIMES DAILY., Disp: 60 capsule, Rfl: 4 .  ketorolac  (TORADOL ) 60 mg/2 mL soln, Inject 2 mLs (60 mg total) into the muscle as needed (for severe headache rescue)., Disp: 10 mL, Rfl: 2 .  levocetirizine (XYZAL ) 5 mg tablet, Take 5 mg by mouth every evening., Disp: , Rfl:  .  MAGNESIUM  OXIDE ORAL, Take 500 mg by mouth nightly., Disp: ,  Rfl:  .  ondansetron  (ZOFRAN -ODT) 4 mg disintegrating tablet, Dissolve 4 mg on tongue every 8 (eight) hours as needed for nausea or vomiting., Disp: , Rfl:  .  pantoprazole  (PROTONIX ) 40 mg EC tablet, TAKE 1 TABLET (40 MG TOTAL) BY MOUTH DAILY., Disp: 90 tablet, Rfl: 3 .  promethazine  (PHENERGAN ) 25 mg tablet, Take 25 mg by mouth every 6 (six) hours as needed for nausea or vomiting., Disp: , Rfl:  .  syringe with needle (BD SafetyGlide Syringe) 3 mL 22 x 1 1/2 syrg, Use to inject ketorolac ., Disp: 20 each, Rfl: 5 .  tiZANidine  (ZANAFLEX ) 4 mg tablet, TAKE 1 TABLET BY MOUTH EVERY 6 HOURS AS NEEDED FOR MUSCLE SPASMS (HEADACHE), Disp: 60 tablet, Rfl: 5 .  topiramate  (TOPAMAX ) 100 mg tablet, TAKE 1 TABLET BY MOUTH 2 TIMES A DAY., Disp: 60 tablet, Rfl: 4 .  traZODone (DESYREL) 50 mg tablet, Take 1 tablet (50 mg total) by mouth nightly as needed for sleep., Disp: 30 tablet, Rfl: 3 .  ubrogepant  (UBRELVY ) 100 mg tab tablet, Take 100mg  at onset of headache, repeat 2hr later if headache persists. Do not exceed 200mg  in 24hr., Disp: 16 tablet, Rfl: 6     Medication Request/Refills: Pharmacy Information (if applicable)   [x]  Not  Applicable       []  Pharmacy listed  Send Medication Request to:                                                 []  Pharmacy not listed (added to pharmacy list in Epic) Send Medication Request to:      Listed Pharmacies: [facility] - [place], [place] - [address] - PHONE: [phone] [place]: [phone] [facility] - [place], [place] - [address] - PHONE: [phone] - FAX: [phone] [facility] [address] [place], [place] - [address] AT [facility] [address] [facility] [address] - PHONE: [phone] - FAX: [phone] [id number] - [place],  - [address] - PHONE: [phone] - FAX: [phone]

## 2024-08-07 NOTE — Telephone Encounter (Signed)
 Spoke with the pt and notified her of the recommendations from [place]. Pt states that she had a lot of pain last night. Today, she is doing better. She will try to complete stool studies today and is aware of ED recommendations.

## 2024-08-09 ENCOUNTER — Emergency Department (HOSPITAL_BASED_OUTPATIENT_CLINIC_OR_DEPARTMENT_OTHER)

## 2024-08-09 ENCOUNTER — Other Ambulatory Visit: Payer: Self-pay

## 2024-08-09 ENCOUNTER — Encounter (HOSPITAL_BASED_OUTPATIENT_CLINIC_OR_DEPARTMENT_OTHER): Payer: Self-pay | Admitting: Emergency Medicine

## 2024-08-09 ENCOUNTER — Emergency Department (HOSPITAL_BASED_OUTPATIENT_CLINIC_OR_DEPARTMENT_OTHER)
Admission: EM | Admit: 2024-08-09 | Discharge: 2024-08-09 | Disposition: A | Discharge status: Home / Self Care | Attending: Emergency Medicine | Admitting: Emergency Medicine

## 2024-08-09 DIAGNOSIS — Z79899 Other long term (current) drug therapy: Secondary | ICD-10-CM | POA: Insufficient documentation

## 2024-08-09 DIAGNOSIS — R112 Nausea with vomiting, unspecified: Secondary | ICD-10-CM | POA: Diagnosis present

## 2024-08-09 DIAGNOSIS — K529 Noninfective gastroenteritis and colitis, unspecified: Secondary | ICD-10-CM | POA: Insufficient documentation

## 2024-08-09 HISTORY — DX: Unspecified intestinal obstruction, unspecified as to partial versus complete obstruction: K56.609

## 2024-08-09 LAB — URINALYSIS, ROUTINE W REFLEX MICROSCOPIC
Bilirubin Urine: NEGATIVE
Glucose, UA: NEGATIVE mg/dL
Hgb urine dipstick: NEGATIVE
Ketones, ur: NEGATIVE mg/dL
Leukocytes,Ua: NEGATIVE
Nitrite: NEGATIVE
Protein, ur: NEGATIVE mg/dL
Specific Gravity, Urine: 1.006 (ref 1.005–1.030)
pH: 7.5 (ref 5.0–8.0)

## 2024-08-09 LAB — COMPREHENSIVE METABOLIC PANEL WITH GFR
ALT: 17 U/L (ref 0–44)
AST: 18 U/L (ref 15–41)
Albumin: 4.6 g/dL (ref 3.5–5.0)
Alkaline Phosphatase: 67 U/L (ref 38–126)
Anion gap: 15 (ref 5–15)
BUN: 13 mg/dL (ref 6–20)
CO2: 18 mmol/L — ABNORMAL LOW (ref 22–32)
Calcium: 10.3 mg/dL (ref 8.9–10.3)
Chloride: 106 mmol/L (ref 98–111)
Creatinine, Ser: 0.87 mg/dL (ref 0.44–1.00)
GFR, Estimated: >60 mL/min (ref >60)
Glucose, Bld: 94 mg/dL (ref 70–99)
Potassium: 3.4 mmol/L — ABNORMAL LOW (ref 3.5–5.1)
Sodium: 139 mmol/L (ref 135–145)
Total Bilirubin: 0.4 mg/dL (ref 0.0–1.2)
Total Protein: 7.9 g/dL (ref 6.5–8.1)

## 2024-08-09 LAB — CBC WITH DIFFERENTIAL/PLATELET
Abs Immature Granulocytes: 0.03 K/uL (ref 0.00–0.07)
Basophils Absolute: 0 K/uL (ref 0.0–0.1)
Basophils Relative: 0 %
Eosinophils Absolute: 0.1 K/uL (ref 0.0–0.5)
Eosinophils Relative: 1 %
HCT: 36.3 % (ref 36.0–46.0)
Hemoglobin: 12.7 g/dL (ref 12.0–15.0)
Immature Granulocytes: 0 %
Lymphocytes Relative: 25 %
Lymphs Abs: 1.7 K/uL (ref 0.7–4.0)
MCH: 31.4 pg (ref 26.0–34.0)
MCHC: 35 g/dL (ref 30.0–36.0)
MCV: 89.6 fL (ref 80.0–100.0)
Monocytes Absolute: 0.6 K/uL (ref 0.1–1.0)
Monocytes Relative: 9 %
Neutro Abs: 4.4 K/uL (ref 1.7–7.7)
Neutrophils Relative %: 65 %
Platelets: 240 K/uL (ref 150–400)
RBC: 4.05 MIL/uL (ref 3.87–5.11)
RDW: 12.9 % (ref 11.5–15.5)
WBC: 6.8 K/uL (ref 4.0–10.5)
nRBC: 0 % (ref 0.0–0.2)

## 2024-08-09 LAB — LIPASE, BLOOD: Lipase: 31 U/L (ref 11–51)

## 2024-08-09 MED ORDER — MORPHINE SULFATE (PF) 4 MG/ML IV SOLN
4.0000 mg | Freq: Once | INTRAVENOUS | Status: AC
  Administered 2024-08-09: 4 mg via INTRAVENOUS
  Filled 2024-08-09: qty 1

## 2024-08-09 MED ORDER — IOHEXOL 300 MG/ML  SOLN
100.0000 mL | Freq: Once | INTRAMUSCULAR | Status: AC | PRN
  Administered 2024-08-09: 100 mL via INTRAVENOUS

## 2024-08-09 MED ORDER — SODIUM CHLORIDE 0.9 % IV BOLUS
1000.0000 mL | Freq: Once | INTRAVENOUS | Status: AC
  Administered 2024-08-09: 1000 mL via INTRAVENOUS

## 2024-08-09 MED ORDER — METOCLOPRAMIDE HCL 5 MG/ML IJ SOLN
10.0000 mg | Freq: Once | INTRAMUSCULAR | Status: DC
  Filled 2024-08-09: qty 2

## 2024-08-09 MED ORDER — ONDANSETRON HCL 4 MG/2ML IJ SOLN
4.0000 mg | Freq: Once | INTRAMUSCULAR | Status: AC
Start: 2024-08-09 — End: 2024-08-09
  Administered 2024-08-09: 4 mg via INTRAVENOUS
  Filled 2024-08-09: qty 2

## 2024-08-09 NOTE — ED Triage Notes (Signed)
 Lower abdo pain since sbo in August Had diarrhea not no BM since yesterday  Unable to eat, vomiting last night LLQ Per patient decreased bowel sounds yesterday at PCP

## 2024-08-09 NOTE — ED Notes (Signed)
 Pt unable to provide UA at this time. Aware a specimen is needed.

## 2024-08-09 NOTE — Telephone Encounter (Signed)
 Patient was seen yesterday.

## 2024-08-09 NOTE — ED Notes (Signed)
 DC paperwork given and verbally understood.... Pt aware of no drinking/driving due to the meds given.SABRASABRA

## 2024-08-09 NOTE — Discharge Instructions (Addendum)
 Fortunately CT scan today did not show any evidence of small bowel obstruction.  There are evidence of mild enteritis which is inflammation of your small intestine likely due to a viral infection.  This will improve over time.  Follow-up closely with your doctor for further care, return if you have any concerns

## 2024-08-09 NOTE — ED Provider Notes (Signed)
 [place] EMERGENCY DEPARTMENT AT [facility] Provider Note   CSN: [id number] Arrival date & time: 08/09/24  1152     Patient presents with: Abdominal Pain   PAIDYN MCFERRAN is a 28 y.o. female.   The history is provided by the patient and medical records. No language interpreter was used.  Abdominal Pain    28 year old female with history of SBO, IBS, anxiety, ovarian cyst presenting with complaint of abdominal pain.  Patient states that she was previously diagnosed with partial small bowel obstruction several weeks ago requiring hospitalization and NG tube.  Since then she has not felt normal.  She has had recurrent episodes of abdominal pain for the past several days coupled with nausea and vomiting and unable to keep anything down since yesterday.  She does not endorse any fever or chills no urinary symptoms.  She was seen by her PCP yesterday for complaint and at that time she was recommended to have an abdominal pelvis CT scan but patient declined.  However later on that afternoon when she had dinner she felt nauseous and vomited up her food.  She has prior partial hysterectomy.  She denies any prior abdominal surgeries.  Prior to Admission medications   Medication Sig Start Date End Date Taking? Authorizing Provider  acetaminophen  (TYLENOL ) 500 MG tablet Take 1,000 mg by mouth 2 (two) times daily as needed for moderate pain (pain score 4-6), fever or headache.    [provider]  AJOVY 225 MG/1.5ML SOAJ Inject 225 mg into the skin every 28 (twenty-eight) days. 06/12/24   [provider]  ALPRAZolam  (XANAX ) 0.5 MG tablet Take 1 tablet (0.5 mg total) by mouth daily as needed for anxiety. 11/12/18   Danford, Rockie D, NP  divalproex  (DEPAKOTE ) 500 MG DR tablet Take 500 mg by mouth See admin instructions. Take 1 tablet (500mg ) by mouth for 3-5 days as needed to break headache cycle.    [provider]  DULoxetine  40 MG CPEP Take 1 capsule (40 mg total) by  mouth daily. 07/31/24   Tobie Yetta HERO, MD  gabapentin  (NEURONTIN ) 300 MG capsule Take 300 mg by mouth 2 (two) times daily. 12/27/22   [provider]  ibuprofen  (ADVIL ) 200 MG tablet Take 400 mg by mouth 2 (two) times daily as needed for headache or moderate pain (pain score 4-6).    [provider]  ketorolac  (TORADOL ) 60 MG/2ML SOLN injection Inject 60 mg into the muscle once as needed (severe migraine).    [provider]  levocetirizine (XYZAL ) 5 MG tablet Take 5 mg by mouth at bedtime.    [provider]  MAGNESIUM  PO Take 2 tablets by mouth at bedtime.    [provider]  pantoprazole  (PROTONIX ) 40 MG tablet Take 40 mg by mouth daily. 01/25/23   [provider]  polyethylene glycol (MIRALAX ) 17 g packet Take 17 g by mouth daily as needed. 07/30/24   Tobie Yetta HERO, MD  promethazine  (PHENERGAN ) 12.5 MG tablet Take 1 tablet (12.5 mg total) by mouth every 6 (six) hours as needed for nausea or vomiting. 08/03/21   Ines Onetha NOVAK, MD  simethicone  (MYLICON) 80 MG chewable tablet Chew 1 tablet (80 mg total) by mouth 4 (four) times daily. 07/30/24   Tobie Yetta HERO, MD  tiZANidine  (ZANAFLEX ) 4 MG tablet Take 1 tablet (4 mg total) by mouth every 6 (six) hours as needed for muscle spasms. Or headaches. 08/03/21   Ines Onetha NOVAK, MD  topiramate  (TOPAMAX )  100 MG tablet Take 100 mg by mouth 2 (two) times daily. 07/01/24   [provider]  Ubrogepant  (UBRELVY ) 100 MG TABS Take 100 mg by mouth every 2 (two) hours as needed. 08/03/21   Ines Onetha NOVAK, MD    Allergies: Compazine  [prochlorperazine ], Nurtec [rimegepant sulfate ], Percocet [oxycodone -acetaminophen ], Ceftin [cefuroxime], and Penicillins    Review of Systems  Gastrointestinal:  Positive for abdominal pain.  All other systems reviewed and are negative.   Updated Vital Signs BP (!) 144/101   Temp 98.9 F (37.2 C) (Oral)   Resp 20   LMP 07/15/2018 (Exact Date)   Physical  Exam Vitals and nursing note reviewed.  Constitutional:      General: She is not in acute distress.    Appearance: She is well-developed.  HENT:     Head: Atraumatic.  Eyes:     Conjunctiva/sclera: Conjunctivae normal.  Cardiovascular:     Rate and Rhythm: Normal rate and regular rhythm.  Pulmonary:     Effort: Pulmonary effort is normal.  Abdominal:     General: Bowel sounds are absent.     Tenderness: There is abdominal tenderness. There is guarding.  Musculoskeletal:     Cervical back: Neck supple.  Skin:    Findings: No rash.  Neurological:     Mental Status: She is alert.  Psychiatric:        Mood and Affect: Mood normal.     (all labs ordered are listed, but only abnormal results are displayed) Labs Reviewed  COMPREHENSIVE METABOLIC PANEL WITH GFR - Abnormal; Notable for the following components:      Result Value   Potassium 3.4 (*)    CO2 18 (*)    All other components within normal limits  URINALYSIS, ROUTINE W REFLEX MICROSCOPIC - Abnormal; Notable for the following components:   Color, Urine COLORLESS (*)    All other components within normal limits  CBC WITH DIFFERENTIAL/PLATELET  LIPASE, BLOOD    EKG: None  Radiology: CT ABDOMEN PELVIS W CONTRAST Result Date: 08/09/2024 CLINICAL DATA:  Lower abdominal pain. Prior small bowel obstruction. Vomiting. EXAM: CT ABDOMEN AND PELVIS WITH CONTRAST TECHNIQUE: Multidetector CT imaging of the abdomen and pelvis was performed using the standard protocol following bolus administration of intravenous contrast. RADIATION DOSE REDUCTION: This exam was performed according to the departmental dose-optimization program which includes automated exposure control, adjustment of the mA and/or kV according to patient size and/or use of iterative reconstruction technique. CONTRAST:  OMNIPAQUE  IOHEXOL  300 MG/ML  SOLN COMPARISON:  Radiographs 07/29/2024 and CT scan 07/31/2024 FINDINGS: Lower chest: Unremarkable Hepatobiliary: Focal  steatosis in segment 4 adjacent to the falciform ligament. This appearance is benign and no further imaging workup is indicated. Gallbladder unremarkable. Pancreas: Unremarkable Spleen: Unremarkable Adrenals/Urinary Tract: Unremarkable Stomach/Bowel: No dilated bowel to indicate obstruction. There are some air-fluid levels in normal caliber loops of otherwise fluid-filled distal small bowel. Some of these loops sits adjacent to the right ovary. Normal caliber appendix adjacent to the right ureter on image 62 series 2. Vascular/Lymphatic: Unremarkable Reproductive: Uterus absent. Suspected corpus luteum in the right ovary measuring 1.8 cm in diameter. Right ovary margins partially obscured by adjacent fluid-filled loops of bowel. Left ovary unremarkable. Other: No supplemental non-categorized findings. Musculoskeletal: Unremarkable IMPRESSION: 1. No dilated bowel to indicate obstruction. There are some air-fluid levels in normal caliber fluid-filled loops of distal small bowel, which could be from a low-grade enteritis. Electronically Signed   By: Ryan Salvage M.D.   On:  08/09/2024 14:22     Procedures   Medications Ordered in the ED  metoCLOPramide  (REGLAN ) injection 10 mg (has no administration in time range)  ondansetron  (ZOFRAN ) injection 4 mg (4 mg Intravenous Given 08/09/24 1323)  morphine  (PF) 4 MG/ML injection 4 mg (4 mg Intravenous Given 08/09/24 1325)  sodium chloride  0.9 % bolus 1,000 mL (1,000 mLs Intravenous New Bag/Given 08/09/24 1322)  iohexol  (OMNIPAQUE ) 300 MG/ML solution 100 mL (100 mLs Intravenous Contrast Given 08/09/24 1401)                                    Medical Decision Making Amount and/or Complexity of Data Reviewed Labs: ordered. Radiology: ordered.  Risk Prescription drug management.   BP (!) 144/101   Temp 98.9 F (37.2 C) (Oral)   Resp 20   LMP 07/15/2018 (Exact Date)   32:60 PM 28 year old female with history of SBO, IBS, anxiety, ovarian cyst  presenting with complaint of abdominal pain.  Patient states that she was previously diagnosed with partial small bowel obstruction several weeks ago requiring hospitalization and NG tube.  Since then she has not felt normal.  She has had recurrent episodes of abdominal pain for the past several days coupled with nausea and vomiting and unable to keep anything down since yesterday.  She does not endorse any fever or chills no urinary symptoms.  She was seen by her PCP yesterday for complaint and at that time she was recommended to have an abdominal pelvis CT scan but patient declined.  However later on that afternoon when she had dinner she felt nauseous and vomited up her food.  She has prior partial hysterectomy.  She denies any prior abdominal surgeries.  On exam, patient is notable to have diffuse abdominal tenderness with decreased bowel sounds concerning for SBO.  Given symptom, will repeat abdominal pelvis CT scan for further assessment.  Patient given antiemetic and opiate pain medication for symptom control.  Will also give IV fluid.  -Labs ordered, independently viewed and interpreted by me.  Labs remarkable for reassuring labs -The patient was maintained on a cardiac monitor.  I personally viewed and interpreted the cardiac monitored which showed an underlying rhythm of: Normal sinus rhythm -Imaging independently viewed and interpreted by me and I agree with radiologist's interpretation.  Result remarkable for abdominal pelvis CT scan without evidence of SBO.  Mild enteritis noted. -This patient presents to the ED for concern of abdominal pain, this involves an extensive number of treatment options, and is a complaint that carries with it a high risk of complications and morbidity.  The differential diagnosis includes small bowel obstruction, colitis, enteritis, cholecystitis, appendicitis, UTI, pyelonephritis, pancreatitis, gastritis -Co morbidities that complicate the patient evaluation includes  SBO, IBS, anxiety -Treatment includes morphine , Zofran , IV fluid -Reevaluation of the patient after these medicines showed that the patient improved.  Patient tolerates p.o. -PCP office notes or outside notes reviewed -Escalation to admission/observation considered: patients feels much better, is comfortable with discharge, and will follow up with PCP -Prescription medication considered, patient comfortable with home medication.  I offered antiemetic but patient states she has some at home -Social Determinant of Health considered      Final diagnoses:  Nausea and vomiting, unspecified vomiting type  Enteritis    ED Discharge Orders     None          Nivia Colon, PA-C 08/09/24 1458  Mannie Pac T, DO 08/13/24 [id number]

## 2024-08-13 NOTE — Progress Notes (Signed)
 Gastroenterology Clinic Return Patient Visit  Dear Dr. Olene, Charleen Tinnie Olene, [facility] [address] [place],  [place] [address] Thank you for allowing us  to see LINDEY RENZULLI in the [facility] clinic on 08/13/2024.  Please review our records as below:  Date of Service: 08/13/2024 Patient Name: EMELIE NEWSOM Patient DOB: 07-Apr-1996    Assessment & Plan 28 year old female with a PMH of hysterctomy, fibromyalgia with a recent a partial small bowel obstruction in late August who presents for ongoing LLQ abdominal pain nausea and diarrhea.Symptoms briefly relented only to return resulting in two additional ED visits with  mor CT scans that did not reveal PSBO but rather low grade enteritis. Will obtain a KUB to rule out obstruction and obtain lab work and stools studies to rule out infectious process. Recommended she remain on clear liquid and return to the ED if symptoms worsen in the meantime.   1.LLQ Abdominal pain: - Persistent LLQ pain, nausea and diarrhea since partial bowel obstruction, described as sharp and crampy, radiating to the back. - Recent CT imaging 08/2024 showed distal small bowel -low grade enteritis - Zofran  refilled for nausea - Bentyl 10mg  AC at bedtime PRN for abdominal pain - Clear liquid diet recommended - CBC, CMP, Lipase - KUB to rule out obstruction, consider repeat CT pending results.  - Discussed potential differential diagnoses including recurrent PSBO vs IBS. - Advised to return to the ED for worsening abdominal pain or vomiting occur.  2. Nausea - Recent CT imaging 08/2024 showed distal small bowel -low grade enteritis - Zofran  refilled for nausea - Bentyl 10mg  AC at bedtime PRN for abdominal pain - Clear liquid diet recommended - CBC, CMP, Lipase - KUB to rule out obstruction, consider repeat CT pending results.  - Discussed potential differential diagnoses including recurrent PSBO vs IBS. - Advised to return to  the ED for worsening abdominal pain or vomiting occur.  3. Diarrhea - Recent CT imaging 08/2024 showed distal small bowel -low grade enteritis - Zofran  refilled for nausea - Bentyl 10mg  AC at bedtime PRN for abdominal pain - Clear liquid diet recommended - CBC, CMP, Lipase - Check stools for infection -check C. Difficile, GIP - KUB to rule out obstruction, consider repeat CT pending results.  - Discussed potential differential diagnoses including recurrent PSBO vs IBS. - Advised to return to the ED for worsening abdominal pain or vomiting occur.   Patient verbalizes understanding and in agreement with the above plan. All questions answered.   Medication side effects discussed with patient. Advised patient to call clinic or return for visit if these symptoms occur.  Goals of care discussed with patient including med compliance and adequate follow up.  Return in about 1 month (around 09/12/2024).   Subjective:   History of Present Illness The patient is a 28 year old female with a PMH of hysterctomy, fibromyalgia who presents for LLQ abdominal pain and nausea.  She reports on 07/26/24 she developed acute onset abdominal pain was due to a kidney infection or ovarian cyst. After being referred from urgent care to the emergency department, a CT scan revealed she was diagnosed with a partial small bowel obstruction, requiring a 4-day hospital stay and the use of an NG tube.  Although the obstruction resolved itself, she has had recurrent episodes of severe LLQ pain, nausea and vomiting, most recently last night. Her symptoms have not improved since the bowel obstruction. Symptoms have resulted in two more visits to the ED  with two additional CT scan that did not reveal obstruction.   She has been taking Zofran , which she has almost used up, and took 2 Tylenol  this morning for pain relief, but it was ineffective. She has also tried MiraLAX , stool softeners, and enemas at home. Her bowel  movements are watery and yellow in color, but she reports no blood in her stool. The pain is primarily located in the left lower quadrant and center of her abdomen, and she felt bloated this morning. She rates her current pain level as 6 out of 10. She took 2 Tylenol  at 9:30 AM and Zofran  at 11:30 AM today. This is her first experience with these symptoms. Her most recent imaging showed inflammation. She continues to experience nausea, with her last episode of vomiting occurring on Friday. Her nausea is intermittent and is managed with Zofran . The pain radiates to her back and is described as sharp, followed by a dull crampiness. She has a history of ovarian cysts, but the current pain is different and more intense. She uses a heating pad for relief. She has not yet undergone an endoscopy due to scheduling issues. She continues to take pantoprazole  for GERD management. She reports no changes in lifestyle or diet that could have triggered the bowel obstruction. She reports no changes in urination, painful urination, or incontinence, but notes that it takes a few minutes due to pressure. Her pain does not improve after bowel movements and actually worsens after diarrhea. She reports no pain with deep breathing, but experiences pain when lying back.  SOCIAL HISTORY Diet: Protein shakes, applesauce, water  Allergies Allergies[1]  Medications Current Medications[2]  Medical history Medical History[3]  Surgical History Surgical History[4]  Social History Tobacco Use History[5]   Family History Family History[6]  I have reviewed and (if needed) updated the patient's problem list, medications, allergies, past medical and surgical history, social and family history.  Health Maintenance Status   Patient has no pending health maintenance at this time     Review of Systems  Constitutional:  Positive for appetite change, fatigue and unexpected weight change. Negative for fever.  Gastrointestinal:   Positive for abdominal pain, diarrhea, nausea and vomiting. Negative for blood in stool, constipation and rectal pain.  All other systems reviewed and are negative.    Objective:   Vital Signs Vitals:   08/13/24 1231  BP: 134/88  Pulse: 97  Temp: 98.1 F (36.7 C)  SpO2: 100%    Physical Exam Vitals and nursing note reviewed.  Constitutional:      Appearance: She is obese. She is ill-appearing.     Comments: She is tearful.  HENT:     Head: Normocephalic.  Eyes:     General: No scleral icterus. Cardiovascular:     Rate and Rhythm: Normal rate.  Pulmonary:     Effort: Pulmonary effort is normal. No respiratory distress.  Abdominal:     General: Bowel sounds are decreased. There is no distension.     Palpations: Abdomen is soft.     Tenderness: There is abdominal tenderness (exquisite) in the suprapubic area and left lower quadrant. There is guarding. There is no rebound.  Skin:    General: Skin is warm and dry.  Neurological:     Mental Status: She is alert and oriented to person, place, and time.  Psychiatric:        Behavior: Behavior is cooperative.       RECENT STUDIES:  Colonoscopy N/a EGD N/a  Imaging  CT ABDOMEN AND PELVIS WITH CONTRAST 08/09/24  TECHNIQUE:  Multidetector CT imaging of the abdomen and pelvis was performed  using the standard protocol following bolus administration of  intravenous contrast.   RADIATION DOSE REDUCTION: This exam was performed according to the  departmental dose-optimization program which includes automated  exposure control, adjustment of the mA and/or kV according to  patient size and/or use of iterative reconstruction technique.   CONTRAST:  OMNIPAQUE  IOHEXOL  300 MG/ML  SOLN   COMPARISON:  Radiographs 07/29/2024 and CT scan 07/31/2024   FINDINGS:  Lower chest: Unremarkable   Hepatobiliary: Focal steatosis in segment 4 adjacent to the  falciform ligament. This appearance is benign and no further imaging   workup is indicated. Gallbladder unremarkable.   Pancreas: Unremarkable   Spleen: Unremarkable   Adrenals/Urinary Tract: Unremarkable   Stomach/Bowel: No dilated bowel to indicate obstruction. There are  some air-fluid levels in normal caliber loops of otherwise  fluid-filled distal small bowel. Some of these loops sits adjacent  to the right ovary. Normal caliber appendix adjacent to the right  ureter on image 62 series 2.   Vascular/Lymphatic: Unremarkable   Reproductive: Uterus absent. Suspected corpus luteum in the right  ovary measuring 1.8 cm in diameter. Right ovary margins partially  obscured by adjacent fluid-filled loops of bowel. Left ovary  unremarkable.   Other: No supplemental non-categorized findings.   Musculoskeletal: Unremarkable   IMPRESSION:  1. No dilated bowel to indicate obstruction. There are some  air-fluid levels in normal caliber fluid-filled loops of distal  small bowel, which could be from a low-grade enteritis.     CT ABDOMEN AND PELVIS WITH CONTRAST 07/31/2024   TECHNIQUE: Multidetector CT imaging of the abdomen and pelvis was performed using the standard protocol following bolus administration of intravenous contrast.   RADIATION DOSE REDUCTION: This exam was performed according to the departmental dose-optimization program which includes automated exposure control, adjustment of the mA and/or kV according to patient size and/or use of iterative reconstruction technique.   CONTRAST:  100 mL iohexoL  (OMNIPAQUE ) 350 mg iodine /mL injection (MDV) 100 mL   COMPARISON:  CT abdomen pelvis dated 07/26/2024.   FINDINGS: Lower chest: The visualized lung bases are clear.   No intra-abdominal free air or free fluid.   Hepatobiliary: The liver is unremarkable. No biliary ductal dilatation. The gallbladder is unremarkable.   Pancreas: Unremarkable. No pancreatic ductal dilatation or surrounding inflammatory changes.   Spleen: Normal in  size without focal abnormality.   Adrenals/Urinary Tract: The adrenal glands are unremarkable. The kidneys, visualized ureters, and urinary bladder appear unremarkable.   Stomach/Bowel: There is no bowel obstruction or active inflammation. The appendix is normal.   Vascular/Lymphatic: The abdominal aorta and IVC are unremarkable. No portal venous gas. There is no adenopathy.   Reproductive: Hysterectomy.  No suspicious adnexal masses.   Other: None   Musculoskeletal: No acute or significant osseous findings.   IMPRESSION: No acute intra-abdominal or pelvic pathology. No bowel obstruction. Normal appendix.     Electronically Signed   By: Vanetta Chou M.D.   On: 07/31/2024 20:00   CTAP w/contrast 07/26/2024  IMPRESSION: 1. A cluster small bowel loops in the pelvis are fluid-filled and distended up to a bowel 2.4 cm maximum diameter. There is a small bowel adhesion in the right pelvis which may result in a component of small-bowel stricture or partial obstruction as distal ileal loops extending into the terminal ileum are completely decompressed. No bowel wall thickening or pneumatosis. No  intraperitoneal free fluid. 2. Appendix unremarkable. 3. No evidence for adnexal mass. Electronically Signed By: Camellia Candle M.D. On: 07/26/2024 17:14  FINDINGS:  Lower chest: No acute findings.   Hepatobiliary: No suspicious focal abnormality within the liver  parenchyma. Small area of low attenuation in the anterior liver,  adjacent to the falciform ligament, is in a characteristic location  for focal fatty deposition. There is no evidence for gallstones,  gallbladder wall thickening, or pericholecystic fluid. No  intrahepatic or extrahepatic biliary dilation.   Pancreas: No focal mass lesion. No dilatation of the main duct. No  intraparenchymal cyst. No peripancreatic edema.   Spleen: No splenomegaly. No suspicious focal mass lesion.   Adrenals/Urinary Tract: No adrenal nodule or  mass. Kidneys  unremarkable. No evidence for hydroureter. The urinary bladder  appears normal for the degree of distention.   Stomach/Bowel: Stomach is unremarkable. No gastric wall thickening.  No evidence of outlet obstruction. Duodenum is normally positioned  as is the ligament of Treitz. Proximal small bowel is nondistended.  Small bowel loops in the pelvis are fluid-filled and distended up to  a bowel 2.4 cm maximum diameter. There is clustered small bowel the  central pelvis (see axial 67/2) with a configuration raising the  question of internal hernia although this possibility seems less  likely on coronal and sagittal imaging. There is a small bowel  adhesion in the right pelvis (59/2 axial and 65/4 coronal which may  result in a component of small-bowel obstruction as distal ileal  loops extending into the terminal ileum are completely decompressed.  The appendix is best seen on coronal images and is unremarkable.   Vascular/Lymphatic: No abdominal aortic aneurysm. No abdominal  aortic atherosclerotic calcification. There is no gastrohepatic or  hepatoduodenal ligament lymphadenopathy. No retroperitoneal or  mesenteric lymphadenopathy. No pelvic sidewall lymphadenopathy.   Reproductive: Uterus surgically absent. Left ovary unremarkable.  Right ovary is position along the medial wall the cecum without a  discrete ovarian mass lesion evident.   Other: No intraperitoneal free fluid.   Musculoskeletal: No worrisome lytic or sclerotic osseous  abnormality.   IMPRESSION:  1. A cluster small bowel loops in the pelvis are fluid-filled and  distended up to a bowel 2.4 cm maximum diameter. There is a small  bowel adhesion in the right pelvis which may result in a component  of small-bowel stricture or partial obstruction as distal ileal  loops extending into the terminal ileum are completely decompressed.  No bowel wall thickening or pneumatosis. No intraperitoneal free  fluid.   2. Appendix unremarkable.  3. No evidence for adnexal mass.      M GASTRIC EMPTYING, 04/19/2024 1:56 PM   INDICATION: LUQ pain, Nausea, Left upper quadrant pain \ R10.12 Left upper quadrant pain \ R11.0 Nausea  COMPARISON: Abdominal ultrasound 02/07/2024.   TECHNIQUE: Diabetic patients were asked to report fingerstick blood glucose reading prior to the meal to ensure a level <270 mg/dL.   The patient consumed a standardized meal (eggbeaters, two pieces of white toast, jam) labeled with 1.1 mCi Tc-53m sulfur colloid. Serial imaging up to four hours was performed in the anterior and posterior projections and the geometric mean of counts was obtained.   The patient consumed 100% of the solid meal and 100% of the provided water.     FINDINGS:   Solid gastric retention percentages at approximate times with normal ranges:   0 minutes: 100%   60 minutes (normal 37-90%): 56%   120 minutes (normal 30%-60%): 26%  180 minutes (normal 10%-29%): 12%   240 minutes (normal 0%-9%): 4%     Blood glucose levels:   0 minutes: 94 mg/dL   879 minutes: 894 mg/dL   759 minutes: 73 mg/dL     IMPRESSION: Normal rate of gastric emptying at 2 and 4 hours. I independently reviewed the following labs:  Labs Heme labs: Lab Results  Component Value Date   WBC 7.80 08/13/2024   HGB 12.7 08/13/2024   HCT 37.4 08/13/2024   MCV 91.2 08/13/2024   PLT 242 08/13/2024   Lab Results  Component Value Date   VITAMINB12 527 05/06/2024   Lab Results  Component Value Date   IRON 128 12/12/2022   FERRITIN 49.0 12/12/2022    Liver labs: Lab Results  Component Value Date   ALT 16 08/13/2024   AST 18 08/13/2024   BILITOT 0.4 08/13/2024    Misc labs: Lab Results  Component Value Date   HGBA1C 5.2 02/17/2023    Results        [1] Allergies Allergen Reactions  . Prochlorperazine  Other (See Comments)    Feels like skin is crawling   . Rimegepant Sulfate  Other (See Comments)     GI intolerance  . Cefuroxime Rash  . Oxycodone -Acetaminophen  Hives and Rash  . Penicillins Rash  [2] Current Outpatient Medications  Medication Sig Dispense Refill  . ALPRAZolam  (XANAX ) 0.5 mg tablet TAKE 1 TABLET BY MOUTH DAILY AS NEEDED FOR SLEEP OR ANXIETY. 15 tablet 0  . cholecalciferol, vitamin D3, (VITAMIN D3 ORAL) Take 1 tablet by mouth daily.    . CYANOCOBALAMIN, VITAMIN B-12, ORAL Take 1 tablet by mouth daily.    . divalproex  (DEPAKOTE  DR) 500 mg 12 hr tablet 500 mg BID for 3-5 days to break a headache cycle 20 tablet 1  . DULoxetine  (CYMBALTA ) 30 mg capsule Take 1 capsule (30 mg total) by mouth daily. X one week, then two daily. 60 capsule 3  . EPINEPHrine  (EPIPEN ) 0.3 mg/0.3 mL injection syringe Inject 0.3 mg into the muscle once as needed for anaphylaxis for up to 1 dose. 1 each 1  . fremanezumab-vfrm (Ajovy Autoinjector) 225 mg/1.5 mL atIn injection Inject 1.5 mL (225 mg total) under the skin every 28 days. 1 mL 11  . gabapentin  (NEURONTIN ) 300 mg capsule TAKE 1 CAPSULE BY MOUTH 2 TIMES DAILY. 60 capsule 4  . ketorolac  (TORADOL ) 60 mg/2 mL soln Inject 2 mLs (60 mg total) into the muscle as needed (for severe headache rescue). 10 mL 2  . levocetirizine (XYZAL ) 5 mg tablet Take 5 mg by mouth every evening.    . MAGNESIUM  OXIDE ORAL Take 500 mg by mouth nightly.    . pantoprazole  (PROTONIX ) 40 mg EC tablet TAKE 1 TABLET (40 MG TOTAL) BY MOUTH DAILY. 90 tablet 3  . promethazine  (PHENERGAN ) 25 mg tablet Take 25 mg by mouth every 6 (six) hours as needed for nausea or vomiting.    . syringe with needle (BD SafetyGlide Syringe) 3 mL 22 x 1 1/2 syrg Use to inject ketorolac . 20 each 5  . tiZANidine  (ZANAFLEX ) 4 mg tablet TAKE 1 TABLET BY MOUTH EVERY 6 HOURS AS NEEDED FOR MUSCLE SPASMS (HEADACHE) 60 tablet 5  . topiramate  (TOPAMAX ) 100 mg tablet TAKE 1 TABLET BY MOUTH 2 TIMES A DAY. 60 tablet 4  . traZODone (DESYREL) 50 mg tablet Take 1 tablet (50 mg total) by mouth nightly as needed for  sleep. 30 tablet 3  . ubrogepant  (UBRELVY ) 100 mg tab  tablet Take 100mg  at onset of headache, repeat 2hr later if headache persists. Do not exceed 200mg  in 24hr. 16 tablet 6  . dicyclomine (BENTYL) 10 mg capsule Take 1 capsule (10 mg total) by mouth 4 times daily before meals and nightly as needed (abdominal pain). 120 capsule 0  . ondansetron  (ZOFRAN -ODT) 4 mg disintegrating tablet Dissolve 1 tablet (4 mg total) on tongue every 8 (eight) hours as needed for nausea or vomiting. 20 tablet 1   No current facility-administered medications for this visit.  [3] Past Medical History: Diagnosis Date  . Acute lymphadenitis   . Acute otitis media   . Acute sinusitis   . Anxiety   . Depression   . Earache   . Headache   . Migraine with aura and without status migrainosus, not intractable   . Sore throat   . Temporomandibular dysfunction syndrome   [4] Past Surgical History: Procedure Laterality Date  . HYSTERECTOMY   2019   Procedure: HYSTERECTOMY  . KNEE ARTHROSCOPY W/ LATERAL RELEASE Left 11/05/2020   Procedure: KNEE ARTHROSCOPY W/ LATERAL RELEASE;  Surgeon: Garnette Nicholaus Raman, MD;  Location: [facility];  Service: Orthopedics;  Laterality: Left;  . KNEE SURGERY     Procedure: KNEE SURGERY  . RHIZOTOMY W/ RADIOFREQUENCY ABLATION Left 03/27/2024   RHIZOTOMY FACET RADIOFREQUENCY - Left C3*-5 #1/1 with PO sedation performed by Toribio Fairy Badder, MD at [facility]  . TYMPANOSTOMY TUBE PLACEMENT     Procedure: TYMPANOSTOMY TUBE PLACEMENT  . WISDOM TOOTH EXTRACTION     Procedure: WISDOM TOOTH EXTRACTION  [5] Social History Tobacco Use  Smoking Status Never  . Passive exposure: Never  Smokeless Tobacco Never  [6] Family History Problem Relation Name Age of Onset  . Heart attack Mother    . Heart disease Mother    . Hypertension Mother    . Asthma Mother    . Depression Mother    . Lung cancer Mother  46       Small Cell  . Brain cancer Mother         [place]ets  . Bipolar  disorder Father    . Hyperlipidemia Father    . Depression Sister    . Bipolar disorder Sister    . Asthma Sister    . Asthma Brother    . Depression Brother    . Pancreatic cancer Maternal Grandmother    . Uterine cancer Maternal Grandmother    . Lung cancer Maternal Grandfather

## 2024-08-18 ENCOUNTER — Emergency Department (HOSPITAL_BASED_OUTPATIENT_CLINIC_OR_DEPARTMENT_OTHER)

## 2024-08-18 ENCOUNTER — Other Ambulatory Visit: Payer: Self-pay

## 2024-08-18 ENCOUNTER — Observation Stay (HOSPITAL_BASED_OUTPATIENT_CLINIC_OR_DEPARTMENT_OTHER)
Admission: EM | Admit: 2024-08-18 | Discharge: 2024-08-21 | Disposition: A | Discharge status: Home / Self Care | Attending: Neurology | Admitting: Neurology

## 2024-08-18 ENCOUNTER — Encounter (HOSPITAL_BASED_OUTPATIENT_CLINIC_OR_DEPARTMENT_OTHER): Payer: Self-pay

## 2024-08-18 DIAGNOSIS — F1290 Cannabis use, unspecified, uncomplicated: Secondary | ICD-10-CM | POA: Insufficient documentation

## 2024-08-18 DIAGNOSIS — Z6839 Body mass index (BMI) 39.0-39.9, adult: Secondary | ICD-10-CM | POA: Insufficient documentation

## 2024-08-18 DIAGNOSIS — F419 Anxiety disorder, unspecified: Secondary | ICD-10-CM | POA: Insufficient documentation

## 2024-08-18 DIAGNOSIS — G43909 Migraine, unspecified, not intractable, without status migrainosus: Secondary | ICD-10-CM | POA: Insufficient documentation

## 2024-08-18 DIAGNOSIS — I10 Essential (primary) hypertension: Secondary | ICD-10-CM | POA: Insufficient documentation

## 2024-08-18 DIAGNOSIS — R634 Abnormal weight loss: Secondary | ICD-10-CM

## 2024-08-18 DIAGNOSIS — M797 Fibromyalgia: Secondary | ICD-10-CM | POA: Diagnosis not present

## 2024-08-18 DIAGNOSIS — F32A Depression, unspecified: Secondary | ICD-10-CM | POA: Insufficient documentation

## 2024-08-18 DIAGNOSIS — E66812 Obesity, class 2: Secondary | ICD-10-CM | POA: Insufficient documentation

## 2024-08-18 DIAGNOSIS — R1032 Left lower quadrant pain: Secondary | ICD-10-CM | POA: Diagnosis present

## 2024-08-18 DIAGNOSIS — K219 Gastro-esophageal reflux disease without esophagitis: Principal | ICD-10-CM | POA: Insufficient documentation

## 2024-08-18 DIAGNOSIS — R110 Nausea: Secondary | ICD-10-CM

## 2024-08-18 DIAGNOSIS — R109 Unspecified abdominal pain: Principal | ICD-10-CM | POA: Diagnosis present

## 2024-08-18 LAB — COMPREHENSIVE METABOLIC PANEL WITH GFR
ALT: 17 U/L (ref 0–44)
AST: 18 U/L (ref 15–41)
Albumin: 4.4 g/dL (ref 3.5–5.0)
Alkaline Phosphatase: 61 U/L (ref 38–126)
Anion gap: 14 (ref 5–15)
BUN: 11 mg/dL (ref 6–20)
CO2: 20 mmol/L — ABNORMAL LOW (ref 22–32)
Calcium: 9.9 mg/dL (ref 8.9–10.3)
Chloride: 105 mmol/L (ref 98–111)
Creatinine, Ser: 0.95 mg/dL (ref 0.44–1.00)
GFR, Estimated: >60 mL/min (ref >60)
Glucose, Bld: 95 mg/dL (ref 70–99)
Potassium: 3.6 mmol/L (ref 3.5–5.1)
Sodium: 139 mmol/L (ref 135–145)
Total Bilirubin: 0.4 mg/dL (ref 0.0–1.2)
Total Protein: 7.4 g/dL (ref 6.5–8.1)

## 2024-08-18 LAB — URINALYSIS, ROUTINE W REFLEX MICROSCOPIC
Bilirubin Urine: NEGATIVE
Glucose, UA: NEGATIVE mg/dL
Hgb urine dipstick: NEGATIVE
Ketones, ur: 15 mg/dL — AB
Leukocytes,Ua: NEGATIVE
Nitrite: NEGATIVE
Protein, ur: NEGATIVE mg/dL
Specific Gravity, Urine: 1.006 (ref 1.005–1.030)
pH: 6.5 (ref 5.0–8.0)

## 2024-08-18 LAB — CBC
HCT: 37.4 % (ref 36.0–46.0)
Hemoglobin: 12.7 g/dL (ref 12.0–15.0)
MCH: 31.1 pg (ref 26.0–34.0)
MCHC: 34 g/dL (ref 30.0–36.0)
MCV: 91.4 fL (ref 80.0–100.0)
Platelets: 220 K/uL (ref 150–400)
RBC: 4.09 MIL/uL (ref 3.87–5.11)
RDW: 13.2 % (ref 11.5–15.5)
WBC: 5 K/uL (ref 4.0–10.5)
nRBC: 0 % (ref 0.0–0.2)

## 2024-08-18 LAB — LIPASE, BLOOD: Lipase: 42 U/L (ref 11–51)

## 2024-08-18 MED ORDER — MORPHINE SULFATE (PF) 4 MG/ML IV SOLN: 4.0000 mg | Freq: Once | INTRAVENOUS | Status: DC

## 2024-08-18 MED ORDER — METOCLOPRAMIDE HCL 5 MG/ML IJ SOLN
5.0000 mg | Freq: Once | INTRAMUSCULAR | Status: AC
  Administered 2024-08-18: 5 mg via INTRAVENOUS
  Filled 2024-08-18: qty 2

## 2024-08-18 MED ORDER — KETOROLAC TROMETHAMINE 15 MG/ML IJ SOLN
15.0000 mg | Freq: Three times a day (TID) | INTRAMUSCULAR | Status: AC | PRN
  Administered 2024-08-18: 15 mg via INTRAVENOUS
  Filled 2024-08-18: qty 1

## 2024-08-18 MED ORDER — DIPHENHYDRAMINE HCL 50 MG/ML IJ SOLN
12.5000 mg | Freq: Once | INTRAMUSCULAR | Status: AC
  Administered 2024-08-18: 12.5 mg via INTRAVENOUS
  Filled 2024-08-18: qty 1

## 2024-08-18 MED ORDER — MORPHINE SULFATE (PF) 4 MG/ML IV SOLN
4.0000 mg | Freq: Once | INTRAVENOUS | Status: AC
  Administered 2024-08-18: 4 mg via INTRAVENOUS
  Filled 2024-08-18: qty 1

## 2024-08-18 MED ORDER — ONDANSETRON HCL 4 MG/2ML IJ SOLN
4.0000 mg | Freq: Four times a day (QID) | INTRAMUSCULAR | Status: DC | PRN
  Administered 2024-08-18 – 2024-08-20 (×3): 4 mg via INTRAVENOUS
  Filled 2024-08-18 (×4): qty 2

## 2024-08-18 MED ORDER — LACTATED RINGERS IV SOLN: INTRAVENOUS | Status: AC

## 2024-08-18 MED ORDER — SIMETHICONE 80 MG PO CHEW
80.0000 mg | CHEWABLE_TABLET | Freq: Four times a day (QID) | ORAL | Status: DC
  Filled 2024-08-18: qty 1

## 2024-08-18 MED ORDER — TOPIRAMATE 25 MG PO TABS
100.0000 mg | ORAL_TABLET | Freq: Two times a day (BID) | ORAL | Status: DC
  Administered 2024-08-19 – 2024-08-21 (×5): 100 mg via ORAL
  Filled 2024-08-18 (×2): qty 1
  Filled 2024-08-18: qty 4
  Filled 2024-08-18 (×3): qty 1

## 2024-08-18 MED ORDER — HYDROMORPHONE HCL 1 MG/ML IJ SOLN
0.5000 mg | INTRAMUSCULAR | Status: DC | PRN
  Administered 2024-08-18: 0.5 mg via INTRAVENOUS
  Filled 2024-08-18: qty 1

## 2024-08-18 MED ORDER — MAGNESIUM OXIDE -MG SUPPLEMENT 400 (240 MG) MG PO TABS
400.0000 mg | ORAL_TABLET | Freq: Every day | ORAL | Status: DC
  Administered 2024-08-19 – 2024-08-20 (×2): 400 mg via ORAL
  Filled 2024-08-18 (×2): qty 1

## 2024-08-18 MED ORDER — ONDANSETRON HCL 4 MG/2ML IJ SOLN
4.0000 mg | Freq: Once | INTRAMUSCULAR | Status: AC
  Administered 2024-08-18: 4 mg via INTRAVENOUS
  Filled 2024-08-18: qty 2

## 2024-08-18 MED ORDER — DIPHENHYDRAMINE HCL 50 MG/ML IJ SOLN: 25.0000 mg | Freq: Once | INTRAMUSCULAR | Status: AC

## 2024-08-18 MED ORDER — DIVALPROEX SODIUM 250 MG PO DR TAB
500.0000 mg | DELAYED_RELEASE_TABLET | Freq: Two times a day (BID) | ORAL | Status: DC | PRN
Start: 2024-08-19 — End: 2024-08-19

## 2024-08-18 MED ORDER — GABAPENTIN 300 MG PO CAPS
300.0000 mg | ORAL_CAPSULE | Freq: Two times a day (BID) | ORAL | Status: DC
Start: 2024-08-18 — End: 2024-08-19
  Administered 2024-08-18: 300 mg via ORAL
  Filled 2024-08-18: qty 1

## 2024-08-18 MED ORDER — KETOROLAC TROMETHAMINE 30 MG/ML IJ SOLN
30.0000 mg | Freq: Once | INTRAMUSCULAR | Status: AC
  Administered 2024-08-18: 30 mg via INTRAVENOUS
  Filled 2024-08-18: qty 1

## 2024-08-18 MED ORDER — DIPHENHYDRAMINE HCL 50 MG/ML IJ SOLN
INTRAMUSCULAR | Status: AC
  Administered 2024-08-19: 25 mg via INTRAVENOUS
  Filled 2024-08-18: qty 1

## 2024-08-18 MED ORDER — ONDANSETRON HCL 4 MG/2ML IJ SOLN
4.0000 mg | Freq: Once | INTRAMUSCULAR | Status: AC
Start: 2024-08-18 — End: 2024-08-18
  Administered 2024-08-18: 4 mg via INTRAVENOUS
  Filled 2024-08-18: qty 2

## 2024-08-18 MED ORDER — IOHEXOL 350 MG/ML SOLN
100.0000 mL | Freq: Once | INTRAVENOUS | Status: AC | PRN
Start: 2024-08-18 — End: 2024-08-18
  Administered 2024-08-18: 100 mL via INTRAVENOUS

## 2024-08-18 MED ORDER — DULOXETINE HCL 20 MG PO CPEP
40.0000 mg | ORAL_CAPSULE | Freq: Every day | ORAL | Status: DC
  Administered 2024-08-19 – 2024-08-21 (×3): 40 mg via ORAL
  Filled 2024-08-18 (×3): qty 2

## 2024-08-18 MED ORDER — PANTOPRAZOLE SODIUM 40 MG PO TBEC
40.0000 mg | DELAYED_RELEASE_TABLET | Freq: Every day | ORAL | Status: DC
Start: 2024-08-19 — End: 2024-08-19
  Administered 2024-08-19: 40 mg via ORAL
  Filled 2024-08-18: qty 1

## 2024-08-18 NOTE — ED Notes (Signed)
 Patient transported to CT

## 2024-08-18 NOTE — ED Triage Notes (Addendum)
 Patient arrives POV with complaints of worsening nausea and left side abdominal pain for 1 week. Patient states that she is recovering from having a small bowel obstruction. Patient states that she was seen last week for similar symptoms and by her PCP. She feels bloated and she has been taking Miralax . Pain has worsened.

## 2024-08-18 NOTE — ED Provider Notes (Signed)
 [place] EMERGENCY DEPARTMENT AT [facility] Provider Note   CSN: [id number] Arrival date & time: 08/18/24  1036     Patient presents with: Abdominal Pain   [name] Sanchez is a 28 y.o. female.   HPI     28yo female with history of chronic migraine, depression, chronic myofascial pain, cervicogenic headache, spondylosis of cervical region, admission to hospital with concern for partial SBO 8.22-8/26 who presents with concern for worsening abdominal pain.   Partial SBO Aug 22, since leaving 8/26 has had continuing pain Worsening pain LLQ and has bulging area to LUQ that hurts, feels twisted, knotted. Low back hurts, when sit in chair legs go numb. Difficult to urinate.  Feels bloated, distended.   No dysuria but does have back pain with urination.  Low grade fever Friday, 100.8, no fever today Nausea, no vomiting, last night close Diarrhea, small amounts yesterday 3 in total No vaginal bleeding or discharge, has hysterectomy. Saw GI on Tuesday, couldn't touch the left side because pain so severe.  Did a CT on Tuesday. CT showed colonic stool burden, take miralax  and if not better to go to the ED.  Then call the office Monday. Last real meal was when discharged.  Clear liquids.  When eat has nausea and pain on left side.  After the diarrhea stomach pain is worse too.  No black or bloody stools, black specks    Per records, had bee seen in May by [facility] for LUQ abdominal pain. Had NM gastric emptying study 5/16 that was showed normal emptying  Was admitted 8/22-8/26 for partial SBO, had surgery consult.   Since her discharge 8/26 has returned to the ED twice and had PCP and GI visits for abdominal pain   Had CT completed 9/5, and again 9/10  Past Medical History:  Diagnosis Date   Anxiety    Complication of anesthesia    wakes up crying   Depression    Endometriosis    Family history of adverse reaction to anesthesia    hard to wake up,  low heart rate   Gestational hypertension 01/2012   also had postpartum preelampsia   History of stomach ulcers    Hx of ovarian cyst    x 2-3   Hx of varicella    IBS (irritable bowel syndrome)    Migraine    chronic   Panic attack    Pinched nerve    left arm   Plica of knee 08/2012   medial plica left knee   SBO (small bowel obstruction) (HCC)     Past Surgical History:  Procedure Laterality Date   KNEE ARTHROSCOPY  08/17/2012   Procedure: ARTHROSCOPY KNEE;  Surgeon: Norleen LITTIE Gavel, MD;  Location: [facility];  Service: Orthopedics;  Laterality: Left;  left knee scope with tight lateral retinacular release and plica removal   TYMPANOSTOMY TUBE PLACEMENT Bilateral 1998   VAGINAL HYSTERECTOMY Bilateral 08/14/2018   Procedure: HYSTERECTOMY VAGINAL WITH SALPINGECTOMY;  Surgeon: Fredirick Glenys RAMAN, MD;  Location: [facility];  Service: Gynecology;  Laterality: Bilateral;    Prior to Admission medications   Medication Sig Start Date End Date Taking? Authorizing Provider  acetaminophen  (TYLENOL ) 500 MG tablet Take 1,000 mg by mouth 2 (two) times daily as needed for moderate pain (pain score 4-6), fever or headache.    [provider]  AJOVY 225 MG/1.5ML SOAJ Inject 225 mg into the skin every 28 (twenty-eight) days. 06/12/24   [provider]  ALPRAZolam  (XANAX ) 0.5 MG tablet Take 1 tablet (0.5 mg total) by mouth daily as needed for anxiety. 11/12/18   Danford, Rockie D, NP  divalproex  (DEPAKOTE ) 500 MG DR tablet Take 500 mg by mouth See admin instructions. Take 1 tablet (500mg ) by mouth for 3-5 days as needed to break headache cycle.    [provider]  DULoxetine  40 MG CPEP Take 1 capsule (40 mg total) by mouth daily. 07/31/24   Tobie Yetta HERO, MD  gabapentin  (NEURONTIN ) 300 MG capsule Take 300 mg by mouth 2 (two) times daily. 12/27/22   [provider]  ibuprofen  (ADVIL ) 200 MG tablet Take 400 mg by mouth 2 (two) times daily as needed for headache or  moderate pain (pain score 4-6).    [provider]  ketorolac  (TORADOL ) 60 MG/2ML SOLN injection Inject 60 mg into the muscle once as needed (severe migraine).    [provider]  levocetirizine (XYZAL ) 5 MG tablet Take 5 mg by mouth at bedtime.    [provider]  MAGNESIUM  PO Take 2 tablets by mouth at bedtime.    [provider]  pantoprazole  (PROTONIX ) 40 MG tablet Take 40 mg by mouth daily. 01/25/23   [provider]  polyethylene glycol (MIRALAX ) 17 g packet Take 17 g by mouth daily as needed. 07/30/24   Tobie Yetta HERO, MD  promethazine  (PHENERGAN ) 12.5 MG tablet Take 1 tablet (12.5 mg total) by mouth every 6 (six) hours as needed for nausea or vomiting. 08/03/21   Ines Onetha NOVAK, MD  simethicone  (MYLICON) 80 MG chewable tablet Chew 1 tablet (80 mg total) by mouth 4 (four) times daily. 07/30/24   Tobie Yetta HERO, MD  tiZANidine  (ZANAFLEX ) 4 MG tablet Take 1 tablet (4 mg total) by mouth every 6 (six) hours as needed for muscle spasms. Or headaches. 08/03/21   Ines Onetha NOVAK, MD  topiramate  (TOPAMAX ) 100 MG tablet Take 100 mg by mouth 2 (two) times daily. 07/01/24   [provider]  Ubrogepant  (UBRELVY ) 100 MG TABS Take 100 mg by mouth every 2 (two) hours as needed. 08/03/21   Ines Onetha NOVAK, MD    Allergies: Compazine  [prochlorperazine ], Nurtec [rimegepant sulfate ], Percocet [oxycodone -acetaminophen ], Ceftin [cefuroxime], and Penicillins    Review of Systems  Updated Vital Signs BP 106/72   Pulse 93   Temp 98.6 F (37 C) (Oral)   Resp 18   Ht 5' 4 (1.626 m)   Wt 103.4 kg   LMP 07/15/2018 (Exact Date)   SpO2 96%   BMI 39.13 kg/m   Physical Exam Vitals and nursing note reviewed.  Constitutional:      General: She is not in acute distress.    Appearance: She is well-developed. She is not diaphoretic.  HENT:     Head: Normocephalic and atraumatic.  Eyes:     Conjunctiva/sclera: Conjunctivae normal.  Cardiovascular:      Rate and Rhythm: Normal rate and regular rhythm.     Heart sounds: Normal heart sounds.  Pulmonary:     Effort: Pulmonary effort is normal. No respiratory distress.     Breath sounds: Normal breath sounds. No wheezing or rales.  Abdominal:     General: There is no distension.     Palpations: Abdomen is soft.     Tenderness: There is abdominal tenderness in the right lower quadrant, suprapubic area, left upper quadrant and left lower quadrant. There is no guarding. Positive signs include McBurney's sign. Negative signs include Murphy's  sign.  Musculoskeletal:        General: No tenderness.     Cervical back: Normal range of motion.  Skin:    General: Skin is warm and dry.     Findings: No erythema or rash.  Neurological:     Mental Status: She is alert and oriented to person, place, and time.     (all labs ordered are listed, but only abnormal results are displayed) Labs Reviewed  COMPREHENSIVE METABOLIC PANEL WITH GFR - Abnormal; Notable for the following components:      Result Value   CO2 20 (*)    All other components within normal limits  URINALYSIS, ROUTINE W REFLEX MICROSCOPIC - Abnormal; Notable for the following components:   Color, Urine COLORLESS (*)    Ketones, ur 15 (*)    All other components within normal limits  LIPASE, BLOOD  CBC    EKG: None  Radiology: US  PELVIC COMPLETE W TRANSVAGINAL AND TORSION R/O Result Date: 08/18/2024 CLINICAL DATA:  Nausea and left lower quadrant abdominal pain. CT today showed a 0.3 cm right adnexal lesion. Prior vaginal hysterectomy and bilateral salpingectomy. EXAM: TRANSABDOMINAL AND TRANSVAGINAL ULTRASOUND OF PELVIS DOPPLER ULTRASOUND OF OVARIES TECHNIQUE: Both transabdominal and transvaginal ultrasound examinations of the pelvis were performed. Transabdominal technique was performed for global imaging of the pelvis including uterus, ovaries, adnexal regions, and pelvic cul-de-sac. It was necessary to proceed with endovaginal exam  following the transabdominal exam to visualize the ovaries. Color and duplex Doppler ultrasound was utilized to evaluate blood flow to the ovaries. COMPARISON:  CT scan 08/18/2024 FINDINGS: Uterus Surgically absent Endometrium N/A Right ovary Measurements: 3.1 by 1.8 by 2.2 cm = volume: 6.6 mL. Normal appearance/no adnexal mass. Left ovary Measurements: 2.9 by 1.4 by 2.0 cm = volume: 4.4 mL. Normal appearance/no adnexal mass. Pulsed Doppler evaluation of both ovaries demonstrates normal low-resistance arterial and venous waveforms in the right ovary. Color Doppler in venous flow could be seen in the left ovary but arterial flow was not well as stab list due to artifact from adjacent bowel. Other findings No abnormal free fluid. IMPRESSION: 1. The ovaries appear normal in size and appearance. No adnexal mass identified. 2. Arterial and venous flow is identified in the right ovary. Venous flow is identified in the left ovary but arterial flow was not well seen due to artifact from adjacent bowel. 3. Based on retrospective review in the context of sonographic findings, the area of concern along the right adnexa raised on prior CT scan is thought to represent normal cecum. 4. Uterus absent. Electronically Signed   By: Ryan Salvage M.D.   On: 08/18/2024 17:26   CT Angio Abd/Pel W and/or Wo Contrast Result Date: 08/18/2024 CLINICAL DATA:  Acute mesenteric ischemia. EXAM: CTA ABDOMEN AND PELVIS WITHOUT AND WITH CONTRAST TECHNIQUE: Multidetector CT imaging of the abdomen and pelvis was performed using the standard protocol during bolus administration of intravenous contrast. Multiplanar reconstructed images and MIPs were obtained and reviewed to evaluate the vascular anatomy. RADIATION DOSE REDUCTION: This exam was performed according to the departmental dose-optimization program which includes automated exposure control, adjustment of the mA and/or kV according to patient size and/or use of iterative reconstruction  technique. CONTRAST:  OMNIPAQUE  IOHEXOL  350 MG/ML SOLN COMPARISON:  CT abdomen and pelvis 08/09/2024. FINDINGS: VASCULAR Aorta: Normal caliber aorta without aneurysm, dissection, vasculitis or significant stenosis. Celiac: Patent without evidence of aneurysm, dissection, vasculitis or significant stenosis. SMA: Patent without evidence of aneurysm, dissection, vasculitis or  significant stenosis. Renals: Both renal arteries are patent without evidence of aneurysm, dissection, vasculitis, fibromuscular dysplasia or significant stenosis. IMA: Patent without evidence of aneurysm, dissection, vasculitis or significant stenosis. Inflow: Patent without evidence of aneurysm, dissection, vasculitis or significant stenosis. Proximal Outflow: Bilateral common femoral and visualized portions of the superficial and profunda femoral arteries are patent without evidence of aneurysm, dissection, vasculitis or significant stenosis. Veins: No obvious venous abnormality within the limitations of this arterial phase study. The portal venous system including the superior mesenteric vein appear grossly patent. Review of the MIP images confirms the above findings. NON-VASCULAR Lower chest: No acute abnormality. Hepatobiliary: No focal liver abnormality is seen. No gallstones, gallbladder wall thickening, or biliary dilatation. Pancreas: Unremarkable. No pancreatic ductal dilatation or surrounding inflammatory changes. Spleen: Normal in size without focal abnormality. Adrenals/Urinary Tract: Adrenal glands are unremarkable. Kidneys are normal, without renal calculi, focal lesion, or hydronephrosis. Bladder is unremarkable. Stomach/Bowel: Stomach is within normal limits. Appendix appears normal. No evidence of bowel wall thickening, distention, or inflammatory changes. No portal venous gas. Lymphatic: No enlarged lymph nodes are seen. Reproductive: The uterus is surgically absent. Left ovary is within normal limits. There is a 4.3 by  3.7 cm low-attenuation area in the right adnexa, possibly paraovarian cyst. Separate right ovary seen posterior to this and appears normal in size. Other: No abdominal wall hernia or abnormality. No abdominopelvic ascites. Musculoskeletal: No acute or significant osseous findings. IMPRESSION: 1. No evidence for mesenteric ischemia. 2. No acute localizing process in the abdomen or pelvis. 3. 4.3 cm low-attenuation area in the right adnexa, possibly paraovarian cyst. Recommend further evaluation with pelvic ultrasound. Electronically Signed   By: Greig Pique M.D.   On: 08/18/2024 15:20   CT L-SPINE NO CHARGE Result Date: 08/18/2024 EXAM: CT OF THE LUMBAR SPINE WITHOUT CONTRAST 08/18/2024 02:21:16 PM TECHNIQUE: CT of the lumbar spine was performed without the administration of intravenous contrast. Multiplanar reformatted images are provided for review. Automated exposure control, iterative reconstruction, and/or weight based adjustment of the mA/kV was utilized to reduce the radiation dose to as low as reasonably achievable. COMPARISON: Lumbar spine radiographs 11/30/2009. CLINICAL HISTORY: Left-sided abdominal pain. FINDINGS: BONES AND ALIGNMENT: Normal vertebral body heights. No acute fracture or suspicious bone lesion. Normal alignment. DEGENERATIVE CHANGES: Moderate facet hypertrophy is present on the left at L5 to S1. No significant disc protrusion or stenosis is present. SOFT TISSUES: No acute abnormality. IMPRESSION: 1. No acute findings. 2. Moderate facet hypertrophy on the left at L5-S1 without focal stenosis Electronically signed by: Lonni Necessary MD 08/18/2024 03:12 PM EDT RP Workstation: [id number]     Procedures   Medications Ordered in the ED  morphine  (PF) 4 MG/ML injection 4 mg (4 mg Intravenous Given 08/18/24 1234)  ondansetron  (ZOFRAN ) injection 4 mg (4 mg Intravenous Given 08/18/24 1234)  morphine  (PF) 4 MG/ML injection 4 mg (4 mg Intravenous Given 08/18/24 1403)  iohexol   (OMNIPAQUE ) 350 MG/ML injection 100 mL (100 mLs Intravenous Contrast Given 08/18/24 1355)  ondansetron  (ZOFRAN ) injection 4 mg (4 mg Intravenous Given 08/18/24 1554)  ketorolac  (TORADOL ) 30 MG/ML injection 30 mg (30 mg Intravenous Given 08/18/24 1723)  morphine  (PF) 4 MG/ML injection 4 mg (4 mg Intravenous Given 08/18/24 1724)  metoCLOPramide  (REGLAN ) injection 5 mg (5 mg Intravenous Given 08/18/24 1931)  diphenhydrAMINE  (BENADRYL ) injection 12.5 mg (12.5 mg Intravenous Given 08/18/24 1931)  27yo female with history of chronic migraine, depression, chronic myofascial pain, cervicogenic headache, spondylosis of cervical region, admission to hospital with concern for partial SBO 8.22-8/26 who presents with concern for worsening abdominal pain.   DDx includes appendicitis, pancreatitis, cholecystitis, pyelonephritis, nephrolithiasis, diverticulitis, SBO, gastroparesis, gastritis, PUD, mesenteric ischemia, chronic abdominal pain, abdominal migraine, PID, ovarian torsion, and tuboovarian abscess.  Labs completed and evaluated by me show no sign of urinary tract infection, pancreatitis, hepatitis, no clinically significant electrolyte abnormalities, normal renal function, CBC without leukocytosis or anemia.  CT abdomen pelvis completed given severe abdominal pain and tenderness as CTA given pain after eating, concern for possible chronic mesenteric ischemia as contributing to symptoms, (and also in the setting of prior normal CT abdomen pelvis on September 10 and September 5.)  Also included CT lumbar reformats given accompanying lower back pain.    CT L spine shows face hypertrophy without significant disc protrusion or stenosis.  Do not suspect emergent pathology such as cauda equina, epidural abscess, osteomyelitis. Has normal bilateral LE pulses, doubt acute arterial thrombus. Back pain may be secondary to other muscular pain, facet hypertrophy.  CT abdomen pelvis  shows no evidence of mesenteric ischemia or other acute localizing process in abdomen. Does hve right adnexal abnormality/cyst. Given this will obtain TVUS to evaluate for ovarian torsion on the right side.  Signed out to Dr. Ruthe with US  pending.        Final diagnoses:  Abdominal pain, unspecified abdominal location  Nausea    ED Discharge Orders     None          Dreama Longs, MD 08/18/24 2118

## 2024-08-18 NOTE — Plan of Care (Addendum)
 [facility] emergency department to [facility] transfer:  28 year old female prior history of IBS, migraine, depression, chronic myofascial pain, spondylosis of the cervical region, hysterectomy, small bowel obstruction in August 2025 and recently admitted to [facility] with left lower quadrant abdominal pain with associated nausea vomiting diarrhea. Patient has been presented to emergency department complaining of generalized abdominal pain more severe on the left lower quadrant with associated nausea vomiting.   At presentation to ED patient is hemodynamically stable.  Afebrile.  CBC unremarkable evidence of leukocytosis.  CMP unremarkable.  Normal lipase level.  UA no evidence of UTI.   CT angio abdomen pelvis no acute abnormality.  Except ovarian cyst. IMPRESSION: 1. No evidence for mesenteric ischemia. 2. No acute localizing process in the abdomen or pelvis. 3. 4.3 cm low-attenuation area in the right adnexa, possibly paraovarian cyst. Recommend further evaluation with pelvic ultrasound.  CT lumbar spine no acute finding.Moderate facet hypertrophy on the left at L5-S1 without focal stenosis.  Pelvic ultrasound: Unable to identify any specific acute problem. IMPRESSION: 1. The ovaries appear normal in size and appearance. No adnexal mass identified. 2. Arterial and venous flow is identified in the right ovary. Venous flow is identified in the left ovary but arterial flow was not well seen due to artifact from adjacent bowel. 3. Based on retrospective review in the context of sonographic findings, the area of concern along the right adnexa raised on prior CT scan is thought to represent normal cecum. 4. Uterus absent.    In the ED patient received morphine  multiple doses, Zofran , Toradol , Benadryl  and Reglan .   Given ultrasound abdomen unable to rule out ovarian torsion OB/GYN recommended admit to [facility] and upon arrival to the hospital they will evaluate the patient.  It is  unclear if patient has chronic pain from underlying scar tissue or not.  Hospitalist consulted for further evaluation for abdominal pain with associated nausea vomiting.  Braeson Rupe, MD [facility] 08/18/2024, 9:26 PM

## 2024-08-18 NOTE — ED Provider Notes (Addendum)
 Overall ultrasound of her pelvis is equivocal for torsion as unable to get good Doppler flow in the left ovary.  She has had prior hysterectomy.  She still a lot of discomfort here today despite multiple rounds of IV pain medication.  She does not have large cyst in this area.  Is not having any discharge or concern for STD.  I talked with Dr. Nicholaus with OB/GYN regarding the study and the patient.  We thought to be reasonable to admit her to medicine for pain control and further evaluation.  She has had prior hysterectomy.  At this time I think pain management is reasonable and have OB evaluate her when she arrives to [facility]. She has had prior hysterectomy. Right now pain could be related to scar tissue pain/abdominal process which she has a history of.  She had a recent bowel obstruction.  But CT imaging today is unremarkable otherwise.  She still feeling very nauseous as well.  Not really able to tolerate p.o. very well.  Will admit her for further pain management nausea management.  Patient admitted to medicine in stable condition.  This chart was dictated using voice recognition software.  Despite best efforts to proofread,  errors can occur which can change the documentation meaning.    Ruthe Cornet, DO 08/18/24 1909    Ruthe Cornet, DO 08/18/24 2301

## 2024-08-19 DIAGNOSIS — R102 Pelvic and perineal pain: Secondary | ICD-10-CM

## 2024-08-19 DIAGNOSIS — R109 Unspecified abdominal pain: Secondary | ICD-10-CM | POA: Diagnosis not present

## 2024-08-19 LAB — CREATININE, SERUM
Creatinine, Ser: 0.93 mg/dL (ref 0.44–1.00)
GFR, Estimated: >60 mL/min (ref >60)

## 2024-08-19 LAB — CBC
HCT: 36.5 % (ref 36.0–46.0)
Hemoglobin: 12.2 g/dL (ref 12.0–15.0)
MCH: 31.4 pg (ref 26.0–34.0)
MCHC: 33.4 g/dL (ref 30.0–36.0)
MCV: 94.1 fL (ref 80.0–100.0)
Platelets: 209 K/uL (ref 150–400)
RBC: 3.88 MIL/uL (ref 3.87–5.11)
RDW: 13.3 % (ref 11.5–15.5)
WBC: 4.5 K/uL (ref 4.0–10.5)
nRBC: 0 % (ref 0.0–0.2)

## 2024-08-19 LAB — RAPID URINE DRUG SCREEN, HOSP PERFORMED
Amphetamines: NOT DETECTED
Barbiturates: NOT DETECTED
Benzodiazepines: NOT DETECTED
Cocaine: NOT DETECTED
Opiates: POSITIVE — AB
Tetrahydrocannabinol: POSITIVE — AB

## 2024-08-19 LAB — LIPASE, BLOOD: Lipase: 48 U/L (ref 11–51)

## 2024-08-19 MED ORDER — ONDANSETRON HCL 4 MG/2ML IJ SOLN
4.0000 mg | Freq: Once | INTRAMUSCULAR | Status: AC
  Administered 2024-08-19: 4 mg via INTRAVENOUS

## 2024-08-19 MED ORDER — ENSURE PLUS HIGH PROTEIN PO LIQD
237.0000 mL | Freq: Two times a day (BID) | ORAL | Status: DC
  Administered 2024-08-19 – 2024-08-21 (×5): 237 mL via ORAL

## 2024-08-19 MED ORDER — GABAPENTIN 300 MG PO CAPS
300.0000 mg | ORAL_CAPSULE | Freq: Three times a day (TID) | ORAL | Status: DC
  Administered 2024-08-19 – 2024-08-21 (×7): 300 mg via ORAL
  Filled 2024-08-19 (×7): qty 1

## 2024-08-19 MED ORDER — FAMOTIDINE 20 MG PO TABS
40.0000 mg | ORAL_TABLET | Freq: Every day | ORAL | Status: DC
  Administered 2024-08-19 – 2024-08-21 (×3): 40 mg via ORAL
  Filled 2024-08-19 (×3): qty 2

## 2024-08-19 MED ORDER — LORATADINE 10 MG PO TABS
10.0000 mg | ORAL_TABLET | Freq: Every day | ORAL | Status: DC
  Administered 2024-08-19 – 2024-08-20 (×2): 10 mg via ORAL
  Filled 2024-08-19 (×2): qty 1

## 2024-08-19 MED ORDER — HYDROMORPHONE HCL 1 MG/ML IJ SOLN
0.5000 mg | INTRAMUSCULAR | Status: DC | PRN
  Administered 2024-08-19: 1 mg via INTRAVENOUS

## 2024-08-19 MED ORDER — SUMATRIPTAN 20 MG/ACT NA SOLN
20.0000 mg | Freq: Once | NASAL | Status: DC
Start: 2024-08-19 — End: 2024-08-21

## 2024-08-19 MED ORDER — ONDANSETRON HCL 4 MG/2ML IJ SOLN
4.0000 mg | Freq: Once | INTRAMUSCULAR | Status: DC
  Filled 2024-08-19: qty 2

## 2024-08-19 MED ORDER — METHOCARBAMOL 500 MG PO TABS
500.0000 mg | ORAL_TABLET | Freq: Three times a day (TID) | ORAL | Status: DC
Start: 2024-08-19 — End: 2024-08-21
  Administered 2024-08-19 – 2024-08-21 (×7): 500 mg via ORAL
  Filled 2024-08-19 (×7): qty 1

## 2024-08-19 MED ORDER — ENOXAPARIN SODIUM 40 MG/0.4ML IJ SOSY
40.0000 mg | PREFILLED_SYRINGE | INTRAMUSCULAR | Status: DC
  Filled 2024-08-19: qty 0.4

## 2024-08-19 MED ORDER — ACETAMINOPHEN 500 MG PO TABS
1000.0000 mg | ORAL_TABLET | Freq: Three times a day (TID) | ORAL | Status: DC
Start: 2024-08-19 — End: 2024-08-21
  Administered 2024-08-19 – 2024-08-21 (×7): 1000 mg via ORAL
  Filled 2024-08-19 (×7): qty 2

## 2024-08-19 MED ORDER — ALUM & MAG HYDROXIDE-SIMETH 200-200-20 MG/5ML PO SUSP: 30.0000 mL | ORAL | Status: DC | PRN

## 2024-08-19 MED ORDER — OXYCODONE HCL 5 MG PO TABS
5.0000 mg | ORAL_TABLET | ORAL | Status: DC | PRN
  Administered 2024-08-19 – 2024-08-20 (×2): 5 mg via ORAL
  Filled 2024-08-19 (×2): qty 1

## 2024-08-19 MED ORDER — HYDROMORPHONE HCL 1 MG/ML IJ SOLN
1.0000 mg | Freq: Once | INTRAMUSCULAR | Status: DC
  Filled 2024-08-19: qty 1

## 2024-08-19 MED ORDER — POLYETHYLENE GLYCOL 3350 17 G PO PACK
17.0000 g | PACK | Freq: Every day | ORAL | Status: DC
  Administered 2024-08-19 – 2024-08-20 (×2): 17 g via ORAL
  Filled 2024-08-19 (×3): qty 1

## 2024-08-19 NOTE — Progress Notes (Signed)
 MD Zina (OB/Gyn) contacted regarding arrival of this pt onto [facility] per MD Sundil order. Upon arrival pt stated she has tried twice to urinate and is unable to do so.  Will bladder scan her.

## 2024-08-19 NOTE — H&P (Addendum)
 History and Physical    Patient: [name] Sanchez FMW:[id number] DOB: Sep 05, 1996 DOA: 08/18/2024 DOS: the patient was seen and examined on 08/19/2024 PCP: [name] Santana CROME, NP  Patient coming from: Home  Chief Complaint:  Chief Complaint  Patient presents with   Abdominal Pain   HPI: [name] Sanchez is a 28 y.o. female with medical history significant of anxiety/depression, endometriosis, migraines and recent admission in 07/2024 for SBO that resolved with conservative mgt (NGT and IVFs) p/w recurrent abd pain.    The patient reported experiencing severe abdominal pain that has persisted since the weekend before returning to school. The episode began following a diagnosis of a partial small bowel obstruction around July 26, 2024. The patient was previously hospitalized, treated with IVF and a NG tube, and discharged on July 30, 2024. Despite treatment, the pain has not resolved and was rated as 10 out of 10 over the weekend, now reduced to 7-8 out of 10 due to pain medication. The cause of the obstruction was not identified, but there is a suspicion it may be related to previous surgeries, although the patient has never had abdominal surgery vs IBS. The patient was recently evaluated by a GI doctor but no clear medical condition was identified to explain her abdominal pain and pt was prescribed Bentyl prn.  In OSH ED, pt AFVSS. Labs unremarkable. CTA abdomen showed No evidence for mesenteric ischemia, and no acute localizing process in the abdomen or pelvis, but was notable for 4.3 cm low-attenuation area in the right adnexa, possibly paraovarian cyst. US  pelvis showed nl R ovary, which was concerning on prior CT abd. [facility]SH EDP consulted [facility] for transfer for ongoing abd pain and pt accepted as transfer overnight.   Review of Systems: As mentioned in the history of present illness. All other systems reviewed and are negative. Past Medical History:  Diagnosis Date   Anxiety    Complication of  anesthesia    wakes up crying   Depression    Endometriosis    Family history of adverse reaction to anesthesia    hard to wake up, low heart rate   Gestational hypertension 01/2012   also had postpartum preelampsia   History of stomach ulcers    Hx of ovarian cyst    x 2-3   Hx of varicella    IBS (irritable bowel syndrome)    Migraine    chronic   Panic attack    Pinched nerve    left arm   Plica of knee 08/2012   medial plica left knee   SBO (small bowel obstruction) (HCC)    Past Surgical History:  Procedure Laterality Date   KNEE ARTHROSCOPY  08/17/2012   Procedure: ARTHROSCOPY KNEE;  Surgeon: Norleen Sanchez Gavel, MD;  Location: [facility];  Service: Orthopedics;  Laterality: Left;  left knee scope with tight lateral retinacular release and plica removal   TYMPANOSTOMY TUBE PLACEMENT Bilateral 1998   VAGINAL HYSTERECTOMY Bilateral 08/14/2018   Procedure: HYSTERECTOMY VAGINAL WITH SALPINGECTOMY;  Surgeon: Fredirick Glenys RAMAN, MD;  Location: [facility];  Service: Gynecology;  Laterality: Bilateral;   Social History:  reports that she has never smoked. She has never used smokeless tobacco. She reports that she does not currently use alcohol. She reports that she does not use drugs.  Allergies  Allergen Reactions   Compazine  [Prochlorperazine ] Itching   Nurtec [Rimegepant Sulfate ] Other (See Comments)    GI intolerance    Percocet [Oxycodone -Acetaminophen ] Hives and Itching  Ceftin [Cefuroxime] Rash   Penicillins Rash    Family History  Problem Relation Age of Onset   Hypertension Mother    Heart disease Mother        MI   Anesthesia problems Mother        post-op N/V   Depression Mother    Anxiety disorder Mother    Obesity Mother    Congestive Heart Failure Mother    Heart disease Father    Bipolar disorder Father    Hyperlipidemia Father    Asthma Sister    Bipolar disorder Sister    Asthma Brother    Diabetes Maternal Aunt    Uterine cancer Maternal  Grandmother    Pancreatic cancer Maternal Grandmother    Lung cancer Maternal Grandfather    Migraines Neg Hx     Prior to Admission medications   Medication Sig Start Date End Date Taking? Authorizing Provider  acetaminophen  (TYLENOL ) 650 MG CR tablet Take 1,300 mg by mouth 2 (two) times daily as needed for pain.   Yes [provider]  AJOVY 225 MG/1.5ML SOAJ Inject 225 mg into the skin every 28 (twenty-eight) days. 06/12/24  Yes [provider]  ALPRAZolam  (XANAX ) 0.5 MG tablet Take 1 tablet (0.5 mg total) by mouth daily as needed for anxiety. 11/12/18  Yes Danford, Rockie D, NP  dicyclomine (BENTYL) 10 MG capsule Take 10 mg by mouth 4 (four) times daily -  before meals and at bedtime.   Yes [provider]  divalproex  (DEPAKOTE ) 500 MG DR tablet Take 500 mg by mouth See admin instructions. Take 1 tablet (500mg ) by mouth for 3-5 days as needed to break headache cycle.   Yes [provider]  DULoxetine  (CYMBALTA ) 20 MG capsule Take 40 mg by mouth daily.   Yes [provider]  famotidine -calcium carbonate-magnesium  hydroxide (PEPCID  COMPLETE) 10-800-165 MG chewable tablet Chew 1 tablet by mouth 2 (two) times daily as needed (indigestion, stomach pain).   Yes [provider]  gabapentin  (NEURONTIN ) 300 MG capsule Take 300 mg by mouth 2 (two) times daily. 12/27/22  Yes [provider]  ketorolac  (TORADOL ) 60 MG/2ML SOLN injection Inject 60 mg into the muscle once as needed (severe migraine).   Yes [provider]  levocetirizine (XYZAL ) 5 MG tablet Take 5 mg by mouth at bedtime.   Yes [provider]  MAGNESIUM  PO Take 2 tablets by mouth at bedtime.   Yes [provider]  ondansetron  (ZOFRAN -ODT) 4 MG disintegrating tablet Take 4 mg by mouth every 8 (eight) hours as needed for nausea or vomiting.   Yes [provider]  pantoprazole  (PROTONIX ) 40 MG tablet Take 40 mg by mouth daily. 01/25/23  Yes [provider]  polyethylene glycol (MIRALAX ) 17 g packet Take 17 g by mouth daily as needed. Patient taking differently: Take 17 g by mouth at bedtime. 07/30/24  Yes Tobie Yetta HERO, MD  promethazine  (PHENERGAN ) 12.5 MG tablet Take 1 tablet (12.5 mg total) by mouth every 6 (six) hours as needed for nausea or vomiting. 08/03/21  Yes Ines Onetha NOVAK, MD  simethicone  (GAS-X EXTRA STRENGTH) 125 MG chewable tablet Chew 125 mg by mouth every 6 (six) hours as needed for flatulence (stomach pain, bloating).   Yes [provider]  tiZANidine  (ZANAFLEX ) 4 MG tablet Take 1 tablet (4 mg total) by mouth every 6 (six) hours as needed for muscle spasms. Or headaches. 08/03/21  Yes Ines Onetha NOVAK, MD  topiramate  (TOPAMAX ) 100  MG tablet Take 100 mg by mouth 2 (two) times daily. 07/01/24  Yes [provider]  Ubrogepant  (UBRELVY ) 100 MG TABS Take 100 mg by mouth every 2 (two) hours as needed. 08/03/21  Yes Ines Onetha NOVAK, MD  simethicone  (MYLICON) 80 MG chewable tablet Chew 1 tablet (80 mg total) by mouth 4 (four) times daily. Patient not taking: Reported on 08/19/2024 07/30/24   Tobie Yetta HERO, MD    Physical Exam: Vitals:   08/19/24 0100 08/19/24 0200 08/19/24 0758 08/19/24 0844  BP: 100/68 105/70 93/79 120/73  Pulse: 62 78 70   Resp: 17 18    Temp:  98.7 F (37.1 C) 98 F (36.7 C)   TempSrc:  Axillary    SpO2: 97% 97% 100%   Weight:      Height:       General: Alert, oriented x3, resting comfortably in no acute distress Neurologic: Awake, alert, spontaneously moves all extremities, strength intact Psychiatric: Appropriate mood and affect, conversational and cooperative   Data Reviewed:  Lab Results  Component Value Date   WBC 4.5 08/19/2024   HGB 12.2 08/19/2024   HCT 36.5 08/19/2024   MCV 94.1 08/19/2024   PLT 209 08/19/2024   Lab Results  Component Value Date   GLUCOSE 95 08/18/2024   CALCIUM 9.9 08/18/2024   NA 139 08/18/2024   K 3.6 08/18/2024   CO2 20 (L)  08/18/2024   CL 105 08/18/2024   BUN 11 08/18/2024   CREATININE 0.93 08/19/2024   Lab Results  Component Value Date   ALT 17 08/18/2024   AST 18 08/18/2024   ALKPHOS 61 08/18/2024   BILITOT 0.4 08/18/2024   Lab Results  Component Value Date   INR 1.0 07/30/2021   Radiology: US  PELVIC COMPLETE W TRANSVAGINAL AND TORSION R/O Result Date: 08/18/2024 CLINICAL DATA:  Nausea and left lower quadrant abdominal pain. CT today showed a 0.3 cm right adnexal lesion. Prior vaginal hysterectomy and bilateral salpingectomy. EXAM: TRANSABDOMINAL AND TRANSVAGINAL ULTRASOUND OF PELVIS DOPPLER ULTRASOUND OF OVARIES TECHNIQUE: Both transabdominal and transvaginal ultrasound examinations of the pelvis were performed. Transabdominal technique was performed for global imaging of the pelvis including uterus, ovaries, adnexal regions, and pelvic cul-de-sac. It was necessary to proceed with endovaginal exam following the transabdominal exam to visualize the ovaries. Color and duplex Doppler ultrasound was utilized to evaluate blood flow to the ovaries. COMPARISON:  CT scan 08/18/2024 FINDINGS: Uterus Surgically absent Endometrium N/A Right ovary Measurements: 3.1 by 1.8 by 2.2 cm = volume: 6.6 mL. Normal appearance/no adnexal mass. Left ovary Measurements: 2.9 by 1.4 by 2.0 cm = volume: 4.4 mL. Normal appearance/no adnexal mass. Pulsed Doppler evaluation of both ovaries demonstrates normal low-resistance arterial and venous waveforms in the right ovary. Color Doppler in venous flow could be seen in the left ovary but arterial flow was not well as stab list due to artifact from adjacent bowel. Other findings No abnormal free fluid. IMPRESSION: 1. The ovaries appear normal in size and appearance. No adnexal mass identified. 2. Arterial and venous flow is identified in the right ovary. Venous flow is identified in the left ovary but arterial flow was not well seen due to artifact from adjacent bowel. 3. Based on retrospective  review in the context of sonographic findings, the area of concern along the right adnexa raised on prior CT scan is thought to represent normal cecum. 4. Uterus absent. Electronically Signed   By: Ryan Salvage M.D.   On: 08/18/2024 17:26  CT Angio Abd/Pel W and/or Wo Contrast Result Date: 08/18/2024 CLINICAL DATA:  Acute mesenteric ischemia. EXAM: CTA ABDOMEN AND PELVIS WITHOUT AND WITH CONTRAST TECHNIQUE: Multidetector CT imaging of the abdomen and pelvis was performed using the standard protocol during bolus administration of intravenous contrast. Multiplanar reconstructed images and MIPs were obtained and reviewed to evaluate the vascular anatomy. RADIATION DOSE REDUCTION: This exam was performed according to the departmental dose-optimization program which includes automated exposure control, adjustment of the mA and/or kV according to patient size and/or use of iterative reconstruction technique. CONTRAST:  OMNIPAQUE  IOHEXOL  350 MG/ML SOLN COMPARISON:  CT abdomen and pelvis 08/09/2024. FINDINGS: VASCULAR Aorta: Normal caliber aorta without aneurysm, dissection, vasculitis or significant stenosis. Celiac: Patent without evidence of aneurysm, dissection, vasculitis or significant stenosis. SMA: Patent without evidence of aneurysm, dissection, vasculitis or significant stenosis. Renals: Both renal arteries are patent without evidence of aneurysm, dissection, vasculitis, fibromuscular dysplasia or significant stenosis. IMA: Patent without evidence of aneurysm, dissection, vasculitis or significant stenosis. Inflow: Patent without evidence of aneurysm, dissection, vasculitis or significant stenosis. Proximal Outflow: Bilateral common femoral and visualized portions of the superficial and profunda femoral arteries are patent without evidence of aneurysm, dissection, vasculitis or significant stenosis. Veins: No obvious venous abnormality within the limitations of this arterial phase study. The portal  venous system including the superior mesenteric vein appear grossly patent. Review of the MIP images confirms the above findings. NON-VASCULAR Lower chest: No acute abnormality. Hepatobiliary: No focal liver abnormality is seen. No gallstones, gallbladder wall thickening, or biliary dilatation. Pancreas: Unremarkable. No pancreatic ductal dilatation or surrounding inflammatory changes. Spleen: Normal in size without focal abnormality. Adrenals/Urinary Tract: Adrenal glands are unremarkable. Kidneys are normal, without renal calculi, focal lesion, or hydronephrosis. Bladder is unremarkable. Stomach/Bowel: Stomach is within normal limits. Appendix appears normal. No evidence of bowel wall thickening, distention, or inflammatory changes. No portal venous gas. Lymphatic: No enlarged lymph nodes are seen. Reproductive: The uterus is surgically absent. Left ovary is within normal limits. There is a 4.3 by 3.7 cm low-attenuation area in the right adnexa, possibly paraovarian cyst. Separate right ovary seen posterior to this and appears normal in size. Other: No abdominal wall hernia or abnormality. No abdominopelvic ascites. Musculoskeletal: No acute or significant osseous findings. IMPRESSION: 1. No evidence for mesenteric ischemia. 2. No acute localizing process in the abdomen or pelvis. 3. 4.3 cm low-attenuation area in the right adnexa, possibly paraovarian cyst. Recommend further evaluation with pelvic ultrasound. Electronically Signed   By: Greig Pique M.D.   On: 08/18/2024 15:20   CT L-SPINE NO CHARGE Result Date: 08/18/2024 EXAM: CT OF THE LUMBAR SPINE WITHOUT CONTRAST 08/18/2024 02:21:16 PM TECHNIQUE: CT of the lumbar spine was performed without the administration of intravenous contrast. Multiplanar reformatted images are provided for review. Automated exposure control, iterative reconstruction, and/or weight based adjustment of the mA/kV was utilized to reduce the radiation dose to as low as reasonably  achievable. COMPARISON: Lumbar spine radiographs 11/30/2009. CLINICAL HISTORY: Left-sided abdominal pain. FINDINGS: BONES AND ALIGNMENT: Normal vertebral body heights. No acute fracture or suspicious bone lesion. Normal alignment. DEGENERATIVE CHANGES: Moderate facet hypertrophy is present on the left at L5 to S1. No significant disc protrusion or stenosis is present. SOFT TISSUES: No acute abnormality. IMPRESSION: 1. No acute findings. 2. Moderate facet hypertrophy on the left at L5-S1 without focal stenosis Electronically signed by: Lonni Necessary MD 08/18/2024 03:12 PM EDT RP Workstation: [id number]    Assessment and Plan: 23F h/o anxiety/depression, endometriosis, migraines and  recent admission in 07/2024 for SBO that resolved with conservative mgt (NGT and IVFs) p/w recurrent abd pain.    Abd pain Possible abdominal migraine Pt reported being able to tol liquids by mouth prior to hospitalization. Her pain is unlikely to be infectious given nl WBC, and not SBO given nl CT abd imaging. High suspicion for uncontrolled anxiety presenting as somatization vs abominal migraines. -MIVF: LR at 125cc/h for now -Sumatriptan  20mg  x1 -Multimodal pain control with PO tylenol  1g TID, robaxin  500mg  TID, gabapentin  300mg  TID (BID pta), Topiramate  100mg  BID (pta), and oxycodone  5mg  prn -IV toradol  TID for 1 day -IV zofran  prn  GERD -PTA pantoprazole  40mg  daily  Anxiety -PTA duloxetine  40mg  daily -Advised OP f/u with therapist/psychiatrist prn   Advance Care Planning:   Code Status: Full Code   Consults: N/A  Family Communication: Husband  Severity of Illness: The appropriate patient status for this patient is OBSERVATION. Observation status is judged to be reasonable and necessary in order to provide the required intensity of service to ensure the patient's safety. The patient's presenting symptoms, physical exam findings, and initial radiographic and laboratory data in the context of their  medical condition is felt to place them at decreased risk for further clinical deterioration. Furthermore, it is anticipated that the patient will be medically stable for discharge from the hospital within 2 midnights of admission.    ------- I spent 56 minutes reviewing previous notes, at the bedside counseling/discussing the treatment plan, and performing clinical documentation.  Author: Marsha Ada, MD 08/19/2024 1:03 PM  For on call review [email or web address].

## 2024-08-19 NOTE — Telephone Encounter (Signed)
 Patient is calling to reschedule her Botox appointment.   Patient's [phone]

## 2024-08-19 NOTE — Progress Notes (Signed)
 Patient placed on contact precautions due to recent norovirus diagnosis on 08/14/24

## 2024-08-19 NOTE — Plan of Care (Signed)
 Patient complaining about abdominal fullness and nausea after having a large meal of food.  Obtaining x-ray abdomen to rule out SBO or ileus.  Holding oral diet.  Update, X-ray abdomen no evidence of acute abnormality/SBO/gastric distention. -Starting full liquid diet as patient tolerates can transition to regular diet eventually.  Eathan Groman, MD [facility] 08/20/2024, 1:33 AM

## 2024-08-19 NOTE — Plan of Care (Signed)

## 2024-08-19 NOTE — Care Management Obs Status (Addendum)
 MEDICARE OBSERVATION STATUS NOTIFICATION   Patient Details  Name: [name] Sanchez MRN: [id number] Date of Birth: 10-03-1996   Medicare Observation Status Notification Given:  Yes Obo notice done in error    Claretta Deed 08/19/2024, 4:34 PM

## 2024-08-19 NOTE — TOC CM/SW Note (Signed)
 Transition of Care [facility]) - Inpatient Brief Assessment   Patient Details  Name: [name] Sanchez MRN: [id number] Date of Birth: Mar 26, 1996  [facility] CM/SW Contact:    Lauraine FORBES Saa, LCSWA Phone Number: 08/19/2024, 8:51 AM   Clinical Narrative:  8:51 AM Per chart review, patient resides at home. Patient has a PCP and insurance. Patient does not have SNF/HH/DME history. Patient's preferred pharmacy is [facility]. No TOC needs identified at this time. TOC will continue to follow and be available to assist.  Transition of Care Asessment: Insurance and Status: Insurance coverage has been reviewed Patient has primary care physician: Yes Home environment has been reviewed: Private Residence Prior level of function:: N/A Prior/Current Home Services: No current home services Social Drivers of Health Review: SDOH reviewed no interventions necessary Readmission risk has been reviewed: Yes (Currently Green 9%) Transition of care needs: no transition of care needs at this time

## 2024-08-19 NOTE — ED Notes (Signed)
 [facility] in ED preparing pt for transfer

## 2024-08-19 NOTE — Hospital Course (Signed)
 [name] Sanchez is a 28 y.o. female with PMH IBS, migraine, depression, chronic myofascial pain, spondylosis of the cervical region, hysterectomy, small bowel obstruction in August 2025 and recently admitted to [facility] with left lower quadrant abdominal pain with associated nausea vomiting diarrhea.  Patient has been presented to emergency department complaining of generalized abdominal pain more severe on the left lower quadrant with associated nausea vomiting. IN ZI:yzfnibwjfprjoob stable.  Afebrile. CBC unremarkable evidence of leukocytosis.  CMP unremarkable.  Normal lipase level.  UA no evidence of UTI. CT angio abdomen pelvis no acute abnormality.  Except ovarian cyst :3. 4.3 cm  CT lumbar spine no acute finding.Moderate facet hypertrophy on the left at L5-S1 without focal stenosis. Pelvic ultrasound: Unable to identify any specific acute problem.Arterial and venous flow is identified in the right ovary. Venous flow is identified in the left ovary but arterial flow was not well seen due to artifact from adjacent bowel.  In the ED patient received morphine  multiple doses, Zofran , Toradol , Benadryl  and Reglan .   Given ultrasound abdomen unable to rule out ovarian torsion OB/GYN recommended admit to [facility] and upon arrival to the hospital they will evaluate the patient.  It is unclear if patient has chronic pain from underlying scar tissue or not.   Hospitalist consulted for further evaluation for abdominal pain with associated nausea vomiting.  PER ob gyn:The ovary is normal in size and there does not appear to be edema or free fluid in the vicinity.Can consider a repeat transvaginal ultrasound to reassess the ovary and blood flow. Would advise a reconsult to general surgery due to recent history of SBO    Subjective: Seen and examined today Overnight afebrile BP soft 100-105, on room air Labs reviewed from yesterday, normal  Assessment and plan: Principal Problem:   Abdominal pain     Class *** 35-39.9: Class II Obesity w/ Body mass index is 39.13 kg/m.: Will benefit with PCP follow-up, weight loss,healthy lifestyle and outpatient sleep eval if not done.  Mobility: PT Orders:  PT Follow up Rec:    DVT prophylaxis:  Code Status:   Code Status: Prior Family Communication: plan of care discussed with patient/*** at bedside. Patient status is: Remains hospitalized because of severity of illness Level of care: Med-Surg   Dispo: The patient is from: *** and *** at baseline.            Anticipated disposition: TBD Objective: Vitals last 24 hrs: Vitals:   08/19/24 0000 08/19/24 0018 08/19/24 0100 08/19/24 0200  BP: 105/67  100/68 105/70  Pulse: 72  62 78  Resp:   17 18  Temp:  98.6 F (37 C)  98.7 F (37.1 C)  TempSrc:    Axillary  SpO2: 98%  97% 97%  Weight:      Height:        Physical Examination: *** General exam: alert awake, oriented, older than stated age HEENT:Oral mucosa moist, Ear/Nose WNL grossly Respiratory system: Bilaterally clear BS,no use of accessory muscle Cardiovascular system: S1 & S2 +, No JVD. Gastrointestinal system: Abdomen soft,NT,ND, BS+ Nervous System: Alert, awake, moving all extremities,and following commands. Extremities: extremities warm, leg edema*** Skin: No rashes,no icterus. MSK: Normal muscle bulk,tone, power   Medications reviewed: *** Scheduled Meds:  DULoxetine   40 mg Oral Daily   feeding supplement  237 mL Oral BID BM   gabapentin   300 mg Oral BID    HYDROmorphone  (DILAUDID ) injection  1 mg Intravenous Once   magnesium  oxide  400  mg Oral QHS   ondansetron  (ZOFRAN ) IV  4 mg Intravenous Once   pantoprazole   40 mg Oral Daily   simethicone   80 mg Oral QID   topiramate   100 mg Oral BID   Continuous Infusions:  lactated ringers  125 mL/hr at 08/19/24 0049   Diet: Diet Order             Diet regular Room service appropriate? Yes; Fluid consistency: Thin  Diet effective now

## 2024-08-19 NOTE — Progress Notes (Signed)
 MD Zina at bedside. Bladder scan results 

## 2024-08-19 NOTE — Consult Note (Signed)
 OB/GYN Consult Note  Referring Provider: Micaela Speaker, MD  [name] Sanchez is a 28 y.o. [id number] admitted for recurrent pelvic pain. Gyn consulted for possible ovarian etiology or ovarian torsion. Pt had a TVH in 2019.  Pt has had severe lower pelvic pain since she was discharged in late August with a small bowel obstruction.  The pain is intermittent and feels like a twisted pain. Patient states it hurts to walk and there is positive nausea.  Lying on her right side makes it feel slightly better.  She is having a hard time eating and has lost about 15 pounds since the SBO was diagnosed.  Pt is only tolerating clear liquids currently.  Patient notes diarrhea and urinary hesitancy as well.  She denies any intercourse since before her admission for SBO.  Pt sought care because she felt the pain was feeling similar to her previous SBO.      Past Medical History:  Diagnosis Date   Anxiety    Complication of anesthesia    wakes up crying   Depression    Endometriosis    Family history of adverse reaction to anesthesia    hard to wake up, low heart rate   Gestational hypertension 01/2012   also had postpartum preelampsia   History of stomach ulcers    Hx of ovarian cyst    x 2-3   Hx of varicella    IBS (irritable bowel syndrome)    Migraine    chronic   Panic attack    Pinched nerve    left arm   Plica of knee 08/2012   medial plica left knee   SBO (small bowel obstruction) (HCC)     Past Surgical History:  Procedure Laterality Date   KNEE ARTHROSCOPY  08/17/2012   Procedure: ARTHROSCOPY KNEE;  Surgeon: Norleen LITTIE Gavel, MD;  Location: [facility];  Service: Orthopedics;  Laterality: Left;  left knee scope with tight lateral retinacular release and plica removal   TYMPANOSTOMY TUBE PLACEMENT Bilateral 1998   VAGINAL HYSTERECTOMY Bilateral 08/14/2018   Procedure: HYSTERECTOMY VAGINAL WITH SALPINGECTOMY;  Surgeon: Fredirick Glenys RAMAN, MD;  Location: [facility];  Service:  Gynecology;  Laterality: Bilateral;    OB History  Gravida Para Term Preterm AB Living  3 2 2  1 2   SAB IAB Ectopic Multiple Live Births   1   2    # Outcome Date GA Lbr Len/2nd Weight Sex Type Anes PTL Lv  3 Term 09/18/14 [redacted]w[redacted]d 10:40 / 00:05 3890 g M Vag-Spont EPI  LIV     Birth Comments: None   2 Term 01/16/12 [redacted]w[redacted]d 10:43 / 01:30 3515 g M Vag-Spont EPI  LIV     Birth Comments: No problems at birth   1 IAB 2010            Social History   Socioeconomic History   Marital status: Married    Spouse name: Will   Number of children: 2   Years of education: Not on file   Highest education level: Some college, no degree  Occupational History   Occupation: Furniture conservator/restorer: [employer]  Tobacco Use   Smoking status: Never   Smokeless tobacco: Never  Vaping Use   Vaping status: Never Used  Substance and Sexual Activity   Alcohol use: Not Currently    Alcohol/week: 0.0 standard drinks of alcohol    Comment: occassionally   Drug use: No   Sexual activity: Yes  Partners: Male    Birth control/protection: Surgical  Other Topics Concern   Not on file  Social History Narrative   Lives at home with her husband and children   Right handed   Caffeine  1 cup daily   Social Drivers of Health   Financial Resource Strain: Not on file  Food Insecurity: No Food Insecurity (08/19/2024)   Hunger Vital Sign    Worried About Running Out of Food in the Last Year: Never true    Ran Out of Food in the Last Year: Never true  Transportation Needs: No Transportation Needs (08/19/2024)   PRAPARE - Administrator, Civil Service (Medical): No    Lack of Transportation (Non-Medical): No  Physical Activity: Not on file  Stress: Not on file  Social Connections: Not on file    Family History  Problem Relation Age of Onset   Hypertension Mother    Heart disease Mother        MI   Anesthesia problems Mother        post-op N/V   Depression Mother    Anxiety disorder  Mother    Obesity Mother    Congestive Heart Failure Mother    Heart disease Father    Bipolar disorder Father    Hyperlipidemia Father    Asthma Sister    Bipolar disorder Sister    Asthma Brother    Diabetes Maternal Aunt    Uterine cancer Maternal Grandmother    Pancreatic cancer Maternal Grandmother    Lung cancer Maternal Grandfather    Migraines Neg Hx     Medications Prior to Admission  Medication Sig Dispense Refill Last Dose/Taking   acetaminophen  (TYLENOL ) 500 MG tablet Take 1,000 mg by mouth 2 (two) times daily as needed for moderate pain (pain score 4-6), fever or headache.      AJOVY 225 MG/1.5ML SOAJ Inject 225 mg into the skin every 28 (twenty-eight) days.      ALPRAZolam  (XANAX ) 0.5 MG tablet Take 1 tablet (0.5 mg total) by mouth daily as needed for anxiety. 20 tablet 0    divalproex  (DEPAKOTE ) 500 MG DR tablet Take 500 mg by mouth See admin instructions. Take 1 tablet (500mg ) by mouth for 3-5 days as needed to break headache cycle.      DULoxetine  40 MG CPEP Take 1 capsule (40 mg total) by mouth daily. 30 capsule 0    gabapentin  (NEURONTIN ) 300 MG capsule Take 300 mg by mouth 2 (two) times daily.      ibuprofen  (ADVIL ) 200 MG tablet Take 400 mg by mouth 2 (two) times daily as needed for headache or moderate pain (pain score 4-6).      ketorolac  (TORADOL ) 60 MG/2ML SOLN injection Inject 60 mg into the muscle once as needed (severe migraine).      levocetirizine (XYZAL ) 5 MG tablet Take 5 mg by mouth at bedtime.      MAGNESIUM  PO Take 2 tablets by mouth at bedtime.      pantoprazole  (PROTONIX ) 40 MG tablet Take 40 mg by mouth daily.      polyethylene glycol (MIRALAX ) 17 g packet Take 17 g by mouth daily as needed. 14 each 0    promethazine  (PHENERGAN ) 12.5 MG tablet Take 1 tablet (12.5 mg total) by mouth every 6 (six) hours as needed for nausea or vomiting. 30 tablet 6    simethicone  (MYLICON) 80 MG chewable tablet Chew 1 tablet (80 mg total) by mouth 4 (four) times  daily.  30 tablet 0    tiZANidine  (ZANAFLEX ) 4 MG tablet Take 1 tablet (4 mg total) by mouth every 6 (six) hours as needed for muscle spasms. Or headaches. 30 tablet 6    topiramate  (TOPAMAX ) 100 MG tablet Take 100 mg by mouth 2 (two) times daily.      Ubrogepant  (UBRELVY ) 100 MG TABS Take 100 mg by mouth every 2 (two) hours as needed. 2 tablet 0     Allergies  Allergen Reactions   Compazine  [Prochlorperazine ] Itching   Nurtec [Rimegepant Sulfate ] Other (See Comments)    GI intolerance    Percocet [Oxycodone -Acetaminophen ] Hives and Itching   Ceftin [Cefuroxime] Rash   Penicillins Rash    Review of Systems: Negative except for what is mentioned in HPI.     Physical Exam: BP 105/70 (BP Location: Right Arm)   Pulse 78   Temp 98.7 F (37.1 C) (Axillary)   Resp 18   Ht 5' 4 (1.626 m)   Wt 103.4 kg   LMP 07/15/2018 (Exact Date)   SpO2 97%   BMI 39.13 kg/m  CONSTITUTIONAL: Well-developed, well-nourished female in mild distress.  HENT:  Normocephalic, atraumatic, External right and left ear normal. Oropharynx is clear and moist EYES: Conjunctivae and EOM are normal.  NECK: Normal range of motion, supple, no masses SKIN: Skin is warm and dry. No rash noted. Not diaphoretic. No erythema. No pallor. NEUROLGIC: Alert and oriented to person, place, and time. Normal reflexes, muscle tone coordination. No cranial nerve deficit noted. PSYCHIATRIC: Normal mood and affect. Normal behavior. Normal judgment and thought content.  Very emotional 2/2 to continued pain. CARDIOVASCULAR: Normal heart rate noted, regular rhythm RESPIRATORY: Effort and breath sounds normal, no problems with respiration noted ABDOMEN: Soft, mild LLQ pain noted,  no guarding or rebound pain, nondistended PELVIC: deferred MUSCULOSKELETAL: Normal range of motion. No edema and no tenderness. 2+ distal pulses.    Pertinent Labs/Studies:   Results for orders placed or performed during the hospital encounter of 08/18/24  (from the past 72 hours)  Lipase, blood     Status: None   Collection Time: 08/18/24 11:09 AM  Result Value Ref Range   Lipase 42 11 - 51 U/L    Comment: Performed at [facility], [address], [place], [place] [address]  Comprehensive metabolic panel     Status: Abnormal   Collection Time: 08/18/24 11:09 AM  Result Value Ref Range   Sodium 139 135 - 145 mmol/L   Potassium 3.6 3.5 - 5.1 mmol/L   Chloride 105 98 - 111 mmol/L   CO2 20 (L) 22 - 32 mmol/L   Glucose, Bld 95 70 - 99 mg/dL    Comment: Glucose reference range applies only to samples taken after fasting for at least 8 hours.   BUN 11 6 - 20 mg/dL   Creatinine, Ser 9.04 0.44 - 1.00 mg/dL   Calcium 9.9 8.9 - 89.6 mg/dL   Total Protein 7.4 6.5 - 8.1 g/dL   Albumin 4.4 3.5 - 5.0 g/dL   AST 18 15 - 41 U/L   ALT 17 0 - 44 U/L   Alkaline Phosphatase 61 38 - 126 U/L   Total Bilirubin 0.4 0.0 - 1.2 mg/dL   GFR, Estimated >39 >39 mL/min    Comment: (NOTE) Calculated using the CKD-EPI Creatinine Equation (2021)    Anion gap 14 5 - 15    Comment: Performed at [facility], [address], [place], [place] [address]  CBC  Status: None   Collection Time: 08/18/24 11:09 AM  Result Value Ref Range   WBC 5.0 4.0 - 10.5 K/uL   RBC 4.09 3.87 - 5.11 MIL/uL   Hemoglobin 12.7 12.0 - 15.0 g/dL   HCT 62.5 63.9 - 53.9 %   MCV 91.4 80.0 - 100.0 fL   MCH 31.1 26.0 - 34.0 pg   MCHC 34.0 30.0 - 36.0 g/dL   RDW 86.7 88.4 - 84.4 %   Platelets 220 150 - 400 K/uL   nRBC 0.0 0.0 - 0.2 %    Comment: Performed at [facility], [address], [place], [place] [address]  Urinalysis, Routine w reflex microscopic -Urine, Clean Catch     Status: Abnormal   Collection Time: 08/18/24  1:55 PM  Result Value Ref Range   Color, Urine COLORLESS (A) YELLOW   APPearance CLEAR CLEAR   Specific Gravity, Urine 1.006 1.005 - 1.030   pH 6.5 5.0 - 8.0   Glucose, UA NEGATIVE NEGATIVE mg/dL   Hgb  urine dipstick NEGATIVE NEGATIVE   Bilirubin Urine NEGATIVE NEGATIVE   Ketones, ur 15 (A) NEGATIVE mg/dL   Protein, ur NEGATIVE NEGATIVE mg/dL   Nitrite NEGATIVE NEGATIVE   Leukocytes,Ua NEGATIVE NEGATIVE    Comment: Performed at [facility], [address] [place], [place] [address]  CLINICAL DATA:  Nausea and left lower quadrant abdominal pain. CT today showed a 0.3 cm right adnexal lesion. Prior vaginal hysterectomy and bilateral salpingectomy.   EXAM: TRANSABDOMINAL AND TRANSVAGINAL ULTRASOUND OF PELVIS   DOPPLER ULTRASOUND OF OVARIES   TECHNIQUE: Both transabdominal and transvaginal ultrasound examinations of the pelvis were performed. Transabdominal technique was performed for global imaging of the pelvis including uterus, ovaries, adnexal regions, and pelvic cul-de-sac.   It was necessary to proceed with endovaginal exam following the transabdominal exam to visualize the ovaries. Color and duplex Doppler ultrasound was utilized to evaluate blood flow to the ovaries.   COMPARISON:  CT scan 08/18/2024   FINDINGS: Uterus   Surgically absent   Endometrium   N/A   Right ovary   Measurements: 3.1 by 1.8 by 2.2 cm = volume: 6.6 mL. Normal appearance/no adnexal mass.   Left ovary   Measurements: 2.9 by 1.4 by 2.0 cm = volume: 4.4 mL. Normal appearance/no adnexal mass.   Pulsed Doppler evaluation of both ovaries demonstrates normal low-resistance arterial and venous waveforms in the right ovary. Color Doppler in venous flow could be seen in the left ovary but arterial flow was not well as stab list due to artifact from adjacent bowel.   Other findings   No abnormal free fluid.   IMPRESSION: 1. The ovaries appear normal in size and appearance. No adnexal mass identified. 2. Arterial and venous flow is identified in the right ovary. Venous flow is identified in the left ovary but arterial flow was not well seen due to artifact from  adjacent bowel. 3. Based on retrospective review in the context of sonographic findings, the area of concern along the right adnexa raised on prior CT scan is thought to represent normal cecum. 4. Uterus absent.       Assessment and Plan :[name] Sanchez is a 28 y.o. [id number] admitted for recurrent lower left sided abdominal/pelvic pain.    There was initial concern for ovarian torsion.  There is at least venous flow in the left ovary.  I do not believe it carries sonographic or clinical criteria for ovarian torsion.  There is no large cyst  or mass in the ovary to predispose it to torsion.  The ovary is normal in size and there does not appear to be edema or free fluid in the vicinity. Can consider a repeat transvaginal ultrasound to reassess the ovary and blood flow. Would advise a reconsult to general surgery due to recent history of SBO.   Thank you for this consult, we will follow along please call or re-consult with further questions.   For OB/GYN questions, please call the Center for [facility] at [facility] Monday - Friday, 8 am - 5 pm: [phone] All other times: [phone]    Jerilynn Buddle, MD  Attending Obstetrician & Gynecologist, [facility] for [facility], [facility]

## 2024-08-19 NOTE — Plan of Care (Signed)
  Problem: Education: Goal: Knowledge of General Education information will improve Description: Including pain rating scale, medication(s)/side effects and non-pharmacologic comfort measures Outcome: Progressing   Problem: Activity: Goal: Risk for activity intolerance will decrease Outcome: Adequate for Discharge   Problem: Nutrition: Goal: Adequate nutrition will be maintained Outcome: Not Progressing

## 2024-08-20 ENCOUNTER — Observation Stay (HOSPITAL_COMMUNITY)

## 2024-08-20 DIAGNOSIS — R634 Abnormal weight loss: Secondary | ICD-10-CM

## 2024-08-20 DIAGNOSIS — R1084 Generalized abdominal pain: Secondary | ICD-10-CM

## 2024-08-20 DIAGNOSIS — R6881 Early satiety: Secondary | ICD-10-CM

## 2024-08-20 DIAGNOSIS — R1012 Left upper quadrant pain: Secondary | ICD-10-CM | POA: Diagnosis not present

## 2024-08-20 DIAGNOSIS — R112 Nausea with vomiting, unspecified: Secondary | ICD-10-CM | POA: Diagnosis not present

## 2024-08-20 LAB — SEDIMENTATION RATE: Sed Rate: 6 mm/h (ref 0–22)

## 2024-08-20 LAB — C-REACTIVE PROTEIN: CRP: 0.7 mg/dL (ref <1.0)

## 2024-08-20 MED ORDER — METOCLOPRAMIDE HCL 5 MG/ML IJ SOLN
5.0000 mg | Freq: Four times a day (QID) | INTRAMUSCULAR | Status: DC | PRN
  Administered 2024-08-20: 5 mg via INTRAVENOUS
  Filled 2024-08-20: qty 2

## 2024-08-20 MED ORDER — PANTOPRAZOLE SODIUM 40 MG IV SOLR
40.0000 mg | INTRAVENOUS | Status: DC
Start: 2024-08-20 — End: 2024-08-20

## 2024-08-20 MED ORDER — TRIMETHOBENZAMIDE HCL 100 MG/ML IM SOLN
200.0000 mg | Freq: Four times a day (QID) | INTRAMUSCULAR | Status: DC | PRN
  Administered 2024-08-20: 200 mg via INTRAMUSCULAR
  Filled 2024-08-20 (×2): qty 2

## 2024-08-20 NOTE — Plan of Care (Signed)

## 2024-08-20 NOTE — H&P (View-Only) (Signed)
 Consultation Note   Referring Provider:   [facility] PCP: Joshua Santana CROME, NP Primary Gastroenterologist: [facility]        Reason for Consultation:  Abdominal pain  DOA: 08/18/2024         Hospital Day: 3   ASSESSMENT    28 yo female with several GI issues dating back to April 2025. More chronically she has been having nausea / vomiting, early satiety , and upper abdominal pain with associated > 20 pound weight loss.   Unclear etiology. Gastric emptying study in May 2025 was normal.  No hepatobiliary findings on several CT scans and labs have been reassuring. Component of cannabinoid hyperemesis?   History of partial SBO in late August ( resolved on follow up CT scan 8/27). For persistent symptoms repeat CT scan done on 9/5 and suggested suggested possible distal small bowel enteritis . However, stool did test positive for Norovirus on 9/10  Partial SBO was possibly due to an adhesion seen on imaging though no history of prior abdominal surgeries. She does have a FMH of Crohn's disease in brother   Norovirus Symptoms precede this positive stool test though virus may be exacerbating underlying symptoms  Fibromyalgia  Chronic migraines  Depression Anxiety  FMH of Crohn's disease Brother has Crohn's  Principal Problem:   Abdominal pain   PLAN:   --Schedule for EGD tomorrow. The risks and benefits of EGD with possible biopsies were discussed with the patient who agrees to proceed. EGD --ESR, CRP ordered --Fecal calprotectin ordered --Will consider CT enterography to evaluate small bowel. MR would be better since she has had so many CT scans but not sure can be done inpatient   HPI   [name] Sanchez is being followed by [facility] for abdominal pain, nausea and diarrhea.  Notes reviewed in care everywhere.  She was hospitalized in [place] in August for partial small bowel obstruction.  Since then she has had a couple of visits  to the ED in [facility] for persistent symptoms  07/30/24 Hospital discharge summary reviewed.  Patient had presented to the hospital with worsening abdominal pain , nausea and vomiting.  CT scan showed evidence of SBO. General Surgery evaluated.  She had NG tube placed, small bowel protocol initiated.  Symptoms eventually resolved  07/31/2024 [facility] visit for abdominal pain.  CT scan abdomen and pelvis negative for any acute findings, specifically no bowel obstruction.   08/09/2024 [place] for abdominal pain, vomiting.  Labs were reassuring. Repeat CTAP with contrast negative for bowel obstruction but did show some air-fluid levels and loops of distal small bowel which could be low-grade enteritis .   08/13/24 Seen at [facility].  KUB and stool studies ordered.  Prescribed Bentyl, Zofran   INTERVAL HISTORY:  08/18/2024 Return to [facility] ED for worsening abdominal pain, nausea.  She has early satiety. Feels full long after eating. Has frequent nausea / vomiting. She gets burning mid upper abdominal pain , especially meal related. Has lost > 20 pounds in last few months. Taking PPI at home for the abdominal pain. No GERD symptoms. No regular NSAID use. Additionally, since partial SBO she has had LLQ pain and diarrhea several times a day. No blood in stool. Prior to  pSBO had chronic constipation. LLQ worse with eating. Having BM doesn't help the pain. Defecation makes her feel raw inside.    Work up:  CBC normal LFTs, lipase normal UA with ketones, otherwise unremarkable UDS positive for opiates and THC CTA abdomen and pelvis -no acute findings   Pertinent GI Studies   Gastric emptying study 06/13/2025Normal gastric emptying at 2 and 4 hours   Labs and Imaging:  Recent Labs    08/18/24 1109  PROT 7.4  ALBUMIN 4.4  AST 18  ALT 17  ALKPHOS 61  BILITOT 0.4   Recent Labs    08/18/24 1109 08/19/24 1015  WBC 5.0 4.5  HGB 12.7 12.2  HCT 37.4 36.5  MCV 91.4 94.1  PLT 220 209    Recent Labs    08/18/24 1109 08/19/24 1015  NA 139  --   K 3.6  --   CL 105  --   CO2 20*  --   GLUCOSE 95  --   BUN 11  --   CREATININE 0.95 0.93  CALCIUM 9.9  --         Past Medical History:  Diagnosis Date   Anxiety    Complication of anesthesia    wakes up crying   Depression    Endometriosis    Family history of adverse reaction to anesthesia    hard to wake up, low heart rate   Gestational hypertension 01/2012   also had postpartum preelampsia   History of stomach ulcers    Hx of ovarian cyst    x 2-3   Hx of varicella    IBS (irritable bowel syndrome)    Migraine    chronic   Panic attack    Pinched nerve    left arm   Plica of knee 08/2012   medial plica left knee   SBO (small bowel obstruction) (HCC)     Past Surgical History:  Procedure Laterality Date   KNEE ARTHROSCOPY  08/17/2012   Procedure: ARTHROSCOPY KNEE;  Surgeon: Norleen LITTIE Gavel, MD;  Location: [facility];  Service: Orthopedics;  Laterality: Left;  left knee scope with tight lateral retinacular release and plica removal   TYMPANOSTOMY TUBE PLACEMENT Bilateral 1998   VAGINAL HYSTERECTOMY Bilateral 08/14/2018   Procedure: HYSTERECTOMY VAGINAL WITH SALPINGECTOMY;  Surgeon: Fredirick Glenys RAMAN, MD;  Location: [facility];  Service: Gynecology;  Laterality: Bilateral;    Family History  Problem Relation Age of Onset   Hypertension Mother    Heart disease Mother        MI   Anesthesia problems Mother        post-op N/V   Depression Mother    Anxiety disorder Mother    Obesity Mother    Congestive Heart Failure Mother    Heart disease Father    Bipolar disorder Father    Hyperlipidemia Father    Asthma Sister    Bipolar disorder Sister    Asthma Brother    Diabetes Maternal Aunt    Uterine cancer Maternal Grandmother    Pancreatic cancer Maternal Grandmother    Lung cancer Maternal Grandfather    Migraines Neg Hx     Prior to Admission medications   Medication Sig Start  Date End Date Taking? Authorizing Provider  acetaminophen  (TYLENOL ) 650 MG CR tablet Take 1,300 mg by mouth 2 (two) times daily as needed for pain.   Yes [provider]  AJOVY 225 MG/1.5ML SOAJ Inject 225 mg into  the skin every 28 (twenty-eight) days. 06/12/24  Yes [provider]  ALPRAZolam  (XANAX ) 0.5 MG tablet Take 1 tablet (0.5 mg total) by mouth daily as needed for anxiety. 11/12/18  Yes Danford, Rockie D, NP  dicyclomine (BENTYL) 10 MG capsule Take 10 mg by mouth 4 (four) times daily -  before meals and at bedtime.   Yes [provider]  divalproex  (DEPAKOTE ) 500 MG DR tablet Take 500 mg by mouth See admin instructions. Take 1 tablet (500mg ) by mouth for 3-5 days as needed to break headache cycle.   Yes [provider]  DULoxetine  (CYMBALTA ) 20 MG capsule Take 40 mg by mouth daily.   Yes [provider]  famotidine -calcium carbonate-magnesium  hydroxide (PEPCID  COMPLETE) 10-800-165 MG chewable tablet Chew 1 tablet by mouth 2 (two) times daily as needed (indigestion, stomach pain).   Yes [provider]  gabapentin  (NEURONTIN ) 300 MG capsule Take 300 mg by mouth 2 (two) times daily. 12/27/22  Yes [provider]  ketorolac  (TORADOL ) 60 MG/2ML SOLN injection Inject 60 mg into the muscle once as needed (severe migraine).   Yes [provider]  levocetirizine (XYZAL ) 5 MG tablet Take 5 mg by mouth at bedtime.   Yes [provider]  MAGNESIUM  PO Take 2 tablets by mouth at bedtime.   Yes [provider]  ondansetron  (ZOFRAN -ODT) 4 MG disintegrating tablet Take 4 mg by mouth every 8 (eight) hours as needed for nausea or vomiting.   Yes [provider]  pantoprazole  (PROTONIX ) 40 MG tablet Take 40 mg by mouth daily. 01/25/23  Yes [provider]  polyethylene glycol (MIRALAX ) 17 g packet Take 17 g by mouth daily as needed. Patient taking differently: Take 17 g by mouth at bedtime. 07/30/24  Yes Tobie Yetta HERO, MD  promethazine  (PHENERGAN ) 12.5 MG tablet Take 1 tablet (12.5 mg total) by mouth every 6 (six) hours as needed for nausea or vomiting. 08/03/21  Yes Ines Onetha NOVAK, MD  simethicone  (GAS-X EXTRA STRENGTH) 125 MG chewable tablet Chew 125 mg by mouth every 6 (six) hours as needed for flatulence (stomach pain, bloating).   Yes [provider]  tiZANidine  (ZANAFLEX ) 4 MG tablet Take 1 tablet (4 mg total) by mouth every 6 (six) hours as needed for muscle spasms. Or headaches. 08/03/21  Yes Ines Onetha NOVAK, MD  topiramate  (TOPAMAX ) 100 MG tablet Take 100 mg by mouth 2 (two) times daily. 07/01/24  Yes [provider]  Ubrogepant  (UBRELVY ) 100 MG TABS Take 100 mg by mouth every 2 (two) hours as needed. 08/03/21  Yes Ines Onetha NOVAK, MD  simethicone  (MYLICON) 80 MG chewable tablet Chew 1 tablet (80 mg total) by mouth 4 (four) times daily. Patient not taking: Reported on 08/19/2024 07/30/24   Tobie Yetta HERO, MD    Current Facility-Administered Medications  Medication Dose Route Frequency Provider Last Rate Last Admin   acetaminophen  (TYLENOL ) tablet 1,000 mg  1,000 mg Oral TID Georgina Basket, MD   1,000 mg at 08/20/24 9092   alum & mag hydroxide-simeth (MAALOX/MYLANTA) 200-200-20 MG/5ML suspension 30 mL  30 mL Oral Q4H PRN Sundil, Subrina, MD       DULoxetine  (CYMBALTA ) DR capsule 40 mg  40 mg Oral Daily Raford Lenis, MD   40 mg at 08/20/24 9092   enoxaparin  (LOVENOX ) injection 40 mg  40 mg Subcutaneous Q24H Georgina Basket, MD       famotidine  (PEPCID ) tablet 40 mg  40 mg Oral Daily Sundil, Subrina, MD  40 mg at 08/20/24 0908   feeding supplement (ENSURE PLUS HIGH PROTEIN) liquid 237 mL  237 mL Oral BID BM Sundil, Subrina, MD   237 mL at 08/20/24 1214   gabapentin  (NEURONTIN ) capsule 300 mg  300 mg Oral TID Georgina Basket, MD   300 mg at 08/20/24 0908   ketorolac  (TORADOL ) 15 MG/ML injection 15 mg  15 mg Intravenous Q8H PRN Georgina Basket, MD   15 mg at 08/18/24 2340   loratadine   (CLARITIN ) tablet 10 mg  10 mg Oral QHS Georgina Basket, MD   10 mg at 08/19/24 2138   magnesium  oxide (MAG-OX) tablet 400 mg  400 mg Oral QHS Raford Lenis, MD   400 mg at 08/19/24 2137   methocarbamol  (ROBAXIN ) tablet 500 mg  500 mg Oral TID Georgina Basket, MD   500 mg at 08/20/24 9091   metoCLOPramide  (REGLAN ) injection 5 mg  5 mg Intravenous Q6H PRN Kathrin Mignon DASEN, MD       ondansetron  (ZOFRAN ) injection 4 mg  4 mg Intravenous Q6H PRN Sundil, Subrina, MD   4 mg at 08/20/24 0908   oxyCODONE  (Oxy IR/ROXICODONE ) immediate release tablet 5 mg  5 mg Oral Q4H PRN Georgina Basket, MD   5 mg at 08/20/24 1213   polyethylene glycol (MIRALAX  / GLYCOLAX ) packet 17 g  17 g Oral Daily Georgina Basket, MD   17 g at 08/20/24 9090   simethicone  (MYLICON) chewable tablet 80 mg  80 mg Oral QID Raford Lenis, MD       SUMAtriptan  (IMITREX ) nasal spray 20 mg  20 mg Nasal Once Moore, Willie, MD       topiramate  (TOPAMAX ) tablet 100 mg  100 mg Oral BID Raford Lenis, MD   100 mg at 08/20/24 9092    Allergies as of 08/18/2024 - Review Complete 08/18/2024  Allergen Reaction Noted   Compazine  [prochlorperazine ] Itching 01/07/2024   Nurtec [rimegepant sulfate ] Other (See Comments) 07/27/2024   Percocet [oxycodone -acetaminophen ] Hives and Itching 08/01/2018   Ceftin [cefuroxime] Rash 09/04/2011   Penicillins Rash 07/17/2018    Social History   Socioeconomic History   Marital status: Married    Spouse name: Will   Number of children: 2   Years of education: Not on file   Highest education level: Some college, no degree  Occupational History   Occupation: Furniture conservator/restorer: [employer]  Tobacco Use   Smoking status: Never   Smokeless tobacco: Never  Vaping Use   Vaping status: Never Used  Substance and Sexual Activity   Alcohol use: Not Currently    Alcohol/week: 0.0 standard drinks of alcohol    Comment: occassionally   Drug use: No   Sexual activity: Yes    Partners: Male    Birth control/protection:  Surgical  Other Topics Concern   Not on file  Social History Narrative   Lives at home with her husband and children   Right handed   Caffeine  1 cup daily   Social Drivers of Health   Financial Resource Strain: Not on file  Food Insecurity: No Food Insecurity (08/19/2024)   Hunger Vital Sign    Worried About Running Out of Food in the Last Year: Never true    Ran Out of Food in the Last Year: Never true  Transportation Needs: No Transportation Needs (08/19/2024)   PRAPARE - Administrator, Civil Service (Medical): No    Lack of Transportation (Non-Medical): No  Physical Activity: Not on file  Stress: Not on file  Social Connections: Not on file  Intimate Partner Violence: Not At Risk (08/19/2024)   Humiliation, Afraid, Rape, and Kick questionnaire    Fear of Current or Ex-Partner: No    Emotionally Abused: No    Physically Abused: No    Sexually Abused: No     Code Status   Code Status: Full Code  Review of Systems: All systems reviewed and negative except where noted in HPI.  Physical Exam: Vital signs in last 24 hours: Temp:  [97.7 F (36.5 C)-98.2 F (36.8 C)] 97.9 F (36.6 C) (09/16 1331) Pulse Rate:  [74-96] 96 (09/16 1331) Resp:  [16-18] 16 (09/16 1331) BP: (105-118)/(71-76) 111/72 (09/16 1331) SpO2:  [91 %-100 %] 98 % (09/16 1331) Last BM Date : 08/18/24  General:  Pleasant female in NAD Psych:  Cooperative. Normal mood and affect Eyes: Pupils equal Ears:  Normal auditory acuity Nose: No deformity, discharge or lesions Neck:  Supple, no masses felt Lungs:  Clear to auscultation.  Heart:  Regular rate, regular rhythm.  Abdomen:  Soft, nondistended, diffuse upper abdominal tenderness, active bowel sounds, no masses felt Rectal :  Deferred Msk: Symmetrical without gross deformities.  Neurologic:  Alert, oriented, grossly normal neurologically Extremities : No edema Skin:  Intact without significant lesions.    Intake/Output from previous  day: 09/15 0701 - 09/16 0700 In: -  Out: 1550 [Urine:1550] Intake/Output this shift:  No intake/output data recorded.   Vina Dasen, NP-C   08/20/2024, 2:42 PM  -----------------------------------------------------------------------------   I have taken a history, reviewed the chart and examined the patient. I performed a substantive portion of this encounter, including complete performance of at least one of the key components, in conjunction with the APP. I agree with the APP's note, impression and recommendations  28 year old female with history of anxiety, depression, migraines and fibromyalgia with several months of multiple chronic GI symptoms consistent with abdominal pain, nausea, vomiting diarrhea.  She has a history of chronic constipation and abdominal pain dating back to her childhood, and reports undergoing colonoscopies in the past because of the symptoms.  She did have objective evidence of partial small bowel obstruction in August, but has had several normal imaging studies since then in the setting of symptoms that were identical to her obstructive symptoms.  She has had unintentional weight loss and she does have a family history of Crohn's disease.  Although my suspicion for underlying Crohn's disease is low, the transient bowel obstruction, family history of Crohn's and unintentional weight loss certainly warrants further investigation. The patient has many risk factors for irritable bowel syndrome/gut brain axis disorders, and also has UDS positive for opiates and marijuana.  I suspect the patient's symptoms are all secondary to underlying gut brain axis disorder/IBS.  Given her predominantly upper GI symptoms, we will proceed with upper endoscopy tomorrow to exclude peptic ulcer disease, upper tract Crohn's, peptic stricture. Would recommend enterography study, preferably MRI given her numerous CT scans, but unclear if this can be done inpatient.  Will obtain  inflammatory markers.  If inflammatory markers elevated, will consider colonoscopy while inpatient.  [name] Bungert E. Stacia, MD [facility]  Moderate complex medical decision making (this includes chart review, review of results, face-to-face time used for counseling as well as treatment plan and follow-up. The patient was provided an opportunity to ask questions and all were answered. The patient agreed with the plan and demonstrated an understanding of the instructions

## 2024-08-20 NOTE — Consult Note (Addendum)
 Consultation Note   Referring Provider:   [facility] PCP: Joshua Santana CROME, NP Primary Gastroenterologist: [facility]        Reason for Consultation:  Abdominal pain  DOA: 08/18/2024         Hospital Day: 3   ASSESSMENT    28 yo female with several GI issues dating back to April 2025. More chronically she has been having nausea / vomiting, early satiety , and upper abdominal pain with associated > 20 pound weight loss.   Unclear etiology. Gastric emptying study in May 2025 was normal.  No hepatobiliary findings on several CT scans and labs have been reassuring. Component of cannabinoid hyperemesis?   History of partial SBO in late August ( resolved on follow up CT scan 8/27). For persistent symptoms repeat CT scan done on 9/5 and suggested suggested possible distal small bowel enteritis . However, stool did test positive for Norovirus on 9/10  Partial SBO was possibly due to an adhesion seen on imaging though no history of prior abdominal surgeries. She does have a FMH of Crohn's disease in brother   Norovirus Symptoms precede this positive stool test though virus may be exacerbating underlying symptoms  Fibromyalgia  Chronic migraines  Depression Anxiety  FMH of Crohn's disease Brother has Crohn's  Principal Problem:   Abdominal pain   PLAN:   --Schedule for EGD tomorrow. The risks and benefits of EGD with possible biopsies were discussed with the patient who agrees to proceed. EGD --ESR, CRP ordered --Fecal calprotectin ordered --Will consider CT enterography to evaluate small bowel. MR would be better since she has had so many CT scans but not sure can be done inpatient   HPI   Charis is being followed by [facility] for abdominal pain, nausea and diarrhea.  Notes reviewed in care everywhere.  She was hospitalized in [place] in August for partial small bowel obstruction.  Since then she has had a couple of visits  to the ED in [facility] for persistent symptoms  07/30/24 Hospital discharge summary reviewed.  Patient had presented to the hospital with worsening abdominal pain , nausea and vomiting.  CT scan showed evidence of SBO. General Surgery evaluated.  She had NG tube placed, small bowel protocol initiated.  Symptoms eventually resolved  07/31/2024 [facility] ED visit for abdominal pain.  CT scan abdomen and pelvis negative for any acute findings, specifically no bowel obstruction.   08/09/2024 [place] for abdominal pain, vomiting.  Labs were reassuring. Repeat CTAP with contrast negative for bowel obstruction but did show some air-fluid levels and loops of distal small bowel which could be low-grade enteritis .   08/13/24 Seen at [facility].  KUB and stool studies ordered.  Prescribed Bentyl, Zofran   INTERVAL HISTORY:  08/18/2024 Return to [facility] ED for worsening abdominal pain, nausea.  She has early satiety. Feels full long after eating. Has frequent nausea / vomiting. She gets burning mid upper abdominal pain , especially meal related. Has lost > 20 pounds in last few months. Taking PPI at home for the abdominal pain. No GERD symptoms. No regular NSAID use. Additionally, since partial SBO she has had LLQ pain and diarrhea several times a day. No blood in stool. Prior to  pSBO had chronic constipation. LLQ worse with eating. Having BM doesn't help the pain. Defecation makes her feel raw inside.    Work up:  CBC normal LFTs, lipase normal UA with ketones, otherwise unremarkable UDS positive for opiates and THC CTA abdomen and pelvis -no acute findings   Pertinent GI Studies   Gastric emptying study 06/13/2025Normal gastric emptying at 2 and 4 hours   Labs and Imaging:  Recent Labs    08/18/24 1109  PROT 7.4  ALBUMIN 4.4  AST 18  ALT 17  ALKPHOS 61  BILITOT 0.4   Recent Labs    08/18/24 1109 08/19/24 1015  WBC 5.0 4.5  HGB 12.7 12.2  HCT 37.4 36.5  MCV 91.4 94.1  PLT 220 209    Recent Labs    08/18/24 1109 08/19/24 1015  NA 139  --   K 3.6  --   CL 105  --   CO2 20*  --   GLUCOSE 95  --   BUN 11  --   CREATININE 0.95 0.93  CALCIUM 9.9  --         Past Medical History:  Diagnosis Date   Anxiety    Complication of anesthesia    wakes up crying   Depression    Endometriosis    Family history of adverse reaction to anesthesia    hard to wake up, low heart rate   Gestational hypertension 01/2012   also had postpartum preelampsia   History of stomach ulcers    Hx of ovarian cyst    x 2-3   Hx of varicella    IBS (irritable bowel syndrome)    Migraine    chronic   Panic attack    Pinched nerve    left arm   Plica of knee 08/2012   medial plica left knee   SBO (small bowel obstruction) (HCC)     Past Surgical History:  Procedure Laterality Date   KNEE ARTHROSCOPY  08/17/2012   Procedure: ARTHROSCOPY KNEE;  Surgeon: Norleen LITTIE Gavel, MD;  Location: [facility];  Service: Orthopedics;  Laterality: Left;  left knee scope with tight lateral retinacular release and plica removal   TYMPANOSTOMY TUBE PLACEMENT Bilateral 1998   VAGINAL HYSTERECTOMY Bilateral 08/14/2018   Procedure: HYSTERECTOMY VAGINAL WITH SALPINGECTOMY;  Surgeon: Fredirick Glenys RAMAN, MD;  Location: [facility];  Service: Gynecology;  Laterality: Bilateral;    Family History  Problem Relation Age of Onset   Hypertension Mother    Heart disease Mother        MI   Anesthesia problems Mother        post-op N/V   Depression Mother    Anxiety disorder Mother    Obesity Mother    Congestive Heart Failure Mother    Heart disease Father    Bipolar disorder Father    Hyperlipidemia Father    Asthma Sister    Bipolar disorder Sister    Asthma Brother    Diabetes Maternal Aunt    Uterine cancer Maternal Grandmother    Pancreatic cancer Maternal Grandmother    Lung cancer Maternal Grandfather    Migraines Neg Hx     Prior to Admission medications   Medication Sig Start  Date End Date Taking? Authorizing Provider  acetaminophen  (TYLENOL ) 650 MG CR tablet Take 1,300 mg by mouth 2 (two) times daily as needed for pain.   Yes [provider]  AJOVY 225 MG/1.5ML SOAJ Inject 225 mg into  the skin every 28 (twenty-eight) days. 06/12/24  Yes [provider]  ALPRAZolam  (XANAX ) 0.5 MG tablet Take 1 tablet (0.5 mg total) by mouth daily as needed for anxiety. 11/12/18  Yes Danford, Rockie D, NP  dicyclomine (BENTYL) 10 MG capsule Take 10 mg by mouth 4 (four) times daily -  before meals and at bedtime.   Yes [provider]  divalproex  (DEPAKOTE ) 500 MG DR tablet Take 500 mg by mouth See admin instructions. Take 1 tablet (500mg ) by mouth for 3-5 days as needed to break headache cycle.   Yes [provider]  DULoxetine  (CYMBALTA ) 20 MG capsule Take 40 mg by mouth daily.   Yes [provider]  famotidine -calcium carbonate-magnesium  hydroxide (PEPCID  COMPLETE) 10-800-165 MG chewable tablet Chew 1 tablet by mouth 2 (two) times daily as needed (indigestion, stomach pain).   Yes [provider]  gabapentin  (NEURONTIN ) 300 MG capsule Take 300 mg by mouth 2 (two) times daily. 12/27/22  Yes [provider]  ketorolac  (TORADOL ) 60 MG/2ML SOLN injection Inject 60 mg into the muscle once as needed (severe migraine).   Yes [provider]  levocetirizine (XYZAL ) 5 MG tablet Take 5 mg by mouth at bedtime.   Yes [provider]  MAGNESIUM  PO Take 2 tablets by mouth at bedtime.   Yes [provider]  ondansetron  (ZOFRAN -ODT) 4 MG disintegrating tablet Take 4 mg by mouth every 8 (eight) hours as needed for nausea or vomiting.   Yes [provider]  pantoprazole  (PROTONIX ) 40 MG tablet Take 40 mg by mouth daily. 01/25/23  Yes [provider]  polyethylene glycol (MIRALAX ) 17 g packet Take 17 g by mouth daily as needed. Patient taking differently: Take 17 g by mouth at bedtime. 07/30/24  Yes Tobie Yetta HERO, MD  promethazine  (PHENERGAN ) 12.5 MG tablet Take 1 tablet (12.5 mg total) by mouth every 6 (six) hours as needed for nausea or vomiting. 08/03/21  Yes Ines Onetha NOVAK, MD  simethicone  (GAS-X EXTRA STRENGTH) 125 MG chewable tablet Chew 125 mg by mouth every 6 (six) hours as needed for flatulence (stomach pain, bloating).   Yes [provider]  tiZANidine  (ZANAFLEX ) 4 MG tablet Take 1 tablet (4 mg total) by mouth every 6 (six) hours as needed for muscle spasms. Or headaches. 08/03/21  Yes Ines Onetha NOVAK, MD  topiramate  (TOPAMAX ) 100 MG tablet Take 100 mg by mouth 2 (two) times daily. 07/01/24  Yes [provider]  Ubrogepant  (UBRELVY ) 100 MG TABS Take 100 mg by mouth every 2 (two) hours as needed. 08/03/21  Yes Ines Onetha NOVAK, MD  simethicone  (MYLICON) 80 MG chewable tablet Chew 1 tablet (80 mg total) by mouth 4 (four) times daily. Patient not taking: Reported on 08/19/2024 07/30/24   Tobie Yetta HERO, MD    Current Facility-Administered Medications  Medication Dose Route Frequency Provider Last Rate Last Admin   acetaminophen  (TYLENOL ) tablet 1,000 mg  1,000 mg Oral TID Georgina Basket, MD   1,000 mg at 08/20/24 9092   alum & mag hydroxide-simeth (MAALOX/MYLANTA) 200-200-20 MG/5ML suspension 30 mL  30 mL Oral Q4H PRN Sundil, Subrina, MD       DULoxetine  (CYMBALTA ) DR capsule 40 mg  40 mg Oral Daily Raford Lenis, MD   40 mg at 08/20/24 9092   enoxaparin  (LOVENOX ) injection 40 mg  40 mg Subcutaneous Q24H Georgina Basket, MD       famotidine  (PEPCID ) tablet 40 mg  40 mg Oral Daily Sundil, Subrina, MD  40 mg at 08/20/24 0908   feeding supplement (ENSURE PLUS HIGH PROTEIN) liquid 237 mL  237 mL Oral BID BM Sundil, Subrina, MD   237 mL at 08/20/24 1214   gabapentin  (NEURONTIN ) capsule 300 mg  300 mg Oral TID Georgina Basket, MD   300 mg at 08/20/24 0908   ketorolac  (TORADOL ) 15 MG/ML injection 15 mg  15 mg Intravenous Q8H PRN Georgina Basket, MD   15 mg at 08/18/24 2340   loratadine   (CLARITIN ) tablet 10 mg  10 mg Oral QHS Georgina Basket, MD   10 mg at 08/19/24 2138   magnesium  oxide (MAG-OX) tablet 400 mg  400 mg Oral QHS Raford Lenis, MD   400 mg at 08/19/24 2137   methocarbamol  (ROBAXIN ) tablet 500 mg  500 mg Oral TID Georgina Basket, MD   500 mg at 08/20/24 9091   metoCLOPramide  (REGLAN ) injection 5 mg  5 mg Intravenous Q6H PRN Kathrin Mignon DASEN, MD       ondansetron  (ZOFRAN ) injection 4 mg  4 mg Intravenous Q6H PRN Sundil, Subrina, MD   4 mg at 08/20/24 0908   oxyCODONE  (Oxy IR/ROXICODONE ) immediate release tablet 5 mg  5 mg Oral Q4H PRN Georgina Basket, MD   5 mg at 08/20/24 1213   polyethylene glycol (MIRALAX  / GLYCOLAX ) packet 17 g  17 g Oral Daily Georgina Basket, MD   17 g at 08/20/24 9090   simethicone  (MYLICON) chewable tablet 80 mg  80 mg Oral QID Raford Lenis, MD       SUMAtriptan  (IMITREX ) nasal spray 20 mg  20 mg Nasal Once Moore, Willie, MD       topiramate  (TOPAMAX ) tablet 100 mg  100 mg Oral BID Raford Lenis, MD   100 mg at 08/20/24 9092    Allergies as of 08/18/2024 - Review Complete 08/18/2024  Allergen Reaction Noted   Compazine  [prochlorperazine ] Itching 01/07/2024   Nurtec [rimegepant sulfate ] Other (See Comments) 07/27/2024   Percocet [oxycodone -acetaminophen ] Hives and Itching 08/01/2018   Ceftin [cefuroxime] Rash 09/04/2011   Penicillins Rash 07/17/2018    Social History   Socioeconomic History   Marital status: Married    Spouse name: Will   Number of children: 2   Years of education: Not on file   Highest education level: Some college, no degree  Occupational History   Occupation: Furniture conservator/restorer: [employer]  Tobacco Use   Smoking status: Never   Smokeless tobacco: Never  Vaping Use   Vaping status: Never Used  Substance and Sexual Activity   Alcohol use: Not Currently    Alcohol/week: 0.0 standard drinks of alcohol    Comment: occassionally   Drug use: No   Sexual activity: Yes    Partners: Male    Birth control/protection:  Surgical  Other Topics Concern   Not on file  Social History Narrative   Lives at home with her husband and children   Right handed   Caffeine  1 cup daily   Social Drivers of Health   Financial Resource Strain: Not on file  Food Insecurity: No Food Insecurity (08/19/2024)   Hunger Vital Sign    Worried About Running Out of Food in the Last Year: Never true    Ran Out of Food in the Last Year: Never true  Transportation Needs: No Transportation Needs (08/19/2024)   PRAPARE - Administrator, Civil Service (Medical): No    Lack of Transportation (Non-Medical): No  Physical Activity: Not on file  Stress: Not on file  Social Connections: Not on file  Intimate Partner Violence: Not At Risk (08/19/2024)   Humiliation, Afraid, Rape, and Kick questionnaire    Fear of Current or Ex-Partner: No    Emotionally Abused: No    Physically Abused: No    Sexually Abused: No     Code Status   Code Status: Full Code  Review of Systems: All systems reviewed and negative except where noted in HPI.  Physical Exam: Vital signs in last 24 hours: Temp:  [97.7 F (36.5 C)-98.2 F (36.8 C)] 97.9 F (36.6 C) (09/16 1331) Pulse Rate:  [74-96] 96 (09/16 1331) Resp:  [16-18] 16 (09/16 1331) BP: (105-118)/(71-76) 111/72 (09/16 1331) SpO2:  [91 %-100 %] 98 % (09/16 1331) Last BM Date : 08/18/24  General:  Pleasant female in NAD Psych:  Cooperative. Normal mood and affect Eyes: Pupils equal Ears:  Normal auditory acuity Nose: No deformity, discharge or lesions Neck:  Supple, no masses felt Lungs:  Clear to auscultation.  Heart:  Regular rate, regular rhythm.  Abdomen:  Soft, nondistended, diffuse upper abdominal tenderness, active bowel sounds, no masses felt Rectal :  Deferred Msk: Symmetrical without gross deformities.  Neurologic:  Alert, oriented, grossly normal neurologically Extremities : No edema Skin:  Intact without significant lesions.    Intake/Output from previous  day: 09/15 0701 - 09/16 0700 In: -  Out: 1550 [Urine:1550] Intake/Output this shift:  No intake/output data recorded.   Vina Dasen, NP-C   08/20/2024, 2:42 PM  -----------------------------------------------------------------------------   I have taken a history, reviewed the chart and examined the patient. I performed a substantive portion of this encounter, including complete performance of at least one of the key components, in conjunction with the APP. I agree with the APP's note, impression and recommendations  28 year old female with history of anxiety, depression, migraines and fibromyalgia with several months of multiple chronic GI symptoms consistent with abdominal pain, nausea, vomiting diarrhea.  She has a history of chronic constipation and abdominal pain dating back to her childhood, and reports undergoing colonoscopies in the past because of the symptoms.  She did have objective evidence of partial small bowel obstruction in August, but has had several normal imaging studies since then in the setting of symptoms that were identical to her obstructive symptoms.  She has had unintentional weight loss and she does have a family history of Crohn's disease.  Although my suspicion for underlying Crohn's disease is low, the transient bowel obstruction, family history of Crohn's and unintentional weight loss certainly warrants further investigation. The patient has many risk factors for irritable bowel syndrome/gut brain axis disorders, and also has UDS positive for opiates and marijuana.  I suspect the patient's symptoms are all secondary to underlying gut brain axis disorder/IBS.  Given her predominantly upper GI symptoms, we will proceed with upper endoscopy tomorrow to exclude peptic ulcer disease, upper tract Crohn's, peptic stricture. Would recommend enterography study, preferably MRI given her numerous CT scans, but unclear if this can be done inpatient.  Will obtain  inflammatory markers.  If inflammatory markers elevated, will consider colonoscopy while inpatient.  Virtie Bungert E. Stacia, MD [facility]  Moderate complex medical decision making (this includes chart review, review of results, face-to-face time used for counseling as well as treatment plan and follow-up. The patient was provided an opportunity to ask questions and all were answered. The patient agreed with the plan and demonstrated an understanding of the instructions

## 2024-08-20 NOTE — Progress Notes (Signed)
 PROGRESS NOTE  [name] Sanchez FMW:[id number] DOB: 03/14/1996   PCP: Joshua Santana CROME, NP  Patient is from: Home.  DOA: 08/18/2024 LOS: 1  Chief complaints Chief Complaint  Patient presents with   Abdominal Pain     Brief Narrative / Interim history: 28 year old F with PMH of anxiety, depression, fibromyalgia, migraine headache, endometriosis and recent hospitalization from 8/22-8/26 for partial SBO for which she was treated with NG tube returning with abdominal pain for 3 to 4 days with associated nausea and vomiting.  Patient has had LLQ pain since that hospitalization.  She was seen by GI at [facility] on 9/9.  She had multiple imaging including KUB and CT abdomen and pelvis without explanation.  She tested positive for norovirus on 9/10.  She was prescribed Zofran  and low-dose Bentyl.   In ED, stable vitals.  CMP, lipase and CBC without significant finding.  UDS positive for opiate and marijuana.  CT angio abdomen and pelvis with no acute finding but 4.3 cm low-attenuation area in the right adnexa.  Pelvic ultrasound with normal right ovary.  Subjective: Seen and examined earlier this morning.  Patient had severe abdominal pain after large meal last night.  KUB without significant finding.  Diet changed to full liquid.  Continues to endorse nausea but no emesis.  Also had loose stool.  Continues to endorse significant pain.  Pain is diffuse and she describes it as sharp sometimes cramping and also burning.  Objective: Vitals:   08/19/24 1948 08/20/24 0500 08/20/24 0816 08/20/24 1331  BP: 116/76 105/71 113/76 111/72  Pulse: 74 79 84 96  Resp: 16 16 18 16   Temp: 98.2 F (36.8 C) 97.7 F (36.5 C) 98.1 F (36.7 C) 97.9 F (36.6 C)  TempSrc: Oral Oral Oral Oral  SpO2: 100% 91% 100% 98%  Weight:      Height:        Examination:  GENERAL: No apparent distress.  Nontoxic. HEENT: MMM.  Vision and hearing grossly intact.  NECK: Supple.  No apparent JVD.  RESP:  No IWOB.  Fair  aeration bilaterally. CVS:  RRR. Heart sounds normal.  ABD/GI/GU: BS+. Abd soft.  LUQ and LLQ tenderness.  No rebound. MSK/EXT:  Moves extremities. No apparent deformity. No edema.  SKIN: no apparent skin lesion or wound NEURO: AA.  Oriented appropriately.  No apparent focal neuro deficit. PSYCH: Calm. Normal affect.   Consultants:  Gastroenterology  Procedures: None  Microbiology summarized: None  Assessment and plan: Diffuse abdominal pain/nausea: Ongoing abdominal pain since partial SBO last month.  Associated nausea and vomiting.  Having bowel movements.  Blood work and imaging unrevealing.  Patient is concerned due to family history of GI malignancy (grandparent) and Crohn's (brother).  Also concerned about uptrending lipase although within normal.  Recently tested positive for norovirus but she does not think this is contributing.  Has history of anxiety, depression, fibromyalgia and migraine which also raises concern for functional etiology.  UDS positive for opiate and marijuana.  Exam with mild LUQ and LLQ tenderness.  No rebound or guarding. -Continue full liquid diet -Continue Pepcid . -Continue IV Zofran .  Add Reglan  for refractory nausea and vomiting -GI consulted.  Plan for EGD with possible biopsies, CRP, ESR and fecal     Anxiety/depression/fibromyalgia -Continue home Cymbalta , gabapentin , Depakote   Migraine headache: Stable - Continue home Topamax  and Imitrex   Marijuana use - Encourage cessation  GERD -Resume PPI  Class II obesity Body mass index is 39.13 kg/m.  DVT prophylaxis:  enoxaparin  (LOVENOX ) injection 40 mg Start: 08/19/24 0915  Code Status: Full code Family Communication: Significant other on the phone during this encounter. Level of care: Med-Surg Status is: Observation The patient will require care spanning > 2 midnights and should be moved to inpatient because: Intractable abdominal pain, nausea   Final disposition: Home once  medically stable   55 minutes with more than 50% spent in reviewing records, counseling patient/family and coordinating care.   Sch Meds:  Scheduled Meds:  acetaminophen   1,000 mg Oral TID   DULoxetine   40 mg Oral Daily   enoxaparin  (LOVENOX ) injection  40 mg Subcutaneous Q24H   famotidine   40 mg Oral Daily   feeding supplement  237 mL Oral BID BM   gabapentin   300 mg Oral TID   loratadine   10 mg Oral QHS   magnesium  oxide  400 mg Oral QHS   methocarbamol   500 mg Oral TID   polyethylene glycol  17 g Oral Daily   simethicone   80 mg Oral QID   SUMAtriptan   20 mg Nasal Once   topiramate   100 mg Oral BID   Continuous Infusions: PRN Meds:.alum & mag hydroxide-simeth, ketorolac , metoCLOPramide  (REGLAN ) injection, ondansetron  (ZOFRAN ) IV, oxyCODONE   Antimicrobials: Anti-infectives (From admission, onward)    None        I have personally reviewed the following labs and images: CBC: Recent Labs  Lab 08/18/24 1109 08/19/24 1015  WBC 5.0 4.5  HGB 12.7 12.2  HCT 37.4 36.5  MCV 91.4 94.1  PLT 220 209   BMP &GFR Recent Labs  Lab 08/18/24 1109 08/19/24 1015  NA 139  --   K 3.6  --   CL 105  --   CO2 20*  --   GLUCOSE 95  --   BUN 11  --   CREATININE 0.95 0.93  CALCIUM 9.9  --    Estimated Creatinine Clearance: 104.6 mL/min (by C-G formula based on SCr of 0.93 mg/dL). Liver & Pancreas: Recent Labs  Lab 08/18/24 1109  AST 18  ALT 17  ALKPHOS 61  BILITOT 0.4  PROT 7.4  ALBUMIN 4.4   Recent Labs  Lab 08/18/24 1109 08/19/24 2256  LIPASE 42 48   No results for input(s): AMMONIA in the last 168 hours. Diabetic: No results for input(s): HGBA1C in the last 72 hours. No results for input(s): GLUCAP in the last 168 hours. Cardiac Enzymes: No results for input(s): CKTOTAL, CKMB, CKMBINDEX, TROPONINI in the last 168 hours. No results for input(s): PROBNP in the last 8760 hours. Coagulation Profile: No results for input(s): INR, PROTIME in  the last 168 hours. Thyroid Function Tests: No results for input(s): TSH, T4TOTAL, FREET4, T3FREE, THYROIDAB in the last 72 hours. Lipid Profile: No results for input(s): CHOL, HDL, LDLCALC, TRIG, CHOLHDL, LDLDIRECT in the last 72 hours. Anemia Panel: No results for input(s): VITAMINB12, FOLATE, FERRITIN, TIBC, IRON, RETICCTPCT in the last 72 hours. Urine analysis:    Component Value Date/Time   COLORURINE COLORLESS (A) 08/18/2024 1355   APPEARANCEUR CLEAR 08/18/2024 1355   LABSPEC 1.006 08/18/2024 1355   PHURINE 6.5 08/18/2024 1355   GLUCOSEU NEGATIVE 08/18/2024 1355   GLUCOSEU NEGATIVE 04/04/2012 1410   HGBUR NEGATIVE 08/18/2024 1355   BILIRUBINUR NEGATIVE 08/18/2024 1355   BILIRUBINUR negative 09/19/2018 1404   KETONESUR 15 (A) 08/18/2024 1355   PROTEINUR NEGATIVE 08/18/2024 1355   UROBILINOGEN 0.2 09/19/2018 1404   UROBILINOGEN 0.2 04/17/2014 2157   NITRITE NEGATIVE 08/18/2024 1355  LEUKOCYTESUR NEGATIVE 08/18/2024 1355   Sepsis Labs: Invalid input(s): PROCALCITONIN, LACTICIDVEN  Microbiology: No results found for this or any previous visit (from the past 240 hours).  Radiology Studies: DG Abd 1 View Result Date: 08/20/2024 CLINICAL DATA:  Abdominal distension EXAM: ABDOMEN - 1 VIEW COMPARISON:  CT from 08/18/2024 FINDINGS: Scattered large and small bowel gas is noted. No obstructive changes are seen. No free air is noted. No bony abnormality is seen. IMPRESSION: No acute abnormality noted. Electronically Signed   By: Oneil Devonshire M.D.   On: 08/20/2024 00:38      Jakobe Blau T. Preslei Blakley [facility]  If 7PM-7AM, please contact night-coverage [email or web address] 08/20/2024, 5:13 PM

## 2024-08-21 ENCOUNTER — Encounter (HOSPITAL_COMMUNITY)
Admission: EM | Disposition: A | Discharge status: Home / Self Care | Payer: Self-pay | Source: Home / Self Care | Attending: Hospitalist

## 2024-08-21 ENCOUNTER — Encounter (HOSPITAL_COMMUNITY): Payer: Self-pay | Admitting: Gastroenterology

## 2024-08-21 ENCOUNTER — Observation Stay (HOSPITAL_COMMUNITY): Payer: Self-pay | Admitting: Anesthesiology

## 2024-08-21 ENCOUNTER — Ambulatory Visit (HOSPITAL_COMMUNITY): Payer: Self-pay | Admitting: Anesthesiology

## 2024-08-21 DIAGNOSIS — R112 Nausea with vomiting, unspecified: Secondary | ICD-10-CM

## 2024-08-21 DIAGNOSIS — R1032 Left lower quadrant pain: Secondary | ICD-10-CM

## 2024-08-21 DIAGNOSIS — R110 Nausea: Secondary | ICD-10-CM

## 2024-08-21 DIAGNOSIS — R634 Abnormal weight loss: Secondary | ICD-10-CM

## 2024-08-21 HISTORY — PX: ESOPHAGOGASTRODUODENOSCOPY: SHX5428

## 2024-08-21 SURGERY — EGD (ESOPHAGOGASTRODUODENOSCOPY)
Anesthesia: Monitor Anesthesia Care

## 2024-08-21 MED ORDER — ONDANSETRON HCL 4 MG/2ML IJ SOLN
INTRAMUSCULAR | Status: DC | PRN
  Administered 2024-08-21: 4 mg via INTRAVENOUS

## 2024-08-21 MED ORDER — METOCLOPRAMIDE HCL 5 MG PO TABS: 5.0000 mg | ORAL_TABLET | Freq: Four times a day (QID) | ORAL | 0 refills | Status: AC | PRN

## 2024-08-21 MED ORDER — PHENYLEPHRINE 80 MCG/ML (10ML) SYRINGE FOR IV PUSH (FOR BLOOD PRESSURE SUPPORT)
PREFILLED_SYRINGE | INTRAVENOUS | Status: DC | PRN
  Administered 2024-08-21: 80 ug via INTRAVENOUS

## 2024-08-21 MED ORDER — SODIUM CHLORIDE 0.9 % IV SOLN
INTRAVENOUS | Status: DC | PRN
  Administered 2024-08-21: 500 mL via INTRAVENOUS

## 2024-08-21 MED ORDER — LIDOCAINE 2% (20 MG/ML) 5 ML SYRINGE
INTRAMUSCULAR | Status: DC | PRN
  Administered 2024-08-21: 80 mg via INTRAVENOUS

## 2024-08-21 MED ORDER — EPHEDRINE SULFATE-NACL 50-0.9 MG/10ML-% IV SOSY
PREFILLED_SYRINGE | INTRAVENOUS | Status: DC | PRN
  Administered 2024-08-21 (×2): 5 mg via INTRAVENOUS

## 2024-08-21 MED ORDER — ONDANSETRON HCL 4 MG/2ML IJ SOLN
INTRAMUSCULAR | Status: AC
  Filled 2024-08-21: qty 2

## 2024-08-21 MED ORDER — PROPOFOL 500 MG/50ML IV EMUL
INTRAVENOUS | Status: DC | PRN
  Administered 2024-08-21 (×5): 30 mg via INTRAVENOUS
  Administered 2024-08-21: 100 ug/kg/min via INTRAVENOUS

## 2024-08-21 NOTE — Anesthesia Postprocedure Evaluation (Signed)
 Anesthesia Post Note  Patient: [name] Sanchez  Procedure(s) Performed: EGD (ESOPHAGOGASTRODUODENOSCOPY)     Patient location during evaluation: Endoscopy Anesthesia Type: MAC Level of consciousness: awake and alert Pain management: pain level controlled Vital Signs Assessment: post-procedure vital signs reviewed and stable Respiratory status: spontaneous breathing, nonlabored ventilation, respiratory function stable and patient connected to nasal cannula oxygen Cardiovascular status: blood pressure returned to baseline and stable Postop Assessment: no apparent nausea or vomiting Anesthetic complications: no   No notable events documented.  Last Vitals:  Vitals:   08/21/24 1020 08/21/24 1031  BP: (!) 99/51 104/61  Pulse: 86 85  Resp: 12 20  Temp:    SpO2: 100% 100%    Last Pain:  Vitals:   08/21/24 1111  TempSrc:   PainSc: 3                  Rome Ade

## 2024-08-21 NOTE — Interval H&P Note (Signed)
 History and Physical Interval Note:  08/21/2024 9:14 AM  [name] Sanchez  has presented today for surgery, with the diagnosis of nausea, vomiting, upper abdominal pain, weight loss.  The various methods of treatment have been discussed with the patient and family. After consideration of risks, benefits and other options for treatment, the patient has consented to  Procedure(s): EGD (ESOPHAGOGASTRODUODENOSCOPY) (N/A) as a surgical intervention.  The patient's history has been reviewed, patient examined, no change in status, stable for surgery.  I have reviewed the patient's chart and labs.  Questions were answered to the patient's satisfaction.     Glendia FORBES Holt

## 2024-08-21 NOTE — Op Note (Signed)
 [facility] Patient Name: [name] Sanchez Procedure Date : 08/21/2024 MRN: [id number] Attending MD: Glendia BRAVO. Stacia , MD, [id number] Date of Birth: 1995-12-16 CSN: [id number] Age: 28 Admit Type: Inpatient Procedure:                Upper GI endoscopy Indications:              Generalized abdominal pain, Nausea with vomiting,                            Weight loss Providers:                Glendia E. Stacia, MD, Mliss Eagles, RN,                            Haskel Chris, Technician Referring MD:              Medicines:                Monitored Anesthesia Care Complications:            No immediate complications. Estimated Blood Loss:     Estimated blood loss was minimal. Procedure:                Pre-Anesthesia Assessment:                           - Prior to the procedure, a History and Physical                            was performed, and patient medications and                            allergies were reviewed. The patient's tolerance of                            previous anesthesia was also reviewed. The risks                            and benefits of the procedure and the sedation                            options and risks were discussed with the patient.                            All questions were answered, and informed consent                            was obtained. Prior Anticoagulants: The patient has                            taken no anticoagulant or antiplatelet agents. ASA                            Grade Assessment: II - A patient with mild systemic  disease. After reviewing the risks and benefits,                            the patient was deemed in satisfactory condition to                            undergo the procedure.                           After obtaining informed consent, the endoscope was                            passed under direct vision. Throughout the                            procedure, the  patient's blood pressure, pulse, and                            oxygen saturations were monitored continuously. The                            [id number]-[id number] ([id number]) Olympus endoscope was introduced                            through the mouth, and advanced to the second part                            of duodenum. The upper GI endoscopy was                            accomplished without difficulty. The patient                            tolerated the procedure well. Scope In: Scope Out: Findings:      The examined esophagus was normal.      The examined portions of the nasopharynx, oropharynx and larynx were       normal.      The entire examined stomach was normal. Biopsies were taken with a cold       forceps for histology. Estimated blood loss was minimal.      The examined duodenum was normal. Biopsies for histology were taken with       a cold forceps for evaluation of celiac disease. Estimated blood loss       was minimal. Impression:               - Normal esophagus.                           - The examined portions of the nasopharynx,                            oropharynx and larynx were normal.                           - Normal stomach.                           -  Normal examined duodenum.                           - No endoscopic abnormalities to explain patient's                            symptoms. 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 Moderate Sedation:      N/A Recommendation:           - Return patient to hospital ward for possible                            discharge same day.                           - Advance diet as tolerated.                           - Continue present medications to include                            dicyclomine and PRN zofran .                           - Await pathology results.                           - Follow up with outpatient                            Gastroenterologist/[facility]; consider addition of  TCA                            or low FODMAP diet to help manage chronic GI                            symptoms.                           - Also consider outpatient enterography (CT vs MR)                            to further exclude Crohn's disease, given history                            of pSBO and family history of Crohn's disease.                           - CRP, ESR normal on admission, fecal calprotectin                            pending.                           - GI will sign  off at this time. Procedure Code(s):        --- Professional ---                           [phone], Esophagogastroduodenoscopy, flexible,                            transoral; with biopsy, single or multiple Diagnosis Code(s):        --- Professional ---                           R10.84, Generalized abdominal pain                           R11.2, Nausea with vomiting, unspecified                           R63.4, Abnormal weight loss CPT copyright 2022 [employer]. All rights reserved. The codes documented in this report are preliminary and upon coder review may  be revised to meet current compliance requirements. Lacinda Curvin E. Stacia, MD 08/21/2024 10:14:36 AM This report has been signed electronically. Number of Addenda: 0

## 2024-08-21 NOTE — Transfer of Care (Signed)
 Immediate Anesthesia Transfer of Care Note  Patient: [name] Sanchez  Procedure(s) Performed: EGD (ESOPHAGOGASTRODUODENOSCOPY)  Patient Location: PACU  Anesthesia Type:MAC  Level of Consciousness: awake and alert   Airway & Oxygen Therapy: Patient Spontanous Breathing  Post-op Assessment: Report given to RN and Post -op Vital signs reviewed and stable  Post vital signs: Reviewed and stable  Last Vitals:  Vitals Value Taken Time  BP 94/55 08/21/24 10:05  Temp 97.3   Pulse 94 08/21/24 10:07  Resp 26 08/21/24 10:07  SpO2 100 % 08/21/24 10:07  Vitals shown include unfiled device data.  Last Pain:  Vitals:   08/21/24 0909  TempSrc: Temporal  PainSc: 4          Complications: No notable events documented.

## 2024-08-21 NOTE — Anesthesia Preprocedure Evaluation (Signed)
 Anesthesia Evaluation  Patient identified by MRN, date of birth, ID band Patient awake    Reviewed: Allergy & Precautions, NPO status , Patient's Chart, lab work & pertinent test results  History of Anesthesia Complications Negative for: history of anesthetic complications  Airway Mallampati: III  TM Distance: >3 FB Neck ROM: Full    Dental no notable dental hx. (+) Teeth Intact   Pulmonary neg pulmonary ROS, neg sleep apnea, neg COPD, Patient abstained from smoking.Not current smoker   Pulmonary exam normal breath sounds clear to auscultation       Cardiovascular Exercise Tolerance: Good METShypertension, Pt. on medications (-) CAD and (-) Past MI negative cardio ROS (-) dysrhythmias  Rhythm:Regular Rate:Normal - Systolic murmurs    Neuro/Psych  Headaches PSYCHIATRIC DISORDERS Anxiety Depression    negative neurological ROS     GI/Hepatic ,GERD  ,,(+)     (-) substance abuse  Partial SBO late August necessitating NG tube. Repeat imaging showed no obstruction, but enteritis/inflammation.  No vomiting for a week. NPO appropriate.   Endo/Other  neg diabetes    Renal/GU negative Renal ROS     Musculoskeletal   Abdominal  (+)  Abdomen: tender.   Peds  Hematology   Anesthesia Other Findings Past Medical History: No date: Anxiety No date: Complication of anesthesia     Comment:  wakes up crying No date: Depression No date: Endometriosis No date: Family history of adverse reaction to anesthesia     Comment:  hard to wake up, low heart rate 01/2012: Gestational hypertension     Comment:  also had postpartum preelampsia No date: History of stomach ulcers No date: Hx of ovarian cyst     Comment:  x 2-3 No date: Hx of varicella No date: IBS (irritable bowel syndrome) No date: Migraine     Comment:  chronic No date: Panic attack No date: Pinched nerve     Comment:  left arm 08/2012: Plica of knee      Comment:  medial plica left knee No date: SBO (small bowel obstruction) (HCC)  Reproductive/Obstetrics                              Anesthesia Physical Anesthesia Plan  ASA: 2  Anesthesia Plan: MAC   Post-op Pain Management: Minimal or no pain anticipated   Induction: Intravenous  PONV Risk Score and Plan: 2 and Propofol  infusion, TIVA and Ondansetron   Airway Management Planned: Nasal Cannula  Additional Equipment: None  Intra-op Plan:   Post-operative Plan:   Informed Consent: I have reviewed the patients History and Physical, chart, labs and discussed the procedure including the risks, benefits and alternatives for the proposed anesthesia with the patient or authorized representative who has indicated his/her understanding and acceptance.     Dental advisory given  Plan Discussed with: CRNA and Surgeon  Anesthesia Plan Comments: (Discussed risks of anesthesia with patient, including possibility of difficulty with spontaneous ventilation under anesthesia necessitating airway intervention, PONV, and rare risks such as cardiac or respiratory or neurological events, and allergic reactions. Discussed the role of CRNA in patient's perioperative care. Patient understands.)        Anesthesia Quick Evaluation

## 2024-08-21 NOTE — Discharge Summary (Signed)
 Physician Discharge Summary  [name] Sanchez FMW:[id number] DOB: 1996/05/06 DOA: 08/18/2024  PCP: Joshua Santana CROME, NP  Admit date: 08/18/2024 Discharge date: 08/21/24  Admitted From: Home Disposition: Home Recommendations for Outpatient Follow-up:  Outpatient follow-up with PCP and primary GI outpatient CMP and CBC at follow-up Please follow up on the following pending results: Fecal calprotectin  Home Health: No need identified Equipment/Devices: No need identified  Discharge Condition: Stable CODE STATUS: Full code Diet Orders (From admission, onward)     Start     Ordered   08/21/24 1108  Diet full liquid Room service appropriate? Yes; Fluid consistency: Thin  Diet effective now       Question Answer Comment  Room service appropriate? Yes   Fluid consistency: Thin      08/21/24 1107             Follow-up Information     Joshua Santana CROME, NP. Schedule an appointment as soon as possible for a visit in 1 week(s).   Specialty: Nurse Practitioner Contact information: [address] [place] [address] [phone]                 Hospital course 28 year old F with PMH of anxiety, depression, fibromyalgia, migraine headache, endometriosis and recent hospitalization from 8/22-8/26 for partial SBO for which she was treated with NG tube returning with abdominal pain for 3 to 4 days with associated nausea and vomiting.  Patient has had LLQ pain since that hospitalization.  She was seen by GI at [facility] on 9/9.  She had multiple imaging including KUB and CT abdomen and pelvis without explanation.  She tested positive for norovirus on 9/10.  She was prescribed Zofran  and low-dose Bentyl.   In ED, stable vitals.  CMP, lipase and CBC without significant finding.  UDS positive for opiate and marijuana.  CT angio abdomen and pelvis with no acute finding but 4.3 cm low-attenuation area in the right adnexa.  Pelvic ultrasound with normal right ovary.  Patient  continued to endorse nausea and abdominal pain.  CRP and ESR within normal.  GI consulted.  She underwent EGD which was unrevealing.  Concern about functional GI symptoms or IBS.  GI recommended advancing diet as tolerated, continuing Bentyl and as needed Zofran .  Also recommended outpatient follow-up with primary GI and considering addition of TCA, low FODMAP diet, outpatient enterography (CT vs MR) to further exclude Crohn's disease, given history of pSBO and family history of Crohn's disease.  See individual problem list below for more.   Problems addressed during this hospitalization Diffuse abdominal pain/nausea: Ongoing abdominal pain since partial SBO last month.  Associated nausea and vomiting.  Having bowel movements.  Blood work and imaging unrevealing.  Patient is concerned due to family history of GI malignancy (grandparent) and Crohn's (brother).  Also concerned about uptrending lipase although within normal.  Recently tested positive for norovirus but she does not think this is contributing.  Has history of anxiety, depression, fibromyalgia and migraine which also raises concern for functional etiology.  UDS positive for opiate and marijuana.  CRP and ESR within normal.  Exam with mild LUQ and LLQ tenderness.  No rebound or guarding.  EGD unrevealing  -GI recommendation as above. -Continue Protonix  and Pepcid  -Continue Zofran  as needed for nausea and vomiting -Reglan  for refractory nausea and vomiting.  Patient reports better relief with Reglan  in the past -Discontinue Phenergan . -Continue full liquid diet and slowly advance to soft -Follow-up fecal calprotectin -  GI recommendations as above.     Anxiety/depression/fibromyalgia -Continue home Cymbalta , gabapentin , Depakote    Migraine headache: Stable - Continue home Topamax  and Imitrex    Marijuana use - Encourage cessation   GERD - Continue home PPI and Pepcid .   Class II obesity Body mass index is 39.13 kg/m.            Consultations: Gastroenterology  Time spent 35  minutes  Vital signs Vitals:   08/21/24 1005 08/21/24 1010 08/21/24 1020 08/21/24 1031  BP: (!) 94/55 (!) 100/56 (!) 99/51 104/61  Pulse: 86 84 86 85  Temp: (!) 97.3 F (36.3 C)     Resp: 15 19 12 20   Height:      Weight:      SpO2: 100% 100% 100% 100%  TempSrc: Temporal     BMI (Calculated):         Discharge exam  GENERAL: No apparent distress.  Nontoxic. HEENT: MMM.  Vision and hearing grossly intact.  NECK: Supple.  No apparent JVD.  RESP:  No IWOB.  Fair aeration bilaterally. CVS:  RRR. Heart sounds normal.  ABD/GI/GU: BS+. Abd soft.  Mild tenderness on the left.  No rebound or guarding. MSK/EXT:  Moves extremities. No apparent deformity. No edema.  SKIN: no apparent skin lesion or wound NEURO: Awake and alert. Oriented appropriately.  No apparent focal neuro deficit. PSYCH: Calm. Normal affect.   Discharge Instructions Discharge Instructions     Discharge instructions   Complete by: As directed    It has been a pleasure taking care of you!  You were hospitalized due to nausea, vomiting and abdominal pain.  Unclear what is causing your symptoms.  You CT and endoscopy did not reveal any significant abnormality contributing to this.  Continue full liquid diet and slowly advance to soft diet.  Continue using your Zofran  as needed. We also gave you prescription for Reglan  if Zofran  doesn't work.  Follow-up with your primary care doctor and gastroenterologist   Take care,   Increase activity slowly   Complete by: As directed       Allergies as of 08/21/2024       Reactions   Compazine  [prochlorperazine ] Itching   Nurtec [rimegepant Sulfate ] Other (See Comments)   GI intolerance    Percocet [oxycodone -acetaminophen ] Hives, Itching   Ceftin [cefuroxime] Rash   Penicillins Rash        Medication List     STOP taking these medications    promethazine  12.5 MG tablet Commonly known as: PHENERGAN         TAKE these medications    acetaminophen  650 MG CR tablet Commonly known as: TYLENOL  Take 1,300 mg by mouth 2 (two) times daily as needed for pain.   Ajovy 225 MG/1.5ML Soaj Generic drug: Fremanezumab-vfrm Inject 225 mg into the skin every 28 (twenty-eight) days.   ALPRAZolam  0.5 MG tablet Commonly known as: XANAX  Take 1 tablet (0.5 mg total) by mouth daily as needed for anxiety.   dicyclomine 10 MG capsule Commonly known as: BENTYL Take 10 mg by mouth 4 (four) times daily -  before meals and at bedtime.   divalproex  500 MG DR tablet Commonly known as: DEPAKOTE  Take 500 mg by mouth See admin instructions. Take 1 tablet (500mg ) by mouth for 3-5 days as needed to break headache cycle.   DULoxetine  20 MG capsule Commonly known as: CYMBALTA  Take 40 mg by mouth daily.   gabapentin  300 MG capsule Commonly known as: NEURONTIN  Take 300 mg by mouth  2 (two) times daily.   Gas-X Extra Strength 125 MG chewable tablet Generic drug: simethicone  Chew 125 mg by mouth every 6 (six) hours as needed for flatulence (stomach pain, bloating). What changed: Another medication with the same name was removed. Continue taking this medication, and follow the directions you see here.   ketorolac  60 MG/2ML Soln injection Commonly known as: TORADOL  Inject 60 mg into the muscle once as needed (severe migraine).   levocetirizine 5 MG tablet Commonly known as: XYZAL  Take 5 mg by mouth at bedtime.   MAGNESIUM  PO Take 2 tablets by mouth at bedtime.   metoCLOPramide  5 MG tablet Commonly known as: Reglan  Take 1 tablet (5 mg total) by mouth every 6 (six) hours as needed for nausea or vomiting.   ondansetron  4 MG disintegrating tablet Commonly known as: ZOFRAN -ODT Take 4 mg by mouth every 8 (eight) hours as needed for nausea or vomiting.   pantoprazole  40 MG tablet Commonly known as: PROTONIX  Take 40 mg by mouth daily.   Pepcid  Complete 10-800-165 MG chewable tablet Generic drug:  famotidine -calcium carbonate-magnesium  hydroxide Chew 1 tablet by mouth 2 (two) times daily as needed (indigestion, stomach pain).   polyethylene glycol 17 g packet Commonly known as: MiraLax  Take 17 g by mouth daily as needed. What changed: when to take this   tiZANidine  4 MG tablet Commonly known as: Zanaflex  Take 1 tablet (4 mg total) by mouth every 6 (six) hours as needed for muscle spasms. Or headaches.   topiramate  100 MG tablet Commonly known as: TOPAMAX  Take 100 mg by mouth 2 (two) times daily.   Ubrelvy  100 MG Tabs Generic drug: Ubrogepant  Take 100 mg by mouth every 2 (two) hours as needed.         Procedures/Studies: 9/17-EGD normal.   DG Abd 1 View Result Date: 08/20/2024 CLINICAL DATA:  Abdominal distension EXAM: ABDOMEN - 1 VIEW COMPARISON:  CT from 08/18/2024 FINDINGS: Scattered large and small bowel gas is noted. No obstructive changes are seen. No free air is noted. No bony abnormality is seen. IMPRESSION: No acute abnormality noted. Electronically Signed   By: Oneil Devonshire M.D.   On: 08/20/2024 00:38   US  PELVIC COMPLETE W TRANSVAGINAL AND TORSION R/O Result Date: 08/18/2024 CLINICAL DATA:  Nausea and left lower quadrant abdominal pain. CT today showed a 0.3 cm right adnexal lesion. Prior vaginal hysterectomy and bilateral salpingectomy. EXAM: TRANSABDOMINAL AND TRANSVAGINAL ULTRASOUND OF PELVIS DOPPLER ULTRASOUND OF OVARIES TECHNIQUE: Both transabdominal and transvaginal ultrasound examinations of the pelvis were performed. Transabdominal technique was performed for global imaging of the pelvis including uterus, ovaries, adnexal regions, and pelvic cul-de-sac. It was necessary to proceed with endovaginal exam following the transabdominal exam to visualize the ovaries. Color and duplex Doppler ultrasound was utilized to evaluate blood flow to the ovaries. COMPARISON:  CT scan 08/18/2024 FINDINGS: Uterus Surgically absent Endometrium N/A Right ovary Measurements: 3.1  by 1.8 by 2.2 cm = volume: 6.6 mL. Normal appearance/no adnexal mass. Left ovary Measurements: 2.9 by 1.4 by 2.0 cm = volume: 4.4 mL. Normal appearance/no adnexal mass. Pulsed Doppler evaluation of both ovaries demonstrates normal low-resistance arterial and venous waveforms in the right ovary. Color Doppler in venous flow could be seen in the left ovary but arterial flow was not well as stab list due to artifact from adjacent bowel. Other findings No abnormal free fluid. IMPRESSION: 1. The ovaries appear normal in size and appearance. No adnexal mass identified. 2. Arterial and venous flow is identified in the  right ovary. Venous flow is identified in the left ovary but arterial flow was not well seen due to artifact from adjacent bowel. 3. Based on retrospective review in the context of sonographic findings, the area of concern along the right adnexa raised on prior CT scan is thought to represent normal cecum. 4. Uterus absent. Electronically Signed   By: Ryan Salvage M.D.   On: 08/18/2024 17:26   CT Angio Abd/Pel W and/or Wo Contrast Result Date: 08/18/2024 CLINICAL DATA:  Acute mesenteric ischemia. EXAM: CTA ABDOMEN AND PELVIS WITHOUT AND WITH CONTRAST TECHNIQUE: Multidetector CT imaging of the abdomen and pelvis was performed using the standard protocol during bolus administration of intravenous contrast. Multiplanar reconstructed images and MIPs were obtained and reviewed to evaluate the vascular anatomy. RADIATION DOSE REDUCTION: This exam was performed according to the departmental dose-optimization program which includes automated exposure control, adjustment of the mA and/or kV according to patient size and/or use of iterative reconstruction technique. CONTRAST:  OMNIPAQUE  IOHEXOL  350 MG/ML SOLN COMPARISON:  CT abdomen and pelvis 08/09/2024. FINDINGS: VASCULAR Aorta: Normal caliber aorta without aneurysm, dissection, vasculitis or significant stenosis. Celiac: Patent without evidence of  aneurysm, dissection, vasculitis or significant stenosis. SMA: Patent without evidence of aneurysm, dissection, vasculitis or significant stenosis. Renals: Both renal arteries are patent without evidence of aneurysm, dissection, vasculitis, fibromuscular dysplasia or significant stenosis. IMA: Patent without evidence of aneurysm, dissection, vasculitis or significant stenosis. Inflow: Patent without evidence of aneurysm, dissection, vasculitis or significant stenosis. Proximal Outflow: Bilateral common femoral and visualized portions of the superficial and profunda femoral arteries are patent without evidence of aneurysm, dissection, vasculitis or significant stenosis. Veins: No obvious venous abnormality within the limitations of this arterial phase study. The portal venous system including the superior mesenteric vein appear grossly patent. Review of the MIP images confirms the above findings. NON-VASCULAR Lower chest: No acute abnormality. Hepatobiliary: No focal liver abnormality is seen. No gallstones, gallbladder wall thickening, or biliary dilatation. Pancreas: Unremarkable. No pancreatic ductal dilatation or surrounding inflammatory changes. Spleen: Normal in size without focal abnormality. Adrenals/Urinary Tract: Adrenal glands are unremarkable. Kidneys are normal, without renal calculi, focal lesion, or hydronephrosis. Bladder is unremarkable. Stomach/Bowel: Stomach is within normal limits. Appendix appears normal. No evidence of bowel wall thickening, distention, or inflammatory changes. No portal venous gas. Lymphatic: No enlarged lymph nodes are seen. Reproductive: The uterus is surgically absent. Left ovary is within normal limits. There is a 4.3 by 3.7 cm low-attenuation area in the right adnexa, possibly paraovarian cyst. Separate right ovary seen posterior to this and appears normal in size. Other: No abdominal wall hernia or abnormality. No abdominopelvic ascites. Musculoskeletal: No acute or  significant osseous findings. IMPRESSION: 1. No evidence for mesenteric ischemia. 2. No acute localizing process in the abdomen or pelvis. 3. 4.3 cm low-attenuation area in the right adnexa, possibly paraovarian cyst. Recommend further evaluation with pelvic ultrasound. Electronically Signed   By: Greig Pique M.D.   On: 08/18/2024 15:20   CT L-SPINE NO CHARGE Result Date: 08/18/2024 EXAM: CT OF THE LUMBAR SPINE WITHOUT CONTRAST 08/18/2024 02:21:16 PM TECHNIQUE: CT of the lumbar spine was performed without the administration of intravenous contrast. Multiplanar reformatted images are provided for review. Automated exposure control, iterative reconstruction, and/or weight based adjustment of the mA/kV was utilized to reduce the radiation dose to as low as reasonably achievable. COMPARISON: Lumbar spine radiographs 11/30/2009. CLINICAL HISTORY: Left-sided abdominal pain. FINDINGS: BONES AND ALIGNMENT: Normal vertebral body heights. No acute fracture or suspicious bone  lesion. Normal alignment. DEGENERATIVE CHANGES: Moderate facet hypertrophy is present on the left at L5 to S1. No significant disc protrusion or stenosis is present. SOFT TISSUES: No acute abnormality. IMPRESSION: 1. No acute findings. 2. Moderate facet hypertrophy on the left at L5-S1 without focal stenosis Electronically signed by: Lonni Necessary MD 08/18/2024 03:12 PM EDT RP Workstation: [id number]   CT ABDOMEN PELVIS W CONTRAST Result Date: 08/09/2024 CLINICAL DATA:  Lower abdominal pain. Prior small bowel obstruction. Vomiting. EXAM: CT ABDOMEN AND PELVIS WITH CONTRAST TECHNIQUE: Multidetector CT imaging of the abdomen and pelvis was performed using the standard protocol following bolus administration of intravenous contrast. RADIATION DOSE REDUCTION: This exam was performed according to the departmental dose-optimization program which includes automated exposure control, adjustment of the mA and/or kV according to patient size and/or use  of iterative reconstruction technique. CONTRAST:  OMNIPAQUE  IOHEXOL  300 MG/ML  SOLN COMPARISON:  Radiographs 07/29/2024 and CT scan 07/31/2024 FINDINGS: Lower chest: Unremarkable Hepatobiliary: Focal steatosis in segment 4 adjacent to the falciform ligament. This appearance is benign and no further imaging workup is indicated. Gallbladder unremarkable. Pancreas: Unremarkable Spleen: Unremarkable Adrenals/Urinary Tract: Unremarkable Stomach/Bowel: No dilated bowel to indicate obstruction. There are some air-fluid levels in normal caliber loops of otherwise fluid-filled distal small bowel. Some of these loops sits adjacent to the right ovary. Normal caliber appendix adjacent to the right ureter on image 62 series 2. Vascular/Lymphatic: Unremarkable Reproductive: Uterus absent. Suspected corpus luteum in the right ovary measuring 1.8 cm in diameter. Right ovary margins partially obscured by adjacent fluid-filled loops of bowel. Left ovary unremarkable. Other: No supplemental non-categorized findings. Musculoskeletal: Unremarkable IMPRESSION: 1. No dilated bowel to indicate obstruction. There are some air-fluid levels in normal caliber fluid-filled loops of distal small bowel, which could be from a low-grade enteritis. Electronically Signed   By: Ryan Salvage M.D.   On: 08/09/2024 14:22   DG Abd 1 View Result Date: 07/29/2024 CLINICAL DATA:  Small bowel obstruction. EXAM: ABDOMEN - 1 VIEW COMPARISON:  07/27/2024. FINDINGS: Oral contrast has advanced within the colon, now within the transverse, descending and rectosigmoid colon. No small bowel dilatation. Nasogastric tube terminates near the pylorus. IMPRESSION: Continued transit of oral contrast within the colon. No small bowel obstruction. Electronically Signed   By: Newell Eke M.D.   On: 07/29/2024 11:54   DG Abd Portable 1V-Small Bowel Obstruction Protocol-initial, 8 hr delay Result Date: 07/27/2024 CLINICAL DATA:  Small bowel obstruction  protocol. 8 hour delayed film EXAM: PORTABLE ABDOMEN - 1 VIEW COMPARISON:  07/27/2024 FINDINGS: NG tube tip is in the distal stomach. No gaseous small bowel dilatation to suggest obstruction. Contrast material is seen along the entire length of a nondilated colon. Contrast material in the bladder is consistent with excretion of intravenous contrast material from yesterday's CT scan. IMPRESSION: No gaseous small bowel dilatation to suggest obstruction. Contrast material is seen along the entire length of a nondilated colon compatible with transit through the small bowel. Electronically Signed   By: Camellia Candle M.D.   On: 07/27/2024 12:03   DG Abd Portable 1V-Small Bowel Protocol-Position Verification Result Date: 07/27/2024 CLINICAL DATA:  Check the NGT insertion. EXAM: PORTABLE ABDOMEN - 1 VIEW COMPARISON:  CT with IV contrast yesterday at 4:56 p.m. FINDINGS: 12:40 a.m. NGT is well placed with the tip in distal gastric body. The bowel pattern appears nonobstructive. There is contrast in the renal collecting systems and bladder. No calculus or other significant radiographic findings. IMPRESSION: NGT is well placed with the  tip in the distal gastric body. Electronically Signed   By: Francis Quam M.D.   On: 07/27/2024 00:53   CT ABDOMEN PELVIS W CONTRAST Result Date: 07/26/2024 CLINICAL DATA:  3 day history of worsening left-sided abdominal pain. EXAM: CT ABDOMEN AND PELVIS WITH CONTRAST TECHNIQUE: Multidetector CT imaging of the abdomen and pelvis was performed using the standard protocol following bolus administration of intravenous contrast. RADIATION DOSE REDUCTION: This exam was performed according to the departmental dose-optimization program which includes automated exposure control, adjustment of the mA and/or kV according to patient size and/or use of iterative reconstruction technique. CONTRAST:  OMNIPAQUE  IOHEXOL  300 MG/ML  SOLN COMPARISON:  Pelvic ultrasound earlier same day. Abdomen pelvis  CT 03/30/2022 FINDINGS: Lower chest: No acute findings. Hepatobiliary: No suspicious focal abnormality within the liver parenchyma. Small area of low attenuation in the anterior liver, adjacent to the falciform ligament, is in a characteristic location for focal fatty deposition. There is no evidence for gallstones, gallbladder wall thickening, or pericholecystic fluid. No intrahepatic or extrahepatic biliary dilation. Pancreas: No focal mass lesion. No dilatation of the main duct. No intraparenchymal cyst. No peripancreatic edema. Spleen: No splenomegaly. No suspicious focal mass lesion. Adrenals/Urinary Tract: No adrenal nodule or mass. Kidneys unremarkable. No evidence for hydroureter. The urinary bladder appears normal for the degree of distention. Stomach/Bowel: Stomach is unremarkable. No gastric wall thickening. No evidence of outlet obstruction. Duodenum is normally positioned as is the ligament of Treitz. Proximal small bowel is nondistended. Small bowel loops in the pelvis are fluid-filled and distended up to a bowel 2.4 cm maximum diameter. There is clustered small bowel the central pelvis (see axial 67/2) with a configuration raising the question of internal hernia although this possibility seems less likely on coronal and sagittal imaging. There is a small bowel adhesion in the right pelvis (59/2 axial and 65/4 coronal which may result in a component of small-bowel obstruction as distal ileal loops extending into the terminal ileum are completely decompressed. The appendix is best seen on coronal images and is unremarkable. Vascular/Lymphatic: No abdominal aortic aneurysm. No abdominal aortic atherosclerotic calcification. There is no gastrohepatic or hepatoduodenal ligament lymphadenopathy. No retroperitoneal or mesenteric lymphadenopathy. No pelvic sidewall lymphadenopathy. Reproductive: Uterus surgically absent. Left ovary unremarkable. Right ovary is position along the medial wall the cecum without  a discrete ovarian mass lesion evident. Other: No intraperitoneal free fluid. Musculoskeletal: No worrisome lytic or sclerotic osseous abnormality. IMPRESSION: 1. A cluster small bowel loops in the pelvis are fluid-filled and distended up to a bowel 2.4 cm maximum diameter. There is a small bowel adhesion in the right pelvis which may result in a component of small-bowel stricture or partial obstruction as distal ileal loops extending into the terminal ileum are completely decompressed. No bowel wall thickening or pneumatosis. No intraperitoneal free fluid. 2. Appendix unremarkable. 3. No evidence for adnexal mass. Electronically Signed   By: Camellia Candle M.D.   On: 07/26/2024 17:14   US  Pelvis Complete Result Date: 07/26/2024 CLINICAL DATA:  LLQ pain. EXAM: TRANSABDOMINAL AND TRANSVAGINAL ULTRASOUND OF PELVIS DOPPLER ULTRASOUND OF OVARIES TECHNIQUE: Both transabdominal and transvaginal ultrasound examinations of the pelvis were performed. Transabdominal technique was performed for global imaging of the pelvis including uterus, ovaries, adnexal regions, and pelvic cul-de-sac. It was necessary to proceed with endovaginal exam following the transabdominal exam to visualize the bilateral ovaries and bilateral adnexa. Color and duplex Doppler ultrasound was utilized to evaluate blood flow to the ovaries. COMPARISON:  None Available. FINDINGS: Uterus  Surgically absent. Right ovary Measurements: 2.3 x 2.6 x 3.7 cm = volume: 11.6 mL. Normal appearance/no adnexal mass. Left ovary Measurements: 2.0 x 2.4 x 3.8 cm = volume: 9.7 mL. Normal appearance/no adnexal mass. Pulsed Doppler evaluation of both ovaries demonstrates normal low-resistance arterial and venous waveforms. Other findings No abnormal free fluid. IMPRESSION: Surgically absent uterus. Otherwise unremarkable exam. Electronically Signed   By: Ree Molt M.D.   On: 07/26/2024 15:21   US  Transvaginal Non-OB Result Date: 07/26/2024 CLINICAL DATA:  LLQ pain.  EXAM: TRANSABDOMINAL AND TRANSVAGINAL ULTRASOUND OF PELVIS DOPPLER ULTRASOUND OF OVARIES TECHNIQUE: Both transabdominal and transvaginal ultrasound examinations of the pelvis were performed. Transabdominal technique was performed for global imaging of the pelvis including uterus, ovaries, adnexal regions, and pelvic cul-de-sac. It was necessary to proceed with endovaginal exam following the transabdominal exam to visualize the bilateral ovaries and bilateral adnexa. Color and duplex Doppler ultrasound was utilized to evaluate blood flow to the ovaries. COMPARISON:  None Available. FINDINGS: Uterus Surgically absent. Right ovary Measurements: 2.3 x 2.6 x 3.7 cm = volume: 11.6 mL. Normal appearance/no adnexal mass. Left ovary Measurements: 2.0 x 2.4 x 3.8 cm = volume: 9.7 mL. Normal appearance/no adnexal mass. Pulsed Doppler evaluation of both ovaries demonstrates normal low-resistance arterial and venous waveforms. Other findings No abnormal free fluid. IMPRESSION: Surgically absent uterus. Otherwise unremarkable exam. Electronically Signed   By: Ree Molt M.D.   On: 07/26/2024 15:21   US  Art/Ven Flow Abd Pelv Doppler Result Date: 07/26/2024 CLINICAL DATA:  LLQ pain. EXAM: TRANSABDOMINAL AND TRANSVAGINAL ULTRASOUND OF PELVIS DOPPLER ULTRASOUND OF OVARIES TECHNIQUE: Both transabdominal and transvaginal ultrasound examinations of the pelvis were performed. Transabdominal technique was performed for global imaging of the pelvis including uterus, ovaries, adnexal regions, and pelvic cul-de-sac. It was necessary to proceed with endovaginal exam following the transabdominal exam to visualize the bilateral ovaries and bilateral adnexa. Color and duplex Doppler ultrasound was utilized to evaluate blood flow to the ovaries. COMPARISON:  None Available. FINDINGS: Uterus Surgically absent. Right ovary Measurements: 2.3 x 2.6 x 3.7 cm = volume: 11.6 mL. Normal appearance/no adnexal mass. Left ovary Measurements: 2.0 x  2.4 x 3.8 cm = volume: 9.7 mL. Normal appearance/no adnexal mass. Pulsed Doppler evaluation of both ovaries demonstrates normal low-resistance arterial and venous waveforms. Other findings No abnormal free fluid. IMPRESSION: Surgically absent uterus. Otherwise unremarkable exam. Electronically Signed   By: Ree Molt M.D.   On: 07/26/2024 15:21       The results of significant diagnostics from this hospitalization (including imaging, microbiology, ancillary and laboratory) are listed below for reference.     Microbiology: No results found for this or any previous visit (from the past 240 hours).   Labs:  CBC: Recent Labs  Lab 08/18/24 1109 08/19/24 1015  WBC 5.0 4.5  HGB 12.7 12.2  HCT 37.4 36.5  MCV 91.4 94.1  PLT 220 209   BMP &GFR Recent Labs  Lab 08/18/24 1109 08/19/24 1015  NA 139  --   K 3.6  --   CL 105  --   CO2 20*  --   GLUCOSE 95  --   BUN 11  --   CREATININE 0.95 0.93  CALCIUM 9.9  --    Estimated Creatinine Clearance: 104.6 mL/min (by C-G formula based on SCr of 0.93 mg/dL). Liver & Pancreas: Recent Labs  Lab 08/18/24 1109  AST 18  ALT 17  ALKPHOS 61  BILITOT 0.4  PROT 7.4  ALBUMIN 4.4  Recent Labs  Lab 08/18/24 1109 08/19/24 2256  LIPASE 42 48   No results for input(s): AMMONIA in the last 168 hours. Diabetic: No results for input(s): HGBA1C in the last 72 hours. No results for input(s): GLUCAP in the last 168 hours. Cardiac Enzymes: No results for input(s): CKTOTAL, CKMB, CKMBINDEX, TROPONINI in the last 168 hours. No results for input(s): PROBNP in the last 8760 hours. Coagulation Profile: No results for input(s): INR, PROTIME in the last 168 hours. Thyroid Function Tests: No results for input(s): TSH, T4TOTAL, FREET4, T3FREE, THYROIDAB in the last 72 hours. Lipid Profile: No results for input(s): CHOL, HDL, LDLCALC, TRIG, CHOLHDL, LDLDIRECT in the last 72 hours. Anemia Panel: No  results for input(s): VITAMINB12, FOLATE, FERRITIN, TIBC, IRON, RETICCTPCT in the last 72 hours. Urine analysis:    Component Value Date/Time   COLORURINE COLORLESS (A) 08/18/2024 1355   APPEARANCEUR CLEAR 08/18/2024 1355   LABSPEC 1.006 08/18/2024 1355   PHURINE 6.5 08/18/2024 1355   GLUCOSEU NEGATIVE 08/18/2024 1355   GLUCOSEU NEGATIVE 04/04/2012 1410   HGBUR NEGATIVE 08/18/2024 1355   BILIRUBINUR NEGATIVE 08/18/2024 1355   BILIRUBINUR negative 09/19/2018 1404   KETONESUR 15 (A) 08/18/2024 1355   PROTEINUR NEGATIVE 08/18/2024 1355   UROBILINOGEN 0.2 09/19/2018 1404   UROBILINOGEN 0.2 04/17/2014 2157   NITRITE NEGATIVE 08/18/2024 1355   LEUKOCYTESUR NEGATIVE 08/18/2024 1355   Sepsis Labs: Invalid input(s): PROCALCITONIN, LACTICIDVEN   SIGNED:  Jamicah Anstead T Argusta Mcgann, MD  [facility] 08/21/2024, 5:22 PM

## 2024-08-22 LAB — CALPROTECTIN, FECAL: Calprotectin, Fecal: 51 ug/g (ref 0–120)

## 2024-08-22 LAB — SURGICAL PATHOLOGY

## 2024-08-28 ENCOUNTER — Ambulatory Visit: Payer: Self-pay | Admitting: Gastroenterology

## 2024-08-28 NOTE — Progress Notes (Signed)
 [name] Sanchez,  The biopsies taken of your duodenum and stomach were unremarkable. There was no evidence of celiac disease and no evidence of H. pylori infection that would explain your symptoms.  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  I hope your symptoms improve.
